# Patient Record
Sex: Female | Born: 1935
Health system: Southern US, Community
[De-identification: ages and names within clinical notes are randomized; demographics above are authoritative.]

## PROBLEM LIST (undated history)

## (undated) DIAGNOSIS — M1990 Unspecified osteoarthritis, unspecified site: Secondary | ICD-10-CM

## (undated) DIAGNOSIS — R32 Unspecified urinary incontinence: Secondary | ICD-10-CM

## (undated) DIAGNOSIS — R112 Nausea with vomiting, unspecified: Secondary | ICD-10-CM

## (undated) DIAGNOSIS — I219 Acute myocardial infarction, unspecified: Secondary | ICD-10-CM

## (undated) DIAGNOSIS — R3915 Urgency of urination: Secondary | ICD-10-CM

## (undated) DIAGNOSIS — I10 Essential (primary) hypertension: Secondary | ICD-10-CM

## (undated) DIAGNOSIS — M2550 Pain in unspecified joint: Secondary | ICD-10-CM

## (undated) DIAGNOSIS — C801 Malignant (primary) neoplasm, unspecified: Secondary | ICD-10-CM

## (undated) DIAGNOSIS — R51 Headache: Secondary | ICD-10-CM

## (undated) DIAGNOSIS — E785 Hyperlipidemia, unspecified: Secondary | ICD-10-CM

## (undated) DIAGNOSIS — Z9889 Other specified postprocedural states: Secondary | ICD-10-CM

## (undated) DIAGNOSIS — J189 Pneumonia, unspecified organism: Secondary | ICD-10-CM

## (undated) DIAGNOSIS — M2540 Effusion, unspecified joint: Secondary | ICD-10-CM

## (undated) DIAGNOSIS — G8929 Other chronic pain: Secondary | ICD-10-CM

## (undated) DIAGNOSIS — A499 Bacterial infection, unspecified: Secondary | ICD-10-CM

## (undated) DIAGNOSIS — R351 Nocturia: Secondary | ICD-10-CM

## (undated) DIAGNOSIS — Z8669 Personal history of other diseases of the nervous system and sense organs: Secondary | ICD-10-CM

## (undated) DIAGNOSIS — R0602 Shortness of breath: Secondary | ICD-10-CM

## (undated) DIAGNOSIS — K219 Gastro-esophageal reflux disease without esophagitis: Secondary | ICD-10-CM

## (undated) DIAGNOSIS — M549 Dorsalgia, unspecified: Secondary | ICD-10-CM

## (undated) DIAGNOSIS — R600 Localized edema: Secondary | ICD-10-CM

## (undated) DIAGNOSIS — R350 Frequency of micturition: Secondary | ICD-10-CM

## (undated) HISTORY — PX: BLEPHAROPLASTY: SUR158

## (undated) HISTORY — DX: Essential (primary) hypertension: I10

## (undated) HISTORY — DX: Hyperlipidemia, unspecified: E78.5

## (undated) HISTORY — DX: Unspecified urinary incontinence: R32

## (undated) HISTORY — PX: TONSILLECTOMY: SUR1361

## (undated) HISTORY — DX: Unspecified osteoarthritis, unspecified site: M19.90

## (undated) HISTORY — DX: Localized edema: R60.0

## (undated) HISTORY — PX: ABDOMINAL HYSTERECTOMY: SHX81

## (undated) HISTORY — PX: KNEE SURGERY: SHX244

---

## 1996-01-08 HISTORY — PX: OTHER SURGICAL HISTORY: SHX169

## 1996-01-08 HISTORY — PX: OOPHORECTOMY: SHX86

## 1997-12-11 ENCOUNTER — Emergency Department (HOSPITAL_COMMUNITY): Admission: EM | Admit: 1997-12-11 | Discharge: 1997-12-11 | Payer: Self-pay | Admitting: Emergency Medicine

## 1999-12-21 ENCOUNTER — Ambulatory Visit (HOSPITAL_COMMUNITY): Admission: RE | Admit: 1999-12-21 | Discharge: 1999-12-21 | Payer: Self-pay | Admitting: *Deleted

## 2000-01-14 ENCOUNTER — Ambulatory Visit (HOSPITAL_COMMUNITY): Admission: RE | Admit: 2000-01-14 | Discharge: 2000-01-14 | Payer: Self-pay | Admitting: *Deleted

## 2000-02-06 ENCOUNTER — Ambulatory Visit (HOSPITAL_BASED_OUTPATIENT_CLINIC_OR_DEPARTMENT_OTHER): Admission: RE | Admit: 2000-02-06 | Discharge: 2000-02-06 | Payer: Self-pay | Admitting: Orthopedic Surgery

## 2000-04-21 ENCOUNTER — Emergency Department (HOSPITAL_COMMUNITY): Admission: EM | Admit: 2000-04-21 | Discharge: 2000-04-21 | Payer: Self-pay | Admitting: Internal Medicine

## 2003-06-23 ENCOUNTER — Emergency Department (HOSPITAL_COMMUNITY): Admission: EM | Admit: 2003-06-23 | Discharge: 2003-06-23 | Payer: Self-pay | Admitting: Emergency Medicine

## 2005-02-10 ENCOUNTER — Encounter: Admission: RE | Admit: 2005-02-10 | Discharge: 2005-02-10 | Payer: Self-pay | Admitting: Orthopedic Surgery

## 2009-10-02 ENCOUNTER — Inpatient Hospital Stay (HOSPITAL_COMMUNITY): Admission: RE | Admit: 2009-10-02 | Discharge: 2009-10-05 | Payer: Self-pay | Admitting: Orthopedic Surgery

## 2009-10-02 HISTORY — PX: JOINT REPLACEMENT: SHX530

## 2009-10-30 ENCOUNTER — Encounter
Admission: RE | Admit: 2009-10-30 | Discharge: 2009-12-26 | Payer: Self-pay | Source: Home / Self Care | Attending: Orthopedic Surgery | Admitting: Orthopedic Surgery

## 2010-03-22 LAB — BASIC METABOLIC PANEL
BUN: 11 mg/dL (ref 6–23)
BUN: 8 mg/dL (ref 6–23)
BUN: 9 mg/dL (ref 6–23)
CO2: 28 mEq/L (ref 19–32)
CO2: 32 mEq/L (ref 19–32)
CO2: 32 mEq/L (ref 19–32)
Calcium: 8.1 mg/dL — ABNORMAL LOW (ref 8.4–10.5)
Calcium: 8.4 mg/dL (ref 8.4–10.5)
Calcium: 8.4 mg/dL (ref 8.4–10.5)
Chloride: 101 mEq/L (ref 96–112)
Chloride: 92 mEq/L — ABNORMAL LOW (ref 96–112)
Chloride: 95 mEq/L — ABNORMAL LOW (ref 96–112)
Creatinine, Ser: 0.97 mg/dL (ref 0.4–1.2)
Creatinine, Ser: 1.07 mg/dL (ref 0.4–1.2)
Creatinine, Ser: 1.15 mg/dL (ref 0.4–1.2)
GFR calc Af Amer: 56 mL/min — ABNORMAL LOW (ref >60)
GFR calc Af Amer: >60 mL/min (ref >60)
GFR calc Af Amer: >60 mL/min (ref >60)
GFR calc non Af Amer: 46 mL/min — ABNORMAL LOW (ref >60)
GFR calc non Af Amer: 50 mL/min — ABNORMAL LOW (ref >60)
GFR calc non Af Amer: 56 mL/min — ABNORMAL LOW (ref >60)
Glucose, Bld: 105 mg/dL — ABNORMAL HIGH (ref 70–99)
Glucose, Bld: 110 mg/dL — ABNORMAL HIGH (ref 70–99)
Glucose, Bld: 119 mg/dL — ABNORMAL HIGH (ref 70–99)
Potassium: 3.4 mEq/L — ABNORMAL LOW (ref 3.5–5.1)
Potassium: 3.5 mEq/L (ref 3.5–5.1)
Potassium: 3.7 mEq/L (ref 3.5–5.1)
Sodium: 131 mEq/L — ABNORMAL LOW (ref 135–145)
Sodium: 134 mEq/L — ABNORMAL LOW (ref 135–145)
Sodium: 136 mEq/L (ref 135–145)

## 2010-03-22 LAB — URINE CULTURE
Colony Count: NO GROWTH
Culture  Setup Time: 201109210956
Culture: NO GROWTH

## 2010-03-22 LAB — DIFFERENTIAL
Basophils Absolute: 0 10*3/uL (ref 0.0–0.1)
Basophils Relative: 0 % (ref 0–1)
Eosinophils Absolute: 0.2 10*3/uL (ref 0.0–0.7)
Eosinophils Relative: 2 % (ref 0–5)
Lymphocytes Relative: 17 % (ref 12–46)
Lymphs Abs: 1.8 10*3/uL (ref 0.7–4.0)
Monocytes Absolute: 0.7 10*3/uL (ref 0.1–1.0)
Monocytes Relative: 6 % (ref 3–12)
Neutro Abs: 8.2 10*3/uL — ABNORMAL HIGH (ref 1.7–7.7)
Neutrophils Relative %: 75 % (ref 43–77)

## 2010-03-22 LAB — CROSSMATCH
ABO/RH(D): A POS
Antibody Screen: NEGATIVE

## 2010-03-22 LAB — URINALYSIS, ROUTINE W REFLEX MICROSCOPIC
Bilirubin Urine: NEGATIVE
Glucose, UA: NEGATIVE mg/dL
Ketones, ur: NEGATIVE mg/dL
Leukocytes, UA: NEGATIVE
Nitrite: NEGATIVE
Protein, ur: NEGATIVE mg/dL
Specific Gravity, Urine: 1.014 (ref 1.005–1.030)
Urobilinogen, UA: 0.2 mg/dL (ref 0.0–1.0)
pH: 6.5 (ref 5.0–8.0)

## 2010-03-22 LAB — SURGICAL PCR SCREEN
MRSA, PCR: NEGATIVE
Staphylococcus aureus: POSITIVE — AB

## 2010-03-22 LAB — CBC
HCT: 27.5 % — ABNORMAL LOW (ref 36.0–46.0)
HCT: 30.7 % — ABNORMAL LOW (ref 36.0–46.0)
HCT: 30.7 % — ABNORMAL LOW (ref 36.0–46.0)
HCT: 39.7 % (ref 36.0–46.0)
Hemoglobin: 10.1 g/dL — ABNORMAL LOW (ref 12.0–15.0)
Hemoglobin: 10.2 g/dL — ABNORMAL LOW (ref 12.0–15.0)
Hemoglobin: 13.2 g/dL (ref 12.0–15.0)
Hemoglobin: 9.2 g/dL — ABNORMAL LOW (ref 12.0–15.0)
MCH: 29.3 pg (ref 26.0–34.0)
MCH: 30.2 pg (ref 26.0–34.0)
MCH: 30.3 pg (ref 26.0–34.0)
MCH: 30.4 pg (ref 26.0–34.0)
MCHC: 32.9 g/dL (ref 30.0–36.0)
MCHC: 33.2 g/dL (ref 30.0–36.0)
MCHC: 33.2 g/dL (ref 30.0–36.0)
MCHC: 33.5 g/dL (ref 30.0–36.0)
MCV: 89 fL (ref 78.0–100.0)
MCV: 90.8 fL (ref 78.0–100.0)
MCV: 90.8 fL (ref 78.0–100.0)
MCV: 91.1 fL (ref 78.0–100.0)
Platelets: 203 10*3/uL (ref 150–400)
Platelets: 216 10*3/uL (ref 150–400)
Platelets: 237 10*3/uL (ref 150–400)
Platelets: 323 10*3/uL (ref 150–400)
RBC: 3.03 MIL/uL — ABNORMAL LOW (ref 3.87–5.11)
RBC: 3.38 MIL/uL — ABNORMAL LOW (ref 3.87–5.11)
RBC: 3.45 MIL/uL — ABNORMAL LOW (ref 3.87–5.11)
RBC: 4.36 MIL/uL (ref 3.87–5.11)
RDW: 13.3 % (ref 11.5–15.5)
RDW: 13.4 % (ref 11.5–15.5)
RDW: 13.5 % (ref 11.5–15.5)
RDW: 13.5 % (ref 11.5–15.5)
WBC: 10.9 10*3/uL — ABNORMAL HIGH (ref 4.0–10.5)
WBC: 12.9 10*3/uL — ABNORMAL HIGH (ref 4.0–10.5)
WBC: 13.1 10*3/uL — ABNORMAL HIGH (ref 4.0–10.5)
WBC: 14.8 10*3/uL — ABNORMAL HIGH (ref 4.0–10.5)

## 2010-03-22 LAB — COMPREHENSIVE METABOLIC PANEL
ALT: 17 U/L (ref 0–35)
AST: 20 U/L (ref 0–37)
Albumin: 3.7 g/dL (ref 3.5–5.2)
Alkaline Phosphatase: 98 U/L (ref 39–117)
BUN: 19 mg/dL (ref 6–23)
CO2: 27 mEq/L (ref 19–32)
Calcium: 9.2 mg/dL (ref 8.4–10.5)
Chloride: 104 mEq/L (ref 96–112)
Creatinine, Ser: 1.07 mg/dL (ref 0.4–1.2)
GFR calc Af Amer: >60 mL/min (ref >60)
GFR calc non Af Amer: 50 mL/min — ABNORMAL LOW (ref >60)
Glucose, Bld: 88 mg/dL (ref 70–99)
Potassium: 4.4 mEq/L (ref 3.5–5.1)
Sodium: 137 mEq/L (ref 135–145)
Total Bilirubin: 0.5 mg/dL (ref 0.3–1.2)
Total Protein: 7.1 g/dL (ref 6.0–8.3)

## 2010-03-22 LAB — APTT: aPTT: 30 seconds (ref 24–37)

## 2010-03-22 LAB — URINE MICROSCOPIC-ADD ON

## 2010-03-22 LAB — ABO/RH: ABO/RH(D): A POS

## 2010-03-22 LAB — PROTIME-INR
INR: 1 (ref 0.00–1.49)
Prothrombin Time: 13.4 seconds (ref 11.6–15.2)

## 2010-05-25 NOTE — Op Note (Signed)
[place]  Patient:    Kristen Manning, Kristen Manning                     MRN: [id number] Proc. Date: 02/06/00 Adm. Date:  [id number] Attending:  Georgena Spurling                           Operative Report  PREOPERATIVE DIAGNOSIS:  Right knee degenerative joint disease, medial and lateral meniscal tears.  POSTOPERATIVE DIAGNOSIS:  Right knee degenerative joint disease, medial and lateral meniscal tears.  OPERATION PERFORMED:  Right knee partial medial meniscectomy, partial lateral meniscectomy; medial, lateral and patellofemoral chondroplasty and medial plica debridement.  SURGEON:  Georgena Spurling, M.D.  ASSISTANT:  None.  ANESTHESIA:  Knee block.  INDICATIONS FOR PROCEDURE:  The patient is a 77 year old with mechanical symptoms and MRI evidence of medial and lateral meniscal tears as well as x-ray evidence of degenerative joint disease.  After informed consent was obtained, she was taken to the operating room.  DESCRIPTION OF PROCEDURE:  The patient was laid supine after being given a right knee block in the preanesthesia holding area.  The right lower extremity was prepped and draped in the usual sterile fashion.  A #11 blade, blunt trocar and cannula were used to create inferior lateral and inferior medial portals.  A diagnostic arthroscopy was performed.  She had grade 4 chondromalacia in the trochlea and medial femoral condyle, medial tibial plateau and lateral tibial plateau.  She had grade 3 chondromalacia on the patella, lateral femoral condyle.  She had a normal ACL.  She had a very large flap tear of the anterior horn of the lateral meniscus sitting in the intercondylar notch.  She also had a complex posterior horn medial meniscus tear.  The basket forceps used to perform a partial lateral and partial medial meniscectomy and the great white shaver was used to remove the debris.  We then went into the patellofemoral joint where chondroplasty was  done on the trochlea and the patella.  The medial patellofemoral plica which was hypertrophied, synovitic and in between the patella and trochlea was debrided. We then performed a chondroplasty on the medial femoral condyle, the medial tibial plateau and lateral tibial plateau.  We then lavaged the knee, did one further diagnostic arthroscopy to ensure there were no loose bodies and then evacuated the knee joint.  We closed with interrupted 4-0 nylon stitches, dressed with Xeroform dressing sponges, sterile Webril and Ace wrap.  TOURNIQUET TIME:  None.  DRAINS:  None.  COMPLICATIONS:  None. DD:  02/06/00 TD:  02/06/00 Job: [id number]

## 2011-07-03 ENCOUNTER — Ambulatory Visit (INDEPENDENT_AMBULATORY_CARE_PROVIDER_SITE_OTHER): Payer: Medicare Other | Admitting: Family Medicine

## 2011-07-03 ENCOUNTER — Encounter: Payer: Self-pay | Admitting: Family Medicine

## 2011-07-03 VITALS — BP 132/82 | HR 86 | Temp 98.2°F | Ht 63.5 in | Wt 205.8 lb

## 2011-07-03 DIAGNOSIS — I872 Venous insufficiency (chronic) (peripheral): Secondary | ICD-10-CM

## 2011-07-03 DIAGNOSIS — I10 Essential (primary) hypertension: Secondary | ICD-10-CM | POA: Insufficient documentation

## 2011-07-03 DIAGNOSIS — E785 Hyperlipidemia, unspecified: Secondary | ICD-10-CM

## 2011-07-03 LAB — HEPATIC FUNCTION PANEL
ALT: 21 U/L (ref 0–35)
AST: 24 U/L (ref 0–37)
Albumin: 3.8 g/dL (ref 3.5–5.2)
Alkaline Phosphatase: 102 U/L (ref 39–117)
Bilirubin, Direct: 0 mg/dL (ref 0.0–0.3)
Total Bilirubin: 0.6 mg/dL (ref 0.3–1.2)
Total Protein: 7.3 g/dL (ref 6.0–8.3)

## 2011-07-03 LAB — CBC WITH DIFFERENTIAL/PLATELET
Basophils Absolute: 0 10*3/uL (ref 0.0–0.1)
Basophils Relative: 0.5 % (ref 0.0–3.0)
Eosinophils Absolute: 0.2 10*3/uL (ref 0.0–0.7)
Eosinophils Relative: 1.9 % (ref 0.0–5.0)
HCT: 37.5 % (ref 36.0–46.0)
Hemoglobin: 12.6 g/dL (ref 12.0–15.0)
Lymphocytes Relative: 18.7 % (ref 12.0–46.0)
Lymphs Abs: 1.7 10*3/uL (ref 0.7–4.0)
MCHC: 33.5 g/dL (ref 30.0–36.0)
MCV: 90.8 fl (ref 78.0–100.0)
Monocytes Absolute: 0.6 10*3/uL (ref 0.1–1.0)
Monocytes Relative: 6.8 % (ref 3.0–12.0)
Neutro Abs: 6.6 10*3/uL (ref 1.4–7.7)
Neutrophils Relative %: 72.1 % (ref 43.0–77.0)
Platelets: 292 10*3/uL (ref 150.0–400.0)
RBC: 4.14 Mil/uL (ref 3.87–5.11)
RDW: 13.1 % (ref 11.5–14.6)
WBC: 9.2 10*3/uL (ref 4.5–10.5)

## 2011-07-03 LAB — LDL CHOLESTEROL, DIRECT: Direct LDL: 126.1 mg/dL

## 2011-07-03 LAB — LIPID PANEL
Cholesterol: 225 mg/dL — ABNORMAL HIGH (ref 0–200)
HDL: 57.1 mg/dL (ref >39.00)
Total CHOL/HDL Ratio: 4
Triglycerides: 179 mg/dL — ABNORMAL HIGH (ref 0.0–149.0)
VLDL: 35.8 mg/dL (ref 0.0–40.0)

## 2011-07-03 LAB — BASIC METABOLIC PANEL
BUN: 17 mg/dL (ref 6–23)
CO2: 27 mEq/L (ref 19–32)
Calcium: 9.1 mg/dL (ref 8.4–10.5)
Chloride: 105 mEq/L (ref 96–112)
Creatinine, Ser: 1 mg/dL (ref 0.4–1.2)
GFR: 54.65 mL/min — ABNORMAL LOW (ref >60.00)
Glucose, Bld: 82 mg/dL (ref 70–99)
Potassium: 4.8 mEq/L (ref 3.5–5.1)
Sodium: 140 mEq/L (ref 135–145)

## 2011-07-03 LAB — TSH: TSH: 3.22 u[IU]/mL (ref 0.35–5.50)

## 2011-07-03 NOTE — Progress Notes (Signed)
  Subjective:    Patient ID: Kristen Manning, female    DOB: [birth year], 78 y.o.   MRN: [id number]  HPI New to establish.  Previous MD- [place], [facility]  HTN- chronic problem, on HCTZ.  Well controlled.  No CP, SOB, HAs, visual changes.  + edema.  Edema- chronic problem.  Has been told in the past that it's not fluid it's poor venous return w/ blood pooling in her legs.  At times will have difficulty walking due to pain and size of feet and ankles.  Has taken some of brother's lasix w/out much relief.  Has not recently worn compression socks.  Has never seen vascular.  Swelling will improve w/ elevation.  Hyperlipidemia- pt reports hx of this but has not had recent lab work.  Has never been on meds for this.  Health Maintenance- no recent mammo, bone density, has never had colonoscopy (has been setup for this but never went)   Review of Systems For ROS see HPI     Objective:   Physical Exam  Constitutional: She is oriented to person, place, and time. She appears well-developed and well-nourished. No distress.  HENT:  Head: Normocephalic and atraumatic.  Eyes: Conjunctivae and EOM are normal. Pupils are equal, round, and reactive to light.  Neck: Normal range of motion. Neck supple. No thyromegaly present.  Cardiovascular: Normal rate, regular rhythm, normal heart sounds and intact distal pulses.   No murmur heard. Pulmonary/Chest: Effort normal and breath sounds normal. No respiratory distress.  Abdominal: Soft. She exhibits no distension. There is no tenderness.  Musculoskeletal: She exhibits edema (trace, nonpitting edema bilaterally).  Lymphadenopathy:    She has no cervical adenopathy.  Neurological: She is alert and oriented to person, place, and time.  Skin: Skin is warm and dry.  Psychiatric: She has a normal mood and affect. Her behavior is normal.          Assessment & Plan:

## 2011-07-03 NOTE — Patient Instructions (Addendum)
Schedule your complete physical at your convenience We'll notify you of your lab results Use the compression socks to improve your swelling Elevate your legs as much as possible Call with any questions or concerns Welcome!  We're glad to have you!

## 2011-07-07 NOTE — Assessment & Plan Note (Signed)
New to provider.  Chronic for pt.  Adequate control.  Asymptomatic w/ exception of LE edema.  No changes.  Reviewed supportive care and red flags that should prompt return.  Pt expressed understanding and is in agreement w/ plan.

## 2011-07-07 NOTE — Assessment & Plan Note (Signed)
New to provider.  Chronic for pt.  Has not had recent lab work and not currently on meds.  Check labs and start meds prn.

## 2011-07-07 NOTE — Assessment & Plan Note (Signed)
New to provider.  Chronic for pt.  Script given for knee high compression socks.  Will follow.

## 2011-08-30 ENCOUNTER — Encounter: Payer: Medicare Other | Admitting: Family Medicine

## 2011-09-16 ENCOUNTER — Telehealth: Payer: Self-pay | Admitting: Family Medicine

## 2011-09-16 MED ORDER — HYDROCHLOROTHIAZIDE 25 MG PO TABS: 25.0000 mg | ORAL_TABLET | Freq: Every day | ORAL | Status: DC

## 2011-09-16 NOTE — Telephone Encounter (Signed)
Refill:  Hctz 25mg  tab. Take 1 tablet by mouth once daily. Qty 30. Last fill 8.3.13

## 2011-09-16 NOTE — Telephone Encounter (Signed)
rx sent to pharmacy by e-script  

## 2011-09-19 ENCOUNTER — Ambulatory Visit (INDEPENDENT_AMBULATORY_CARE_PROVIDER_SITE_OTHER): Payer: Medicare Other | Admitting: Family Medicine

## 2011-09-19 ENCOUNTER — Encounter: Payer: Self-pay | Admitting: Family Medicine

## 2011-09-19 VITALS — BP 125/78 | HR 90 | Temp 98.3°F | Ht 63.0 in | Wt 196.2 lb

## 2011-09-19 DIAGNOSIS — J329 Chronic sinusitis, unspecified: Secondary | ICD-10-CM | POA: Insufficient documentation

## 2011-09-19 DIAGNOSIS — J019 Acute sinusitis, unspecified: Secondary | ICD-10-CM | POA: Insufficient documentation

## 2011-09-19 MED ORDER — AMOXICILLIN 875 MG PO TABS: 875.0000 mg | ORAL_TABLET | Freq: Two times a day (BID) | ORAL | Status: DC

## 2011-09-19 NOTE — Progress Notes (Signed)
  Subjective:    Patient ID: Kristen Manning, female    DOB: [birth year], 77 y.o.   MRN: [id number]  HPI Cough- sxs started 1 week ago.  Now having L sided facial pain, L ear pain, sore throat, +PND.  Cough is intermittently productive.  No fevers.  No known sick contacts.  No N/V/D.  No SOB.     Review of Systems For ROS see HPI     Objective:   Physical Exam  Vitals reviewed. Constitutional: She appears well-developed and well-nourished. No distress.  HENT:  Head: Normocephalic and atraumatic.  Right Ear: Tympanic membrane normal.  Left Ear: Tympanic membrane normal.  Nose: Mucosal edema and rhinorrhea present. Right sinus exhibits no maxillary sinus tenderness and no frontal sinus tenderness. Left sinus exhibits maxillary sinus tenderness and frontal sinus tenderness.  Mouth/Throat: Uvula is midline and mucous membranes are normal. Posterior oropharyngeal erythema present. No oropharyngeal exudate.  Eyes: Conjunctivae normal and EOM are normal. Pupils are equal, round, and reactive to light.  Neck: Normal range of motion. Neck supple.  Cardiovascular: Normal rate, regular rhythm and normal heart sounds.   Pulmonary/Chest: Effort normal and breath sounds normal. No respiratory distress. She has no wheezes.  Lymphadenopathy:    She has no cervical adenopathy.          Assessment & Plan:

## 2011-09-19 NOTE — Patient Instructions (Addendum)
This is a sinus infection Start the Amoxicillin twice daily- take w/ food Add Mucinex to thin your congestion Drink plenty of fluids Hang in there!!! Happy early birthday!!!

## 2011-09-19 NOTE — Assessment & Plan Note (Signed)
New.  Pt's sxs and PE consistent w/ infxn.  Start abx.  Reviewed supportive care and red flags that should prompt return.  Pt expressed understanding and is in agreement w/ plan.  

## 2011-10-17 ENCOUNTER — Ambulatory Visit (INDEPENDENT_AMBULATORY_CARE_PROVIDER_SITE_OTHER): Payer: Medicare Other | Admitting: Family Medicine

## 2011-10-17 ENCOUNTER — Encounter: Payer: Self-pay | Admitting: Family Medicine

## 2011-10-17 VITALS — BP 130/85 | HR 86 | Temp 98.2°F | Ht 63.0 in | Wt 201.1 lb

## 2011-10-17 DIAGNOSIS — E785 Hyperlipidemia, unspecified: Secondary | ICD-10-CM

## 2011-10-17 DIAGNOSIS — Z1231 Encounter for screening mammogram for malignant neoplasm of breast: Secondary | ICD-10-CM

## 2011-10-17 DIAGNOSIS — I10 Essential (primary) hypertension: Secondary | ICD-10-CM

## 2011-10-17 DIAGNOSIS — Z780 Asymptomatic menopausal state: Secondary | ICD-10-CM

## 2011-10-17 DIAGNOSIS — Z0000 Encounter for general adult medical examination without abnormal findings: Secondary | ICD-10-CM | POA: Insufficient documentation

## 2011-10-17 LAB — BASIC METABOLIC PANEL
BUN: 19 mg/dL (ref 6–23)
CO2: 27 mEq/L (ref 19–32)
Calcium: 9.2 mg/dL (ref 8.4–10.5)
Chloride: 99 mEq/L (ref 96–112)
Creatinine, Ser: 1.1 mg/dL (ref 0.4–1.2)
GFR: 52.85 mL/min — ABNORMAL LOW (ref >60.00)
Glucose, Bld: 77 mg/dL (ref 70–99)
Potassium: 3.9 mEq/L (ref 3.5–5.1)
Sodium: 135 mEq/L (ref 135–145)

## 2011-10-17 LAB — CBC WITH DIFFERENTIAL/PLATELET
Basophils Absolute: 0 10*3/uL (ref 0.0–0.1)
Basophils Relative: 0.4 % (ref 0.0–3.0)
Eosinophils Absolute: 0.1 10*3/uL (ref 0.0–0.7)
Eosinophils Relative: 1.5 % (ref 0.0–5.0)
HCT: 41.7 % (ref 36.0–46.0)
Hemoglobin: 13.6 g/dL (ref 12.0–15.0)
Lymphocytes Relative: 16.9 % (ref 12.0–46.0)
Lymphs Abs: 1.3 10*3/uL (ref 0.7–4.0)
MCHC: 32.6 g/dL (ref 30.0–36.0)
MCV: 90.7 fl (ref 78.0–100.0)
Monocytes Absolute: 0.6 10*3/uL (ref 0.1–1.0)
Monocytes Relative: 7.5 % (ref 3.0–12.0)
Neutro Abs: 5.9 10*3/uL (ref 1.4–7.7)
Neutrophils Relative %: 73.7 % (ref 43.0–77.0)
Platelets: 333 10*3/uL (ref 150.0–400.0)
RBC: 4.6 Mil/uL (ref 3.87–5.11)
RDW: 13.9 % (ref 11.5–14.6)
WBC: 8 10*3/uL (ref 4.5–10.5)

## 2011-10-17 LAB — HEPATIC FUNCTION PANEL
ALT: 19 U/L (ref 0–35)
AST: 20 U/L (ref 0–37)
Albumin: 3.7 g/dL (ref 3.5–5.2)
Alkaline Phosphatase: 103 U/L (ref 39–117)
Bilirubin, Direct: 0 mg/dL (ref 0.0–0.3)
Total Bilirubin: 0.5 mg/dL (ref 0.3–1.2)
Total Protein: 7.9 g/dL (ref 6.0–8.3)

## 2011-10-17 LAB — LIPID PANEL
Cholesterol: 252 mg/dL — ABNORMAL HIGH (ref 0–200)
HDL: 44.4 mg/dL (ref >39.00)
Total CHOL/HDL Ratio: 6
Triglycerides: 173 mg/dL — ABNORMAL HIGH (ref 0.0–149.0)
VLDL: 34.6 mg/dL (ref 0.0–40.0)

## 2011-10-17 LAB — TSH: TSH: 3.5 u[IU]/mL (ref 0.35–5.50)

## 2011-10-17 LAB — LDL CHOLESTEROL, DIRECT: Direct LDL: 174.6 mg/dL

## 2011-10-17 NOTE — Progress Notes (Signed)
  Subjective:    Patient ID: Kristen Manning, female    DOB: [birth year], 78 y.o.   MRN: [id number]  HPI Here today for CPE.  Risk Factors: HTN- chronic problem, adequate control on HCTZ.   Hyperlipidemia- chronic problem, currently on fish oil.  Due for repeat labs. Physical Activity:  Walking regularly Fall Risk: low risk, steady on feet Depression:  No current sxs Hearing: has 60% hearing loss in L ear and 30% in R ear according to audiology eval ADL's: independent Cognitive: normal linear thought process, memory and attention intact Home Safety: safe at home Height, Weight, BMI, Visual Acuity: see vitals, vision corrected to 20/20 w/ glasses Counseling: overdue on mammo (10 yrs), overdue on DEXA (5 yrs), no longer having paps, not interested in colonoscopy- 'i don't want one'. Labs Ordered: See A&P Care Plan: See A&P    Review of Systems Patient reports no vision/ hearing changes, adenopathy,fever, weight change,  persistant/recurrent hoarseness , swallowing issues, chest pain, palpitations, edema, persistant/recurrent cough, hemoptysis, dyspnea (rest/exertional/paroxysmal nocturnal), gastrointestinal bleeding (melena, rectal bleeding), abdominal pain, significant heartburn, bowel changes, GU symptoms (dysuria, hematuria, incontinence), Gyn symptoms (abnormal  bleeding, pain),  syncope, focal weakness, memory loss, numbness & tingling, skin/hair/nail changes, abnormal bruising or bleeding, anxiety, or depression.     Objective:   Physical Exam  General Appearance:    Alert, cooperative, no distress, appears stated age  Head:    Normocephalic, without obvious abnormality, atraumatic  Eyes:    PERRL, conjunctiva/corneas clear, EOM's intact, fundi    benign, both eyes  Ears:    Normal TM's and external ear canals, both ears  Nose:   Nares normal, septum midline, mucosa normal, no drainage    or sinus tenderness  Throat:   Lips, mucosa, and tongue normal; teeth and gums normal    Neck:   Supple, symmetrical, trachea midline, no adenopathy;    Thyroid: no enlargement/tenderness/nodules  Back:     Symmetric, no curvature, ROM normal, no CVA tenderness  Lungs:     Clear to auscultation bilaterally, respirations unlabored  Chest Wall:    No tenderness or deformity   Heart:    Regular rate and rhythm, S1 and S2 normal, no murmur, rub   or gallop  Breast Exam:    No tenderness, masses, or nipple abnormality  Abdomen:     Soft, non-tender, bowel sounds active all four quadrants,    no masses, no organomegaly  Genitalia:    Deferred at pt's request  Rectal:    Extremities:   Extremities normal, atraumatic, no cyanosis or edema  Pulses:   2+ and symmetric all extremities  Skin:   Skin color, texture, turgor normal, no rashes or lesions  Lymph nodes:   Cervical, supraclavicular, and axillary nodes normal  Neurologic:   CNII-XII intact, normal strength, sensation and reflexes    throughout          Assessment & Plan:

## 2011-10-17 NOTE — Patient Instructions (Addendum)
Follow up in 6 months to recheck BP and cholesterol- sooner if needed Keep up the good work- you look good! Call with any questions or concerns Happy Belated Birthday!

## 2011-10-21 LAB — VITAMIN D 1,25 DIHYDROXY
Vitamin D 1, 25 (OH)2 Total: 48 pg/mL (ref 18–72)
Vitamin D2 1, 25 (OH)2: <8 pg/mL
Vitamin D3 1, 25 (OH)2: 48 pg/mL

## 2011-10-22 ENCOUNTER — Encounter: Payer: Self-pay | Admitting: *Deleted

## 2011-10-24 ENCOUNTER — Telehealth: Payer: Self-pay

## 2011-10-24 MED ORDER — ATORVASTATIN CALCIUM 20 MG PO TABS: 20.0000 mg | ORAL_TABLET | Freq: Every day | ORAL | Status: DC

## 2011-10-24 NOTE — Telephone Encounter (Signed)
Spoke to pt advising lab results, sent lipitor Rx in to pharmacy per lab results, and scheduled lab recheck LFTs 12/12/11 8am. Pt stated understanding.     MW

## 2011-10-29 NOTE — Assessment & Plan Note (Signed)
Chronic problem.  Adequate control.  Asymptomatic.  No changes anticipated pending lab results

## 2011-10-29 NOTE — Assessment & Plan Note (Signed)
Chronic problem.  Pt has been resistant to starting statin in the past.  Taking fish oil.  Check labs and start meds prn.

## 2011-10-29 NOTE — Assessment & Plan Note (Signed)
Pt's PE WNL.  EKG done- see document for interpretation.  Will refer for DEXA and mammo.  Pt refusing colonoscopy.  Check labs.  Anticipatory guidance provided.

## 2011-11-11 ENCOUNTER — Telehealth: Payer: Self-pay

## 2011-11-11 MED ORDER — SIMVASTATIN 40 MG PO TABS: 40.0000 mg | ORAL_TABLET | Freq: Every day | ORAL | Status: DC

## 2011-11-11 NOTE — Telephone Encounter (Signed)
Pt states she seen on tv Lipitor turns you into a diabetic and also WXII  News said something concerning side effects of Lipitor too. Pt states wants to be put on another med. PLz advise   MW

## 2011-11-11 NOTE — Telephone Encounter (Signed)
Medication sent to pharmacy  

## 2011-11-11 NOTE — Telephone Encounter (Signed)
This is not a common problem but if pt would like to switch she can start Zocor 40mg  (it's not as strong as lipitor and will require higher dose for same effect)

## 2011-11-21 ENCOUNTER — Ambulatory Visit: Payer: Medicare Other

## 2011-11-21 ENCOUNTER — Other Ambulatory Visit: Payer: Medicare Other

## 2011-12-12 ENCOUNTER — Other Ambulatory Visit: Payer: Medicare Other

## 2011-12-18 ENCOUNTER — Other Ambulatory Visit: Payer: Medicare Other

## 2011-12-23 ENCOUNTER — Other Ambulatory Visit (INDEPENDENT_AMBULATORY_CARE_PROVIDER_SITE_OTHER): Payer: Medicare Other

## 2011-12-23 ENCOUNTER — Encounter: Payer: Self-pay | Admitting: *Deleted

## 2011-12-23 DIAGNOSIS — R7401 Elevation of levels of liver transaminase levels: Secondary | ICD-10-CM

## 2011-12-23 DIAGNOSIS — R748 Abnormal levels of other serum enzymes: Secondary | ICD-10-CM

## 2011-12-23 LAB — HEPATIC FUNCTION PANEL
ALT: 19 U/L (ref 0–35)
AST: 20 U/L (ref 0–37)
Albumin: 3.9 g/dL (ref 3.5–5.2)
Alkaline Phosphatase: 99 U/L (ref 39–117)
Bilirubin, Direct: 0 mg/dL (ref 0.0–0.3)
Total Bilirubin: 0.7 mg/dL (ref 0.3–1.2)
Total Protein: 7.8 g/dL (ref 6.0–8.3)

## 2011-12-27 ENCOUNTER — Ambulatory Visit: Payer: Medicare Other

## 2011-12-27 ENCOUNTER — Other Ambulatory Visit: Payer: Medicare Other

## 2011-12-31 ENCOUNTER — Telehealth: Payer: Self-pay | Admitting: *Deleted

## 2011-12-31 MED ORDER — EZETIMIBE 10 MG PO TABS: 10.0000 mg | ORAL_TABLET | Freq: Every day | ORAL | Status: DC

## 2011-12-31 NOTE — Telephone Encounter (Signed)
Please provide samples of Zetia x1 month if available.  Ok for #30, 3 refills prescription.  Recheck lipids in 3 months

## 2011-12-31 NOTE — Telephone Encounter (Signed)
Pt has D/C zocor due to muscle ache and fatigue. Per Labs done 12-23-11 Pt to continue with cholesterol med but she is not currently taking anything.Please advise

## 2011-12-31 NOTE — Telephone Encounter (Signed)
Discuss with patient, Samples given and Rx sent to pharmacy

## 2012-01-16 ENCOUNTER — Telehealth: Payer: Self-pay | Admitting: Family Medicine

## 2012-01-16 NOTE — Telephone Encounter (Signed)
Please advise on request for bloodwork.  Pt had last LFT on :12-23-11(nrl).//AB/CMA

## 2012-01-16 NOTE — Telephone Encounter (Signed)
LM @ (5:12pm) asking the pt to RTC.//AB/CMA 

## 2012-01-16 NOTE — Telephone Encounter (Signed)
pt would like to have blood work done due to new meds prescribed for Cholesterol no orders are in last LFT was good please review and advise and I can call patient back at #[phone]

## 2012-01-16 NOTE — Telephone Encounter (Signed)
Zetia does not need liver functions or blood work

## 2012-01-17 NOTE — Telephone Encounter (Signed)
Spoke with the pt and informed her that the Zetia does not need liver functions or blood work.  Pt agreed.//AB/CMA

## 2012-01-31 ENCOUNTER — Other Ambulatory Visit: Payer: Medicare Other

## 2012-01-31 ENCOUNTER — Ambulatory Visit: Payer: Medicare Other

## 2012-02-10 ENCOUNTER — Ambulatory Visit (INDEPENDENT_AMBULATORY_CARE_PROVIDER_SITE_OTHER): Payer: Medicare Other | Admitting: Family Medicine

## 2012-02-10 ENCOUNTER — Encounter: Payer: Self-pay | Admitting: Family Medicine

## 2012-02-10 VITALS — BP 128/70 | HR 76 | Temp 98.2°F | Ht 64.0 in | Wt 202.0 lb

## 2012-02-10 DIAGNOSIS — J329 Chronic sinusitis, unspecified: Secondary | ICD-10-CM

## 2012-02-10 MED ORDER — AMOXICILLIN 875 MG PO TABS: 875.0000 mg | ORAL_TABLET | Freq: Two times a day (BID) | ORAL | Status: DC

## 2012-02-10 NOTE — Progress Notes (Signed)
  Subjective:    Patient ID: Kristen Manning, female    DOB: 02/16/1932, 79 y.o.   MRN: [id number]  HPI URI- sxs started yesterday AM.  + facial pain/pressure, L>R.  + PND, nasal congestion.  L ear pain.  No N/V/D.  No fevers.  No cough.  No known sick contacts.  Hx of recurrent sinus infxns.   Review of Systems For ROS see HPI     Objective:   Physical Exam  Constitutional: She appears well-developed and well-nourished. No distress.  HENT:  Head: Normocephalic and atraumatic.  Right Ear: Tympanic membrane normal.  Left Ear: Tympanic membrane normal.  Nose: Mucosal edema and rhinorrhea present. Right sinus exhibits maxillary sinus tenderness and frontal sinus tenderness. Left sinus exhibits maxillary sinus tenderness and frontal sinus tenderness.  Mouth/Throat: Uvula is midline and mucous membranes are normal. Posterior oropharyngeal erythema present. No oropharyngeal exudate.  Eyes: Conjunctivae normal and EOM are normal. Pupils are equal, round, and reactive to light.  Neck: Normal range of motion. Neck supple.  Cardiovascular: Normal rate, regular rhythm and normal heart sounds.   Pulmonary/Chest: Effort normal and breath sounds normal. No respiratory distress. She has no wheezes.  Lymphadenopathy:    She has no cervical adenopathy.          Assessment & Plan:

## 2012-02-10 NOTE — Assessment & Plan Note (Signed)
Recurrent.  Pt's sxs and PE consistent w/ infxn.  Start abx.  Reviewed supportive care and red flags that should prompt return.  Pt expressed understanding and is in agreement w/ plan.

## 2012-02-10 NOTE — Patient Instructions (Addendum)
This is a sinus infection Start the Amoxicillin twice daily- take w/ food Drink plenty of fluids Add Mucinex to thin your congestion and drainage REST! Call with any questions or concerns Hang in there!!!

## 2012-03-02 ENCOUNTER — Ambulatory Visit: Payer: Medicare Other

## 2012-03-02 ENCOUNTER — Other Ambulatory Visit: Payer: Medicare Other

## 2012-03-19 ENCOUNTER — Telehealth: Payer: Self-pay | Admitting: Family Medicine

## 2012-03-19 NOTE — Telephone Encounter (Signed)
refill Hydrochlorothiazide (Tab) 25 MG Take 1 tablet (25 mg total) by mouth daily. #30 wt/5-refills last fill 2.6.14

## 2012-03-20 ENCOUNTER — Other Ambulatory Visit: Payer: Self-pay | Admitting: *Deleted

## 2012-03-20 DIAGNOSIS — I10 Essential (primary) hypertension: Secondary | ICD-10-CM

## 2012-03-20 MED ORDER — HYDROCHLOROTHIAZIDE 25 MG PO TABS: 25.0000 mg | ORAL_TABLET | Freq: Every day | ORAL | Status: DC

## 2012-03-20 NOTE — Telephone Encounter (Signed)
Patient called triage requesting refill on HCTZ. Refill sent to [facility]

## 2012-03-20 NOTE — Telephone Encounter (Signed)
Rx sent by Ricarda Frame

## 2012-03-31 ENCOUNTER — Ambulatory Visit
Admission: RE | Admit: 2012-03-31 | Discharge: 2012-03-31 | Disposition: A | Discharge status: Home / Self Care | Payer: Medicare Other | Source: Ambulatory Visit | Attending: Family Medicine | Admitting: Family Medicine

## 2012-03-31 DIAGNOSIS — Z1231 Encounter for screening mammogram for malignant neoplasm of breast: Secondary | ICD-10-CM

## 2012-03-31 DIAGNOSIS — Z780 Asymptomatic menopausal state: Secondary | ICD-10-CM

## 2012-04-21 ENCOUNTER — Ambulatory Visit: Payer: Medicare Other | Admitting: Family Medicine

## 2012-04-27 ENCOUNTER — Ambulatory Visit: Payer: Medicare Other | Admitting: Family Medicine

## 2012-05-02 ENCOUNTER — Other Ambulatory Visit: Payer: Self-pay | Admitting: Family Medicine

## 2012-05-04 NOTE — Telephone Encounter (Signed)
Med filled.  

## 2012-05-11 ENCOUNTER — Ambulatory Visit (INDEPENDENT_AMBULATORY_CARE_PROVIDER_SITE_OTHER): Payer: Medicare Other | Admitting: Family Medicine

## 2012-05-11 ENCOUNTER — Encounter: Payer: Self-pay | Admitting: Family Medicine

## 2012-05-11 VITALS — BP 130/70 | HR 78 | Temp 97.3°F | Ht 64.0 in | Wt 206.8 lb

## 2012-05-11 DIAGNOSIS — E785 Hyperlipidemia, unspecified: Secondary | ICD-10-CM

## 2012-05-11 DIAGNOSIS — I10 Essential (primary) hypertension: Secondary | ICD-10-CM

## 2012-05-11 LAB — LIPID PANEL
Cholesterol: 197 mg/dL (ref 0–200)
HDL: 60.4 mg/dL (ref >39.00)
LDL Cholesterol: 112 mg/dL — ABNORMAL HIGH (ref 0–99)
Total CHOL/HDL Ratio: 3
Triglycerides: 121 mg/dL (ref 0.0–149.0)
VLDL: 24.2 mg/dL (ref 0.0–40.0)

## 2012-05-11 LAB — HEPATIC FUNCTION PANEL
ALT: 19 U/L (ref 0–35)
AST: 20 U/L (ref 0–37)
Albumin: 4 g/dL (ref 3.5–5.2)
Alkaline Phosphatase: 98 U/L (ref 39–117)
Bilirubin, Direct: 0 mg/dL (ref 0.0–0.3)
Total Bilirubin: 0.7 mg/dL (ref 0.3–1.2)
Total Protein: 7.6 g/dL (ref 6.0–8.3)

## 2012-05-11 LAB — BASIC METABOLIC PANEL
BUN: 17 mg/dL (ref 6–23)
CO2: 28 mEq/L (ref 19–32)
Calcium: 9.4 mg/dL (ref 8.4–10.5)
Chloride: 99 mEq/L (ref 96–112)
Creatinine, Ser: 1.1 mg/dL (ref 0.4–1.2)
GFR: 50.06 mL/min — ABNORMAL LOW (ref >60.00)
Glucose, Bld: 94 mg/dL (ref 70–99)
Potassium: 4.3 mEq/L (ref 3.5–5.1)
Sodium: 135 mEq/L (ref 135–145)

## 2012-05-11 NOTE — Progress Notes (Signed)
  Subjective:    Patient ID: Kristen Manning, female    DOB: [birth year], 79 y.o.   MRN: [id number]  HPI HTN- chronic problem, adequate control today.  On HCTZ.  No CP, HAs, visual changes, edema.  Some SOB when climbing stairs.  Hyperlipidemia- chronic problem.  Pt has been statin intolerant in the past due to myalgias and muscle weakness.  On Zetia.  Denies abd pain, N/V, myalgias.   Review of Systems For ROS see HPI     Objective:   Physical Exam  Vitals reviewed. Constitutional: She is oriented to person, place, and time. She appears well-developed and well-nourished. No distress.  HENT:  Head: Normocephalic and atraumatic.  Eyes: Conjunctivae and EOM are normal. Pupils are equal, round, and reactive to light.  Neck: Normal range of motion. Neck supple. No thyromegaly present.  Cardiovascular: Normal rate, regular rhythm, normal heart sounds and intact distal pulses.   No murmur heard. Pulmonary/Chest: Effort normal and breath sounds normal. No respiratory distress.  Abdominal: Soft. She exhibits no distension. There is no tenderness.  Musculoskeletal: She exhibits edema (bilateral LE, non-pitting).  Lymphadenopathy:    She has no cervical adenopathy.  Neurological: She is alert and oriented to person, place, and time.  Skin: Skin is warm and dry.  Psychiatric: She has a normal mood and affect. Her behavior is normal.          Assessment & Plan:

## 2012-05-11 NOTE — Patient Instructions (Addendum)
Schedule your physical in 6 months We'll notify you of your lab results and make any changes if needed Keep up the good work!  You look great! Happy Spring!

## 2012-05-11 NOTE — Assessment & Plan Note (Signed)
Chronic problem.  Intolerant to statins.  Tolerating zetia w/out difficulty.  Check labs to determine if pt is adequately controlled.

## 2012-05-11 NOTE — Assessment & Plan Note (Signed)
Chronic problem.  Asymptomatic.  Adequate control.  Check labs.  No anticipated changes.

## 2012-05-12 ENCOUNTER — Encounter: Payer: Self-pay | Admitting: *Deleted

## 2012-09-04 ENCOUNTER — Other Ambulatory Visit: Payer: Self-pay | Admitting: Family Medicine

## 2012-09-04 NOTE — Telephone Encounter (Signed)
Med filled.  

## 2012-10-26 ENCOUNTER — Encounter: Payer: Medicare Other | Admitting: Family Medicine

## 2012-11-03 ENCOUNTER — Other Ambulatory Visit: Payer: Self-pay | Admitting: Family Medicine

## 2012-11-03 NOTE — Telephone Encounter (Signed)
Med filled.  

## 2012-11-26 ENCOUNTER — Encounter: Payer: Self-pay | Admitting: Family Medicine

## 2012-11-26 ENCOUNTER — Telehealth: Payer: Self-pay | Admitting: *Deleted

## 2012-11-26 ENCOUNTER — Ambulatory Visit (INDEPENDENT_AMBULATORY_CARE_PROVIDER_SITE_OTHER): Payer: Medicare Other | Admitting: Family Medicine

## 2012-11-26 VITALS — BP 128/74 | HR 80 | Temp 98.1°F | Resp 16 | Ht 63.75 in | Wt 207.5 lb

## 2012-11-26 DIAGNOSIS — Z0000 Encounter for general adult medical examination without abnormal findings: Secondary | ICD-10-CM

## 2012-11-26 DIAGNOSIS — M1710 Unilateral primary osteoarthritis, unspecified knee: Secondary | ICD-10-CM

## 2012-11-26 DIAGNOSIS — Z23 Encounter for immunization: Secondary | ICD-10-CM

## 2012-11-26 DIAGNOSIS — E785 Hyperlipidemia, unspecified: Secondary | ICD-10-CM

## 2012-11-26 DIAGNOSIS — I10 Essential (primary) hypertension: Secondary | ICD-10-CM

## 2012-11-26 DIAGNOSIS — M1711 Unilateral primary osteoarthritis, right knee: Secondary | ICD-10-CM | POA: Insufficient documentation

## 2012-11-26 LAB — CBC WITH DIFFERENTIAL/PLATELET
Basophils Absolute: 0 10*3/uL (ref 0.0–0.1)
Basophils Relative: 0.4 % (ref 0.0–3.0)
Eosinophils Absolute: 0.1 10*3/uL (ref 0.0–0.7)
Eosinophils Relative: 1.3 % (ref 0.0–5.0)
HCT: 38.6 % (ref 36.0–46.0)
Hemoglobin: 13.3 g/dL (ref 12.0–15.0)
Lymphocytes Relative: 22 % (ref 12.0–46.0)
Lymphs Abs: 2.4 10*3/uL (ref 0.7–4.0)
MCHC: 34.5 g/dL (ref 30.0–36.0)
MCV: 87 fl (ref 78.0–100.0)
Monocytes Absolute: 0.8 10*3/uL (ref 0.1–1.0)
Monocytes Relative: 7.6 % (ref 3.0–12.0)
Neutro Abs: 7.4 10*3/uL (ref 1.4–7.7)
Neutrophils Relative %: 68.7 % (ref 43.0–77.0)
Platelets: 296 10*3/uL (ref 150.0–400.0)
RBC: 4.44 Mil/uL (ref 3.87–5.11)
RDW: 13.6 % (ref 11.5–14.6)
WBC: 10.7 10*3/uL — ABNORMAL HIGH (ref 4.5–10.5)

## 2012-11-26 LAB — BASIC METABOLIC PANEL
BUN: 21 mg/dL (ref 6–23)
CO2: 28 mEq/L (ref 19–32)
Calcium: 9.4 mg/dL (ref 8.4–10.5)
Chloride: 100 mEq/L (ref 96–112)
Creatinine, Ser: 1 mg/dL (ref 0.4–1.2)
GFR: 55.06 mL/min — ABNORMAL LOW (ref >60.00)
Glucose, Bld: 90 mg/dL (ref 70–99)
Potassium: 3.7 mEq/L (ref 3.5–5.1)
Sodium: 135 mEq/L (ref 135–145)

## 2012-11-26 LAB — TSH: TSH: 3.47 u[IU]/mL (ref 0.35–5.50)

## 2012-11-26 LAB — HEPATIC FUNCTION PANEL
ALT: 18 U/L (ref 0–35)
AST: 23 U/L (ref 0–37)
Albumin: 3.8 g/dL (ref 3.5–5.2)
Alkaline Phosphatase: 97 U/L (ref 39–117)
Bilirubin, Direct: 0.1 mg/dL (ref 0.0–0.3)
Total Bilirubin: 0.6 mg/dL (ref 0.3–1.2)
Total Protein: 7.4 g/dL (ref 6.0–8.3)

## 2012-11-26 LAB — LIPID PANEL
Cholesterol: 185 mg/dL (ref 0–200)
HDL: 54.7 mg/dL (ref >39.00)
LDL Cholesterol: 103 mg/dL — ABNORMAL HIGH (ref 0–99)
Total CHOL/HDL Ratio: 3
Triglycerides: 138 mg/dL (ref 0.0–149.0)
VLDL: 27.6 mg/dL (ref 0.0–40.0)

## 2012-11-26 MED ORDER — OMEPRAZOLE 20 MG PO CPDR: 20.0000 mg | DELAYED_RELEASE_CAPSULE | Freq: Every day | ORAL | Status: DC

## 2012-11-26 NOTE — Addendum Note (Signed)
Addended by: Sheliah Hatch on: 11/26/2012 04:50 PM   Modules accepted: Orders

## 2012-11-26 NOTE — Progress Notes (Signed)
  Subjective:    Patient ID: Kristen Manning, female    DOB: 08-06-1932, 79 y.o.   MRN: [id number]  HPI Here today for CPE.  Risk Factors: HTN- chronic problem, on HCTZ.  No CP, SOB, HAs, visual changes, edema above baseline. Hyperlipidemia- chronic problem, on Zetia, fish oil.  Denies abd pain, N/V R knee OA- scheduled for TKR. Physical Activity: walking regularly Fall Risk: low risk, steady on feet Depression: denies current sxs Hearing: decreased to whispered voice on L, normal to conversational tones ADL's: independent Cognitive: normal linear thought process, memory and attention intact Home Safety: safe at home Height, Weight, BMI, Visual Acuity: see vitals, vision corrected to 20/20 w/ glasses Counseling: UTD on mammo and DEXA.  Refusing colonoscopy.  No need for pap. Labs Ordered: See A&P Care Plan: See A&P    Review of Systems Patient reports no vision/ hearing changes, adenopathy,fever, weight change,  persistant/recurrent hoarseness , swallowing issues, chest pain, palpitations, edema, persistant/recurrent cough, hemoptysis, dyspnea (rest/exertional/paroxysmal nocturnal), gastrointestinal bleeding (melena, rectal bleeding), abdominal pain, bowel changes, GU symptoms (dysuria, hematuria, incontinence), Gyn symptoms (abnormal  bleeding, pain),  syncope, focal weakness, memory loss, numbness & tingling, skin/hair/nail changes, abnormal bruising or bleeding, anxiety, or depression.  + GERD     Objective:   Physical Exam General Appearance:    Alert, cooperative, no distress, appears stated age  Head:    Normocephalic, without obvious abnormality, atraumatic  Eyes:    PERRL, conjunctiva/corneas clear, EOM's intact, fundi    benign, both eyes  Ears:    Normal TM's and external ear canals, both ears  Nose:   Nares normal, septum midline, mucosa normal, no drainage    or sinus tenderness  Throat:   Lips, mucosa, and tongue normal; teeth and gums normal  Neck:   Supple,  symmetrical, trachea midline, no adenopathy;    Thyroid: no enlargement/tenderness/nodules  Back:     Symmetric, no curvature, ROM normal, no CVA tenderness  Lungs:     Clear to auscultation bilaterally, respirations unlabored  Chest Wall:    No tenderness or deformity   Heart:    Regular rate and rhythm, S1 and S2 normal, no murmur, rub   or gallop  Breast Exam:    Deferred to mammo  Abdomen:     Soft, non-tender, bowel sounds active all four quadrants,    no masses, no organomegaly  Genitalia:    Deferred  Rectal:    Extremities:   Extremities normal, atraumatic, no cyanosis or edema  Pulses:   2+ and symmetric all extremities  Skin:   Skin color, texture, turgor normal, no rashes or lesions  Lymph nodes:   Cervical, supraclavicular, and axillary nodes normal  Neurologic:   CNII-XII intact, normal strength, sensation and reflexes    throughout          Assessment & Plan:

## 2012-11-26 NOTE — Telephone Encounter (Signed)
Patient called and stated that she was seen today and that she was suppose to get something for ingestion. She states that the pharmacy has not received anything yet. Please advise. SW

## 2012-11-26 NOTE — Assessment & Plan Note (Signed)
Chronic problem.  Tolerating zetia w/out difficulty.  Check labs.  Adjust meds prn

## 2012-11-26 NOTE — Patient Instructions (Signed)
Follow up in 6 months to recheck BP and cholesterol We'll notify you of your lab results and make any changes if needed We'll submit your paperwork to the surgeon Keep up the good work! Happy Holidays!

## 2012-11-26 NOTE — Assessment & Plan Note (Signed)
End stage.  Pt needs clearance for R TKR.  No reason to no proceed.  Will fax form to ortho.

## 2012-11-26 NOTE — Telephone Encounter (Signed)
Noted med filled.

## 2012-11-26 NOTE — Telephone Encounter (Signed)
Will send script for omeprazole 20mg  in open chart

## 2012-11-26 NOTE — Assessment & Plan Note (Signed)
Chronic problem.  Adequate control.  Asymptomatic.  EKG done- see document for interpretation.

## 2012-11-26 NOTE — Assessment & Plan Note (Signed)
Pt's PE WNL.  UTD on mammo and DEXA.  Declines colonoscopy.  EKG done- see document for interpretation.  Check labs.  Anticipatory guidance provided.

## 2012-12-07 ENCOUNTER — Encounter: Payer: Medicare Other | Admitting: Family Medicine

## 2012-12-10 ENCOUNTER — Other Ambulatory Visit (INDEPENDENT_AMBULATORY_CARE_PROVIDER_SITE_OTHER): Payer: Medicare Other

## 2012-12-10 DIAGNOSIS — D72829 Elevated white blood cell count, unspecified: Secondary | ICD-10-CM

## 2012-12-10 LAB — CBC WITH DIFFERENTIAL/PLATELET
Basophils Absolute: 0 10*3/uL (ref 0.0–0.1)
Basophils Relative: 0.3 % (ref 0.0–3.0)
Eosinophils Absolute: 0.2 10*3/uL (ref 0.0–0.7)
Eosinophils Relative: 2.2 % (ref 0.0–5.0)
HCT: 38.2 % (ref 36.0–46.0)
Hemoglobin: 12.9 g/dL (ref 12.0–15.0)
Lymphocytes Relative: 15.6 % (ref 12.0–46.0)
Lymphs Abs: 1.5 10*3/uL (ref 0.7–4.0)
MCHC: 33.7 g/dL (ref 30.0–36.0)
MCV: 88.4 fl (ref 78.0–100.0)
Monocytes Absolute: 0.7 10*3/uL (ref 0.1–1.0)
Monocytes Relative: 7.5 % (ref 3.0–12.0)
Neutro Abs: 7.2 10*3/uL (ref 1.4–7.7)
Neutrophils Relative %: 74.4 % (ref 43.0–77.0)
Platelets: 295 10*3/uL (ref 150.0–400.0)
RBC: 4.32 Mil/uL (ref 3.87–5.11)
RDW: 13.9 % (ref 11.5–14.6)
WBC: 9.6 10*3/uL (ref 4.5–10.5)

## 2012-12-11 ENCOUNTER — Encounter: Payer: Self-pay | Admitting: General Practice

## 2013-01-04 ENCOUNTER — Encounter (HOSPITAL_COMMUNITY): Payer: Self-pay | Admitting: Pharmacy Technician

## 2013-01-05 ENCOUNTER — Other Ambulatory Visit: Payer: Self-pay | Admitting: Orthopedic Surgery

## 2013-01-08 NOTE — Pre-Procedure Instructions (Signed)
Kristen Manning  01/08/2013   Your procedure is scheduled on: Monday, January 12th.  Report to [facility], In, Main Entrance / Entrance "A" at 5:30AM.  Call this number if you have problems the morning of surgery: [phone]   Remember:   Do not eat food or drink liquids after midnight.   Take these medicines the morning of surgery with A SIP OF WATER: omeprazole (PRILOSEC).  Stop taking : naproxen sodium (ANAPROX), today, January  5th.   Do not wear jewelry, make-up or nail polish.  Do not wear lotions, powders, or perfumes. You may wear deodorant.  Do not shave 48 hours prior to surgery.   Do not bring valuables to the hospital.  [facility] is not responsible for any belongings or valuables.               Contacts, dentures or bridgework may not be worn into surgery.  Leave suitcase in the car. After surgery it may be brought to your room.  For patients admitted to the hospital, discharge time is determined by your treatment team.              Special Instructions: Shower using CHG 2 nights before surgery and the night before surgery.  If you shower the day of surgery use CHG.  Use special wash - you have one bottle of CHG for all showers.  You should use approximately 1/3 of the bottle for each shower.   Please read over the following fact sheets that you were given: Pain Booklet, Coughing and Deep Breathing, Blood Transfusion Information and Surgical Site Infection Prevention

## 2013-01-11 ENCOUNTER — Encounter (HOSPITAL_COMMUNITY): Payer: Self-pay

## 2013-01-11 ENCOUNTER — Encounter (HOSPITAL_COMMUNITY)
Admission: RE | Admit: 2013-01-11 | Discharge: 2013-01-11 | Disposition: A | Discharge status: Home / Self Care | Payer: Medicare Other | Source: Ambulatory Visit | Attending: Orthopedic Surgery | Admitting: Orthopedic Surgery

## 2013-01-11 ENCOUNTER — Other Ambulatory Visit: Payer: Self-pay | Admitting: Family Medicine

## 2013-01-11 HISTORY — DX: Gastro-esophageal reflux disease without esophagitis: K21.9

## 2013-01-11 HISTORY — DX: Acute myocardial infarction, unspecified: I21.9

## 2013-01-11 HISTORY — DX: Malignant (primary) neoplasm, unspecified: C80.1

## 2013-01-11 HISTORY — DX: Shortness of breath: R06.02

## 2013-01-11 LAB — CBC WITH DIFFERENTIAL/PLATELET
Basophils Absolute: 0 10*3/uL (ref 0.0–0.1)
Basophils Relative: 0 % (ref 0–1)
Eosinophils Absolute: 0.1 10*3/uL (ref 0.0–0.7)
Eosinophils Relative: 1 % (ref 0–5)
HCT: 41 % (ref 36.0–46.0)
Hemoglobin: 14 g/dL (ref 12.0–15.0)
Lymphocytes Relative: 18 % (ref 12–46)
Lymphs Abs: 1.9 10*3/uL (ref 0.7–4.0)
MCH: 31 pg (ref 26.0–34.0)
MCHC: 34.1 g/dL (ref 30.0–36.0)
MCV: 90.9 fL (ref 78.0–100.0)
Monocytes Absolute: 0.9 10*3/uL (ref 0.1–1.0)
Monocytes Relative: 9 % (ref 3–12)
Neutro Abs: 7.3 10*3/uL (ref 1.7–7.7)
Neutrophils Relative %: 72 % (ref 43–77)
Platelets: 349 10*3/uL (ref 150–400)
RBC: 4.51 MIL/uL (ref 3.87–5.11)
RDW: 13.5 % (ref 11.5–15.5)
WBC: 10.1 10*3/uL (ref 4.0–10.5)

## 2013-01-11 LAB — COMPREHENSIVE METABOLIC PANEL
ALT: 16 U/L (ref 0–35)
AST: 21 U/L (ref 0–37)
Albumin: 3.8 g/dL (ref 3.5–5.2)
Alkaline Phosphatase: 120 U/L — ABNORMAL HIGH (ref 39–117)
BUN: 20 mg/dL (ref 6–23)
CO2: 29 mEq/L (ref 19–32)
Calcium: 9.8 mg/dL (ref 8.4–10.5)
Chloride: 101 mEq/L (ref 96–112)
Creatinine, Ser: 1.13 mg/dL — ABNORMAL HIGH (ref 0.50–1.10)
GFR calc Af Amer: 53 mL/min — ABNORMAL LOW (ref >90)
GFR calc non Af Amer: 45 mL/min — ABNORMAL LOW (ref >90)
Glucose, Bld: 91 mg/dL (ref 70–99)
Potassium: 4.6 mEq/L (ref 3.7–5.3)
Sodium: 142 mEq/L (ref 137–147)
Total Bilirubin: 0.3 mg/dL (ref 0.3–1.2)
Total Protein: 7.9 g/dL (ref 6.0–8.3)

## 2013-01-11 LAB — SURGICAL PCR SCREEN
MRSA, PCR: NEGATIVE
Staphylococcus aureus: NEGATIVE

## 2013-01-11 LAB — URINE MICROSCOPIC-ADD ON

## 2013-01-11 LAB — URINALYSIS, ROUTINE W REFLEX MICROSCOPIC
Bilirubin Urine: NEGATIVE
Glucose, UA: NEGATIVE mg/dL
Ketones, ur: NEGATIVE mg/dL
Leukocytes, UA: NEGATIVE
Nitrite: NEGATIVE
Protein, ur: NEGATIVE mg/dL
Specific Gravity, Urine: 1.013 (ref 1.005–1.030)
Urobilinogen, UA: 0.2 mg/dL (ref 0.0–1.0)
pH: 6.5 (ref 5.0–8.0)

## 2013-01-11 LAB — APTT: aPTT: 32 seconds (ref 24–37)

## 2013-01-11 LAB — PROTIME-INR
INR: 1.04 (ref 0.00–1.49)
Prothrombin Time: 13.4 seconds (ref 11.6–15.2)

## 2013-01-12 LAB — URINE CULTURE
Colony Count: NO GROWTH
Culture: NO GROWTH

## 2013-01-12 LAB — TYPE AND SCREEN
ABO/RH(D): A POS
Antibody Screen: NEGATIVE

## 2013-01-13 NOTE — Telephone Encounter (Signed)
Med filled.  

## 2013-01-14 ENCOUNTER — Encounter: Payer: Self-pay | Admitting: Internal Medicine

## 2013-01-14 ENCOUNTER — Ambulatory Visit (INDEPENDENT_AMBULATORY_CARE_PROVIDER_SITE_OTHER): Payer: Medicare Other | Admitting: Internal Medicine

## 2013-01-14 ENCOUNTER — Ambulatory Visit (HOSPITAL_BASED_OUTPATIENT_CLINIC_OR_DEPARTMENT_OTHER)
Admission: RE | Admit: 2013-01-14 | Discharge: 2013-01-14 | Disposition: A | Discharge status: Home / Self Care | Payer: Medicare Other | Source: Ambulatory Visit | Attending: Internal Medicine | Admitting: Internal Medicine

## 2013-01-14 ENCOUNTER — Telehealth: Payer: Self-pay | Admitting: *Deleted

## 2013-01-14 VITALS — BP 113/73 | HR 101 | Temp 98.8°F | Wt 209.6 lb

## 2013-01-14 DIAGNOSIS — R0989 Other specified symptoms and signs involving the circulatory and respiratory systems: Secondary | ICD-10-CM

## 2013-01-14 DIAGNOSIS — R000 Tachycardia, unspecified: Secondary | ICD-10-CM

## 2013-01-14 DIAGNOSIS — J069 Acute upper respiratory infection, unspecified: Secondary | ICD-10-CM

## 2013-01-14 DIAGNOSIS — R0602 Shortness of breath: Secondary | ICD-10-CM | POA: Insufficient documentation

## 2013-01-14 LAB — POCT INFLUENZA A/B
Influenza A, POC: NEGATIVE
Influenza B, POC: NEGATIVE

## 2013-01-14 NOTE — Progress Notes (Signed)
   Subjective:    Patient ID: Kristen Manning, female    DOB: 1932/10/14, 80 y.o.   MRN: [id number]  HPI   She became acutely ill 01/14/13 in the evening with frontal sinus and maxilla sinus area pain with clear nasal drainage. She has pain in the ears, greater on the left. She describes cough with clear sputum and shortness of breath. She's had chills without definite fever. She also describes sore throat. She has retro-orbital pain and diffuse headache associated with decreased ability to focus visually. She has significant arthralgias and myalgias with the present illness. Alka Seltzer Cough & Cold of no benefit.  She did not take the flu shot.  She is scheduled for total knee replacement 01/18/13.CXR 1/5 was normal She never smoked.     Review of Systems  She is not having pain in the teeth or otic discharge.     Objective:   Physical Exam Gen.: Appears acutely ill but adequately nourished in appearance. Alert, appropriate and cooperative throughout exam.   Eyes: No corneal or conjunctival inflammation noted. Pupils equal round reactive to light and accommodation. Extraocular motion intact.  Vision grossly normal without  lenses Ears: External  ear exam reveals no significant lesions or deformities. Canals clear .TMs dull.  Nose: External nasal exam reveals no deformity or inflammation. Nasal mucosa are dry with scant blood. No lesions or exudates noted.  Mouth: Oral mucosa and oropharynx reveals gray  exudate. Teeth in good repair. Neck: No deformities, masses, or tenderness noted.  Lungs: Normal respiratory effort; chest expands symmetrically. Juicy rales L mid - lower lung field; also minimally on R Heart: Slightly fast  rate and regular rhythm. Normal S1 and S2. No gallop, click, or rub. S4 w/o murmur.                             Musculoskeletal/extremities:No clubbing or cyanosis .Trace  edema noted.  Neurologic: Alert and oriented x3.  Skin: Intact without suspicious lesions  or rashes.Slight tenting Lymph: No cervical, axillary lymphadenopathy present. Psych: Mood and affect are normal. Normally interactive                                                                                        Assessment & Plan:  #1 acute RTI with slight tachycardia,slightly low O2 sats & rales ,R/O flu, CAP See orders

## 2013-01-14 NOTE — Progress Notes (Signed)
Pre visit review using our clinic review tool, if applicable. No additional management support is needed unless otherwise documented below in the visit note. 

## 2013-01-14 NOTE — Telephone Encounter (Signed)
Pt needs an appt to rule out any sickness and in case abx is needed before surgery. Please schedule.

## 2013-01-14 NOTE — Patient Instructions (Signed)
Order for x-rays entered into  the computer; these will be performed at [facility] No appointment is necessary.

## 2013-01-14 NOTE — Telephone Encounter (Signed)
Patient called and stated that she is experiencing headaches,body aches, sore throat and has mucus that is clear. Patient is going to have knee surgery on Monday and the doctor order that she not take anything until after surgery. Patient wanted to know what does dr Birdie Riddle recommend her to do.

## 2013-01-15 ENCOUNTER — Ambulatory Visit: Payer: Medicare Other | Admitting: Internal Medicine

## 2013-01-15 ENCOUNTER — Other Ambulatory Visit: Payer: Self-pay | Admitting: Internal Medicine

## 2013-01-15 DIAGNOSIS — J209 Acute bronchitis, unspecified: Secondary | ICD-10-CM

## 2013-01-15 LAB — CBC WITH DIFFERENTIAL/PLATELET
Basophils Absolute: 0 10*3/uL (ref 0.0–0.1)
Basophils Relative: 0.4 % (ref 0.0–3.0)
Eosinophils Absolute: 0.1 10*3/uL (ref 0.0–0.7)
Eosinophils Relative: 1.4 % (ref 0.0–5.0)
HCT: 38.1 % (ref 36.0–46.0)
Hemoglobin: 13 g/dL (ref 12.0–15.0)
Lymphocytes Relative: 9.7 % — ABNORMAL LOW (ref 12.0–46.0)
Lymphs Abs: 1 10*3/uL (ref 0.7–4.0)
MCHC: 34 g/dL (ref 30.0–36.0)
MCV: 88.2 fl (ref 78.0–100.0)
Monocytes Absolute: 0.7 10*3/uL (ref 0.1–1.0)
Monocytes Relative: 7.1 % (ref 3.0–12.0)
Neutro Abs: 8.2 10*3/uL — ABNORMAL HIGH (ref 1.4–7.7)
Neutrophils Relative %: 81.4 % — ABNORMAL HIGH (ref 43.0–77.0)
Platelets: 280 10*3/uL (ref 150.0–400.0)
RBC: 4.32 Mil/uL (ref 3.87–5.11)
RDW: 13.9 % (ref 11.5–14.6)
WBC: 10 10*3/uL (ref 4.5–10.5)

## 2013-01-15 MED ORDER — PREDNISONE 20 MG PO TABS: 20.0000 mg | ORAL_TABLET | Freq: Two times a day (BID) | ORAL | Status: DC

## 2013-01-15 MED ORDER — BENZONATATE 200 MG PO CAPS: 200.0000 mg | ORAL_CAPSULE | Freq: Three times a day (TID) | ORAL | Status: DC | PRN

## 2013-01-15 MED ORDER — AZITHROMYCIN 250 MG PO TABS: ORAL_TABLET | ORAL | Status: DC

## 2013-01-15 NOTE — Progress Notes (Signed)
Spoke with patient and advised lab and xray results per Hopp. Advise patient that we will send in abx per blood work evidence. Advised that if she is not improving greatly by the first of the week we need to see her back. Patient staes understanding.

## 2013-01-18 ENCOUNTER — Encounter: Payer: Self-pay | Admitting: *Deleted

## 2013-01-18 ENCOUNTER — Encounter (HOSPITAL_COMMUNITY): Admission: RE | Payer: Self-pay | Source: Ambulatory Visit

## 2013-01-18 ENCOUNTER — Inpatient Hospital Stay (HOSPITAL_COMMUNITY): Admission: RE | Admit: 2013-01-18 | Payer: Medicare Other | Source: Ambulatory Visit | Admitting: Orthopedic Surgery

## 2013-01-18 ENCOUNTER — Telehealth: Payer: Self-pay | Admitting: *Deleted

## 2013-01-18 SURGERY — ARTHROPLASTY, KNEE, TOTAL
Anesthesia: Regional | Site: Knee | Laterality: Right

## 2013-01-18 NOTE — Telephone Encounter (Signed)
See order note on (01-16-12).//AB/CMA

## 2013-01-18 NOTE — Telephone Encounter (Signed)
Message copied by Harl Bowie on Mon Jan 18, 2013  9:46 AM ------      Message from: Hendricks Limes      Created: Thu Jan 14, 2013  6:34 PM       The white count will determine whether we start Tamiflu versus a Z-Pak ------

## 2013-01-19 ENCOUNTER — Telehealth: Payer: Self-pay | Admitting: *Deleted

## 2013-01-19 NOTE — Telephone Encounter (Signed)
Message copied by Harl Bowie on Tue Jan 19, 2013 11:11 AM ------      Message from: Kris Hartmann      Created: Fri Jan 15, 2013  8:31 AM      Regarding: Labs and X-Ray       I spoke with Pt today in regards to another matter. I let her know the results of her X-ray. She would like the x-ray results and labs faxed to number she provided you yesterday.                   Thanks!      Jess ------

## 2013-01-20 NOTE — Telephone Encounter (Signed)
See order note(01-16-12).//AB/CMA

## 2013-01-21 ENCOUNTER — Telehealth: Payer: Self-pay | Admitting: *Deleted

## 2013-01-21 ENCOUNTER — Other Ambulatory Visit: Payer: Self-pay | Admitting: *Deleted

## 2013-01-21 NOTE — Telephone Encounter (Signed)
Left message for patient to please return call to let us know how she is doing. JG//CMA

## 2013-01-21 NOTE — Telephone Encounter (Signed)
Message copied by Chaya Jan on Thu Jan 21, 2013  3:29 PM ------      Message from: Hendricks Limes      Created: Thu Jan 21, 2013  1:18 PM       Please call and see how she is doing after she completed the course of antibiotics for respiratory tract infection. ------

## 2013-01-22 ENCOUNTER — Encounter: Payer: Self-pay | Admitting: Internal Medicine

## 2013-01-22 ENCOUNTER — Ambulatory Visit (INDEPENDENT_AMBULATORY_CARE_PROVIDER_SITE_OTHER): Payer: Medicare Other | Admitting: Internal Medicine

## 2013-01-22 VITALS — BP 143/71 | HR 85 | Temp 98.2°F | Resp 14 | Wt 207.2 lb

## 2013-01-22 DIAGNOSIS — R5381 Other malaise: Secondary | ICD-10-CM

## 2013-01-22 DIAGNOSIS — R5383 Other fatigue: Secondary | ICD-10-CM

## 2013-01-22 DIAGNOSIS — J209 Acute bronchitis, unspecified: Secondary | ICD-10-CM

## 2013-01-22 DIAGNOSIS — R51 Headache: Secondary | ICD-10-CM

## 2013-01-22 MED ORDER — DOXYCYCLINE HYCLATE 100 MG PO TABS: 100.0000 mg | ORAL_TABLET | Freq: Two times a day (BID) | ORAL | Status: DC

## 2013-01-22 MED ORDER — BENZONATATE 200 MG PO CAPS: 200.0000 mg | ORAL_CAPSULE | Freq: Three times a day (TID) | ORAL | Status: DC | PRN

## 2013-01-22 NOTE — Progress Notes (Signed)
   Subjective:    Patient ID: Kristen Manning, female    DOB: 1932/01/31, 80 y.o.   MRN: [id number]  HPI   She has completed the Zithromax ; she continues to have multi systems complaints  She has a chronic dull headache across the forehead and around the eyes. She also has had intermittent profound hypertension with blood pressure up to 170/102. This was associated with some imbalance on 1/14  She's had shortness of breath without paroxysmal nocturnal dyspnea .  She  feels shaky and unsteady. She has no definite fever but has had significant night sweats.  Other symptoms include sore throat and photosensitivity.  She has had some wheezing.  She admits to being anxious and nervous. One factor is death among 2 in-laws recently ; one from a bacterial infection.  B1/8/15 chest x-ray was reviewed. There are increased linear interstitial markings in the left lower lobe in the retrocardiac area suggesting atelectasis.   No PMH of smoking or asthma   Review of Systems  She does not have any nasal purulence. Secretions have been difficult to produce are now described as clear and scant  She does have diffuse osteoarthritic pain but no severe joint and muscle pain. Celebrex 100 her today I still hear     Objective:   Physical Exam General appearance:anxious;well nourished; no acute distress or increased work of breathing is present.  No  lymphadenopathy about the head, neck, or axilla noted.   Eyes: No conjunctival inflammation or lid edema is present. There is no scleral icterus.EOMI .FOV WNL. Vision is grossly intact.  Ears:  External ear exam shows no significant lesions or deformities.  Otoscopic examination reveals clear canals, tympanic membranes are intact bilaterally without bulging, retraction, inflammation or discharge.  Nose:  External nasal examination shows no deformity or inflammation. Nasal mucosa are pink and moist without lesions or exudates. No septal dislocation or  deviation.No obstruction to airflow.   Oral exam: Dental hygiene is good; lips and gums are healthy appearing.There is no oropharyngeal erythema or exudate noted.   Neck:  No deformities,  masses, or tenderness noted.   Supple with full range of motion without pain.   Heart:  Normal rate and regular rhythm. S1 and S2 normal without gallop, murmur, click, rub or other extra sounds.   Lungs:Dry rub like rales RLL> LLL  present.No increased work of breathing.    Extremities:  No cyanosis, edema, or clubbing  noted   Neurologic exam :Cn 2-7 intact.Strength equal & normal in upper & lower extremities Unstable gait.  Romberg normal, finger to nose  With slight past pointing Skin: Warm & dry w/o jaundice or tenting.         Assessment & Plan:  #1 malaise  #2 headache #3 bronchitis , resolving See orders

## 2013-01-22 NOTE — Progress Notes (Signed)
Pre visit review using our clinic review tool, if applicable. No additional management support is needed unless otherwise documented below in the visit note. 

## 2013-01-22 NOTE — Telephone Encounter (Signed)
Spoke with patient 01/21/13. She stated she was not feeling better. States she is wheezing. Appt was made for 01/22/13 with Dr. Linna Darner

## 2013-01-22 NOTE — Patient Instructions (Signed)
For pain take Aleve one to 2 every 8-12 hours with food as needed.  Please  blowup at least 10  balloons a day to enhance inflation of the lungs and prevent atelectasis as we discussed.  Advair  100/50 one  inhalation every 12 hours; gargle and spit after use

## 2013-02-04 ENCOUNTER — Other Ambulatory Visit: Payer: Self-pay | Admitting: Orthopedic Surgery

## 2013-02-08 ENCOUNTER — Encounter (HOSPITAL_COMMUNITY): Payer: Self-pay | Admitting: Pharmacy Technician

## 2013-02-12 ENCOUNTER — Encounter (HOSPITAL_COMMUNITY)
Admission: RE | Admit: 2013-02-12 | Discharge: 2013-02-12 | Disposition: A | Discharge status: Home / Self Care | Payer: Medicare Other | Source: Ambulatory Visit | Attending: Orthopedic Surgery | Admitting: Orthopedic Surgery

## 2013-02-12 ENCOUNTER — Encounter (HOSPITAL_COMMUNITY): Payer: Self-pay

## 2013-02-12 DIAGNOSIS — Z01812 Encounter for preprocedural laboratory examination: Secondary | ICD-10-CM | POA: Insufficient documentation

## 2013-02-12 HISTORY — DX: Unspecified osteoarthritis, unspecified site: M19.90

## 2013-02-12 HISTORY — DX: Nausea with vomiting, unspecified: R11.2

## 2013-02-12 HISTORY — DX: Other specified postprocedural states: Z98.890

## 2013-02-12 HISTORY — DX: Pain in unspecified joint: M25.50

## 2013-02-12 HISTORY — DX: Frequency of micturition: R35.0

## 2013-02-12 HISTORY — DX: Effusion, unspecified joint: M25.40

## 2013-02-12 HISTORY — DX: Other chronic pain: G89.29

## 2013-02-12 HISTORY — DX: Nocturia: R35.1

## 2013-02-12 HISTORY — DX: Urgency of urination: R39.15

## 2013-02-12 HISTORY — DX: Dorsalgia, unspecified: M54.9

## 2013-02-12 HISTORY — DX: Pneumonia, unspecified organism: J18.9

## 2013-02-12 HISTORY — DX: Headache: R51

## 2013-02-12 HISTORY — DX: Personal history of other diseases of the nervous system and sense organs: Z86.69

## 2013-02-12 HISTORY — DX: Bacterial infection, unspecified: A49.9

## 2013-02-12 LAB — CBC WITH DIFFERENTIAL/PLATELET
Basophils Absolute: 0 10*3/uL (ref 0.0–0.1)
Basophils Relative: 0 % (ref 0–1)
Eosinophils Absolute: 0.3 10*3/uL (ref 0.0–0.7)
Eosinophils Relative: 3 % (ref 0–5)
HCT: 38.6 % (ref 36.0–46.0)
Hemoglobin: 13.2 g/dL (ref 12.0–15.0)
Lymphocytes Relative: 23 % (ref 12–46)
Lymphs Abs: 2.1 10*3/uL (ref 0.7–4.0)
MCH: 30.5 pg (ref 26.0–34.0)
MCHC: 34.2 g/dL (ref 30.0–36.0)
MCV: 89.1 fL (ref 78.0–100.0)
Monocytes Absolute: 0.7 10*3/uL (ref 0.1–1.0)
Monocytes Relative: 8 % (ref 3–12)
Neutro Abs: 5.9 10*3/uL (ref 1.7–7.7)
Neutrophils Relative %: 66 % (ref 43–77)
Platelets: 347 10*3/uL (ref 150–400)
RBC: 4.33 MIL/uL (ref 3.87–5.11)
RDW: 13.4 % (ref 11.5–15.5)
WBC: 9.1 10*3/uL (ref 4.0–10.5)

## 2013-02-12 LAB — COMPREHENSIVE METABOLIC PANEL
ALT: 15 U/L (ref 0–35)
AST: 20 U/L (ref 0–37)
Albumin: 3.4 g/dL — ABNORMAL LOW (ref 3.5–5.2)
Alkaline Phosphatase: 128 U/L — ABNORMAL HIGH (ref 39–117)
BUN: 18 mg/dL (ref 6–23)
CO2: 24 mEq/L (ref 19–32)
Calcium: 9.1 mg/dL (ref 8.4–10.5)
Chloride: 101 mEq/L (ref 96–112)
Creatinine, Ser: 1.08 mg/dL (ref 0.50–1.10)
GFR calc Af Amer: 55 mL/min — ABNORMAL LOW (ref >90)
GFR calc non Af Amer: 48 mL/min — ABNORMAL LOW (ref >90)
Glucose, Bld: 87 mg/dL (ref 70–99)
Potassium: 4.3 mEq/L (ref 3.7–5.3)
Sodium: 140 mEq/L (ref 137–147)
Total Bilirubin: 0.3 mg/dL (ref 0.3–1.2)
Total Protein: 7.5 g/dL (ref 6.0–8.3)

## 2013-02-12 LAB — PROTIME-INR
INR: 1.04 (ref 0.00–1.49)
Prothrombin Time: 13.4 seconds (ref 11.6–15.2)

## 2013-02-12 LAB — TYPE AND SCREEN
ABO/RH(D): A POS
Antibody Screen: NEGATIVE

## 2013-02-12 LAB — SURGICAL PCR SCREEN
MRSA, PCR: NEGATIVE
Staphylococcus aureus: NEGATIVE

## 2013-02-12 LAB — URINALYSIS, ROUTINE W REFLEX MICROSCOPIC
Bilirubin Urine: NEGATIVE
Glucose, UA: NEGATIVE mg/dL
Ketones, ur: NEGATIVE mg/dL
Leukocytes, UA: NEGATIVE
Nitrite: NEGATIVE
Protein, ur: NEGATIVE mg/dL
Specific Gravity, Urine: 1.015 (ref 1.005–1.030)
Urobilinogen, UA: 1 mg/dL (ref 0.0–1.0)
pH: 6 (ref 5.0–8.0)

## 2013-02-12 LAB — URINE MICROSCOPIC-ADD ON

## 2013-02-12 LAB — APTT: aPTT: 32 seconds (ref 24–37)

## 2013-02-12 MED ORDER — CHLORHEXIDINE GLUCONATE 4 % EX LIQD: 60.0000 mL | Freq: Once | CUTANEOUS | Status: DC

## 2013-02-12 MED ORDER — SODIUM CHLORIDE 0.9 % IV SOLN: INTRAVENOUS | Status: DC

## 2013-02-12 NOTE — Pre-Procedure Instructions (Signed)
Kristen Manning  02/12/2013   Your procedure is scheduled on:  Mon, Feb 16 @ 8:45 AM  Report to [facility] Stay Entrance A  at 6:45 AM.  Call this number if you have problems the morning of surgery: [phone]   Remember:   Do not eat food or drink liquids after midnight.   Take these medicines the morning of surgery with A SIP OF WATER: Omeprazole(Prilosec)   Do not wear jewelry, make-up or nail polish.  Do not wear lotions, powders, or perfumes. You may wear deodorant.  Do not shave 48 hours prior to surgery.   Do not bring valuables to the hospital.  [facility] is not responsible                  for any belongings or valuables.               Contacts, dentures or bridgework may not be worn into surgery.  Leave suitcase in the car. After surgery it may be brought to your room.  For patients admitted to the hospital, discharge time is determined by your                treatment team.                 Special Instructions:  [place] - Preparing for Surgery  Before surgery, you can play an important role.  Because skin is not sterile, your skin needs to be as free of germs as possible.  You can reduce the number of germs on you skin by washing with CHG (chlorahexidine gluconate) soap before surgery.  CHG is an antiseptic cleaner which kills germs and bonds with the skin to continue killing germs even after washing.  Please DO NOT use if you have an allergy to CHG or antibacterial soaps.  If your skin becomes reddened/irritated stop using the CHG and inform your nurse when you arrive at Short Stay.  Do not shave (including legs and underarms) for at least 48 hours prior to the first CHG shower.  You may shave your face.  Please follow these instructions carefully:   1.  Shower with CHG Soap the night before surgery and the                                morning of Surgery.  2.  If you choose to wash your hair, wash your hair first as usual with your       normal  shampoo.  3.  After you shampoo, rinse your hair and body thoroughly to remove the                      Shampoo.  4.  Use CHG as you would any other liquid soap.  You can apply chg directly       to the skin and wash gently with scrungie or a clean washcloth.  5.  Apply the CHG Soap to your body ONLY FROM THE NECK DOWN.        Do not use on open wounds or open sores.  Avoid contact with your eyes,       ears, mouth and genitals (private parts).  Wash genitals (private parts)       with your normal soap.  6.  Wash thoroughly, paying special attention to the area where your surgery  will be performed.  7.  Thoroughly rinse your body with warm water from the neck down.  8.  DO NOT shower/wash with your normal soap after using and rinsing off       the CHG Soap.  9.  Pat yourself dry with a clean towel.            10.  Wear clean pajamas.            11.  Place clean sheets on your bed the night of your first shower and do not        sleep with pets.  Day of Surgery  Do not apply any lotions/deoderants the morning of surgery.  Please wear clean clothes to the hospital/surgery center.     Please read over the following fact sheets that you were given: Pain Booklet, Coughing and Deep Breathing, Blood Transfusion Information, MRSA Information and Surgical Site Infection Prevention

## 2013-02-12 NOTE — Progress Notes (Addendum)
Pt doesn't have a cardiologist  Stress test done and Holter monitor worn at least 70yrs ago  Denies ever having an echo or heart cath    EKG in epic from 11-26-12  CXR in epic from 01-18-13  Medical MD is Dr.Katherine Tabori  Dr. Huey Bienenstock is a pulmonary specialist with the group [facility] is in  And saw him in Jan 2015

## 2013-02-13 LAB — URINE CULTURE
Colony Count: NO GROWTH
Culture: NO GROWTH

## 2013-02-15 NOTE — Progress Notes (Signed)
Anesthesia Chart Review:  Patient is a 80 year old female scheduled for right TKA on 02/22/13 by Dr. Ronnie Derby.  History includes PCP is Dr. Birdie Riddle. She saw patient on 11/26/12 for CPE and preoperative evaluation.  Notes indicate that she saw no reason for patient not to proceed. She is not followed by cardiology. Stress and Holter monitor were done > 15 years ago.   EKG on 11/26/12 showed SR, consider old anterior infarct, negative anterior T waves (I think fairly non-specific).  This EKG was already reviewed by Dr. Birdie Riddle at patient's pre-operative visit and ultimately patient was felt okay to proceed without further work-up ordered.  CXR on 01/14/13 showed: No active cardiopulmonary disease.  Preoperative labs noted.  If no acute changes then I anticipate that patient can proceed as planned.  [facility] Short Stay Center/Anesthesiology Phone [phone] 02/15/2013 1:24 PM

## 2013-02-21 MED ORDER — TRANEXAMIC ACID 100 MG/ML IV SOLN
1000.0000 mg | INTRAVENOUS | Status: AC
  Administered 2013-02-22: 1000 mg via INTRAVENOUS
  Filled 2013-02-21: qty 10

## 2013-02-21 MED ORDER — CEFAZOLIN SODIUM-DEXTROSE 2-3 GM-% IV SOLR
2.0000 g | INTRAVENOUS | Status: AC
  Administered 2013-02-22: 2 g via INTRAVENOUS
  Filled 2013-02-21: qty 50

## 2013-02-21 MED ORDER — BUPIVACAINE LIPOSOME 1.3 % IJ SUSP
20.0000 mL | Freq: Once | INTRAMUSCULAR | Status: DC
  Filled 2013-02-21: qty 20

## 2013-02-22 ENCOUNTER — Encounter (HOSPITAL_COMMUNITY): Payer: Medicare Other | Admitting: Vascular Surgery

## 2013-02-22 ENCOUNTER — Inpatient Hospital Stay (HOSPITAL_COMMUNITY): Payer: Medicare Other | Admitting: Anesthesiology

## 2013-02-22 ENCOUNTER — Encounter (HOSPITAL_COMMUNITY): Payer: Self-pay | Admitting: Anesthesiology

## 2013-02-22 ENCOUNTER — Other Ambulatory Visit: Payer: Self-pay | Admitting: Orthopedic Surgery

## 2013-02-22 ENCOUNTER — Inpatient Hospital Stay (HOSPITAL_COMMUNITY)
Admission: RE | Admit: 2013-02-22 | Discharge: 2013-02-25 | DRG: 470 | Disposition: A | Discharge status: Skilled Nursing Facility | Payer: Medicare Other | Source: Ambulatory Visit | Attending: Orthopedic Surgery | Admitting: Orthopedic Surgery

## 2013-02-22 ENCOUNTER — Encounter (HOSPITAL_COMMUNITY)
Admission: RE | Disposition: A | Discharge status: Skilled Nursing Facility | Payer: Self-pay | Source: Ambulatory Visit | Attending: Orthopedic Surgery

## 2013-02-22 DIAGNOSIS — Z96659 Presence of unspecified artificial knee joint: Secondary | ICD-10-CM

## 2013-02-22 DIAGNOSIS — M1710 Unilateral primary osteoarthritis, unspecified knee: Principal | ICD-10-CM | POA: Diagnosis present

## 2013-02-22 DIAGNOSIS — I872 Venous insufficiency (chronic) (peripheral): Secondary | ICD-10-CM | POA: Diagnosis present

## 2013-02-22 DIAGNOSIS — Z823 Family history of stroke: Secondary | ICD-10-CM

## 2013-02-22 DIAGNOSIS — E785 Hyperlipidemia, unspecified: Secondary | ICD-10-CM | POA: Diagnosis present

## 2013-02-22 DIAGNOSIS — Z8249 Family history of ischemic heart disease and other diseases of the circulatory system: Secondary | ICD-10-CM

## 2013-02-22 DIAGNOSIS — I252 Old myocardial infarction: Secondary | ICD-10-CM

## 2013-02-22 DIAGNOSIS — K219 Gastro-esophageal reflux disease without esophagitis: Secondary | ICD-10-CM | POA: Diagnosis present

## 2013-02-22 DIAGNOSIS — G8929 Other chronic pain: Secondary | ICD-10-CM | POA: Diagnosis present

## 2013-02-22 DIAGNOSIS — I10 Essential (primary) hypertension: Secondary | ICD-10-CM | POA: Diagnosis present

## 2013-02-22 DIAGNOSIS — Z85820 Personal history of malignant melanoma of skin: Secondary | ICD-10-CM

## 2013-02-22 DIAGNOSIS — R351 Nocturia: Secondary | ICD-10-CM | POA: Diagnosis present

## 2013-02-22 DIAGNOSIS — M549 Dorsalgia, unspecified: Secondary | ICD-10-CM | POA: Diagnosis present

## 2013-02-22 DIAGNOSIS — D62 Acute posthemorrhagic anemia: Secondary | ICD-10-CM | POA: Diagnosis not present

## 2013-02-22 HISTORY — PX: JOINT REPLACEMENT: SHX530

## 2013-02-22 HISTORY — PX: TOTAL KNEE ARTHROPLASTY: SHX125

## 2013-02-22 LAB — CBC
HCT: 36.2 % (ref 36.0–46.0)
Hemoglobin: 12.5 g/dL (ref 12.0–15.0)
MCH: 30.8 pg (ref 26.0–34.0)
MCHC: 34.5 g/dL (ref 30.0–36.0)
MCV: 89.2 fL (ref 78.0–100.0)
Platelets: 299 10*3/uL (ref 150–400)
RBC: 4.06 MIL/uL (ref 3.87–5.11)
RDW: 13.2 % (ref 11.5–15.5)
WBC: 19.2 10*3/uL — ABNORMAL HIGH (ref 4.0–10.5)

## 2013-02-22 LAB — CREATININE, SERUM
Creatinine, Ser: 0.95 mg/dL (ref 0.50–1.10)
GFR calc Af Amer: 65 mL/min — ABNORMAL LOW (ref >90)
GFR calc non Af Amer: 56 mL/min — ABNORMAL LOW (ref >90)

## 2013-02-22 SURGERY — ARTHROPLASTY, KNEE, TOTAL
Anesthesia: General | Site: Knee | Laterality: Right

## 2013-02-22 MED ORDER — METOCLOPRAMIDE HCL 10 MG PO TABS: 5.0000 mg | ORAL_TABLET | Freq: Three times a day (TID) | ORAL | Status: DC | PRN

## 2013-02-22 MED ORDER — FENTANYL CITRATE 0.05 MG/ML IJ SOLN
INTRAMUSCULAR | Status: DC | PRN
  Administered 2013-02-22 (×2): 50 ug via INTRAVENOUS
  Administered 2013-02-22: 100 ug via INTRAVENOUS
  Administered 2013-02-22: 50 ug via INTRAVENOUS

## 2013-02-22 MED ORDER — LACTATED RINGERS IV SOLN: INTRAVENOUS | Status: DC

## 2013-02-22 MED ORDER — PHENOL 1.4 % MT LIQD: 1.0000 | OROMUCOSAL | Status: DC | PRN

## 2013-02-22 MED ORDER — PHENYLEPHRINE HCL 10 MG/ML IJ SOLN
INTRAMUSCULAR | Status: DC | PRN
  Administered 2013-02-22 (×7): 80 ug via INTRAVENOUS

## 2013-02-22 MED ORDER — EZETIMIBE 10 MG PO TABS
10.0000 mg | ORAL_TABLET | Freq: Every day | ORAL | Status: DC
  Administered 2013-02-22 – 2013-02-24 (×3): 10 mg via ORAL
  Filled 2013-02-22 (×4): qty 1

## 2013-02-22 MED ORDER — HYDROMORPHONE HCL PF 1 MG/ML IJ SOLN
1.0000 mg | INTRAMUSCULAR | Status: DC | PRN
  Administered 2013-02-22 – 2013-02-23 (×7): 1 mg via INTRAVENOUS
  Filled 2013-02-22 (×7): qty 1

## 2013-02-22 MED ORDER — BUPIVACAINE LIPOSOME 1.3 % IJ SUSP
INTRAMUSCULAR | Status: DC | PRN
  Administered 2013-02-22: 20 mL

## 2013-02-22 MED ORDER — HYDROMORPHONE HCL PF 1 MG/ML IJ SOLN
INTRAMUSCULAR | Status: AC
  Filled 2013-02-22: qty 1

## 2013-02-22 MED ORDER — PHENYLEPHRINE 40 MCG/ML (10ML) SYRINGE FOR IV PUSH (FOR BLOOD PRESSURE SUPPORT)
PREFILLED_SYRINGE | INTRAVENOUS | Status: AC
  Filled 2013-02-22: qty 10

## 2013-02-22 MED ORDER — DOCUSATE SODIUM 100 MG PO CAPS
100.0000 mg | ORAL_CAPSULE | Freq: Two times a day (BID) | ORAL | Status: DC
Start: 2013-02-22 — End: 2013-02-25
  Administered 2013-02-22 – 2013-02-25 (×7): 100 mg via ORAL
  Filled 2013-02-22 (×8): qty 1

## 2013-02-22 MED ORDER — BUPIVACAINE-EPINEPHRINE (PF) 0.5% -1:200000 IJ SOLN
INTRAMUSCULAR | Status: AC
  Filled 2013-02-22: qty 10

## 2013-02-22 MED ORDER — NEOSTIGMINE METHYLSULFATE 1 MG/ML IJ SOLN
INTRAMUSCULAR | Status: DC | PRN
  Administered 2013-02-22: 4 mg via INTRAVENOUS

## 2013-02-22 MED ORDER — FENTANYL CITRATE 0.05 MG/ML IJ SOLN
50.0000 ug | Freq: Once | INTRAMUSCULAR | Status: DC
  Filled 2013-02-22: qty 2

## 2013-02-22 MED ORDER — SODIUM CHLORIDE 0.9 % IR SOLN
Status: DC | PRN
  Administered 2013-02-22 (×2): 1000 mL

## 2013-02-22 MED ORDER — LIDOCAINE HCL (CARDIAC) 20 MG/ML IV SOLN
INTRAVENOUS | Status: DC | PRN
  Administered 2013-02-22: 100 mg via INTRAVENOUS

## 2013-02-22 MED ORDER — METHOCARBAMOL 500 MG PO TABS
500.0000 mg | ORAL_TABLET | Freq: Four times a day (QID) | ORAL | Status: DC | PRN
  Administered 2013-02-22 – 2013-02-24 (×5): 500 mg via ORAL
  Filled 2013-02-22 (×5): qty 1

## 2013-02-22 MED ORDER — OXYCODONE HCL 5 MG PO TABS
5.0000 mg | ORAL_TABLET | ORAL | Status: DC | PRN
  Administered 2013-02-22 – 2013-02-25 (×5): 10 mg via ORAL
  Filled 2013-02-22 (×5): qty 2

## 2013-02-22 MED ORDER — ALUM & MAG HYDROXIDE-SIMETH 200-200-20 MG/5ML PO SUSP
30.0000 mL | ORAL | Status: DC | PRN
  Filled 2013-02-22: qty 30

## 2013-02-22 MED ORDER — DIPHENHYDRAMINE HCL 12.5 MG/5ML PO ELIX: 12.5000 mg | ORAL_SOLUTION | ORAL | Status: DC | PRN

## 2013-02-22 MED ORDER — ACETAMINOPHEN 650 MG RE SUPP: 650.0000 mg | Freq: Four times a day (QID) | RECTAL | Status: DC | PRN

## 2013-02-22 MED ORDER — LACTATED RINGERS IV SOLN
INTRAVENOUS | Status: DC | PRN
  Administered 2013-02-22 (×2): via INTRAVENOUS

## 2013-02-22 MED ORDER — ONDANSETRON HCL 4 MG/2ML IJ SOLN
4.0000 mg | Freq: Four times a day (QID) | INTRAMUSCULAR | Status: DC | PRN
  Administered 2013-02-22 – 2013-02-23 (×2): 4 mg via INTRAVENOUS
  Filled 2013-02-22 (×2): qty 2

## 2013-02-22 MED ORDER — ACETAMINOPHEN 325 MG PO TABS
650.0000 mg | ORAL_TABLET | Freq: Four times a day (QID) | ORAL | Status: DC | PRN
Start: 2013-02-22 — End: 2013-02-25

## 2013-02-22 MED ORDER — GLYCOPYRROLATE 0.2 MG/ML IJ SOLN
INTRAMUSCULAR | Status: AC
  Filled 2013-02-22: qty 3

## 2013-02-22 MED ORDER — METHOCARBAMOL 100 MG/ML IJ SOLN
500.0000 mg | Freq: Four times a day (QID) | INTRAMUSCULAR | Status: DC | PRN
  Filled 2013-02-22: qty 5

## 2013-02-22 MED ORDER — FLEET ENEMA 7-19 GM/118ML RE ENEM: 1.0000 | ENEMA | Freq: Once | RECTAL | Status: AC | PRN

## 2013-02-22 MED ORDER — MORPHINE SULFATE 10 MG/ML IJ SOLN
INTRAMUSCULAR | Status: AC
  Filled 2013-02-22: qty 1

## 2013-02-22 MED ORDER — ONDANSETRON HCL 4 MG/2ML IJ SOLN
INTRAMUSCULAR | Status: AC
  Filled 2013-02-22: qty 2

## 2013-02-22 MED ORDER — OXYCODONE HCL ER 10 MG PO T12A: 10.0000 mg | EXTENDED_RELEASE_TABLET | Freq: Two times a day (BID) | ORAL | Status: DC

## 2013-02-22 MED ORDER — FENTANYL CITRATE 0.05 MG/ML IJ SOLN
INTRAMUSCULAR | Status: AC
  Filled 2013-02-22: qty 5

## 2013-02-22 MED ORDER — HYDROMORPHONE HCL PF 1 MG/ML IJ SOLN
0.2500 mg | INTRAMUSCULAR | Status: DC | PRN
  Administered 2013-02-22 (×2): 0.5 mg via INTRAVENOUS
  Administered 2013-02-22 (×2): 0.25 mg via INTRAVENOUS

## 2013-02-22 MED ORDER — SODIUM CHLORIDE 0.9 % IV SOLN: INTRAVENOUS | Status: DC

## 2013-02-22 MED ORDER — ONDANSETRON HCL 4 MG/2ML IJ SOLN
INTRAMUSCULAR | Status: DC | PRN
  Administered 2013-02-22: 4 mg via INTRAVENOUS

## 2013-02-22 MED ORDER — DEXAMETHASONE SODIUM PHOSPHATE 10 MG/ML IJ SOLN
INTRAMUSCULAR | Status: DC | PRN
  Administered 2013-02-22: 4 mg via INTRAVENOUS

## 2013-02-22 MED ORDER — ONDANSETRON HCL 4 MG/2ML IJ SOLN: 4.0000 mg | Freq: Once | INTRAMUSCULAR | Status: DC | PRN

## 2013-02-22 MED ORDER — BISACODYL 5 MG PO TBEC: 5.0000 mg | DELAYED_RELEASE_TABLET | Freq: Every day | ORAL | Status: DC | PRN

## 2013-02-22 MED ORDER — GLYCOPYRROLATE 0.2 MG/ML IJ SOLN
INTRAMUSCULAR | Status: AC
  Filled 2013-02-22: qty 2

## 2013-02-22 MED ORDER — HYDROCHLOROTHIAZIDE 25 MG PO TABS
25.0000 mg | ORAL_TABLET | Freq: Every day | ORAL | Status: DC
  Administered 2013-02-22 – 2013-02-24 (×3): 25 mg via ORAL
  Filled 2013-02-22 (×4): qty 1

## 2013-02-22 MED ORDER — BUPIVACAINE-EPINEPHRINE 0.5% -1:200000 IJ SOLN
INTRAMUSCULAR | Status: DC | PRN
  Administered 2013-02-22: 30 mL

## 2013-02-22 MED ORDER — DEXAMETHASONE SODIUM PHOSPHATE 4 MG/ML IJ SOLN
INTRAMUSCULAR | Status: AC
  Filled 2013-02-22: qty 1

## 2013-02-22 MED ORDER — MENTHOL 3 MG MT LOZG: 1.0000 | LOZENGE | OROMUCOSAL | Status: DC | PRN

## 2013-02-22 MED ORDER — ROCURONIUM BROMIDE 100 MG/10ML IV SOLN
INTRAVENOUS | Status: DC | PRN
  Administered 2013-02-22: 30 mg via INTRAVENOUS

## 2013-02-22 MED ORDER — SENNOSIDES-DOCUSATE SODIUM 8.6-50 MG PO TABS: 1.0000 | ORAL_TABLET | Freq: Every evening | ORAL | Status: DC | PRN

## 2013-02-22 MED ORDER — ONDANSETRON HCL 4 MG PO TABS
4.0000 mg | ORAL_TABLET | Freq: Four times a day (QID) | ORAL | Status: DC | PRN
  Filled 2013-02-22: qty 1

## 2013-02-22 MED ORDER — PROPOFOL 10 MG/ML IV BOLUS
INTRAVENOUS | Status: DC | PRN
  Administered 2013-02-22: 200 mg via INTRAVENOUS

## 2013-02-22 MED ORDER — METOCLOPRAMIDE HCL 5 MG/ML IJ SOLN
INTRAMUSCULAR | Status: DC | PRN
  Administered 2013-02-22: 5 mg via INTRAVENOUS

## 2013-02-22 MED ORDER — PANTOPRAZOLE SODIUM 40 MG PO TBEC
40.0000 mg | DELAYED_RELEASE_TABLET | Freq: Every day | ORAL | Status: DC
  Administered 2013-02-23 – 2013-02-25 (×3): 40 mg via ORAL
  Filled 2013-02-22 (×3): qty 1

## 2013-02-22 MED ORDER — ENOXAPARIN SODIUM 30 MG/0.3ML ~~LOC~~ SOLN
30.0000 mg | Freq: Two times a day (BID) | SUBCUTANEOUS | Status: DC
  Administered 2013-02-23 – 2013-02-25 (×5): 30 mg via SUBCUTANEOUS
  Filled 2013-02-22 (×7): qty 0.3

## 2013-02-22 MED ORDER — NEOSTIGMINE METHYLSULFATE 1 MG/ML IJ SOLN
INTRAMUSCULAR | Status: AC
  Filled 2013-02-22: qty 10

## 2013-02-22 MED ORDER — METOCLOPRAMIDE HCL 5 MG/ML IJ SOLN
INTRAMUSCULAR | Status: AC
  Filled 2013-02-22: qty 2

## 2013-02-22 MED ORDER — METOCLOPRAMIDE HCL 5 MG/ML IJ SOLN: 5.0000 mg | Freq: Three times a day (TID) | INTRAMUSCULAR | Status: DC | PRN

## 2013-02-22 MED ORDER — PROPOFOL 10 MG/ML IV BOLUS
INTRAVENOUS | Status: AC
  Filled 2013-02-22: qty 20

## 2013-02-22 MED ORDER — ROCURONIUM BROMIDE 50 MG/5ML IV SOLN
INTRAVENOUS | Status: AC
  Filled 2013-02-22: qty 1

## 2013-02-22 MED ORDER — GLYCOPYRROLATE 0.2 MG/ML IJ SOLN
INTRAMUSCULAR | Status: DC | PRN
  Administered 2013-02-22: 0.6 mg via INTRAVENOUS

## 2013-02-22 MED ORDER — CEFAZOLIN SODIUM-DEXTROSE 2-3 GM-% IV SOLR
2.0000 g | Freq: Four times a day (QID) | INTRAVENOUS | Status: AC
  Administered 2013-02-22 (×2): 2 g via INTRAVENOUS
  Filled 2013-02-22 (×2): qty 50

## 2013-02-22 SURGICAL SUPPLY — 56 items
BANDAGE ESMARK 6X9 LF (GAUZE/BANDAGES/DRESSINGS) ×1 IMPLANT
BLADE SAGITTAL 13X1.27X60 (BLADE) ×2 IMPLANT
BLADE SAGITTAL 13X1.27X60MM (BLADE) ×1
BLADE SAW SGTL 83.5X18.5 (BLADE) ×3 IMPLANT
BNDG ESMARK 6X9 LF (GAUZE/BANDAGES/DRESSINGS) ×3
BOWL SMART MIX CTS (DISPOSABLE) ×3 IMPLANT
CAP POR TM CP VIT E LN CER HD ×3 IMPLANT
CEMENT BONE SIMPLEX SPEEDSET (Cement) ×6 IMPLANT
COVER SURGICAL LIGHT HANDLE (MISCELLANEOUS) ×3 IMPLANT
CUFF TOURNIQUET SINGLE 34IN LL (TOURNIQUET CUFF) ×3 IMPLANT
DRAPE EXTREMITY T 121X128X90 (DRAPE) ×3 IMPLANT
DRAPE INCISE IOBAN 66X45 STRL (DRAPES) ×6 IMPLANT
DRAPE PROXIMA HALF (DRAPES) ×3 IMPLANT
DRAPE U-SHAPE 47X51 STRL (DRAPES) ×3 IMPLANT
DRSG ADAPTIC 3X8 NADH LF (GAUZE/BANDAGES/DRESSINGS) ×3 IMPLANT
DRSG PAD ABDOMINAL 8X10 ST (GAUZE/BANDAGES/DRESSINGS) ×3 IMPLANT
DURAPREP 26ML APPLICATOR (WOUND CARE) ×6 IMPLANT
ELECT REM PT RETURN 9FT ADLT (ELECTROSURGICAL) ×3
ELECTRODE REM PT RTRN 9FT ADLT (ELECTROSURGICAL) ×1 IMPLANT
EVACUATOR 1/8 PVC DRAIN (DRAIN) ×3 IMPLANT
GLOVE BIOGEL M 7.0 STRL (GLOVE) ×3 IMPLANT
GLOVE BIOGEL PI IND STRL 7.5 (GLOVE) ×1 IMPLANT
GLOVE BIOGEL PI IND STRL 8.5 (GLOVE) ×2 IMPLANT
GLOVE BIOGEL PI INDICATOR 7.5 (GLOVE) ×2
GLOVE BIOGEL PI INDICATOR 8.5 (GLOVE) ×4
GLOVE BIOGEL PI ORTHO PRO 7.5 (GLOVE) ×2
GLOVE PI ORTHO PRO STRL 7.5 (GLOVE) ×1 IMPLANT
GLOVE SURG ORTHO 8.0 STRL STRW (GLOVE) ×6 IMPLANT
GLOVE SURG SS PI 7.5 STRL IVOR (GLOVE) ×3 IMPLANT
GOWN PREVENTION PLUS XLARGE (GOWN DISPOSABLE) ×6 IMPLANT
GOWN STRL NON-REIN LRG LVL3 (GOWN DISPOSABLE) ×6 IMPLANT
HANDPIECE INTERPULSE COAX TIP (DISPOSABLE) ×2
HOOD PEEL AWAY FACE SHEILD DIS (HOOD) ×9 IMPLANT
KIT BASIN OR (CUSTOM PROCEDURE TRAY) ×3 IMPLANT
KIT ROOM TURNOVER OR (KITS) ×3 IMPLANT
MANIFOLD NEPTUNE II (INSTRUMENTS) ×3 IMPLANT
NEEDLE 22X1 1/2 (OR ONLY) (NEEDLE) ×3 IMPLANT
NS IRRIG 1000ML POUR BTL (IV SOLUTION) IMPLANT
PACK TOTAL JOINT (CUSTOM PROCEDURE TRAY) ×3 IMPLANT
PAD ARMBOARD 7.5X6 YLW CONV (MISCELLANEOUS) ×3 IMPLANT
PADDING CAST COTTON 6X4 STRL (CAST SUPPLIES) ×3 IMPLANT
SET HNDPC FAN SPRY TIP SCT (DISPOSABLE) ×1 IMPLANT
SPONGE GAUZE 4X4 12PLY (GAUZE/BANDAGES/DRESSINGS) ×3 IMPLANT
STAPLER VISISTAT 35W (STAPLE) ×3 IMPLANT
SUCTION FRAZIER TIP 10 FR DISP (SUCTIONS) ×3 IMPLANT
SUT BONE WAX W31G (SUTURE) ×3 IMPLANT
SUT VIC AB 0 CTB1 27 (SUTURE) ×6 IMPLANT
SUT VIC AB 1 CT1 27 (SUTURE) ×6
SUT VIC AB 1 CT1 27XBRD ANBCTR (SUTURE) ×3 IMPLANT
SUT VIC AB 2-0 CT1 27 (SUTURE) ×4
SUT VIC AB 2-0 CT1 TAPERPNT 27 (SUTURE) ×2 IMPLANT
SYR CONTROL 10ML LL (SYRINGE) ×3 IMPLANT
TOWEL OR 17X24 6PK STRL BLUE (TOWEL DISPOSABLE) ×3 IMPLANT
TOWEL OR 17X26 10 PK STRL BLUE (TOWEL DISPOSABLE) ×3 IMPLANT
TRAY FOLEY CATH 14FR (SET/KITS/TRAYS/PACK) IMPLANT
WATER STERILE IRR 1000ML POUR (IV SOLUTION) ×3 IMPLANT

## 2013-02-22 NOTE — H&P (Signed)
Kristen Manning MRN:  [id number] DOB/SEX:  September 19, 1932/female  CHIEF COMPLAINT:  Painful right Knee  HISTORY: Patient is a 80 y.o. female presented with a history of pain in the right knee. Onset of symptoms was gradual starting several years ago with gradually worsening course since that time. Prior procedures on the knee include arthroscopy. Patient has been treated conservatively with over-the-counter NSAIDs and activity modification. Patient currently rates pain in the knee at 10 out of 10 with activity. There is pain at night.  PAST MEDICAL HISTORY: Patient Active Problem List   Diagnosis Date Noted  . Arthritis of knee, right 11/26/2012  . Routine general medical examination at a health care facility 10/17/2011  . Sinusitis 09/19/2011  . HTN (hypertension) 07/03/2011  . Hyperlipidemia 07/03/2011  . Venous insufficiency, peripheral 07/03/2011   Past Medical History  Diagnosis Date  . Arthritis   . Hyperlipidemia     takes Zetia daily  . Urinary incontinence   . Cancer     skin cancer, back, legs melonama  . GERD (gastroesophageal reflux disease)     takes Omeprazole daily  . Hypertension     takes HCTZ daily  . PONV (postoperative nausea and vomiting)   . Myocardial infarction     pt states EKG always shows infarct but no change from previous ekg;never knew anything about it  . Headache(784.0)   . Bacterial infection     in lungs in Jan 2015  . Shortness of breath     with exertion  . Pneumonia     hx of-many yrs ago  . History of migraine     last one about 15 yrs ago  . Osteoarthritis   . Joint swelling   . Joint pain   . Chronic back pain   . Urinary frequency   . Urinary urgency   . Nocturia    Past Surgical History  Procedure Laterality Date  . Knee surgery Bilateral 2009 and 2011  . Bladder tack  1998  . Tonsillectomy  as a child ? date  . Oophorectomy  1998    only one  . Joint replacement Left 10/02/2009  . Blepharoplasty Bilateral       MEDICATIONS:   Prescriptions prior to admission  Medication Sig Dispense Refill  . hydrochlorothiazide (HYDRODIURIL) 25 MG tablet take 1 tablet by mouth once daily  30 tablet  3  . omeprazole (PRILOSEC) 20 MG capsule Take 1 capsule (20 mg total) by mouth daily.  30 capsule  3  . ZETIA 10 MG tablet take 1 tablet by mouth once daily  30 tablet  5    ALLERGIES:   Allergies  Allergen Reactions  . Codeine Hives  . Aspirin     Noted caused 2 holes in retna, not sure     REVIEW OF SYSTEMS:  Pertinent items are noted in HPI.   FAMILY HISTORY:   Family History  Problem Relation Age of Onset  . Kidney disease Mother   . Alcohol abuse Father   . Stroke Father   . Alcohol abuse Brother   . Hyperlipidemia Brother   . Hypertension Brother   . Early death Brother     SOCIAL HISTORY:   History  Substance Use Topics  . Smoking status: Never Smoker   . Smokeless tobacco: Not on file  . Alcohol Use: No     EXAMINATION:  Vital signs in last 24 hours: Temp:  [97.4 F (36.3 C)] 97.4 F (36.3 C) (02/16 4098) Pulse  Rate:  [84] 84 (02/16 0652) Resp:  [18] 18 (02/16 0652) BP: (175)/(84) 175/84 mmHg (02/16 0652) SpO2:  [99 %] 99 % (02/16 0652)  General appearance: alert, cooperative and no distress Lungs: clear to auscultation bilaterally Heart: regular rate and rhythm, S1, S2 normal, no murmur, click, rub or gallop Abdomen: soft, non-tender; bowel sounds normal; no masses,  no organomegaly Extremities: extremities normal, atraumatic, no cyanosis or edema and Homans sign is negative, no sign of DVT Pulses: 2+ and symmetric Skin: Skin color, texture, turgor normal. No rashes or lesions Neurologic: Alert and oriented X 3, normal strength and tone. Normal symmetric reflexes. Normal coordination and gait  Musculoskeletal:  ROM 0-100, Ligaments intact,  Imaging Review Plain radiographs demonstrate severe degenerative joint disease of the right knee. The overall alignment is mild  valgus. The bone quality appears to be good for age and reported activity level.  Assessment/Plan: End stage arthritis, right knee   The patient history, physical examination and imaging studies are consistent with advanced degenerative joint disease of the right knee. The patient has failed conservative treatment.  The clearance notes were reviewed.  After discussion with the patient it was felt that Total Knee Replacement was indicated. The procedure,  risks, and benefits of total knee arthroplasty were presented and reviewed. The risks including but not limited to aseptic loosening, infection, blood clots, vascular injury, stiffness, patella tracking problems complications among others were discussed. The patient acknowledged the explanation, agreed to proceed with the plan.  Omari Mcmanaway 02/22/2013, 7:05 AM

## 2013-02-22 NOTE — Preoperative (Signed)
Beta Blockers   Reason not to administer Beta Blockers:Not Applicable 

## 2013-02-22 NOTE — Anesthesia Procedure Notes (Addendum)
Anesthesia Regional Block:  Femoral nerve block  Pre-Anesthetic Checklist: ,, timeout performed, Correct Patient, Correct Site, Correct Laterality, Correct Procedure, Correct Position, site marked, Risks and benefits discussed,  Surgical consent,  Pre-op evaluation,  At surgeon's request and post-op pain management  Laterality: Right  Prep: chloraprep and alcohol swabs       Needles:  Injection technique: Single-shot  Needle Type: Stimulator Needle - 80          Additional Needles: Femoral nerve block  Nerve Stimulator or Paresthesia:  Response: 0.5 mA, 0.1 ms, 5 cm  Additional Responses:   Narrative:  Start time: 02/22/2013 8:20 AM End time: 02/22/2013 8:25 AM Injection made incrementally with aspirations every 5 mL.  Performed by: Personally  Anesthesiologist: Sharolyn Douglas MD  Additional Notes: Pt accepts procedure w/ risks. 15cc 0.5% Marcaine w/ epi w/o difficulty or discomfort. Pt tolerated well. GES   Procedure Name: Intubation Date/Time: 02/22/2013 7:35 AM Performed by: Neldon Newport Pre-anesthesia Checklist: Patient identified, Timeout performed, Emergency Drugs available, Suction available and Patient being monitored Patient Re-evaluated:Patient Re-evaluated prior to inductionOxygen Delivery Method: Circle system utilized Preoxygenation: Pre-oxygenation with 100% oxygen Intubation Type: IV induction Ventilation: Mask ventilation without difficulty Laryngoscope Size: Mac and 3 (First DL by Holley Bouche EMT student) Grade View: Grade II Tube type: Oral Tube size: 7.5 mm Number of attempts: 2 Placement Confirmation: positive ETCO2,  ETT inserted through vocal cords under direct vision and breath sounds checked- equal and bilateral Secured at: 21 cm Tube secured with: Tape Dental Injury: Teeth and Oropharynx as per pre-operative assessment

## 2013-02-22 NOTE — Anesthesia Postprocedure Evaluation (Signed)
  Anesthesia Post-op Note  Patient: Kristen Manning  Procedure(s) Performed: Procedure(s): TOTAL KNEE ARTHROPLASTY (Right)  Patient Location: PACU  Anesthesia Type:General and GA combined with regional for post-op pain  Level of Consciousness: awake, alert  and oriented  Airway and Oxygen Therapy: Patient Spontanous Breathing  Post-op Pain: mild  Post-op Assessment: Post-op Vital signs reviewed, Patient's Cardiovascular Status Stable, Respiratory Function Stable, Patent Airway and Pain level controlled  Post-op Vital Signs: stable  Complications: No apparent anesthesia complications

## 2013-02-22 NOTE — Progress Notes (Addendum)
Physical Therapy Evaluation Patient Details Name: Kristen Manning MRN: [id number] DOB: 1932-05-05 Today's Date: 02/22/2013 Time: 8295-6213 (and 15:20-15:30) PT Time Calculation (min): 31 min + 10 min = total 41 min  PT Assessment / Plan / Recommendation History of Present Illness  Patient is a 80 yo female s/p Rt TKA.  Clinical Impression  Patient presents with problems listed below.  Will benefit from acute PT to maximize independence prior to discharge home with family.  Session limited today by nausea.    PT Assessment  Patient needs continued PT services    Follow Up Recommendations  Home health PT;Supervision/Assistance - 24 hour    Does the patient have the potential to tolerate intense rehabilitation      Barriers to Discharge        Equipment Recommendations  Other (comment) (Patient requesting toilet riser - defer to OT)    Recommendations for Other Services     Frequency 7X/week    Precautions / Restrictions Precautions Precautions: Knee Precaution Booklet Issued: Yes (comment) Precaution Comments: Educated patient on precautions. Restrictions Weight Bearing Restrictions: Yes RLE Weight Bearing: Weight bearing as tolerated   Pertinent Vitals/Pain       Mobility  Bed Mobility Overal bed mobility: Needs Assistance Bed Mobility: Supine to Sit;Sit to Supine Supine to sit: Mod assist Sit to supine: Mod assist General bed mobility comments: Verbal cues for technique.  Assist to move RLE off of bed and to raise trunk to sitting.  Once upright, patient with good sitting balance.  Patient sat EOB x 10 minutes.  Patient became nauseated - returned to supine with assist to raise LE's onto bed.    Exercises Total Joint Exercises Ankle Circles/Pumps: AROM;Both;10 reps;Supine   PT Diagnosis: Difficulty walking;Acute pain  PT Problem List: Decreased strength;Decreased range of motion;Decreased activity tolerance;Decreased balance;Decreased mobility;Decreased  knowledge of use of DME;Decreased safety awareness;Decreased knowledge of precautions;Obesity;Pain PT Treatment Interventions: DME instruction;Gait training;Stair training;Functional mobility training;Therapeutic exercise;Patient/family education     PT Goals(Current goals can be found in the care plan section) Acute Rehab PT Goals Patient Stated Goal: To feel better PT Goal Formulation: With patient/family Time For Goal Achievement: 03/01/13 Potential to Achieve Goals: Good  Visit Information  Last PT Received On: 02/22/13 Assistance Needed: +2 History of Present Illness: Patient is a 80 yo female s/p Rt TKA.       Prior [place] expects to be discharged to:: Private residence Living Arrangements: Other relatives (Lives with brother) Available Help at Discharge: Family;Available 24 hours/day (Brother 24/7; Daughter and son to help out) Type of Home: House Home Access: Stairs to enter Technical brewer of Steps: 1 Entrance Stairs-Rails: None Home Layout: One level Home Equipment: Environmental consultant - 2 wheels;Cane - quad;Bedside commode;Shower seat Prior Function Level of Independence: Independent Comments: Driving pta. Communication Communication: No difficulties    Cognition  Cognition Arousal/Alertness: Awake/alert Behavior During Therapy: Anxious Overall Cognitive Status: Within Functional Limits for tasks assessed    Extremity/Trunk Assessment Upper Extremity Assessment Upper Extremity Assessment: Generalized weakness Lower Extremity Assessment Lower Extremity Assessment: RLE deficits/detail RLE Deficits / Details: Decreased strength and ROM due to surgery/pain. RLE: Unable to fully assess due to pain RLE Coordination: decreased gross motor   Balance Balance Overall balance assessment: Needs assistance Sitting-balance support: Single extremity supported;Feet supported Sitting balance-Leahy Scale: Fair Sitting balance - Comments: Patient  sat EOB x 10 minutes.  End of Session PT - End of Session Equipment Utilized During Treatment: Oxygen Activity Tolerance: Patient limited  by pain;Patient limited by fatigue (Limited by nausea) Patient left: in bed;with call bell/phone within reach;with family/visitor present Nurse Communication: Mobility status;Patient requests pain meds CPM Right Knee CPM Right Knee: Off (off at 15:30)  GP     Despina Pole 02/22/2013, 6:53 PM Carita Pian. Sanjuana Kava, [place] Pager [phone]

## 2013-02-22 NOTE — Progress Notes (Signed)
Utilization review completed.  

## 2013-02-22 NOTE — Anesthesia Preprocedure Evaluation (Addendum)
Anesthesia Evaluation  Patient identified by MRN, date of birth, ID band Patient awake    Reviewed: Allergy & Precautions, H&P , NPO status , Patient's Chart, lab work & pertinent test results  History of Anesthesia Complications (+) PONV and history of anesthetic complications  Airway Mallampati: I TM Distance: >3 FB Neck ROM: full    Dental  (+) Dental Advidsory Given   Pulmonary shortness of breath and with exertion,          Cardiovascular hypertension, + CAD, + Past MI and + Peripheral Vascular Disease     Neuro/Psych  Headaches,    GI/Hepatic GERD-  ,  Endo/Other    Renal/GU      Musculoskeletal   Abdominal   Peds  Hematology   Anesthesia Other Findings   Reproductive/Obstetrics                          Anesthesia Physical Anesthesia Plan  ASA: III  Anesthesia Plan: General   Post-op Pain Management:    Induction: Intravenous  Airway Management Planned:   Additional Equipment:   Intra-op Plan:   Post-operative Plan: Extubation in OR  Informed Consent: I have reviewed the patients History and Physical, chart, labs and discussed the procedure including the risks, benefits and alternatives for the proposed anesthesia with the patient or authorized representative who has indicated his/her understanding and acceptance.   Dental Advisory Given  Plan Discussed with: Anesthesiologist, CRNA and Surgeon  Anesthesia Plan Comments:        Anesthesia Quick Evaluation

## 2013-02-22 NOTE — Progress Notes (Signed)
Orthopedic Tech Progress Note Patient Details:  Kristen Manning 1932-08-21 [id number]  CPM Right Knee CPM Right Knee: On Right Knee Flexion (Degrees): 90 Right Knee Extension (Degrees): 0 Additional Comments: Trapeze bar and foot roll   Cammer, Theodoro Parma 02/22/2013, 11:36 AM

## 2013-02-22 NOTE — Transfer of Care (Signed)
Immediate Anesthesia Transfer of Care Note  Patient: Kristen Manning  Procedure(s) Performed: Procedure(s): TOTAL KNEE ARTHROPLASTY (Right)  Patient Location: PACU  Anesthesia Type:General  Level of Consciousness: awake, alert  and oriented  Airway & Oxygen Therapy: Patient Spontanous Breathing and Patient connected to nasal cannula oxygen  Post-op Assessment: Report given to PACU RN, Post -op Vital signs reviewed and stable and Patient moving all extremities X 4  Post vital signs: Reviewed and stable  Complications: No apparent anesthesia complications

## 2013-02-23 ENCOUNTER — Encounter (HOSPITAL_COMMUNITY): Payer: Self-pay | Admitting: General Practice

## 2013-02-23 ENCOUNTER — Other Ambulatory Visit: Payer: Self-pay | Admitting: Orthopedic Surgery

## 2013-02-23 LAB — BASIC METABOLIC PANEL
BUN: 16 mg/dL (ref 6–23)
CO2: 21 mEq/L (ref 19–32)
Calcium: 8.5 mg/dL (ref 8.4–10.5)
Chloride: 98 mEq/L (ref 96–112)
Creatinine, Ser: 0.95 mg/dL (ref 0.50–1.10)
GFR calc Af Amer: 65 mL/min — ABNORMAL LOW (ref >90)
GFR calc non Af Amer: 56 mL/min — ABNORMAL LOW (ref >90)
Glucose, Bld: 90 mg/dL (ref 70–99)
Potassium: 4.5 mEq/L (ref 3.7–5.3)
Sodium: 134 mEq/L — ABNORMAL LOW (ref 137–147)

## 2013-02-23 LAB — CBC
HCT: 32.6 % — ABNORMAL LOW (ref 36.0–46.0)
Hemoglobin: 11 g/dL — ABNORMAL LOW (ref 12.0–15.0)
MCH: 30.4 pg (ref 26.0–34.0)
MCHC: 33.7 g/dL (ref 30.0–36.0)
MCV: 90.1 fL (ref 78.0–100.0)
Platelets: 285 10*3/uL (ref 150–400)
RBC: 3.62 MIL/uL — ABNORMAL LOW (ref 3.87–5.11)
RDW: 13.6 % (ref 11.5–15.5)
WBC: 18.4 10*3/uL — ABNORMAL HIGH (ref 4.0–10.5)

## 2013-02-23 MED ORDER — HYDROMORPHONE HCL 2 MG PO TABS
2.0000 mg | ORAL_TABLET | ORAL | Status: DC | PRN
  Administered 2013-02-23 – 2013-02-24 (×4): 2 mg via ORAL
  Filled 2013-02-23 (×4): qty 1

## 2013-02-23 NOTE — Progress Notes (Signed)
SPORTS MEDICINE AND JOINT REPLACEMENT  Kristen Mulch, MD   Carlynn Spry, PA-C [address] [place], [place], [place]  [address]                             [phone]   PROGRESS NOTE  Subjective:  negative for Chest Pain  negative for Shortness of Breath  negative for Nausea/Vomiting   negative for Calf Pain  negative for Bowel Movement   Tolerating Diet: yes         Patient reports pain as 8 on 0-10 scale.    Objective: Vital signs in last 24 hours:   Patient Vitals for the past 24 hrs:  BP Temp Temp src Pulse Resp SpO2  02/23/13 0359 125/61 mmHg 98.4 F (36.9 C) Oral 90 18 98 %  02/23/13 0025 112/46 mmHg 98.1 F (36.7 C) Oral 81 20 95 %  02/22/13 1922 119/45 mmHg 97.9 F (36.6 C) Oral 70 18 92 %  02/22/13 1838 130/60 mmHg 97.5 F (36.4 C) - 84 16 93 %  02/22/13 1531 - - - - 18 94 %  02/22/13 1301 132/73 mmHg 97.5 F (36.4 C) - 96 20 94 %  02/22/13 1215 127/61 mmHg 97.6 F (36.4 C) - 90 20 95 %  02/22/13 1200 146/56 mmHg - - 71 11 95 %  02/22/13 1145 145/59 mmHg - - 67 10 93 %  02/22/13 1130 150/61 mmHg - - 94 14 93 %  02/22/13 1115 108/58 mmHg - - 77 13 93 %  02/22/13 1100 122/66 mmHg - - 90 19 95 %  02/22/13 1043 99/66 mmHg 98 F (36.7 C) - 99 17 96 %    @flow {1959:LAST@   Intake/Output from previous day:   02/16 0701 - 02/17 0700 In: 3240 [P.O.:480; I.V.:2760] Out: 308 [Urine:250; Drains:8]   Intake/Output this shift:       Intake/Output     02/16 0701 - 02/17 0700 02/17 0701 - 02/18 0700   P.O. 480    I.V. 2760    Total Intake 3240     Urine 250    Drains 8    Blood 50    Total Output 308     Net +2932          Urine Occurrence 4 x    Emesis Occurrence 1 x       LABORATORY DATA:  Recent Labs  02/22/13 1505 02/23/13 0420  WBC 19.2* 18.4*  HGB 12.5 11.0*  HCT 36.2 32.6*  PLT 299 285    Recent Labs  02/22/13 1505 02/23/13 0420  NA  --  134*  K  --  4.5  CL  --  98  CO2  --  21  BUN  --  16  CREATININE 0.95 0.95  GLUCOSE   --  90  CALCIUM  --  8.5   Lab Results  Component Value Date   INR 1.04 02/12/2013   INR 1.04 01/11/2013   INR 1.00 09/27/2009    Examination:  General appearance: alert, cooperative and no distress Extremities: Homans sign is negative, no sign of DVT  Wound Exam: clean, dry, intact   Drainage:  Scant/small amount Serosanguinous exudate  Motor Exam: EHL and FHL Intact  Sensory Exam: Ulnar normal   Assessment:    1 Day Post-Op  Procedure(s) (LRB): TOTAL KNEE ARTHROPLASTY (Right)  ADDITIONAL DIAGNOSIS:  Active Problems:   S/P total knee arthroplasty  Acute Blood Loss  Anemia   Plan: Physical Therapy as ordered Weight Bearing as Tolerated (WBAT)  DVT Prophylaxis:  Lovenox  DISCHARGE PLAN: Home  DISCHARGE NEEDS: HHPT, CPM, Walker and 3-in-1 comode seat         Safire Gordin 02/23/2013, 10:16 AM

## 2013-02-23 NOTE — Care Management Note (Signed)
CARE MANAGEMENT NOTE 02/23/2013  Patient:  FANCHON, PAPANIA   Account Number:  [id number]  Date Initiated:  02/23/2013  Documentation initiated by:  Ricki Miller  Subjective/Objective Assessment:   80 yr old female s/p right total knee arthroplasty.     Action/Plan:   Case manager spoke with patient concerning home health and DME needs at discharge. Patient preoperatively setup with [facility], no changes. rolling walker, 3in1 and CPM have been delivered to patient's home.   Anticipated DC Date:  02/24/2013   Anticipated DC Plan:  [place]  CM consult      [facility] Choice  HOME HEALTH  DURABLE MEDICAL EQUIPMENT   Choice offered to / List presented to:  C-1 Patient      DME agency  [employer]     [place] arranged  HH-2 PT      [facility] agency  [facility]   Status of service:  Completed, signed off Medicare Important Message given?   (If response is "NO", the following Medicare IM given date fields will be blank) Date Medicare IM given:   Date Additional Medicare IM given:    Discharge Disposition:  [place]  Per UR Regulation:    If discussed at Long Length of Stay Meetings, dates discussed:    Comments:

## 2013-02-23 NOTE — Progress Notes (Signed)
Orthopedic Tech Progress Note Patient Details:  Kristen Manning Jul 03, 1932 [id number] Off cpm at 7:55 pm Patient ID: Kristen Manning, female   DOB: 06-Nov-1932, 80 y.o.   MRN: [id number]   Braulio Bosch 02/23/2013, 7:54 PM

## 2013-02-23 NOTE — Progress Notes (Signed)
Orthopedic Tech Progress Note Patient Details:  Kristen Manning [birth year] [id number]  Ortho Devices Type of Ortho Device: Knee Immobilizer Ortho Device/Splint Interventions: Application   Cammer, Theodoro Parma 02/23/2013, 11:49 AM

## 2013-02-23 NOTE — Op Note (Signed)
TOTAL KNEE REPLACEMENT OPERATIVE NOTE:  02/22/2013  9:26 AM  PATIENT:  Kristen Manning  80 y.o. female  PRE-OPERATIVE DIAGNOSIS:  osteoarthritis right total knee  POST-OPERATIVE DIAGNOSIS:  osteoarthritis right total knee  PROCEDURE:  Procedure(s): TOTAL KNEE ARTHROPLASTY  SURGEON:  Surgeon(s): Vickey Huger, MD  PHYSICIAN ASSISTANT: Carlynn Spry, [facility]  ANESTHESIA:   general  DRAINS: Hemovac  SPECIMEN: None  COUNTS:  Correct  TOURNIQUET:   Total Tourniquet Time Documented: Thigh (Right) - 56 minutes Total: Thigh (Right) - 56 minutes   DICTATION:  Indication for procedure:    The patient is a 80 y.o. female who has failed conservative treatment for osteoarthritis right total knee.  Informed consent was obtained prior to anesthesia. The risks versus benefits of the operation were explain and in a way the patient can, and did, understand.   On the implant demand matching protocol, this patient scored 9.  Therefore, this patient was not receive a polyethylene insert with vitamin E which is a high demand implant.  Description of procedure:     The patient was taken to the operating room and placed under anesthesia.  The patient was positioned in the usual fashion taking care that all body parts were adequately padded and/or protected.  I foley catheter was not placed.  A tourniquet was applied and the leg prepped and draped in the usual sterile fashion.  The extremity was exsanguinated with the esmarch and tourniquet inflated to 350 mmHg.  Pre-operative range of motion was normal.  The knee was in 5 degree of mild varus.  A midline incision approximately 6-7 inches long was made with a #10 blade.  A new blade was used to make a parapatellar arthrotomy going 2-3 cm into the quadriceps tendon, over the patella, and alongside the medial aspect of the patellar tendon.  A synovectomy was then performed with the #10 blade and forceps. I then elevated the deep MCL off the medial  tibial metaphysis subperiosteally around to the semimembranosus attachment.    I everted the patella and used calipers to measure patellar thickness.  I used the reamer to ream down to appropriate thickness to recreate the native thickness.  I then removed excess bone with the rongeur and sagittal saw.  I used the appropriately sized template and drilled the three lug holes.  I then put the trial in place and measured the thickness with the calipers to ensure recreation of the native thickness.  The trial was then removed and the patella subluxed and the knee brought into flexion.  A homan retractor was place to retract and protect the patella and lateral structures.  A Z-retractor was place medially to protect the medial structures.  The extra-medullary alignment system was used to make cut the tibial articular surface perpendicular to the anamotic axis of the tibia and in 3 degrees of posterior slope.  The cut surface and alignment jig was removed.  I then used the intramedullary alignment guide to make a 6 valgus cut on the distal femur.  I then marked out the epicondylar axis on the distal femur.  The posterior condylar axis measured 5 degrees.  I then used the anterior referencing sizer and measured the femur to be a size 8.  The 4-In-1 cutting block was screwed into place in external rotation matching the posterior condylar angle, making our cuts perpendicular to the epicondylar axis.  Anterior, posterior and chamfer cuts were made with the sagittal saw.  The cutting block and cut pieces were  removed.  A lamina spreader was placed in 90 degrees of flexion.  The ACL, PCL, menisci, and posterior condylar osteophytes were removed.  A 10 mm spacer blocked was found to offer good flexion and extension gap balance after minimal in degree releasing.   The scoop retractor was then placed and the femoral finishing block was pinned in place.  The small sagittal saw was used as well as the lug drill to finish the  femur.  The block and cut surfaces were removed and the medullary canal hole filled with autograft bone from the cut pieces.  The tibia was delivered forward in deep flexion and external rotation.  A size D tray was selected and pinned into place centered on the medial 1/3 of the tibial tubercle.  The reamer and keel was used to prepare the tibia through the tray.    I then trialed with the size 8 femur, size D tibia, a 10 mm insert and the 32 patella.  I had excellent flexion/extension gap balance, excellent patella tracking.  Flexion was full and beyond 120 degrees; extension was zero.  These components were chosen and the staff opened them to me on the back table while the knee was lavaged copiously and the cement mixed.  The soft tissue was infiltrated with 60cc of exparel 1.3% through a 21 gauge needle.  I cemented in the components and removed all excess cement.  The polyethylene tibial component was snapped into place and the knee placed in extension while cement was hardening.  The capsule was infilltrated with 30cc of .25% Marcaine with epinephrine.  A hemovac was place in the joint exiting superolaterally.  A pain pump was place superomedially superficial to the arthrotomy.  Once the cement was hard, the tourniquet was let down.  Hemostasis was obtained.  The arthrotomy was closed with figure-8 #1 vicryl sutures.  The deep soft tissues were closed with #0 vicryls and the subcuticular layer closed with a running #2-0 vicryl.  The skin was reapproximated and closed with skin staples.  The wound was dressed with xeroform, 4 x4's, 2 ABD sponges, a single layer of webril and a TED stocking.   The patient was then awakened, extubated, and taken to the recovery room in stable condition.  BLOOD LOSS:  300cc DRAINS: 1 hemovac, 1 pain catheter COMPLICATIONS:  None.  PLAN OF CARE: Admit to inpatient   PATIENT DISPOSITION:  PACU - hemodynamically stable.   Delay start of Pharmacological VTE agent  (>24hrs) due to surgical blood loss or risk of bleeding:  not applicable  Please fax a copy of this op note to my office at [phone] (please only include page 1 and 2 of the Case Information op note)

## 2013-02-23 NOTE — Progress Notes (Signed)
Physical Therapy Treatment Patient Details Name: Kristen Manning MRN: [id number] DOB: 13-Jan-1932 Today's Date: 02/23/2013 Time: 0925-1007 PT Time Calculation (min): 42 min  PT Assessment / Plan / Recommendation  History of Present Illness Patient is a 80 yo female s/p Rt TKA.   PT Comments   Making slow progress with mobility, with R knee stance instability significantly limiting safety with Ramond Marrow, PA, aware -- will try KI for stance stabiltiy next session  Follow Up Recommendations  Home health PT;Supervision/Assistance - 24 hour     Does the patient have the potential to tolerate intense rehabilitation     Barriers to Discharge        Equipment Recommendations       Recommendations for Other Services    Frequency 7X/week   Progress towards PT Goals Progress towards PT goals: Progressing toward goals (slowly)  Plan Current plan remains appropriate    Precautions / Restrictions Precautions Precautions: Knee Precaution Comments: Educated patient on precautions. Required Braces or Orthoses: Knee Immobilizer - Right (for stance stability) Knee Immobilizer - Right: On when out of bed or walking Restrictions RLE Weight Bearing: Weight bearing as tolerated   Pertinent Vitals/Pain 6/10 R knee patient repositioned for comfort and optimal knee ext     Mobility  Bed Mobility Overal bed mobility: Needs Assistance Bed Mobility: Supine to Sit Supine to sit: Mod assist General bed mobility comments: Verbal cues for technique.  Assist to move RLE off of bed and to raise trunk to sitting.  Once upright, patient with good sitting balance.   Transfers Overall transfer level: Needs assistance Equipment used: Rolling walker (2 wheeled) Transfers: Sit to/from Stand Sit to Stand: Mod assist;+2 physical assistance General transfer comment: Cues for hand placement, safety, and technique; Requires physical assist to power up, and is heavily dependent on LLE and UE support; R knee  tending to buckle in [facility] Ambulation/Gait Ambulation/Gait assistance: +2 physical assistance;Mod assist Ambulation Distance (Feet): 3 Feet (pivot steps bed to [facility]) Assistive device: Rolling walker (2 wheeled) Gait Pattern/deviations: Decreased stance time - right General Gait Details: L knee quite unstable in stance with noted buckling L and significantly increased fall risk    Exercises Total Joint Exercises Quad Sets:  (Attempted; no significant quad activation noted)   PT Diagnosis:    PT Problem List:   PT Treatment Interventions:     PT Goals (current goals can now be found in the care plan section) Acute Rehab PT Goals Patient Stated Goal: To feel better PT Goal Formulation: With patient/family Time For Goal Achievement: 03/01/13 Potential to Achieve Goals: Good  Visit Information  Last PT Received On: 02/23/13 Assistance Needed: +2 History of Present Illness: Patient is a 80 yo female s/p Rt TKA.    Subjective Data  Subjective: Reports she plans to use medical Lucianne Lei transport to get home Patient Stated Goal: To feel better   Cognition  Cognition Arousal/Alertness: Awake/alert Behavior During Therapy: Anxious Overall Cognitive Status: Within Functional Limits for tasks assessed    Balance     End of Session PT - End of Session Activity Tolerance: Patient tolerated treatment well;Other (comment) (concerned for safety with R knee buckle) Patient left: in chair;with call bell/phone within reach Nurse Communication: Mobility status   GP     Roney Marion [facility] [place], [place]  02/23/2013, 1:17 PM

## 2013-02-23 NOTE — Progress Notes (Signed)
Physical Therapy Treatment Patient Details Name: Kristen Manning MRN: [id number] DOB: 1932-11-13 Today's Date: 02/23/2013 Time: 9735-3299 PT Time Calculation (min): 24 min  PT Assessment / Plan / Recommendation  History of Present Illness Patient is a 80 yo female s/p Rt TKA.   PT Comments   Pt tolerated treatment well, performing 3 bouts of sit<>stand with Mod assist, twice from elevated surface. Ambulated 15 feet with Mod assist +2 using a rolling walker, on room air. Frequent verbal cues for foot placement and to increase stance time and stability on R  Follow Up Recommendations  Home health PT;Supervision/Assistance - 24 hour     Does the patient have the potential to tolerate intense rehabilitation     Barriers to Discharge        Equipment Recommendations  None recommended by PT (pt requesting toilet riser; defer to OT)    Recommendations for Other Services    Frequency 7X/week   Progress towards PT Goals Progress towards PT goals: Progressing toward goals  Plan Current plan remains appropriate    Precautions / Restrictions Precautions Precautions: Knee Precaution Comments: Step sequence with RW Required Braces or Orthoses: Knee Immobilizer - Right Knee Immobilizer - Right: On when out of bed or walking Restrictions Weight Bearing Restrictions: Yes RLE Weight Bearing: Weight bearing as tolerated   Pertinent Vitals/Pain Ambulated on Room Air; sats 93%; restarted supplemental O2 Pain R knee; did not rate  RN provided medication to assist with pain control     Mobility  Bed Mobility Overal bed mobility: Needs Assistance Bed Mobility: Sit to Sidelying Supine to sit: Mod assist General bed mobility comments: Verbal cues for technique. Assist to move RLE onto bed. Transfers Overall transfer level: Needs assistance Equipment used: Rolling walker (2 wheeled) Transfers: Sit to/from Stand Sit to Stand: Mod assist General transfer comment: Cues for hand placement,  safety, and technique; Requires physical assist to power up, and is heavily dependent on LLE and UE support. Ambulation/Gait Ambulation/Gait assistance: +2 physical assistance;Mod assist Ambulation Distance (Feet): 15 Feet Assistive device: Rolling walker (2 wheeled) Gait Pattern/deviations: Step-to pattern;Decreased step length - right;Trunk flexed General Gait Details: R knee with improved stablity with knee immobilizer. Frequent cues for foot placement and upright posture, as pt bends anteriorly at waist.     Exercises     PT Diagnosis:    PT Problem List:   PT Treatment Interventions:     PT Goals (current goals can now be found in the care plan section) Acute Rehab PT Goals PT Goal Formulation: With patient/family Time For Goal Achievement: 03/01/13 Potential to Achieve Goals: Good  Visit Information  Last PT Received On: 02/23/13 Assistance Needed: +2 History of Present Illness: Patient is a 80 yo female s/p Rt TKA.    Subjective Data  Subjective: Pt states she has been sitting in recliner for 40 minutes   Cognition  Cognition Arousal/Alertness: Awake/alert Behavior During Therapy: WFL for tasks assessed/performed Overall Cognitive Status: Within Functional Limits for tasks assessed    Balance     End of Session PT - End of Session Activity Tolerance: Patient tolerated treatment well;Other (comment) (Improved R knee stability with knee immobilizer.) Patient left: in bed;with call bell/phone within reach Nurse Communication: Other (comment) (To be placed in CPM)   [place], [place], [place]  02/23/2013, 4:34 PM

## 2013-02-24 LAB — URINALYSIS W MICROSCOPIC (NOT AT ARMC)
Bilirubin Urine: NEGATIVE
Glucose, UA: NEGATIVE mg/dL
Ketones, ur: NEGATIVE mg/dL
Leukocytes, UA: NEGATIVE
Nitrite: NEGATIVE
Protein, ur: NEGATIVE mg/dL
Specific Gravity, Urine: 1.014 (ref 1.005–1.030)
Urobilinogen, UA: 0.2 mg/dL (ref 0.0–1.0)
pH: 5 (ref 5.0–8.0)

## 2013-02-24 LAB — CBC
HCT: 30.2 % — ABNORMAL LOW (ref 36.0–46.0)
Hemoglobin: 10.5 g/dL — ABNORMAL LOW (ref 12.0–15.0)
MCH: 31.2 pg (ref 26.0–34.0)
MCHC: 34.8 g/dL (ref 30.0–36.0)
MCV: 89.6 fL (ref 78.0–100.0)
Platelets: 304 10*3/uL (ref 150–400)
RBC: 3.37 MIL/uL — ABNORMAL LOW (ref 3.87–5.11)
RDW: 13.9 % (ref 11.5–15.5)
WBC: 14.7 10*3/uL — ABNORMAL HIGH (ref 4.0–10.5)

## 2013-02-24 MED ORDER — HYDROMORPHONE HCL 2 MG PO TABS: 2.0000 mg | ORAL_TABLET | ORAL | Status: DC | PRN

## 2013-02-24 MED ORDER — ENOXAPARIN SODIUM 40 MG/0.4ML ~~LOC~~ SOLN: 40.0000 mg | SUBCUTANEOUS | Status: DC

## 2013-02-24 MED ORDER — METHOCARBAMOL 500 MG PO TABS: 500.0000 mg | ORAL_TABLET | Freq: Four times a day (QID) | ORAL | Status: DC | PRN

## 2013-02-24 NOTE — Progress Notes (Signed)
Physical Therapy Treatment Patient Details Name: Kristen Manning MRN: [id number] DOB: 1932-06-26 Today's Date: 02/24/2013 Time: 2119-4174 PT Time Calculation (min): 24 min  PT Assessment / Plan / Recommendation  History of Present Illness Patient is a 80 yo female s/p Rt TKA.   PT Comments   Slow progress; Lengthy discussion re: going to SNF for postacute rehabilitation to maximize independence and safety with mobility prior to dc home  Follow Up Recommendations  SNF     Does the patient have the potential to tolerate intense rehabilitation     Barriers to Discharge        Equipment Recommendations  None recommended by PT    Recommendations for Other Services    Frequency 7X/week   Progress towards PT Goals Progress towards PT goals: Progressing toward goals (Slowly)  Plan Discharge plan needs to be updated    Precautions / Restrictions Precautions Precautions: Knee Precaution Booklet Issued: Yes (comment) Precaution Comments: Step sequence with RW Required Braces or Orthoses: Knee Immobilizer - Right Knee Immobilizer - Right: On when out of bed or walking Restrictions Weight Bearing Restrictions: Yes RLE Weight Bearing: Weight bearing as tolerated   Pertinent Vitals/Pain Pain R knee; did not rate patient repositioned for comfort in CPM    Mobility  Bed Mobility Overal bed mobility: Needs Assistance Bed Mobility: Sit to Supine Supine to sit: Mod assist Sit to supine: Mod assist General bed mobility comments: Verbal cues for technique. Assist to move RLE onto bed. Transfers Overall transfer level: Needs assistance Equipment used: Rolling walker (2 wheeled) Transfers: Sit to/from Stand Sit to Stand: Mod assist General transfer comment: Cues for hand placement, safety, and technique; Requires physical assist to power up, and is heavily dependent on LLE and UE support.Continues to need facilitation for anterior weight shift of center of mass onto base of support  (feet) Ambulation/Gait Ambulation/Gait assistance: +2 safety/equipment;Mod assist Ambulation Distance (Feet): 15 Feet Assistive device: Rolling walker (2 wheeled) Gait Pattern/deviations: Step-to pattern General Gait Details: R knee with improved stablity with knee immobilizer. Frequent cues for foot placement and upright posture, as pt bends anteriorly at waist. Decr activity tolerance,    Exercises Total Joint Exercises Quad Sets: AAROM;Right;10 reps (minimal quad activation, if any) Heel Slides: AAROM;Left;5 reps (cued to push against manualtracking resistence for incr quad)   PT Diagnosis:    PT Problem List:   PT Treatment Interventions:     PT Goals (current goals can now be found in the care plan section) Acute Rehab PT Goals Patient Stated Goal: to get back home  PT Goal Formulation: With patient/family Time For Goal Achievement: 03/01/13 Potential to Achieve Goals: Good  Visit Information  Last PT Received On: 02/24/13 Assistance Needed: +2 (+1 therex and simple transfer) History of Present Illness: Patient is a 80 yo female s/p Rt TKA.    Subjective Data  Subjective: Pt shows insight that she will likely not be able to manage well at home at her current functional level Patient Stated Goal: to get back home    Cognition  Cognition Arousal/Alertness: Awake/alert Behavior During Therapy: WFL for tasks assessed/performed Overall Cognitive Status: Within Functional Limits for tasks assessed    Balance     End of Session PT - End of Session Equipment Utilized During Treatment: Right knee immobilizer Activity Tolerance: Patient tolerated treatment well;Patient limited by fatigue Patient left: in bed;in CPM;with call bell/phone within reach;with nursing/sitter in room Nurse Communication: Mobility status;Other (comment) (PT in agreement with dc to SNF)  CPM Right Knee CPM Right Knee: On Right Knee Flexion (Degrees): 55 Right Knee Extension (Degrees): 0   GP      Roney Marion [place], [place]  02/24/2013, 4:22 PM

## 2013-02-24 NOTE — Discharge Instructions (Signed)
Home Health physical therapy to be provided by [facility] [phone]  Diet: As you were doing prior to hospitalization   Activity:  Increase activity slowly as tolerated                  No lifting or driving for 6 weeks  Shower:  May shower without a dressing once there is no drainage from your wound.                 Do NOT wash over the wound.                 Dressing:  You may change your dressing on friday                    Then change the dressing daily with sterile 4"x4"s gauze dressing                     And TED hose for knees.  Weight Bearing:  Weight bearing as tolerated as taught in physical therapy.  Use a                                walker or Crutches as instructed.  To prevent constipation: you may use a stool softener such as -               Colace ( over the counter) 100 mg by mouth twice a day                Drink plenty of fluids ( prune juice may be helpful) and high fiber foods                Miralax ( over the counter) for constipation as needed.    Precautions:  If you experience chest pain or shortness of breath - call 911 immediately               For transfer to the hospital emergency department!!               If you develop a fever greater that 101 F, purulent drainage from wound,                             increased redness or drainage from wound, or calf pain -- Call the office.  Follow- Up Appointment:  Please call for an appointment to be seen on 03/09/13                                              [facility] office:  [phone]            [address] [place], [place] [address]

## 2013-02-24 NOTE — Progress Notes (Signed)
Physical Therapy Treatment Note  SATURATION QUALIFICATIONS: (This note is used to comply with regulatory documentation for home oxygen)  Patient Saturations on Room Air at Rest = 89-92%  Patient Saturations on Room Air while Ambulating = 87%  Patient Saturations on 2 Liters of oxygen post Ambulating = 94%  Please briefly explain why patient needs home oxygen: Required supplemental O2 to keep sats at acceptable levels  Full PT treatment note to follow,  Kenney Houseman, [place]'

## 2013-02-24 NOTE — Evaluation (Signed)
Occupational Therapy Evaluation Patient Details Name: Kristen Manning MRN: [id number] DOB: Jun 28, 1932 Today's Date: 02/24/2013 Time: 2505-3976 OT Time Calculation (min): 31 min  OT Assessment / Plan / Recommendation History of present illness 80 yo female s/p Rt TKA with KI   Clinical Impression   Patient is s/p Rt TKA with KI surgery resulting in functional limitations due to the deficits listed below (see OT problem list). Pt at baseline has incontinence and incr risk for skin break down. Patient will benefit from skilled OT acutely to increase independence and safety with ADLS to allow discharge [place] with 3n1 for toileting. Ot focus next session: AE education for LB dressing/ bathing/ toilet transfer- if time tub bench      OT Assessment  Patient needs continued OT Services    Follow Up Recommendations  Home health OT    Barriers to Discharge      Equipment Recommendations  3 in 1 bedside comode    Recommendations for Other Services    Frequency  Min 3X/week    Precautions / Restrictions Precautions Precautions: Knee Required Braces or Orthoses: Knee Immobilizer - Right Knee Immobilizer - Right: On when out of bed or walking Restrictions RLE Weight Bearing: Weight bearing as tolerated   Pertinent Vitals/Pain 7 out 10 premedicated and pain descending at end of session Ice applied to Rt LE    ADL  Eating/Feeding: Modified independent Where Assessed - Eating/Feeding: Chair Grooming: Wash/dry hands;Wash/dry face;Supervision/safety Where Assessed - Grooming: Supported sitting Lower Body Dressing: Maximal assistance Where Assessed - Lower Body Dressing: Supported sit to Lobbyist: Moderate assistance Toilet Transfer Method: Sit to stand Toilet Transfer Equipment: Raised toilet seat with arms (or 3-in-1 over toilet) Toileting - Clothing Manipulation and Hygiene: Maximal assistance Where Assessed - Toileting Clothing Manipulation and Hygiene: Sit to stand  from 3-in-1 or toilet Equipment Used: Gait belt Transfers/Ambulation Related to ADLs: pt ambulated ~8 ft from EOB <>stand <> chair. pt needed max cueing to prevent return to chair uncontrolled descend. pt attempting to sit prior to bil Le in prior positioning. pt with max  cueing completed task safely  ADL Comments: pt noted to have incontinence of bladder on arrival. pt states "i must have sweat and spilled bed pan" Pt required mod (A) for bed mobility and will need (A) to complete transfers upon d/c. pt educated on removal of over head trapeze and HOB flat to simulate home. pt able to use bil UE to push into static standing. pt educated on proper positioning and wear schedule for KI. pt positioned in chair at end of session adn ice applied to Rt knee.    OT Diagnosis: Generalized weakness;Acute pain  OT Problem List: Decreased strength;Decreased activity tolerance;Impaired balance (sitting and/or standing);Decreased safety awareness;Decreased knowledge of use of DME or AE;Decreased knowledge of precautions;Pain;Increased edema OT Treatment Interventions: Self-care/ADL training;DME and/or AE instruction;Therapeutic activities;Patient/family education;Balance training   OT Goals(Current goals can be found in the care plan section) Acute Rehab OT Goals Patient Stated Goal: to get back home  OT Goal Formulation: With patient Time For Goal Achievement: 03/10/13 Potential to Achieve Goals: Good  Visit Information  Last OT Received On: 02/24/13 Assistance Needed: +1 (+2 ambulation- small transfer in room +1 ) History of Present Illness: 80 yo female s/p Rt TKA with KI       Prior [place] expects to be discharged to:: Private residence Living Arrangements: Other relatives Available Help at Discharge: Family;Available 24 hours/day  Type of Home: House Home Access: Stairs to enter [employer] of Steps: 1 Entrance Stairs-Rails: None Home Layout:  One level Home Equipment: Walker - 2 wheels;Cane - quad;Bedside commode;Tub bench;Hand held shower head Additional Comments: pt reports 3n1 is old and needs another 3n1 for over the toilet. Pt has daughter help her x3 days per week with tub bench transfer. pt requires pad for urinary incontinence  Prior Function Level of Independence: Independent Comments: Driving pta. Communication Communication: No difficulties Dominant Hand: Right         Vision/Perception Vision - History Baseline Vision: Wears glasses all the time Perception Perception: Within Functional Limits Praxis Praxis: Intact   Cognition  Cognition Arousal/Alertness: Awake/alert Behavior During Therapy: WFL for tasks assessed/performed Overall Cognitive Status: Within Functional Limits for tasks assessed    Extremity/Trunk Assessment Upper Extremity Assessment Upper Extremity Assessment: Overall WFL for tasks assessed Lower Extremity Assessment Lower Extremity Assessment: Defer to PT evaluation Cervical / Trunk Assessment Cervical / Trunk Assessment: Normal     Mobility Bed Mobility Overal bed mobility: Needs Assistance Bed Mobility: Supine to Sit Supine to sit: Mod assist General bed mobility comments: verbal / tacile cues. facilitation to rotate hips to EOB. pt able to use BIL UE to progress to EOB Transfers Overall transfer level: Needs assistance Equipment used: Rolling walker (2 wheeled) (KI) Transfers: Sit to/from Stand Sit to Stand: Min guard General transfer comment: bil Ue on bed surface and min (A) to facilitate anterior weight shift to progress to standing     Exercise Total Joint Exercises Ankle Circles/Pumps: AROM;Both;15 reps;Seated (pt automatic and repeating from education 2/17)   Balance     End of Session OT - End of Session Equipment Utilized During Treatment: Gait belt;Right knee immobilizer;Rolling walker Activity Tolerance: Patient tolerated treatment well Patient left: in  chair;with call bell/phone within reach Nurse Communication: Mobility status;Precautions CPM Right Knee CPM Right Knee: Off  GO     Peri Maris 02/24/2013, 9:06 AM Pager: [phone]

## 2013-02-24 NOTE — Progress Notes (Signed)
Physical Therapy Treatment Patient Details Name: Kristen Manning MRN: [id number] DOB: 07/17/1932 Today's Date: 02/24/2013 Time: 4098-1191 PT Time Calculation (min): 30 min  PT Assessment / Plan / Recommendation  History of Present Illness Patient is a 80 yo female s/p Rt TKA.   PT Comments   Making slow progress, cannot manage safely at home at current functional level; Still, this is pt's second TKA surgery, and she was able to dc home safely with 24 hour assist last time (per pt, her LOS was 4 days), so it is still reasonable that she'll be able to dc home after this surgery; Will continue to monitor progress, and remain hopeful that pt will make adequate progress to dc safely home with family assist  Required supplemental O2 to keep O2 sats at acceptable levels this session; RN notified  Follow Up Recommendations  Home health PT;Supervision/Assistance - 24 hour     Does the patient have the potential to tolerate intense rehabilitation     Barriers to Discharge        Equipment Recommendations  None recommended by PT (pt requesting toilet riser; defer to OT)    Recommendations for Other Services    Frequency 7X/week   Progress towards PT Goals Progress towards PT goals: Progressing toward goals  Plan Current plan remains appropriate    Precautions / Restrictions Precautions Precautions: Knee Precaution Comments: Step sequence with RW Required Braces or Orthoses: Knee Immobilizer - Right Knee Immobilizer - Right: On when out of bed or walking Restrictions Weight Bearing Restrictions: Yes RLE Weight Bearing: Weight bearing as tolerated   Pertinent Vitals/Pain Very painful with flexion range knee; did not rate  See also previous PT note re: oxygen saturations    Mobility  Bed Mobility Overal bed mobility: Needs Assistance Bed Mobility: Sit to Sidelying Supine to sit: Mod assist General bed mobility comments: Verbal cues for technique. Assist to move RLE onto  bed. Transfers Overall transfer level: Needs assistance Equipment used: Rolling walker (2 wheeled) Transfers: Sit to/from Stand Sit to Stand: Min assist General transfer comment: Cues for hand placement, safety, and technique; Requires physical assist to power up, and is heavily dependent on LLE and UE support. Ambulation/Gait Ambulation/Gait assistance: +2 safety/equipment;Mod assist Ambulation Distance (Feet): 18 Feet Assistive device: Rolling walker (2 wheeled) Gait Pattern/deviations: Step-to pattern;Decreased step length - left;Decreased stance time - right General Gait Details: R knee with improved stablity with knee immobilizer. Frequent cues for foot placement and upright posture, as pt bends anteriorly at waist. Decr activity tolerance, with pt requesting to sit -- max encouragement to make it to door    Exercises Total Joint Exercises Quad Sets: AAROM;Right;10 reps (minimal quad activation, if any) Heel Slides: AAROM;Left;5 reps (cued to push against manualtracking resistence for incr quad)   PT Diagnosis:    PT Problem List:   PT Treatment Interventions:     PT Goals (current goals can now be found in the care plan section) Acute Rehab PT Goals Patient Stated Goal: to get back home  PT Goal Formulation: With patient/family Time For Goal Achievement: 03/01/13 Potential to Achieve Goals: Good  Visit Information  Last PT Received On: 02/24/13 Assistance Needed: +2 History of Present Illness: Patient is a 80 yo female s/p Rt TKA.    Subjective Data  Subjective: Pt shows insight that she will likely not be able to manage well at home at her current functional level Patient Stated Goal: to get back home    Cognition  Cognition  Arousal/Alertness: Awake/alert Behavior During Therapy: WFL for tasks assessed/performed Overall Cognitive Status: Within Functional Limits for tasks assessed    Balance     End of Session PT - End of Session Equipment Utilized During  Treatment: Right knee immobilizer (and monitored O2 sat on RA) Activity Tolerance: Patient tolerated treatment well;Other (comment) (Improved R knee stability with knee immobilizer.) Patient left: with call bell/phone within reach;in chair Nurse Communication: Mobility status;Other (comment) (need for significant progress in order to dc home)   GP     Roney Marion [facility] [place], [place]  02/24/2013, 1:56 PM

## 2013-02-24 NOTE — Progress Notes (Signed)
SPORTS MEDICINE AND JOINT REPLACEMENT  Kristen Mulch, MD   Carlynn Spry, PA-C [place], [place], [place]  [address]                             [phone]   PROGRESS NOTE  Subjective:  negative for Chest Pain  negative for Shortness of Breath  negative for Nausea/Vomiting   negative for Calf Pain  negative for Bowel Movement   Tolerating Diet: yes         Patient reports pain as 5 on 0-10 scale.    Objective: Vital signs in last 24 hours:   Patient Vitals for the past 24 hrs:  BP Temp Temp src Pulse Resp SpO2  02/24/13 1200 - - - - 18 92 %  02/24/13 0800 - - - - 20 92 %  02/24/13 0447 135/52 mmHg 98 F (36.7 C) Oral 93 20 92 %  02/24/13 0400 - - - - 18 94 %  02/24/13 0000 - - - - 20 91 %  02/23/13 2056 129/46 mmHg 98.5 F (36.9 C) Oral 99 18 93 %  02/23/13 2000 - - - - 20 93 %  02/23/13 1410 136/63 mmHg 98.7 F (37.1 C) Oral 93 18 98 %    @flow {1959:LAST@   Intake/Output from previous day:   02/17 0701 - 02/18 0700 In: 560 [P.O.:560] Out: 350 [Urine:350]   Intake/Output this shift:   02/18 0701 - 02/18 1900 In: 240 [P.O.:240] Out: 150 [Urine:150]   Intake/Output     02/17 0701 - 02/18 0700 02/18 0701 - 02/19 0700   P.O. 560 240   I.V.     Total Intake 560 240   Urine 350 150   Drains     Blood     Total Output 350 150   Net +210 +90        Urine Occurrence 5 x 1 x      LABORATORY DATA:  Recent Labs  02/22/13 1505 02/23/13 0420 02/24/13 0550  WBC 19.2* 18.4* 14.7*  HGB 12.5 11.0* 10.5*  HCT 36.2 32.6* 30.2*  PLT 299 285 304    Recent Labs  02/22/13 1505 02/23/13 0420  NA  --  134*  K  --  4.5  CL  --  98  CO2  --  21  BUN  --  16  CREATININE 0.95 0.95  GLUCOSE  --  90  CALCIUM  --  8.5   Lab Results  Component Value Date   INR 1.04 02/12/2013   INR 1.04 01/11/2013   INR 1.00 09/27/2009    Examination:  General appearance: alert, appears stated age and no distress Extremities: Homans sign is negative, no sign of  DVT  Wound Exam: clean, dry, intact   Drainage:  none  Motor Exam: EHL and FHL Intact  Sensory Exam: Deep Peroneal normal   Assessment:    2 Days Post-Op  Procedure(s) (LRB): TOTAL KNEE ARTHROPLASTY (Right)  ADDITIONAL DIAGNOSIS:  Active Problems:   S/P total knee arthroplasty  Acute Blood Loss Anemia   Plan: Physical Therapy as ordered Weight Bearing as Tolerated (WBAT)  DVT Prophylaxis:  Lovenox  DISCHARGE PLAN: Home  DISCHARGE NEEDS: HHPT, CPM, Walker and 3-in-1 comode seat         Frenchie Pribyl 02/24/2013, 12:41 PM

## 2013-02-24 NOTE — Progress Notes (Signed)
Pt complains of urinary urgency and increased frequency with low output.  Pt denies any pain or burning with urination.  UA ordered, urine collected.

## 2013-02-25 ENCOUNTER — Non-Acute Institutional Stay (SKILLED_NURSING_FACILITY): Payer: Medicare Other | Admitting: Internal Medicine

## 2013-02-25 ENCOUNTER — Encounter: Payer: Self-pay | Admitting: Internal Medicine

## 2013-02-25 DIAGNOSIS — E785 Hyperlipidemia, unspecified: Secondary | ICD-10-CM

## 2013-02-25 DIAGNOSIS — Z96659 Presence of unspecified artificial knee joint: Secondary | ICD-10-CM

## 2013-02-25 DIAGNOSIS — I10 Essential (primary) hypertension: Secondary | ICD-10-CM

## 2013-02-25 DIAGNOSIS — I872 Venous insufficiency (chronic) (peripheral): Secondary | ICD-10-CM

## 2013-02-25 LAB — CBC
HCT: 32.7 % — ABNORMAL LOW (ref 36.0–46.0)
Hemoglobin: 11 g/dL — ABNORMAL LOW (ref 12.0–15.0)
MCH: 30.1 pg (ref 26.0–34.0)
MCHC: 33.6 g/dL (ref 30.0–36.0)
MCV: 89.3 fL (ref 78.0–100.0)
Platelets: 242 10*3/uL (ref 150–400)
RBC: 3.66 MIL/uL — ABNORMAL LOW (ref 3.87–5.11)
RDW: 13.2 % (ref 11.5–15.5)
WBC: 18.9 10*3/uL — ABNORMAL HIGH (ref 4.0–10.5)

## 2013-02-25 NOTE — Assessment & Plan Note (Signed)
LDL 103 with HDL 55 2 weeks ago ;continue zetia

## 2013-02-25 NOTE — Progress Notes (Signed)
MRN: [id number] Name: Kristen Manning  Sex: female Age: 80 y.o. DOB: November 13, 1932  [place] #: Helene Kelp Facility/Room: 305A Level Of Care: SNF Provider: Inocencio Homes D Emergency Contacts: Extended Emergency Contact Information Primary Emergency Contact: [name] Address: [address]          [place], [place] [address] [name] Phone: [phone] Relation: Brother    Allergies: Codeine and Aspirin  Chief Complaint  Patient presents with  . nursing home admission    HPI: Patient is 80 y.o. female who just had R knee arthroplasty and is admitted for OT/PT.  Past Medical History  Diagnosis Date  . Arthritis   . Hyperlipidemia     takes Zetia daily  . Urinary incontinence   . Cancer     skin cancer, back, legs melonama  . GERD (gastroesophageal reflux disease)     takes Omeprazole daily  . Hypertension     takes HCTZ daily  . PONV (postoperative nausea and vomiting)   . Myocardial infarction     pt states EKG always shows infarct but no change from previous ekg;never knew anything about it  . Headache(784.0)   . Bacterial infection     in lungs in Jan 2015  . Shortness of breath     with exertion  . Pneumonia     hx of-many yrs ago  . History of migraine     last one about 15 yrs ago  . Osteoarthritis   . Joint swelling   . Joint pain   . Chronic back pain   . Urinary frequency   . Urinary urgency   . Nocturia     Past Surgical History  Procedure Laterality Date  . Knee surgery Bilateral 2009 and 2011  . Bladder tack  1998  . Tonsillectomy  as a child ? date  . Oophorectomy  1998    only one  . Joint replacement Left 10/02/2009  . Blepharoplasty Bilateral   . Total knee arthroplasty Right 02/22/2013    DR LUCEY  . Total knee arthroplasty Right 02/22/2013    Procedure: TOTAL KNEE ARTHROPLASTY;  Surgeon: Vickey Huger, MD;  Location: [facility];  Service: Orthopedics;  Laterality: Right;      Medication List       This list is  accurate as of: 02/25/13 11:59 PM.  Always use your most recent med list.               enoxaparin 40 MG/0.4ML injection  Commonly known as:  LOVENOX  Inject 0.4 mLs (40 mg total) into the skin daily.     hydrochlorothiazide 25 MG tablet  Commonly known as:  HYDRODIURIL  take 1 tablet by mouth once daily     HYDROmorphone 2 MG tablet  Commonly known as:  DILAUDID  Take 1 tablet (2 mg total) by mouth every 4 (four) hours as needed for moderate pain or severe pain.     methocarbamol 500 MG tablet  Commonly known as:  ROBAXIN  Take 1-2 tablets (500-1,000 mg total) by mouth every 6 (six) hours as needed for muscle spasms.     omeprazole 20 MG capsule  Commonly known as:  PRILOSEC  Take 1 capsule (20 mg total) by mouth daily.     ZETIA 10 MG tablet  Generic drug:  ezetimibe  take 1 tablet by mouth once daily        No orders of the defined types were placed in this encounter.    Immunization  History  Administered Date(s) Administered  . Td 11/26/2012    History  Substance Use Topics  . Smoking status: Never Smoker   . Smokeless tobacco: Never Used  . Alcohol Use: No    Family history is noncontributory    Review of Systems  DATA OBTAINED: from patient GENERAL: Feels well no fevers, fatigue, appetite changes SKIN: No itching, rash or wounds EYES: No eye pain, redness, discharge EARS: No earache, tinnitus, change in hearing NOSE: No congestion, drainage or bleeding  MOUTH/THROAT: No mouth or tooth pain, No sore throat, No difficulty chewing or swallowing  RESPIRATORY: No cough, wheezing, SOB CARDIAC: No chest pain, palpitations, lower extremity edema  GI: No abdominal pain, No N/V/D or constipation, No heartburn or reflux  GU: No dysuria, frequency or urgency, or incontinence  MUSCULOSKELETAL: Still significant R knee pain NEUROLOGIC: No headache, dizziness or focal weakness PSYCHIATRIC: No overt anxiety or sadness. Sleeps well. No behavior issue.   Filed  Vitals:   03/04/13 1113  BP: 165/83  Pulse: 102  Temp: 98.1 F (36.7 C)  Resp: 20    Physical Exam  GENERAL APPEARANCE: Alert, conversant. Appropriately groomed. No acute distress.  SKIN: No diaphoresis rash, or wounds HEAD: Normocephalic, atraumatic  EYES: Conjunctiva/lids clear. Pupils round, reactive. EOMs intact.  EARS: External exam WNL, canals clear. Hearing grossly normal.  NOSE: No deformity or discharge.  MOUTH/THROAT: Lips w/o lesions RESPIRATORY: Breathing is even, unlabored. Lung sounds are clear   CARDIOVASCULAR: Heart RRR no murmurs, rubs or gallops. Mod non pitting peripheral edema, pt relates chronic GASTROINTESTINAL: Abdomen is soft, non-tender, not distended w/ normal bowel sounds GENITOURINARY: Bladder non tender, not distended  MUSCULOSKELETAL: brace R knee;no redness, swelling or heat NEUROLOGIC: Oriented X3. Cranial nerves 2-12 grossly intact. Moves all extremities no tremor. PSYCHIATRIC: Mood and affect appropriate to situation, no behavioral issues  Patient Active Problem List   Diagnosis Date Noted  . S/P total knee arthroplasty 02/22/2013  . Arthritis of knee, right 11/26/2012  . Routine general medical examination at a health care facility 10/17/2011  . Sinusitis 09/19/2011  . HTN (hypertension) 07/03/2011  . Hyperlipidemia 07/03/2011  . Venous insufficiency, peripheral 07/03/2011    CBC    Component Value Date/Time   WBC 18.9* 02/25/2013 0406   RBC 3.66* 02/25/2013 0406   HGB 11.0* 02/25/2013 0406   HCT 32.7* 02/25/2013 0406   PLT 242 02/25/2013 0406   MCV 89.3 02/25/2013 0406   LYMPHSABS 2.1 02/12/2013 0950   MONOABS 0.7 02/12/2013 0950   EOSABS 0.3 02/12/2013 0950   BASOSABS 0.0 02/12/2013 0950    CMP     Component Value Date/Time   NA 134* 02/23/2013 0420   K 4.5 02/23/2013 0420   CL 98 02/23/2013 0420   CO2 21 02/23/2013 0420   GLUCOSE 90 02/23/2013 0420   BUN 16 02/23/2013 0420   CREATININE 0.95 02/23/2013 0420   CALCIUM 8.5 02/23/2013 0420    PROT 7.5 02/12/2013 0950   ALBUMIN 3.4* 02/12/2013 0950   AST 20 02/12/2013 0950   ALT 15 02/12/2013 0950   ALKPHOS 128* 02/12/2013 0950   BILITOT 0.3 02/12/2013 0950   GFRNONAA 56* 02/23/2013 0420   GFRAA 65* 02/23/2013 0420    Assessment and Plan  S/P total knee arthroplasty No complications and pt started rehab; Lovenox for prophylaxis  HTN (hypertension) Adequate control;cntinue to monitor  Hyperlipidemia LDL 103 with HDL 55 2 weeks ago ;continue zetia  Venous insufficiency, peripheral Continue BP control and statin  Hennie Duos, MD

## 2013-02-25 NOTE — Assessment & Plan Note (Signed)
Adequate control;cntinue to monitor

## 2013-02-25 NOTE — Assessment & Plan Note (Signed)
No complications and pt started rehab; Lovenox for prophylaxis

## 2013-02-25 NOTE — Assessment & Plan Note (Signed)
Continue BP control and statin

## 2013-02-25 NOTE — Progress Notes (Signed)
Physical Therapy Treatment Patient Details Name: Kristen Manning MRN: [id number] DOB: [birth year] Today's Date: 02/25/2013 Time: 3419-3790 PT Time Calculation (min): 19 min  PT Assessment / Plan / Recommendation  History of Present Illness Patient is a 80 yo female s/p Rt TKA.   PT Comments   Pt up on [facility] upon arrival.  Donned KI and transferred to bed and then amb 2' to recliner with RW and MOD A.  Pt very fatigued with these activities.  Able to perform therex with improved heel slides but with poor quad contraction.  Cont to recommend SNF.  Follow Up Recommendations  SNF     Does the patient have the potential to tolerate intense rehabilitation     Barriers to Discharge        Equipment Recommendations  None recommended by PT    Recommendations for Other Services    Frequency 7X/week   Progress towards PT Goals Progress towards PT goals: Progressing toward goals  Plan Current plan remains appropriate    Precautions / Restrictions Precautions Precautions: Knee Required Braces or Orthoses: Knee Immobilizer - Right Knee Immobilizer - Right: On when out of bed or walking Restrictions Weight Bearing Restrictions: Yes RLE Weight Bearing: Weight bearing as tolerated   Pertinent Vitals/Pain Pt didn't rate.  Appears moderate.    Mobility  Transfers Overall transfer level: Needs assistance Equipment used: Rolling walker (2 wheeled) Transfers: Sit to/from Stand Sit to Stand: Mod assist (from bed and BSC) General transfer comment:  (Pt transferred to [facility] > bed >recliner. Cues for foot placeme) Ambulation/Gait Ambulation/Gait assistance: Mod assist Ambulation Distance (Feet): 2 Feet (bed > recliner) Assistive device: Rolling walker (2 wheeled) Gait Pattern/deviations: Step-to pattern;Antalgic Gait velocity: slow General Gait Details:  (Decreased speed and difficulty progressing L LE.)    Exercises Total Joint Exercises Ankle Circles/Pumps: AROM;Both;15 reps;Seated Quad  Sets: AAROM;Right;10 reps (poor contraction) Heel Slides: AAROM;Right;10 reps;Supine (in recliner)   PT Diagnosis:    PT Problem List:   PT Treatment Interventions:     PT Goals (current goals can now be found in the care plan section) Acute Rehab PT Goals PT Goal Formulation: With patient/family  Visit Information  Last PT Received On: 02/25/13 Assistance Needed: +2 (+1 for ther ex and simple transfer) History of Present Illness: Patient is a 80 yo female s/p Rt TKA.    Subjective Data  Subjective: Pt up on BSC upon arrival.   Cognition  Cognition Arousal/Alertness: Awake/alert Behavior During Therapy: WFL for tasks assessed/performed Overall Cognitive Status: Within Functional Limits for tasks assessed    Balance     End of Session PT - End of Session Equipment Utilized During Treatment: Right knee immobilizer Activity Tolerance: Patient limited by fatigue Patient left: in chair;with call bell/phone within reach Nurse Communication: Mobility status   GP     Kristen Manning 02/25/2013, 9:52 AM

## 2013-02-25 NOTE — Progress Notes (Signed)
Clinical Social Work Department BRIEF PSYCHOSOCIAL ASSESSMENT 02/25/2013  Patient:  Kristen Manning, Kristen Manning     Account Number:  [id number]     Admit date:  02/22/2013  Clinical Social Worker:  Freeman Caldron  Date/Time:  02/25/2013 11:52 AM  Referred by:  Physician  Date Referred:  02/25/2013 Referred for  SNF Placement   Other Referral:   Interview type:  Patient Other interview type:    PSYCHOSOCIAL DATA Living Status:  FAMILY Admitted from facility:   Level of care:   Primary support name:  Susa Griffins [phone]) Primary support relationship to patient:  SIBLING Degree of support available:   Good--pt has lived with brother for the last 62 years and has a daughter that is involved in her care.    CURRENT CONCERNS Current Concerns  Post-Acute Placement   Other Concerns:    SOCIAL WORK ASSESSMENT / PLAN CSW spoke with pt about SNF recommendation, and pt explained she does not feel comfortable going home because she does not think her brother can lift her; daughter also provides support but pt states daughter could not lift her either. Pt explained she would like to go to SNF close to [place], as [place] [address], and pt mentioned [facility] as first choice. CSW asked if pt would like CSW to send clinicals to all SNFs in [facility] then pick based on whichever is closest to her home. Pt states this is a good idea. CSW explained pt is medically ready to go today, we just need some paperwork filled out by the MD and a SNF bed. Pt understanding of this and explained she will select a bed after her options are presented to her. CSW has faxed clinicals to SNFs and called [facility]; admissions at [facility] states they will take pt. CSW faxed FL2 to physician's office at secretary's request for MD to sign when he gets back to the office this afternoon. CSW to complete discharge packet once FL2 is signed and discharge summary is complete.   Assessment/plan status:   Psychosocial Support/Ongoing Assessment of Needs Other assessment/ plan:   Information/referral to community resources:   SNF [facility])    PATIENT'S/FAMILY'S RESPONSE TO PLAN OF CARE: Good--pt friendly and understanding of SNF recommendation, agreeing with this plan. Pt states she is not ready to go home because she does not think her brother or daughter could help her around the house. CSW asked if pt has any questions/concerns about SNF and pt asked how long she will be there. CSW explained medical team thinks she will be there short-term, possibly a week, and it depends on how well she does with PT at rehab and her progress in working toward PT goals. Pt expressed understanding of this and states she has no other questions/concerns at this time. CSW to follow-up with pt this afternoon with bed offers.       Ky Barban, MSW, [facility] Clinical Social Worker [phone]

## 2013-02-25 NOTE — Progress Notes (Signed)
SPORTS MEDICINE AND JOINT REPLACEMENT  Lara Mulch, MD   Carlynn Spry, PA-C [address], [place], [place]  [address]                             [phone]   PROGRESS NOTE  Subjective:  negative for Chest Pain  negative for Shortness of Breath  negative for Nausea/Vomiting   negative for Calf Pain  negative for Bowel Movement   Tolerating Diet: yes         Patient reports pain as 4 on 0-10 scale.    Objective: Vital signs in last 24 hours:   Patient Vitals for the past 24 hrs:  BP Temp Temp src Pulse Resp SpO2 Height Weight  02/25/13 0730 - - - - 18 93 % - -  02/25/13 0419 144/65 mmHg 99 F (37.2 C) Oral 107 18 93 % - -  02/25/13 0135 - - - - - - 5\' 5"  (1.651 m) 96.616 kg (213 lb)  02/25/13 0029 - 99 F (37.2 C) - - - - - -  02/25/13 0000 - - - - 18 90 % - -  02/24/13 2146 135/54 mmHg 100 F (37.8 C) Oral 104 18 92 % - -  02/24/13 2000 - - - - 18 91 % - -  02/24/13 1600 - - - - 18 91 % - -  02/24/13 1342 134/54 mmHg 99.7 F (37.6 C) Oral 93 18 91 % - -    @flow {1959:LAST@   Intake/Output from previous day:   02/18 0701 - 02/19 0700 In: 600 [P.O.:600] Out: 150 [Urine:150]   Intake/Output this shift:   02/19 0701 - 02/19 1900 In: 240 [P.O.:240] Out: 125 [Urine:125]   Intake/Output     02/18 0701 - 02/19 0700 02/19 0701 - 02/20 0700   P.O. 600 240   Total Intake(mL/kg) 600 (6.2) 240 (2.5)   Urine (mL/kg/hr) 150 (0.1) 125 (0.3)   Total Output 150 125   Net +450 +115        Urine Occurrence 8 x       LABORATORY DATA:  Recent Labs  02/22/13 1505 02/23/13 0420 02/24/13 0550 02/25/13 0406  WBC 19.2* 18.4* 14.7* 18.9*  HGB 12.5 11.0* 10.5* 11.0*  HCT 36.2 32.6* 30.2* 32.7*  PLT 299 285 304 242    Recent Labs  02/22/13 1505 02/23/13 0420  NA  --  134*  K  --  4.5  CL  --  98  CO2  --  21  BUN  --  16  CREATININE 0.95 0.95  GLUCOSE  --  90  CALCIUM  --  8.5   Lab Results  Component Value Date   INR 1.04 02/12/2013   INR 1.04  01/11/2013   INR 1.00 09/27/2009    Examination:  General appearance: alert, cooperative and no distress Extremities: Homans sign is negative, no sign of DVT  Wound Exam: clean, dry, intact   Drainage:  None: wound tissue dry  Motor Exam: EHL and FHL Intact  Sensory Exam: Deep Peroneal normal   Assessment:    3 Days Post-Op  Procedure(s) (LRB): TOTAL KNEE ARTHROPLASTY (Right)  ADDITIONAL DIAGNOSIS:  Active Problems:   S/P total knee arthroplasty  Acute Blood Loss Anemia   Plan: Physical Therapy as ordered Weight Bearing as Tolerated (WBAT)  DVT Prophylaxis:  Lovenox  DISCHARGE PLAN: Skilled Nursing Facility/Rehab  DISCHARGE NEEDS: HHPT, CPM, Walker and 3-in-1 comode seat  Amiya Escamilla 02/25/2013, 12:05 PM

## 2013-02-25 NOTE — Progress Notes (Addendum)
Clinical Social Work Department CLINICAL SOCIAL WORK PLACEMENT NOTE 02/25/2013  Patient:  Kristen Manning, Kristen Manning  Account Number:  [id number] Admit date:  02/22/2013  Clinical Social Worker:  Ky Barban, Latanya Presser  Date/time:  02/25/2013 12:02 PM  Clinical Social Work is seeking post-discharge placement for this patient at the following level of care:   [place]   (*CSW will update this form in Epic as items are completed)   02/25/2013  Patient/family provided with [facility] Department of Clinical Social Work's list of facilities offering this level of care within the geographic area requested by the patient (or if unable, by the patient's family).  02/25/2013  Patient/family informed of their freedom to choose among providers that offer the needed level of care, that participate in Medicare, Medicaid or managed care program needed by the patient, have an available bed and are willing to accept the patient.  02/25/2013  Patient/family informed of [facility]' ownership interest in [facility], as well as of the fact that they are under no obligation to receive care at this facility.  PASARR submitted to EDS on 02/25/2013 PASARR number received from EDS on 02/25/2013  FL2 transmitted to all facilities in geographic area requested by pt/family on  02/25/2013 FL2 transmitted to all facilities within larger geographic area on   Patient informed that his/her managed care company has contracts with or will negotiate with  certain facilities, including the following:     Patient/family informed of bed offers received:  02/25/13 Patient chooses bed at [facility] Physician recommends and patient chooses bed at    Patient to be transferred to [facility] on 02/25/13   Patient to be transferred to facility by [facility]  The following physician request were entered in Epic:   Additional Comments: Pt informed of discharge to SNF today. Pt selected [facility]. PTAR form filled out online  and CSW received confirmation from PTAR that they have her on their list. RN informed of discharge and will have pt ready when PTAR arrives.   Ky Barban, MSW, [facility] Clinical Social Worker [phone]

## 2013-02-25 NOTE — Discharge Summary (Signed)
SPORTS MEDICINE & JOINT REPLACEMENT   Kristen Mulch, MD   Kristen Spry, PA-C Kristen Manning, Kristen Manning, Kristen Manning  [id number]                             [phone]  PATIENT ID: Kristen Manning        MRN:  [id number]          DOB/AGE: 78-Aug-1934 / 78 y.o.    DISCHARGE SUMMARY  ADMISSION DATE:    02/22/2013 DISCHARGE DATE:   02/25/2013   ADMISSION DIAGNOSIS: osteoarthritis right total knee    DISCHARGE DIAGNOSIS:  osteoarthritis right total knee    ADDITIONAL DIAGNOSIS: Active Problems:   S/P total knee arthroplasty  Past Medical History  Diagnosis Date  . Arthritis   . Hyperlipidemia     takes Zetia daily  . Urinary incontinence   . Cancer     skin cancer, back, legs melonama  . GERD (gastroesophageal reflux disease)     takes Omeprazole daily  . Hypertension     takes HCTZ daily  . PONV (postoperative nausea and vomiting)   . Myocardial infarction     pt states EKG always shows infarct but no change from previous ekg;never knew anything about it  . Headache(784.0)   . Bacterial infection     in lungs in Jan 2015  . Shortness of breath     with exertion  . Pneumonia     hx of-many yrs ago  . History of migraine     last one about 15 yrs ago  . Osteoarthritis   . Joint swelling   . Joint pain   . Chronic back pain   . Urinary frequency   . Urinary urgency   . Nocturia     PROCEDURE: Procedure(s): TOTAL KNEE ARTHROPLASTY on 02/22/2013  CONSULTS:     HISTORY:  See H&P in chart  HOSPITAL COURSE:  Kristen Manning is a 80 y.o. admitted on 02/22/2013 and found to have a diagnosis of osteoarthritis right total knee.  After appropriate laboratory studies were obtained  they were taken to the operating room on 02/22/2013 and underwent Procedure(s): TOTAL KNEE ARTHROPLASTY.   They were given perioperative antibiotics:  Anti-infectives   Start     Dose/Rate Route Frequency Ordered Stop   02/22/13 1500  ceFAZolin (ANCEF) IVPB 2 g/50 mL premix     2 g 100  mL/hr over 30 Minutes Intravenous Every 6 hours 02/22/13 1242 02/22/13 2155   02/22/13 0600  ceFAZolin (ANCEF) IVPB 2 g/50 mL premix     2 g 100 mL/hr over 30 Minutes Intravenous On call to O.R. 02/21/13 1450 02/22/13 0849    .  Tolerated the procedure well.  Placed with a foley intraoperatively.  Given Ofirmev at induction and for 48 hours.    POD# 1: Vital signs were stable.  Patient denied Chest pain, shortness of breath, or calf pain.  Patient was started on Lovenox 30 mg subcutaneously twice daily at 8am.  Consults to PT, OT, and care management were made.  The patient was weight bearing as tolerated.  CPM was placed on the operative leg 0-90 degrees for 6-8 hours a day.  Incentive spirometry was taught.  Dressing was changed.  Marcaine pump and hemovac were discontinued.      POD #2, Continued  PT for ambulation and exercise program.  IV saline locked.  O2 discontinued.   POD #3Continued  PT for ambulation and exercise program.  The remainder of the hospital course was dedicated to ambulation and strengthening.   The patient was discharged on 3 Days Post-Op in  Good condition.  Blood products given:none  DIAGNOSTIC STUDIES: Recent vital signs: Patient Vitals for the past 24 hrs:  BP Temp Temp src Pulse Resp SpO2 Height Weight  02/25/13 0730 - - - - 18 93 % - -  02/25/13 0419 144/65 mmHg 99 F (37.2 C) Oral 107 18 93 % - -  02/25/13 0135 - - - - - - 5\' 5"  (1.651 m) 96.616 kg (213 lb)  02/25/13 0029 - 99 F (37.2 C) - - - - - -  02/25/13 0000 - - - - 18 90 % - -  02/24/13 2146 135/54 mmHg 100 F (37.8 C) Oral 104 18 92 % - -  02/24/13 2000 - - - - 18 91 % - -  02/24/13 1600 - - - - 18 91 % - -  02/24/13 1342 134/54 mmHg 99.7 F (37.6 C) Oral 93 18 91 % - -       Recent laboratory studies:  Recent Labs  02/22/13 1505 02/23/13 0420 02/24/13 0550 02/25/13 0406  WBC 19.2* 18.4* 14.7* 18.9*  HGB 12.5 11.0* 10.5* 11.0*  HCT 36.2 32.6* 30.2* 32.7*  PLT 299 285 304 242     Recent Labs  02/22/13 1505 02/23/13 0420  NA  --  134*  K  --  4.5  CL  --  98  CO2  --  21  BUN  --  16  CREATININE 0.95 0.95  GLUCOSE  --  90  CALCIUM  --  8.5   Lab Results  Component Value Date   INR 1.04 02/12/2013   INR 1.04 01/11/2013   INR 1.00 09/27/2009     Recent Radiographic Studies :  No results found.  DISCHARGE INSTRUCTIONS: Discharge Orders   Future Appointments Provider Department Dept Phone   05/26/2013 9:00 AM Midge Minium, MD [place] at  [facility]   Future Orders Complete By Expires   Call MD / Call 911  As directed    Comments:     If you experience chest pain or shortness of breath, CALL 911 and be transported to the hospital emergency room.  If you develope a fever above 101 F, pus (white drainage) or increased drainage or redness at the wound, or calf pain, call your surgeon's office.   Change dressing  As directed    Comments:     Change dressing on friday, then change the dressing daily with sterile 4 x 4 inch gauze dressing and apply TED hose.   Constipation Prevention  As directed    Comments:     Drink plenty of fluids.  Prune juice may be helpful.  You may use a stool softener, such as Colace (over the counter) 100 mg twice a day.  Use MiraLax (over the counter) for constipation as needed.   CPM  As directed    Comments:     Continuous passive motion machine (CPM):      Use the CPM from 0 to 90 for 6-8 hours per day.      You may increase by 10 per day.  You may break it up into 2 or 3 sessions per day.      Use CPM for 2 weeks or until you are told to stop.   Diet - low sodium heart healthy  As  directed    Do not put a pillow under the knee. Place it under the heel.  As directed    Driving restrictions  As directed    Comments:     No driving for 6 weeks   Increase activity slowly as tolerated  As directed    Lifting restrictions  As directed    Comments:     No lifting for 6 weeks   TED hose  As  directed    Comments:     Use stockings (TED hose) for 3 weeks on both leg(s).  You may remove them at night for sleeping.      DISCHARGE MEDICATIONS:     Medication List         enoxaparin 40 MG/0.4ML injection  Commonly known as:  LOVENOX  Inject 0.4 mLs (40 mg total) into the skin daily.     hydrochlorothiazide 25 MG tablet  Commonly known as:  HYDRODIURIL  take 1 tablet by mouth once daily     HYDROmorphone 2 MG tablet  Commonly known as:  DILAUDID  Take 1 tablet (2 mg total) by mouth every 4 (four) hours as needed for moderate pain or severe pain.     methocarbamol 500 MG tablet  Commonly known as:  ROBAXIN  Take 1-2 tablets (500-1,000 mg total) by mouth every 6 (six) hours as needed for muscle spasms.     omeprazole 20 MG capsule  Commonly known as:  PRILOSEC  Take 1 capsule (20 mg total) by mouth daily.     ZETIA 10 MG tablet  Generic drug:  ezetimibe  take 1 tablet by mouth once daily        FOLLOW UP VISIT:       Follow-up Information   Follow up with Rudean Haskell, MD. Call on 03/09/2013.   Specialty:  Orthopedic Surgery   Contact information:   [address] [place] [address] [phone]       DISPOSITION: SNF  CONDITION:  Good   Timmi Devora 02/25/2013, 12:08 PM

## 2013-02-26 ENCOUNTER — Encounter (HOSPITAL_COMMUNITY): Payer: Self-pay | Admitting: Orthopedic Surgery

## 2013-03-04 ENCOUNTER — Encounter: Payer: Self-pay | Admitting: Internal Medicine

## 2013-03-09 ENCOUNTER — Non-Acute Institutional Stay (SKILLED_NURSING_FACILITY): Payer: Medicare Other | Admitting: Nurse Practitioner

## 2013-03-09 DIAGNOSIS — IMO0002 Reserved for concepts with insufficient information to code with codable children: Secondary | ICD-10-CM

## 2013-03-09 DIAGNOSIS — M1710 Unilateral primary osteoarthritis, unspecified knee: Secondary | ICD-10-CM

## 2013-03-09 DIAGNOSIS — E785 Hyperlipidemia, unspecified: Secondary | ICD-10-CM

## 2013-03-09 DIAGNOSIS — M1711 Unilateral primary osteoarthritis, right knee: Secondary | ICD-10-CM

## 2013-03-09 DIAGNOSIS — I10 Essential (primary) hypertension: Secondary | ICD-10-CM

## 2013-03-09 NOTE — Progress Notes (Signed)
Patient ID: Kristen Manning, female   DOB: [birth year], 80 y.o.   MRN: [id number]    Nursing Home Location:  [facility]   Place of Service: SNF (31)  PCP: Annye Asa, MD  Allergies  Allergen Reactions  . Codeine Hives  . Aspirin     Noted caused 2 holes in retna, not sure     Chief Complaint  Patient presents with  . Discharge Note    HPI:  Patient is 80 y.o. female who was admitted to [facility] after total R knee arthroplasty and is admitted for OT/PT. Pt is ready to be discharged home today; went to ortho appt and MD felt like this would be okay; pt with 24 hour supervision from brother and will need HH therapies   Review of Systems:  Review of Systems  Constitutional: Negative for fever, chills and malaise/fatigue.  Respiratory: Negative for cough and shortness of breath.   Cardiovascular: Negative for chest pain and palpitations.  Gastrointestinal: Negative for diarrhea and constipation.  Genitourinary: Negative for dysuria.  Musculoskeletal: Positive for joint pain (knee but controlled with medication). Negative for myalgias.  Skin: Negative for itching and rash.  Neurological: Negative for tingling, sensory change, weakness and headaches.     Past Medical History  Diagnosis Date  . Arthritis   . Hyperlipidemia     takes Zetia daily  . Urinary incontinence   . Cancer     skin cancer, back, legs melonama  . GERD (gastroesophageal reflux disease)     takes Omeprazole daily  . Hypertension     takes HCTZ daily  . PONV (postoperative nausea and vomiting)   . Myocardial infarction     pt states EKG always shows infarct but no change from previous ekg;never knew anything about it  . Headache(784.0)   . Bacterial infection     in lungs in Jan 2015  . Shortness of breath     with exertion  . Pneumonia     hx of-many yrs ago  . History of migraine     last one about 15 yrs ago  . Osteoarthritis   . Joint swelling   . Joint pain   .  Chronic back pain   . Urinary frequency   . Urinary urgency   . Nocturia    Past Surgical History  Procedure Laterality Date  . Knee surgery Bilateral 2009 and 2011  . Bladder tack  1998  . Tonsillectomy  as a child ? date  . Oophorectomy  1998    only one  . Joint replacement Left 10/02/2009  . Blepharoplasty Bilateral   . Total knee arthroplasty Right 02/22/2013    DR LUCEY  . Total knee arthroplasty Right 02/22/2013    Procedure: TOTAL KNEE ARTHROPLASTY;  Surgeon: Vickey Huger, MD;  Location: [facility];  Service: Orthopedics;  Laterality: Right;   Social History:   reports that she has never smoked. She has never used smokeless tobacco. She reports that she does not drink alcohol or use illicit drugs.  Family History  Problem Relation Age of Onset  . Kidney disease Mother   . Alcohol abuse Father   . Stroke Father   . Alcohol abuse Brother   . Hyperlipidemia Brother   . Hypertension Brother   . Early death Brother     Medications: Patient's Medications  New Prescriptions   No medications on file  Previous Medications   ENOXAPARIN (LOVENOX) 40 MG/0.4ML INJECTION    Inject 0.4 mLs (  40 mg total) into the skin daily.   HYDROCHLOROTHIAZIDE (HYDRODIURIL) 25 MG TABLET    take 1 tablet by mouth once daily   METHOCARBAMOL (ROBAXIN) 500 MG TABLET    Take 1-2 tablets (500-1,000 mg total) by mouth every 6 (six) hours as needed for muscle spasms.   OMEPRAZOLE (PRILOSEC) 20 MG CAPSULE    Take 1 capsule (20 mg total) by mouth daily.   ZETIA 10 MG TABLET    take 1 tablet by mouth once daily  Modified Medications   No medications on file  Discontinued Medications   HYDROMORPHONE (DILAUDID) 2 MG TABLET    Take 1 tablet (2 mg total) by mouth every 4 (four) hours as needed for moderate pain or severe pain.     Physical Exam: Physical Exam  Constitutional: She is oriented to person, place, and time and well-developed, well-nourished, and in no distress.  Cardiovascular: Normal rate,  regular rhythm and normal heart sounds.   Pulmonary/Chest: Effort normal and breath sounds normal.  Abdominal: Soft. Bowel sounds are normal. She exhibits no distension. There is no tenderness.  Musculoskeletal: She exhibits no edema and no tenderness.  Neurological: She is alert and oriented to person, place, and time.  Skin: Skin is warm and dry.  Right knee incision with steristrips   Psychiatric: Affect normal.    Filed Vitals:   03/09/13 1421  BP: 118/70  Pulse: 72  Temp: 98 F (36.7 C)  Resp: 20      Labs reviewed: Basic Metabolic Panel:  Recent Labs  01/11/13 0948 02/12/13 0950 02/22/13 1505 02/23/13 0420  NA 142 140  --  134*  K 4.6 4.3  --  4.5  CL 101 101  --  98  CO2 29 24  --  21  GLUCOSE 91 87  --  90  BUN 20 18  --  16  CREATININE 1.13* 1.08 0.95 0.95  CALCIUM 9.8 9.1  --  8.5   Liver Function Tests:  Recent Labs  11/26/12 1412 01/11/13 0948 02/12/13 0950  AST 23 21 20   ALT 18 16 15   ALKPHOS 97 120* 128*  BILITOT 0.6 0.3 0.3  PROT 7.4 7.9 7.5  ALBUMIN 3.8 3.8 3.4*   No results found for this basename: LIPASE, AMYLASE,  in the last 8760 hours No results found for this basename: AMMONIA,  in the last 8760 hours CBC:  Recent Labs  01/11/13 0948 01/14/13 1522 02/12/13 0950  02/23/13 0420 02/24/13 0550 02/25/13 0406  WBC 10.1 10.0 9.1  < > 18.4* 14.7* 18.9*  NEUTROABS 7.3 8.2* 5.9  --   --   --   --   HGB 14.0 13.0 13.2  < > 11.0* 10.5* 11.0*  HCT 41.0 38.1 38.6  < > 32.6* 30.2* 32.7*  MCV 90.9 88.2 89.1  < > 90.1 89.6 89.3  PLT 349 280.0 347  < > 285 304 242  < > = values in this interval not displayed. Cardiac Enzymes: No results found for this basename: CKTOTAL, CKMB, CKMBINDEX, TROPONINI,  in the last 8760 hours BNP: No components found with this basename: POCBNP,  CBG: No results found for this basename: GLUCAP,  in the last 8760 hours TSH:  Recent Labs  11/26/12 1412  TSH 3.47   A1C: No results found for this  basename: HGBA1C   Lipid Panel:  Recent Labs  05/11/12 0949 11/26/12 1412  CHOL 197 185  HDL 60.40 54.70  LDLCALC 112* 103*  TRIG 121.0 138.0  CHOLHDL 3 3      Assessment/Plan 1. HTN (hypertension) -Patient is stable; continue current regimen and follow up as outpt  2. Arthritis of knee, right -has now had both knees replaced; latest is total right knee -pt went to Dr Ronnie Derby who said she can go home and follow up with him -he wrote Rx for pain medication -pt reports she is not taking lovenox and does NOT want a Rx for this, walking around with walker  3. Hyperlipidemia -will cont zetia pt reports she does not need Rx just picked up medication from pharm  4. Venous insufficiency -stable  pt is stable for discharge-will need PT/OT per home health.  DME of 3:1 written. RX not written, pt reports she has just picked up medication and has pain Rx in hand; has appt to follow up with ortho and reports he will be her PCP.

## 2013-04-06 DIAGNOSIS — Z96649 Presence of unspecified artificial hip joint: Secondary | ICD-10-CM | POA: Insufficient documentation

## 2013-04-25 ENCOUNTER — Other Ambulatory Visit: Payer: Self-pay | Admitting: Family Medicine

## 2013-04-26 NOTE — Telephone Encounter (Signed)
Med filled.  

## 2013-04-27 ENCOUNTER — Telehealth: Payer: Self-pay | Admitting: Family Medicine

## 2013-04-27 NOTE — Telephone Encounter (Signed)
Patient called stating that she would like for Dr Birdie Riddle to call her insurance and inform them that she is her PCP. Patient's insurance has someone else listed on her card. Please advise.    [employer] [phone]

## 2013-04-28 NOTE — Telephone Encounter (Signed)
Patient called back and I informed patient that she needed to call members service dept to update her card. Patient then voiced that she understood.

## 2013-04-28 NOTE — Telephone Encounter (Signed)
Called [employer].  Spoke with Shelbie Hutching and she shared that patient would need to call Members Services Dept at [phone] to have her card updated to indicate Dr. Birdie Riddle as her primary care provider.    Called patient to make her aware.  Left a message with family member to have patient call back whenever she gets back in.

## 2013-05-26 ENCOUNTER — Other Ambulatory Visit: Payer: Self-pay | Admitting: General Practice

## 2013-05-26 ENCOUNTER — Ambulatory Visit (INDEPENDENT_AMBULATORY_CARE_PROVIDER_SITE_OTHER): Payer: Medicare Other | Admitting: Family Medicine

## 2013-05-26 ENCOUNTER — Encounter: Payer: Self-pay | Admitting: General Practice

## 2013-05-26 ENCOUNTER — Encounter: Payer: Self-pay | Admitting: Family Medicine

## 2013-05-26 VITALS — BP 110/74 | HR 89 | Temp 98.2°F | Resp 16 | Wt 204.1 lb

## 2013-05-26 DIAGNOSIS — E785 Hyperlipidemia, unspecified: Secondary | ICD-10-CM

## 2013-05-26 DIAGNOSIS — I10 Essential (primary) hypertension: Secondary | ICD-10-CM

## 2013-05-26 DIAGNOSIS — G4700 Insomnia, unspecified: Secondary | ICD-10-CM | POA: Insufficient documentation

## 2013-05-26 DIAGNOSIS — I872 Venous insufficiency (chronic) (peripheral): Secondary | ICD-10-CM

## 2013-05-26 LAB — BASIC METABOLIC PANEL
BUN: 16 mg/dL (ref 6–23)
CO2: 29 mEq/L (ref 19–32)
Calcium: 9.2 mg/dL (ref 8.4–10.5)
Chloride: 102 mEq/L (ref 96–112)
Creatinine, Ser: 1.1 mg/dL (ref 0.4–1.2)
GFR: 53.2 mL/min — ABNORMAL LOW (ref >60.00)
Glucose, Bld: 80 mg/dL (ref 70–99)
Potassium: 5.2 mEq/L — ABNORMAL HIGH (ref 3.5–5.1)
Sodium: 138 mEq/L (ref 135–145)

## 2013-05-26 LAB — HEPATIC FUNCTION PANEL
ALT: 19 U/L (ref 0–35)
AST: 19 U/L (ref 0–37)
Albumin: 3.5 g/dL (ref 3.5–5.2)
Alkaline Phosphatase: 104 U/L (ref 39–117)
Bilirubin, Direct: 0 mg/dL (ref 0.0–0.3)
Total Bilirubin: 0.3 mg/dL (ref 0.2–1.2)
Total Protein: 7.1 g/dL (ref 6.0–8.3)

## 2013-05-26 LAB — LIPID PANEL
Cholesterol: 177 mg/dL (ref 0–200)
HDL: 45.3 mg/dL (ref >39.00)
LDL Cholesterol: 95 mg/dL (ref 0–99)
Total CHOL/HDL Ratio: 4
Triglycerides: 185 mg/dL — ABNORMAL HIGH (ref 0.0–149.0)
VLDL: 37 mg/dL (ref 0.0–40.0)

## 2013-05-26 MED ORDER — TRAZODONE HCL 50 MG PO TABS: 25.0000 mg | ORAL_TABLET | Freq: Every evening | ORAL | Status: DC | PRN

## 2013-05-26 MED ORDER — OMEPRAZOLE 20 MG PO CPDR: 20.0000 mg | DELAYED_RELEASE_CAPSULE | Freq: Every day | ORAL | Status: DC

## 2013-05-26 NOTE — Progress Notes (Signed)
   Subjective:    Patient ID: Kristen Manning, female    DOB: 1932-08-05, 80 y.o.   MRN: [id number]  HPI HTN- chronic problem, on HCTZ.  No CP, SOB above baseline (occurs w/ exertion), HAs, visual changes, edema  Hyperlipidemia- chronic problem, on Zetia.  Denies abd pain, N/V, myalgias.  Venous insufficiency- chronic problem for pt, has not seen vascular.  Now interested due to chronic, painful swelling.  Insomnia- pt reports she is unable to sleep due to nightmares about her rehab facility.  'i'll just stay up all night and watch TV'.  'please give me something'.   Review of Systems For ROS see HPI     Objective:   Physical Exam  Vitals reviewed. Constitutional: She is oriented to person, place, and time. She appears well-developed and well-nourished. No distress.  HENT:  Head: Normocephalic and atraumatic.  Eyes: Conjunctivae and EOM are normal. Pupils are equal, round, and reactive to light.  Neck: Normal range of motion. Neck supple. No thyromegaly present.  Cardiovascular: Normal rate, regular rhythm, normal heart sounds and intact distal pulses.   No murmur heard. Pulmonary/Chest: Effort normal and breath sounds normal. No respiratory distress.  Abdominal: Soft. She exhibits no distension. There is no tenderness.  Musculoskeletal: She exhibits edema (bilateral 1+ LE edema).  Lymphadenopathy:    She has no cervical adenopathy.  Neurological: She is alert and oriented to person, place, and time.  Skin: Skin is warm and dry.  Psychiatric: She has a normal mood and affect. Her behavior is normal.          Assessment & Plan:

## 2013-05-26 NOTE — Patient Instructions (Addendum)
Schedule your complete physical in 6 months We'll notify you of your lab results and make any changes if needed We'll call you with your vascular appt for the leg pain and swelling Start the Trazodone for sleep- start w/ 1/2 tab nightly and increase to 1 tab if needed Call with any questions or concerns Hang in there!!!

## 2013-05-26 NOTE — Assessment & Plan Note (Signed)
New.  Pt having nightmares about recent rehab/NH stay.  Start low dose trazodone.  Monitor for improvement.

## 2013-05-26 NOTE — Assessment & Plan Note (Signed)
Chronic problem, deteriorated after recent R TKR.  Legs now stay swollen and painful.  Refer to Vascular for evaluation and treatment.

## 2013-05-26 NOTE — Assessment & Plan Note (Signed)
Chronic problem.  On Zetia due to statin intolerance.  Check labs.  Adjust meds prn  

## 2013-05-26 NOTE — Progress Notes (Signed)
Pre visit review using our clinic review tool, if applicable. No additional management support is needed unless otherwise documented below in the visit note. 

## 2013-05-26 NOTE — Assessment & Plan Note (Signed)
Chronic problem, excellent control.  Asymptomatic.  Check labs.  No anticipated med changes. 

## 2013-06-02 ENCOUNTER — Other Ambulatory Visit: Payer: Self-pay | Admitting: *Deleted

## 2013-06-02 DIAGNOSIS — M79609 Pain in unspecified limb: Secondary | ICD-10-CM

## 2013-06-02 DIAGNOSIS — M7989 Other specified soft tissue disorders: Secondary | ICD-10-CM

## 2013-06-17 ENCOUNTER — Other Ambulatory Visit: Payer: Self-pay | Admitting: Family Medicine

## 2013-06-17 NOTE — Telephone Encounter (Signed)
Med filled.  

## 2013-06-23 ENCOUNTER — Encounter: Payer: Self-pay | Admitting: Family Medicine

## 2013-06-23 ENCOUNTER — Ambulatory Visit (INDEPENDENT_AMBULATORY_CARE_PROVIDER_SITE_OTHER): Payer: Medicare Other | Admitting: Family Medicine

## 2013-06-23 VITALS — BP 122/78 | HR 86 | Temp 98.0°F | Resp 17 | Wt 202.5 lb

## 2013-06-23 DIAGNOSIS — F32A Depression, unspecified: Secondary | ICD-10-CM

## 2013-06-23 DIAGNOSIS — F3289 Other specified depressive episodes: Secondary | ICD-10-CM

## 2013-06-23 DIAGNOSIS — F329 Major depressive disorder, single episode, unspecified: Secondary | ICD-10-CM

## 2013-06-23 MED ORDER — SERTRALINE HCL 25 MG PO TABS: 25.0000 mg | ORAL_TABLET | Freq: Every day | ORAL | Status: DC

## 2013-06-23 NOTE — Progress Notes (Signed)
   Subjective:    Patient ID: Kristen Manning, female    DOB: Jun 06, 1932, 80 y.o.   MRN: [id number]  HPI Pt had knee surgery 2/16- doesn't remember anything for 'a few days after that'.  When she woke up, screaming in pain, she reports 2 people came into her room at night, didn't turn the light on, and gave her something and she didn't 'wake up' until Thursday when she was transferred to [facility].  Pt reports she doesn't remember anything- 'i was sitting up, eating, doing PT and I don't remember any of it'.  Pt states she's still 'really confused about things' and wants to know what happened at the hospital.  Pt reports [facility] 'was a nightmare'.  Was unable to get pain meds.  Did not see a physician after her first day until time for d/c.  Reports PT was 'cruel'.  Pt very upset by experience. Not sleeping at night, tearful, angry.  Family is telling her to 'let it go'.  Pt reports she is unable to do so.  Admits that she's now depressed.   Review of Systems For ROS see HPI     Objective:   Physical Exam  Vitals reviewed. Constitutional: She is oriented to person, place, and time. She appears well-developed and well-nourished. No distress.  Neurological: She is alert and oriented to person, place, and time.  Psychiatric: Her behavior is normal.  Tearful, anxious          Assessment & Plan:

## 2013-06-23 NOTE — Patient Instructions (Signed)
Follow up in 4-6 weeks to recheck mood Start the Sertraline daily for mood Please call and discuss your experiences at [facility] w/ the Office of Patient Experience [phone] Try and relax- letting go of some of this frustration and anger will help your overall well-being Call with any questions or concerns Hang in there!!

## 2013-06-23 NOTE — Progress Notes (Signed)
Pre visit review using our clinic review tool, if applicable. No additional management support is needed unless otherwise documented below in the visit note. 

## 2013-06-24 NOTE — Assessment & Plan Note (Signed)
New.  Pt's sxs are currently moderate w/ notable emotional distress.  Will start low dose daily SSRI to improve sxs.  Encouraged pt to call the Office of Patient Experience so that she can have her voice heard.  Explained that based on what she was describing, her hospital experience was a combination of anesthesia and pain medication and her inability to remember is not uncommon.  As for the experiences at the nursing home, I told her I was unable to comment on their protocols and procedures but since she is obviously upset, she should speak w/ someone about this.  Will follow.

## 2013-07-06 ENCOUNTER — Encounter: Payer: Self-pay | Admitting: Vascular Surgery

## 2013-07-07 ENCOUNTER — Ambulatory Visit (HOSPITAL_COMMUNITY)
Admission: RE | Admit: 2013-07-07 | Discharge: 2013-07-07 | Disposition: A | Discharge status: Home / Self Care | Payer: Medicare Other | Source: Ambulatory Visit | Attending: Vascular Surgery | Admitting: Vascular Surgery

## 2013-07-07 ENCOUNTER — Encounter: Payer: Self-pay | Admitting: Vascular Surgery

## 2013-07-07 ENCOUNTER — Ambulatory Visit (INDEPENDENT_AMBULATORY_CARE_PROVIDER_SITE_OTHER): Payer: Medicare Other | Admitting: Vascular Surgery

## 2013-07-07 VITALS — BP 164/79 | HR 72 | Ht 65.0 in | Wt 200.1 lb

## 2013-07-07 DIAGNOSIS — M7989 Other specified soft tissue disorders: Secondary | ICD-10-CM | POA: Insufficient documentation

## 2013-07-07 DIAGNOSIS — M79609 Pain in unspecified limb: Secondary | ICD-10-CM | POA: Insufficient documentation

## 2013-07-07 DIAGNOSIS — R51 Headache: Secondary | ICD-10-CM

## 2013-07-07 DIAGNOSIS — R519 Headache, unspecified: Secondary | ICD-10-CM | POA: Insufficient documentation

## 2013-07-07 DIAGNOSIS — I872 Venous insufficiency (chronic) (peripheral): Secondary | ICD-10-CM

## 2013-07-07 NOTE — Assessment & Plan Note (Signed)
This patient has chronic venous insufficiency with reflux both in the deep system and also in the greater saphenous veins bilaterally. We have discussed the importance of intermittent leg elevation and the proper positioning for this. We have also discussed the use of compression stockings. However she does not tolerate these. I have encouraged her to do water aerobics  which is also helpful for patients with venous disease. She could potentially be a candidate for laser ablation of the saphenous veins however she likewise is not interested in this. I've encouraged to stay as active as possible to avoid prolonged sitting and standing. I be happy to see her back at any time if her symptoms progress.

## 2013-07-07 NOTE — Assessment & Plan Note (Signed)
She had her knee replaced in February of this year and states that she had problems with remembering what happened during the hospitalization. Since that time she's had a headache which has gradually gotten worse. She is concerned about this and for this reason we will try to refer her to the neurologist for evaluation of her persistent headaches.

## 2013-07-07 NOTE — Progress Notes (Signed)
Patient ID: Kristen Manning, female   DOB: 1932-12-08, 80 y.o.   MRN: [id number]  Reason for Consult: Chronic venous insufficiency   Referred by Midge Minium, MD  Subjective:     HPI:  KAYLIEGH BOYERS is a 80 y.o. female Who describes some swelling in both lower extremities for many years. She comes in for a venous evaluation. She does describe some aching pain in her legs at times but not always. She denies any previous history of DVT or phlebitis. She denies any significant varicose veins. She does elevate her legs at times. She does not like to wear compression stockings as they are difficult to get on and also are very hot.  Past Medical History  Diagnosis Date  . Arthritis   . Hyperlipidemia     takes Zetia daily  . Urinary incontinence   . Cancer     skin cancer, back, legs melonama  . GERD (gastroesophageal reflux disease)     takes Omeprazole daily  . Hypertension     takes HCTZ daily  . PONV (postoperative nausea and vomiting)   . Myocardial infarction     pt states EKG always shows infarct but no change from previous ekg;never knew anything about it  . Headache(784.0)   . Bacterial infection     in lungs in Jan 2015  . Shortness of breath     with exertion  . Pneumonia     hx of-many yrs ago  . History of migraine     last one about 15 yrs ago  . Osteoarthritis   . Joint swelling   . Joint pain   . Chronic back pain   . Urinary frequency   . Urinary urgency   . Nocturia    Family History  Problem Relation Age of Onset  . Kidney disease Mother   . Heart disease Mother   . Hypertension Mother   . Varicose Veins Mother   . Alcohol abuse Father   . Stroke Father   . Hypertension Father   . Varicose Veins Father   . Alcohol abuse Brother   . Hyperlipidemia Brother   . Hypertension Brother   . Early death Brother   . Varicose Veins Brother   . Varicose Veins Son    Past Surgical History  Procedure Laterality Date  . Knee surgery Bilateral  2009 and 2011  . Bladder tack  1998  . Tonsillectomy  as a child ? date  . Oophorectomy  1998    only one  . Joint replacement Left 10/02/2009  . Blepharoplasty Bilateral   . Total knee arthroplasty Right 02/22/2013    DR LUCEY  . Total knee arthroplasty Right 02/22/2013    Procedure: TOTAL KNEE ARTHROPLASTY;  Surgeon: Vickey Huger, MD;  Location: [facility];  Service: Orthopedics;  Laterality: Right;   Short Social History:  History  Substance Use Topics  . Smoking status: Never Smoker   . Smokeless tobacco: Never Used  . Alcohol Use: No   Allergies  Allergen Reactions  . Codeine Hives  . Aspirin     Noted caused 2 holes in retna, not sure   . Emetrol Rash   Current Outpatient Prescriptions  Medication Sig Dispense Refill  . hydrochlorothiazide (HYDRODIURIL) 25 MG tablet take 1 tablet by mouth once daily  30 tablet  3  . omeprazole (PRILOSEC) 20 MG capsule take 1 capsule by mouth once daily  30 capsule  3  . ZETIA 10 MG tablet  take 1 tablet by mouth once daily  30 tablet  1  . sertraline (ZOLOFT) 25 MG tablet Take 1 tablet (25 mg total) by mouth daily.  30 tablet  3   No current facility-administered medications for this visit.    Review of Systems  Constitutional: Negative for chills and fever.  Eyes: Negative for loss of vision.  Respiratory: Negative for cough and wheezing.  Cardiovascular: Positive for dyspnea with exertion and leg swelling. Negative for chest pain, chest tightness, claudication, orthopnea and palpitations.  GI: Negative for blood in stool and vomiting.  GU: Negative for dysuria and hematuria.  Musculoskeletal: Negative for leg pain, joint pain and myalgias.  Skin: Negative for rash and wound.  Neurological: Negative for dizziness and speech difficulty.  Hematologic: Negative for bruises/bleeds easily. Psychiatric: Negative for depressed mood.        Objective:  Objective  Filed Vitals:   07/07/13 1130  BP: 164/79  Pulse: 72  Height: 5\' 5"   (1.651 m)  Weight: 200 lb 1.6 oz (90.765 kg)  SpO2: 99%   Body mass index is 33.3 kg/(m^2).  Physical Exam  Constitutional: She is oriented to person, place, and time. She appears well-developed and well-nourished.  HENT:  Head: Normocephalic and atraumatic.  Neck: Neck supple. No JVD present. No thyromegaly present.  Cardiovascular: Normal rate, regular rhythm and normal heart sounds.  Exam reveals no friction rub.   No murmur heard. Pulses:      Dorsalis pedis pulses are 2+ on the right side, and 2+ on the left side.  Pulmonary/Chest: Breath sounds normal. She has no wheezes. She has no rales.  Abdominal: Soft. Bowel sounds are normal. There is no tenderness.  I do not palpate an aneurysm.  Musculoskeletal: Normal range of motion. She exhibits edema.  Lymphadenopathy:    She has no cervical adenopathy.  Neurological: She is alert and oriented to person, place, and time. She has normal strength. No sensory deficit.  Skin: No lesion and no rash noted.  She does have hyperpigmentation bilaterally.  Psychiatric: She has a normal mood and affect.   Data: I have independently interpreted her venous duplex scan. There is no evidence of DVT bilaterally. She does have deep vein reflux in the common femoral vein bilaterally. She also has reflux in the greater saphenous vein and saphenofemoral junction bilaterally. In addition she has reflux in the right small saphenous vein.      I have reviewed her records from the [facility] primary [facility]. She does have hypertension which is been well controlled. She has hyperlipidemia likewise which is well-controlled.  Assessment/Plan:    Venous insufficiency, peripheral This patient has chronic venous insufficiency with reflux both in the deep system and also in the greater saphenous veins bilaterally. We have discussed the importance of intermittent leg elevation and the proper positioning for this. We have also discussed the use of compression stockings.  However she does not tolerate these. I have encouraged her to do water aerobics  which is also helpful for patients with venous disease. She could potentially be a candidate for laser ablation of the saphenous veins however she likewise is not interested in this. I've encouraged to stay as active as possible to avoid prolonged sitting and standing. I be happy to see her back at any time if her symptoms progress.  Headache(784.0) She had her knee replaced in February of this year and states that she had problems with remembering what happened during the hospitalization. Since that time she's  had a headache which has gradually gotten worse. She is concerned about this and for this reason we will try to refer her to the neurologist for evaluation of her persistent headaches.     Angelia Mould MD [facility]

## 2013-07-07 NOTE — Addendum Note (Signed)
Addended by: Mena Goes on: 07/07/2013 01:30 PM   Modules accepted: Orders

## 2013-07-20 ENCOUNTER — Ambulatory Visit: Payer: Medicare Other | Admitting: Neurology

## 2013-07-23 ENCOUNTER — Ambulatory Visit: Payer: Medicare Other | Admitting: Neurology

## 2013-07-28 ENCOUNTER — Encounter: Payer: Self-pay | Admitting: Neurology

## 2013-07-28 ENCOUNTER — Ambulatory Visit (INDEPENDENT_AMBULATORY_CARE_PROVIDER_SITE_OTHER): Payer: PRIVATE HEALTH INSURANCE | Admitting: Neurology

## 2013-07-28 VITALS — BP 140/64 | HR 79 | Ht 64.17 in | Wt 204.3 lb

## 2013-07-28 DIAGNOSIS — M542 Cervicalgia: Secondary | ICD-10-CM

## 2013-07-28 DIAGNOSIS — R51 Headache: Secondary | ICD-10-CM

## 2013-07-28 LAB — CREATININE, SERUM: Creat: 1.15 mg/dL — ABNORMAL HIGH (ref 0.50–1.10)

## 2013-07-28 LAB — RHEUMATOID FACTOR: Rhuematoid fact SerPl-aCnc: 14 IU/mL (ref <=14)

## 2013-07-28 LAB — SEDIMENTATION RATE: Sed Rate: 26 mm/hr — ABNORMAL HIGH (ref 0–22)

## 2013-07-28 NOTE — Progress Notes (Signed)
NEUROLOGY CONSULTATION NOTE  Kristen Manning MRN: [id number] DOB: [birth year]  Referring provider: Dr. Deitra Mayo Primary care provider: Dr. Annye Asa  Reason for consult:  Daily headaches  Dear Dr Scot Dock:  Thank you for your kind referral of Kristen Manning for consultation of the above symptoms. Although her history is well known to you, please allow me to reiterate it for the purpose of our medical record. Records and images were personally reviewed where available.  HISTORY OF PRESENT ILLNESS: This is a pleasant 80 year old right-handed woman with a history of hypertension, hyperlipidemia, melanoma, presenting for evaluation of headaches that started after knee surgery last February 2015.  She had a difficult time during the admission, with no recollection of her hospital stay. She asked for hospital records which reported walking several steps, which she does not remember.  She recalls waking up in pain, and "waking up" when she was to be transferred to the nursing home.  She did not have a good experience at the SNF, and feels she had headaches at that time but was taking pain medications for her knee.  She started to notice the daily headaches once she got home in March.  She reports headaches are constant over the left temporal region, usually a 2 or 3/10, but increasing in severity up to a 5 or 6/10.  Headaches would radiate over the periorbital, frontal, and occipital regions, described as a throbbing pain that would cause some dizziness and nausea when more severe.  She has a more intense headache today.  She has noticed blurred vision in both eyes.  She takes Advil 2-3 times a week when pain is more severe, but it only helps calm the pain down. She denies having any headache-free days in the past 4 months.  There is some tenderness to palpation over the scalp, she denies any other triggers.  She reports that she had trouble sleeping once she got home, bothered by the  memories of other patients at the [facility].  She was prescribed Zoloft by her PCP but did not take it due to listed side effects on the package.  She reports that sleep is a little better now, she usually gets 6 hours of sleep but wakes up several times to urinate due to the HCTZ she takes at bedtime.  She rarely takes her brother's Ativan to sleep (1-2/month per patient).  She reports a history of migraines with her period when younger, but has been headache-free for 15 years until last March.  She denies any family history of migraines.  She denies any dysarthria, dysphagia, jaw claudication, focal numbness/tingling/weakness.  She has some pain in her left ear going down to her neck, and occasional pain wrapping around her trunk under the bra-line.  She has occasional chronic bladder incontinence after bladder surgery in the past.    PAST MEDICAL HISTORY: Past Medical History  Diagnosis Date  . Arthritis   . Hyperlipidemia     takes Zetia daily  . Urinary incontinence   . Cancer     skin cancer, back, legs melonama  . GERD (gastroesophageal reflux disease)     takes Omeprazole daily  . Hypertension     takes HCTZ daily  . PONV (postoperative nausea and vomiting)   . Myocardial infarction     pt states EKG always shows infarct but no change from previous ekg;never knew anything about it  . Headache(784.0)   . Bacterial infection     in lungs  in Jan 2015  . Shortness of breath     with exertion  . Pneumonia     hx of-many yrs ago  . History of migraine     last one about 15 yrs ago  . Osteoarthritis   . Joint swelling   . Joint pain   . Chronic back pain   . Urinary frequency   . Urinary urgency   . Nocturia     PAST SURGICAL HISTORY: Past Surgical History  Procedure Laterality Date  . Knee surgery Bilateral 2009 and 2011  . Bladder tack  1998  . Tonsillectomy  as a child ? date  . Oophorectomy  1998    only one  . Joint replacement Left 10/02/2009  . Blepharoplasty Bilateral    . Total knee arthroplasty Right 02/22/2013    DR LUCEY  . Total knee arthroplasty Right 02/22/2013    Procedure: TOTAL KNEE ARTHROPLASTY;  Surgeon: Vickey Huger, MD;  Location: [facility];  Service: Orthopedics;  Laterality: Right;    MEDICATIONS: Current Outpatient Prescriptions on File Prior to Visit  Medication Sig Dispense Refill  . hydrochlorothiazide (HYDRODIURIL) 25 MG tablet take 1 tablet by mouth once daily  30 tablet  3  . omeprazole (PRILOSEC) 20 MG capsule take 1 capsule by mouth once daily  30 capsule  3  . ZETIA 10 MG tablet take 1 tablet by mouth once daily  30 tablet  1   No current facility-administered medications on file prior to visit.    ALLERGIES: Allergies  Allergen Reactions  . Codeine Hives  . Aspirin     Noted caused 2 holes in retna, not sure   . Emetrol Rash    FAMILY HISTORY: Family History  Problem Relation Age of Onset  . Kidney disease Mother   . Heart disease Mother   . Hypertension Mother   . Varicose Veins Mother   . Alcohol abuse Father   . Stroke Father   . Hypertension Father   . Varicose Veins Father   . Alcohol abuse Brother   . Hyperlipidemia Brother   . Hypertension Brother   . Early death Brother   . Varicose Veins Brother   . Varicose Veins Son     SOCIAL HISTORY: History   Social History  . Marital Status: Widowed    Spouse Name: N/A    Number of Children: N/A  . Years of Education: N/A   Occupational History  . Not on file.   Social History Main Topics  . Smoking status: Never Smoker   . Smokeless tobacco: Never Used  . Alcohol Use: No  . Drug Use: No  . Sexual Activity: Not on file   Other Topics Concern  . Not on file   Social History Narrative  . No narrative on file    REVIEW OF SYSTEMS: Constitutional: No fevers, chills, or sweats, no generalized fatigue, change in appetite Eyes: as above Ear, nose and throat: No hearing loss, ear pain, nasal congestion, sore throat Cardiovascular: No chest pain,  palpitations Respiratory:  No shortness of breath at rest or with exertion, wheezes GastrointestinaI: No nausea, vomiting, diarrhea, abdominal pain, fecal incontinence Genitourinary:  No dysuria, urinary retention or frequency Musculoskeletal:  + neck pain, back pain Integumentary: No rash, pruritus, skin lesions Neurological: as above Psychiatric: + depression, insomnia, anxiety Endocrine: No palpitations, fatigue, diaphoresis, mood swings, change in appetite, change in weight, increased thirst Hematologic/Lymphatic:  No anemia, purpura, petechiae. Allergic/Immunologic: no itchy/runny eyes, nasal congestion, recent allergic  reactions, rashes  PHYSICAL EXAM: Filed Vitals:   07/28/13 0754  BP: 140/64  Pulse: 79   General: No acute distress Head:  Normocephalic/atraumatic, she has a band aid on the left cheek from recent melanoma excision. +tenderness to palpation over the bilateral temporal and occipital regions, no temporal artery ropiness noted Eyes: Fundoscopic exam shows bilateral sharp discs, no vessel changes, exudates, or hemorrhages Neck: supple, full range of motion, +tenderness to palpation right>left paraspinal regions Back: No paraspinal tenderness Heart: regular rate and rhythm Lungs: Clear to auscultation bilaterally. Vascular: No carotid bruits. Skin/Extremities: No rash, no edema Neurological Exam: Mental status: alert and oriented to person, place, and time, no dysarthria or aphasia, Fund of knowledge is appropriate.  Recent and remote memory are intact.  Attention and concentration are normal.    Able to name objects and repeat phrases. Cranial nerves: CN I: not tested CN II: pupils equal, round and reactive to light, visual fields intact, fundi unremarkable. CN III, IV, VI:  full range of motion, no nystagmus, no ptosis CN V: decreased light touch on left V2, decreased pin on left V2 CN VII: upper and lower face symmetric CN VIII: hearing intact to finger rub CN  IX, X: gag intact, uvula midline CN XI: sternocleidomastoid and trapezius muscles intact CN XII: tongue midline Bulk & Tone: normal, no fasciculations. Motor: 5/5 throughout with no pronator drift. Sensation: patchy sensory changes, intact to all modalities on both UE, but reports decreased cold on the right LE, decreased pin on the left LE, decreased vibration up to right knee, intact on left.  Intact joint position sense.  Romberg test slight sway Deep Tendon Reflexes: +2 on right UE, +1 left UE, unable to assess on knees due to pain, absent ankle jerks bilaterally, no ankle clonus Plantar responses: downgoing bilaterally Cerebellar: no incoordination on finger to nose testing Gait: cautious, wide-based, no ataxia Tremor: none  IMPRESSION: This is a pleasant 80 year old right-handed woman with a history of hypertension, hyperlipidemia, melanoma, presenting with new onset daily headaches for the past 4 months.  Headaches are localized over the left temporal region with associated blurred vision and occasional dizziness.  Her neurological exam shows patchy sensory changes, no motor deficits seen.  MRI brain with and without contrast will be ordered to assess for underlying structural abnormality.  ESR to assess for temporal arteritis. She has tenderness to palpation over the neck as well, cervicogenic and tension-type headaches are also considered.  We discussed the effects of poor sleep on headaches.  If tests are normal, she would benefit from headache prophylactic medication, we discussed switching HCTZ to a beta-blocker for headache prophylaxis after discussion with her PCP.  She expressed understanding and knows to minimize rescue medications to 2-3/week to avoid rebound headaches. She will discuss Ativan use with her PCP. We discussed her poor balance and gait, PT has recommended using a cane which she has but does not take with her.  We discussed fall safety precautions. She will follow-up in 1  month.  Thank you for allowing me to participate in the care of this patient. Please do not hesitate to call for any questions or concerns.   Ellouise Newer, M.D.  CC: Dr. Scot Dock

## 2013-07-28 NOTE — Patient Instructions (Addendum)
1. MRI brain with and without contrast July 27 @ 9:45 please plan to arrive 30 minutes prior for checkin 2. Bloodwork for creatinine, ESR, RF 3. Use your cane at all times 4. Follow-up in 1 month

## 2013-08-04 ENCOUNTER — Encounter: Payer: Self-pay | Admitting: Family Medicine

## 2013-08-06 ENCOUNTER — Ambulatory Visit (INDEPENDENT_AMBULATORY_CARE_PROVIDER_SITE_OTHER): Payer: PRIVATE HEALTH INSURANCE | Admitting: Neurology

## 2013-08-06 ENCOUNTER — Encounter: Payer: Self-pay | Admitting: Neurology

## 2013-08-06 VITALS — BP 132/78 | HR 77 | Ht 64.0 in | Wt 198.0 lb

## 2013-08-06 DIAGNOSIS — R0609 Other forms of dyspnea: Secondary | ICD-10-CM

## 2013-08-06 DIAGNOSIS — R51 Headache: Secondary | ICD-10-CM

## 2013-08-06 DIAGNOSIS — R0683 Snoring: Secondary | ICD-10-CM

## 2013-08-06 DIAGNOSIS — D329 Benign neoplasm of meninges, unspecified: Secondary | ICD-10-CM

## 2013-08-06 DIAGNOSIS — D320 Benign neoplasm of cerebral meninges: Secondary | ICD-10-CM

## 2013-08-06 DIAGNOSIS — R0989 Other specified symptoms and signs involving the circulatory and respiratory systems: Secondary | ICD-10-CM

## 2013-08-06 MED ORDER — NORTRIPTYLINE HCL 10 MG PO CAPS: ORAL_CAPSULE | ORAL | Status: DC

## 2013-08-06 NOTE — Progress Notes (Signed)
NEUROLOGY FOLLOW UP OFFICE NOTE  Kristen Manning [id number]  HISTORY OF PRESENT ILLNESS: I had the pleasure of seeing Kristen Manning in follow-up in the neurology clinic on 08/06/2013.  The patient was last seen last week for daily headaches since March 2015.  She returns today as an urgent visit due to continued headaches and sleep problems. She gets 7 hours of sleep but feels tired on awakening. She does snore and has daytime drowsiness.  She has a headache again today but states it is not as bad now. When more severe, there is associated nausea.  No focal numbness/tingling/weakness.  She is now seeing a counselor, reporting that living with her brother is stressful.  I personally reviewed her MRI brain with and without contrast with the patient, there is a moderate amount of chronic microvascular disease in the bilateral subcortical regions.  There is a small 1.8 x 1.1 x 1.4 cm meningioma in the left temporal region with minimal mass effect, no edema.  There is note of a prominent left PCA infundibulum extending off the left ICA versus small aneurysm, incompletely evaluated on this exam.  HPI:  This is a pleasant 80 yo RH woman with a history of hypertension, hyperlipidemia, melanoma, who presented with headaches that started after knee surgery last February 2015. She had a difficult time during the admission, with no recollection of her hospital stay. She asked for hospital records which reported walking several steps, which she does not remember. She recalls waking up in pain, and "waking up" when she was to be transferred to the nursing home. She did not have a good experience at the SNF, and feels she had headaches at that time but was taking pain medications for her knee. She started to notice the daily headaches once she got home in March. She reports headaches are constant over the left temporal region, usually a 2 or 3/10, but increasing in severity up to a 5 or 6/10. Headaches would radiate  over the periorbital, frontal, and occipital regions, described as a throbbing pain that would cause some dizziness and nausea when more severe. She has a more intense headache today. She has noticed blurred vision in both eyes. She takes Advil 2-3 times a week when pain is more severe, but it only helps calm the pain down. She denies having any headache-free days in the past 4 months. There is some tenderness to palpation over the scalp, she denies any other triggers. She reports that she had trouble sleeping once she got home, bothered by the memories of other patients at the [facility]. She was prescribed Zoloft by her PCP but did not take it due to listed side effects on the package. She reports that sleep is a little better now, she usually gets 6 hours of sleep but wakes up several times to urinate due to the HCTZ she takes at bedtime. She rarely takes her brother's Ativan to sleep (1-2/month per patient).  She reports a history of migraines with her period when younger, but has been headache-free for 15 years until last March. She denies any family history of migraines.  PAST MEDICAL HISTORY: Past Medical History  Diagnosis Date  . Arthritis   . Hyperlipidemia     takes Zetia daily  . Urinary incontinence   . Cancer     skin cancer, back, legs melonama  . GERD (gastroesophageal reflux disease)     takes Omeprazole daily  . Hypertension     takes HCTZ daily  .  PONV (postoperative nausea and vomiting)   . Myocardial infarction     pt states EKG always shows infarct but no change from previous ekg;never knew anything about it  . Headache(784.0)   . Bacterial infection     in lungs in Jan 2015  . Shortness of breath     with exertion  . Pneumonia     hx of-many yrs ago  . History of migraine     last one about 15 yrs ago  . Osteoarthritis   . Joint swelling   . Joint pain   . Chronic back pain   . Urinary frequency   . Urinary urgency   . Nocturia     MEDICATIONS: Current Outpatient  Prescriptions on File Prior to Visit  Medication Sig Dispense Refill  . hydrochlorothiazide (HYDRODIURIL) 25 MG tablet take 1 tablet by mouth once daily  30 tablet  3  . omeprazole (PRILOSEC) 20 MG capsule take 1 capsule by mouth once daily  30 capsule  3  . ZETIA 10 MG tablet take 1 tablet by mouth once daily  30 tablet  1  . Ibuprofen (ADVIL) 200 MG CAPS Take by mouth.       No current facility-administered medications on file prior to visit.    ALLERGIES: Allergies  Allergen Reactions  . Codeine Hives  . Aspirin     Noted caused 2 holes in retna, not sure   . Emetrol Rash    FAMILY HISTORY: Family History  Problem Relation Age of Onset  . Kidney disease Mother   . Heart disease Mother   . Hypertension Mother   . Varicose Veins Mother   . Alcohol abuse Father   . Stroke Father   . Hypertension Father   . Varicose Veins Father   . Alcohol abuse Brother   . Hyperlipidemia Brother   . Hypertension Brother   . Early death Brother   . Varicose Veins Brother   . Varicose Veins Son     SOCIAL HISTORY: History   Social History  . Marital Status: Widowed    Spouse Name: N/A    Number of Children: N/A  . Years of Education: N/A   Occupational History  . Not on file.   Social History Main Topics  . Smoking status: Never Smoker   . Smokeless tobacco: Never Used  . Alcohol Use: No  . Drug Use: No  . Sexual Activity: Not on file   Other Topics Concern  . Not on file   Social History Narrative  . No narrative on file    REVIEW OF SYSTEMS: Constitutional: No fevers, chills, or sweats, + generalized fatigue, change in appetite Eyes: No visual changes, double vision, eye pain Ear, nose and throat: No hearing loss, ear pain, nasal congestion, sore throat Cardiovascular: No chest pain, palpitations Respiratory:  No shortness of breath at rest or with exertion, wheezes GastrointestinaI: +nausea, no vomiting, diarrhea, abdominal pain, fecal  incontinence Genitourinary:  No dysuria, urinary retention or frequency Musculoskeletal:  No neck pain, back pain Integumentary: No rash, pruritus, skin lesions Neurological: as above Psychiatric: + depression, insomnia, anxiety Endocrine: No palpitations, fatigue, diaphoresis, mood swings, change in appetite, change in weight, increased thirst Hematologic/Lymphatic:  No anemia, purpura, petechiae. Allergic/Immunologic: no itchy/runny eyes, nasal congestion, recent allergic reactions, rashes  PHYSICAL EXAM: Filed Vitals:   08/06/13 0811  BP: 132/78  Pulse: 77   General: No acute distress Head:  Normocephalic/atraumatic Neck: supple, no paraspinal tenderness, full range of motion  Heart:  Regular rate and rhythm Lungs:  Clear to auscultation bilaterally Back: No paraspinal tenderness Skin/Extremities: No rash, no edema Neurological Exam: Mental status: alert and oriented to person, place, and time, no dysarthria or aphasia, Fund of knowledge is appropriate. Recent and remote memory are intact. Attention and concentration are normal. Able to name objects and repeat phrases.  Cranial nerves:  CN I: not tested  CN II: pupils equal, round and reactive to light, visual fields intact, fundi unremarkable.  CN III, IV, VI: full range of motion, no nystagmus, no ptosis  CN V: intact to light touch CN VII: upper and lower face symmetric  CN VIII: hearing intact to finger rub  CN IX, X: gag intact, uvula midline  CN XI: sternocleidomastoid and trapezius muscles intact  CN XII: tongue midline  Bulk & Tone: normal, no fasciculations.  Motor: 5/5 throughout with no pronator drift.  Sensation: intact to light touch.  Romberg test slight sway  Deep Tendon Reflexes: +2 on right UE, +1 left UE, unable to assess on knees due to pain, absent ankle jerks bilaterally, no ankle clonus  Plantar responses: downgoing bilaterally  Cerebellar: no incoordination on finger to nose testing  Gait: cautious,  wide-based, no ataxia  Tremor: none   IMPRESSION:  This is a pleasant 80 yo RH woman with a history of hypertension, hyperlipidemia, melanoma, with new onset daily headaches since March 2015.  Headaches are localized over the left temporal region with associated blurred vision and occasional dizziness. We discussed her MRI brain, although the small meningioma is located over the left temporal region, this is likely asymptomatic and we will continue to do annual surveillance with MRI brain every year.  Headaches likely cervicogenic versus tension-type headaches, she also reports symptoms of possible sleep apnea, which can also cause headaches.  She will be scheduled for a sleep study. She will start a daily headache prophylactic medication low dose nortriptyline 10mg  qhs with slow uptitration as tolerated. Side effects were discussed.  She will hold off on driving while starting the medication.  This may help with mood as well.  She will keep a headache diary and follow-up in 2 months.  Thank you for allowing me to participate in her care.  Please do not hesitate to call for any questions or concerns.  The duration of this appointment visit was 25 minutes of face-to-face time with the patient.  Greater than 50% of this time was spent in counseling, explanation of diagnosis, planning of further management, and coordination of care.   Ellouise Newer, M.D.   CC: Dr. Birdie Riddle

## 2013-08-06 NOTE — Patient Instructions (Addendum)
1. Start nortriptyline 10mg : Take 1 capsule at bedtime for 2 weeks, then increase to 2 capsules at bedtime 2. Schedule for sleep study 3. Keep a diary of headaches 4. Follow-up in 2 months

## 2013-08-09 ENCOUNTER — Telehealth: Payer: Self-pay

## 2013-08-09 DIAGNOSIS — M7989 Other specified soft tissue disorders: Secondary | ICD-10-CM

## 2013-08-09 DIAGNOSIS — M79609 Pain in unspecified limb: Secondary | ICD-10-CM

## 2013-08-09 NOTE — Telephone Encounter (Signed)
rec'd call from pt.  Reported she has had onset of red, raised, warm area right lateral lower leg since Saturday, 8/1.  Unable to give estimate of size; stated it is larger than 50 cent piece.  Reported she always has some swelling in her legs/feet, and that the swelling has not increased, since the onset of the area that is red/raised/warm.  Questioned if she is wearing compression stockings.  Stated she does not wear them, because she is not able to get them on.  Discussed with Dr. Trula Slade. Recommended to have pt. Apply warm compresses to right lower leg and elevate, at intervals.  Recommended to schedule appt. on [place] Dr. Scot Dock in office.  Appt. given for 2:30 PM on 08/11/13; notified pt. of Dr. Stephens Shire recommendations and of appt. date/time.  Verb. understanding; agrees with plan.

## 2013-08-10 ENCOUNTER — Encounter: Payer: Self-pay | Admitting: Family

## 2013-08-11 ENCOUNTER — Ambulatory Visit (INDEPENDENT_AMBULATORY_CARE_PROVIDER_SITE_OTHER): Payer: PRIVATE HEALTH INSURANCE | Admitting: Family

## 2013-08-11 ENCOUNTER — Ambulatory Visit (HOSPITAL_COMMUNITY)
Admission: RE | Admit: 2013-08-11 | Discharge: 2013-08-11 | Disposition: A | Discharge status: Home / Self Care | Payer: PRIVATE HEALTH INSURANCE | Source: Ambulatory Visit | Attending: Family | Admitting: Family

## 2013-08-11 ENCOUNTER — Encounter: Payer: Self-pay | Admitting: Family

## 2013-08-11 VITALS — BP 135/70 | HR 85 | Temp 97.7°F | Resp 16 | Ht 65.0 in | Wt 197.0 lb

## 2013-08-11 DIAGNOSIS — M79609 Pain in unspecified limb: Secondary | ICD-10-CM

## 2013-08-11 DIAGNOSIS — L03129 Acute lymphangitis of unspecified part of limb: Secondary | ICD-10-CM

## 2013-08-11 DIAGNOSIS — M7989 Other specified soft tissue disorders: Secondary | ICD-10-CM

## 2013-08-11 DIAGNOSIS — L539 Erythematous condition, unspecified: Secondary | ICD-10-CM

## 2013-08-11 DIAGNOSIS — L02419 Cutaneous abscess of limb, unspecified: Secondary | ICD-10-CM

## 2013-08-11 DIAGNOSIS — M79661 Pain in right lower leg: Secondary | ICD-10-CM | POA: Insufficient documentation

## 2013-08-11 DIAGNOSIS — L03119 Cellulitis of unspecified part of limb: Secondary | ICD-10-CM

## 2013-08-11 MED ORDER — CEPHALEXIN 500 MG PO CAPS: 500.0000 mg | ORAL_CAPSULE | Freq: Three times a day (TID) | ORAL | Status: DC

## 2013-08-11 NOTE — Progress Notes (Signed)
Established Venous Insufficiency  History of Present Illness  Kristen Manning is a 80 y.o. (27-Sep-1932) female patient that Dr. Scot Dock last saw in our office on 07/07/13 with chronic venous insufficiency. At that time this patient had chronic venous insufficiency with reflux both in the deep system and also in the greater saphenous veins bilaterally. Dr. Scot Dock had discussed with the pt the importance of intermittent leg elevation and the proper positioning for this and the use of compression stockings. However she does not tolerate these. Pt was encouraged to do water aerobics which is also helpful for patients with venous disease. She could potentially be a candidate for laser ablation of the saphenous veins however she likewise was not interested in this. Dr. Scot Dock encouraged pt to stay as active as possible to avoid prolonged sitting and standing. She has no history of tobacco use.  She presents today with chief complaint: lateral aspect of right lower extremity developed red, warm, and raised area 4 days ago.  The patient's symptoms have decreased in severity over the last couple of days.   The patient's treatment regimen currently included: application of warm compresses have helped. She denies fever or chills. She states she may have had an insect bite at this area, but is not certain, denies pruritus. She also has tenderness at upper inner aspect of her right thigh for the last 4 days. She has not been taking any antibiotics since June, 2015 when she was prescribed and antibiotic by her dentist before dental work as prophylaxis since she has has knee replacements. She states that the redness and swelling in both lower legs comes and goes for quite some time.   Past Medical History  Diagnosis Date  . Arthritis   . Hyperlipidemia     takes Zetia daily  . Urinary incontinence   . Cancer     skin cancer, back, legs melonama  . GERD (gastroesophageal reflux disease)     takes  Omeprazole daily  . Hypertension     takes HCTZ daily  . PONV (postoperative nausea and vomiting)   . Myocardial infarction     pt states EKG always shows infarct but no change from previous ekg;never knew anything about it  . Headache(784.0)   . Bacterial infection     in lungs in Jan 2015  . Shortness of breath     with exertion  . Pneumonia     hx of-many yrs ago  . History of migraine     last one about 15 yrs ago  . Osteoarthritis   . Joint swelling   . Joint pain   . Chronic back pain   . Urinary frequency   . Urinary urgency   . Nocturia     Social History History  Substance Use Topics  . Smoking status: Never Smoker   . Smokeless tobacco: Never Used  . Alcohol Use: No    Family History Family History  Problem Relation Age of Onset  . Kidney disease Mother   . Heart disease Mother   . Hypertension Mother   . Varicose Veins Mother   . Alcohol abuse Father   . Stroke Father   . Hypertension Father   . Varicose Veins Father   . Alcohol abuse Brother   . Hyperlipidemia Brother   . Hypertension Brother   . Early death Brother   . Varicose Veins Brother   . Varicose Veins Son     Surgical History Past Surgical History  Procedure Laterality Date  . Knee surgery Bilateral 2009 and 2011  . Bladder tack  1998  . Tonsillectomy  as a child ? date  . Oophorectomy  1998    only one  . Joint replacement Left 10/02/2009  . Blepharoplasty Bilateral   . Total knee arthroplasty Right 02/22/2013    DR LUCEY  . Total knee arthroplasty Right 02/22/2013    Procedure: TOTAL KNEE ARTHROPLASTY;  Surgeon: Vickey Huger, MD;  Location: [facility];  Service: Orthopedics;  Laterality: Right;    Allergies  Allergen Reactions  . Codeine Hives  . Aspirin     Noted caused 2 holes in retna, not sure   . Emetrol Rash    Current Outpatient Prescriptions  Medication Sig Dispense Refill  . hydrochlorothiazide (HYDRODIURIL) 25 MG tablet take 1 tablet by mouth once daily  30 tablet   3  . Ibuprofen (ADVIL) 200 MG CAPS Take by mouth.      . nortriptyline (PAMELOR) 10 MG capsule Take 1 cap at bedtime for 2 weeks, then increase to 2 capsules at bedtime  60 capsule  3  . omeprazole (PRILOSEC) 20 MG capsule take 1 capsule by mouth once daily  30 capsule  3  . ZETIA 10 MG tablet take 1 tablet by mouth once daily  30 tablet  1   No current facility-administered medications for this visit.    On ROS today: See HPI for pertinent positives and negatives.  Physical Examination  Filed Vitals:   08/11/13 1527  BP: 135/70  Pulse: 85  Temp: 97.7 F (36.5 C)  TempSrc: Oral  Resp: 16  Height: 5\' 5"  (1.651 m)  Weight: 197 lb (89.359 kg)  SpO2: 97%   Body mass index is 32.78 kg/(m^2).  General: A&O x 3, WD, Obese female.  Pulmonary: Sym exp, good air movt, CTAB, no rales, rhonchi, & wheezing.  Cardiac: RRR, Nl S1, S2, no detected murmur.  Vascular: Vessel Right Left  Radial Palpable Palpable  Carotid  without bruit  without bruit  Aorta Not palpable N/A  Femoral Palpable Palpable  Popliteal Not palpable Not palpable  PT Not Palpable Not Palpable  DP 2+Palpable 2+Palpable   Gastrointestinal: soft, NTND, -G/R, - HSM, - masses, - CVAT B.  Musculoskeletal: M/S 5/5 throughout, Extremities without ischemic changes. Tenderness to touch at both knees and lower legs, no open wounds, no drainage.  Both lower legs with erythema, right more so than left. 2+ pitting edema right lower leg, 1+ left lower leg. Right upper inner thigh is also tender to touch.  Neurologic: CN 2-12 grossly intact. Motor exam as above.  Non-Invasive Vascular Imaging  BLE Venous Duplex (Date: 08/11/2013):  LOWER EXTREMITY VENOUS DUPLEX EVALUATION    INDICATION: Red warm upper calf with tender lump    PREVIOUS INTERVENTION(S): None    DUPLEX EXAM: Right lower extremity venous duplex     Common Femoral Vein  Femoral Vein Popliteal Vein Posterior Tibial Vein  Peroneal Vein Great Saphenous  Vein   Right Left Right Left Right Left Right Left Right Left Right Left  Spontaneous + + +  +  +  N/V  +   Phasic + + +  +  +    +   Compressible + + +  +  +    +   Augmentation + + +  +  +    +   Competent - + +  +  +    -  Legend:  + = Yes, -  = No; P = Partial, D = Decreased, NV = Not Visualized, NA = Not Examined    Thrombosis - - -  -  -  -  -                                                         Legend:  A = Acute, C = Chronic, O = Obstructive, P = Partially Obstructive     ADDITIONAL FINDINGS: Enlarged lymph node right upper thigh    IMPRESSION: 1. No evidence of right lower extremity deep or superficial vein thrombosis. 2. Raised knot on proximal lateral calf appears to be a hematoma measuring approximate.3.0 1.67 cms.     Medical Decision Making  JATON EILERS is a 80 y.o. female who presents with: chronic venous insufficiency in both lower extremities and acute onset lymphangitis right leg that has started to spontaneously resolve. Venous Duplex today indicates no evidence of right lower extremity deep or superficial vein thrombosis.   Lymphangitis: Based on the patient's vascular studies and examination, and after Dr. Scot Dock examined pt have offered the patient: Keflex 500 mg tid for2 weeks, 1 refill, return for recheck in 2 weeks, Dr. Scot Dock. I will be out of the office the week of August 17-21.   Clemon Chambers, RN, MSN, FNP-C [facility] Office: [phone]  Clinic MD: [place]  08/11/2013, 3:12 PM

## 2013-08-11 NOTE — Patient Instructions (Addendum)

## 2013-08-16 ENCOUNTER — Other Ambulatory Visit: Payer: Self-pay | Admitting: Family Medicine

## 2013-08-17 NOTE — Telephone Encounter (Signed)
Last office visit:  06/23/13 Labs:  Lipid panel-05/26/13  Medication filled per Prescription Refill Protocol.

## 2013-08-20 ENCOUNTER — Encounter: Payer: Self-pay | Admitting: Neurology

## 2013-08-23 ENCOUNTER — Ambulatory Visit: Payer: PRIVATE HEALTH INSURANCE | Admitting: Neurology

## 2013-08-24 ENCOUNTER — Encounter: Payer: Self-pay | Admitting: Vascular Surgery

## 2013-08-25 ENCOUNTER — Encounter: Payer: Self-pay | Admitting: Vascular Surgery

## 2013-08-25 ENCOUNTER — Ambulatory Visit (INDEPENDENT_AMBULATORY_CARE_PROVIDER_SITE_OTHER): Payer: PRIVATE HEALTH INSURANCE | Admitting: Vascular Surgery

## 2013-08-25 VITALS — BP 140/65 | HR 78 | Ht 65.0 in | Wt 200.1 lb

## 2013-08-25 DIAGNOSIS — M79609 Pain in unspecified limb: Secondary | ICD-10-CM

## 2013-08-25 DIAGNOSIS — M79661 Pain in right lower leg: Secondary | ICD-10-CM

## 2013-08-25 NOTE — Progress Notes (Signed)
   [facility]  Patient name: Kristen Manning MRN: [id number] DOB: 1932/04/13 Sex: female  REASON FOR VISIT: Right leg pain. 2 weeks follow up visit.  HPI: Kristen Manning is a 80 y.o. female who was seen in our office on 08/11/2013. She has a history of chronic venous insufficiency. She was seen with pain redness and warmth along the lateral aspect of her right lower leg.  Venous duplex showed no evidence of right lower extremity DVT. There was a raised not on the proximal lateral calf which appear to be a hematoma.she was placed on Keflex and comes in for a two-week follow up visit. She states that the wound has drained twice and has improved significantly. She denies fever or chills.  SOCIAL HISTORY: History  Substance Use Topics  . Smoking status: Never Smoker   . Smokeless tobacco: Never Used  . Alcohol Use: No   Current Outpatient Prescriptions  Medication Sig Dispense Refill  . cephALEXin (KEFLEX) 500 MG capsule Take 1 capsule (500 mg total) by mouth 3 (three) times daily.  42 capsule  1  . hydrochlorothiazide (HYDRODIURIL) 25 MG tablet take 1 tablet by mouth once daily  30 tablet  3  . Ibuprofen (ADVIL) 200 MG CAPS Take by mouth.      . nortriptyline (PAMELOR) 10 MG capsule Take 1 cap at bedtime for 2 weeks, then increase to 2 capsules at bedtime  60 capsule  3  . omeprazole (PRILOSEC) 20 MG capsule take 1 capsule by mouth once daily  30 capsule  3  . ZETIA 10 MG tablet take 1 tablet by mouth once daily  30 tablet  5   No current facility-administered medications for this visit.   REVIEW OF SYSTEMS: Valu.Nieves ] denotes positive finding; [  ] denotes negative finding  CARDIOVASCULAR:  [ ]  chest pain   [ ]  chest pressure   [ ]  palpitations   [ ]  orthopnea  CONSTITUTIONAL:  [ ]  fever   [ ]  chills  PHYSICAL EXAM: Filed Vitals:   08/25/13 0850  BP: 140/65  Pulse: 78  Height: 5\' 5"  (1.651 m)  Weight: 200 lb 1.6 oz (90.765 kg)  SpO2: 98%   Body mass  index is 33.3 kg/(m^2). GENERAL: The patient is a well-nourished female, in no acute distress. The vital signs are documented above. CARDIOVASCULAR: There is a regular rate and rhythm.  PULMONARY: There is good air exchange bilaterally without wheezing or rales. The wound on the lateral aspect of her right leg has improved significantly. His swelling is markedly improved. There is good granulation tissue with no drainage or significant erythema.  MEDICAL ISSUES: HEMATOMA RIGHT LATERAL LEG: The wound has improved significantly and the swelling has improved. She will continue her Keflex for 2 more weeks. She is putting antibiotic ointment on the wound and it is healing nicely. I'll see her back as needed.   [facility] Beeper: [phone]

## 2013-09-08 ENCOUNTER — Telehealth: Payer: Self-pay

## 2013-09-08 NOTE — Telephone Encounter (Signed)
Phone call from pt.  Questioned if she needed to have Keflex refilled?  Stated she has been on Keflex for 1 month.  Reported the sore on her right leg is "slow to heal"; stated there is light yellow drainage, intermittently.  Stated the drainage is contained on the gauze, when she changes her bandage.  Reported she has been applying Nystatin Cream, to area, that she got from a relative.  Stated her last dose of Keflex will be taken 09/08/13.  Discussed antibiotic with Dr. Scot Dock.  Advised since pt. has been on Keflex for 1 month, she should not refill it at this time.  Advised to report any signs of redness, or worsening drainage, to get scheduled for an appt., for further eval.  Encouraged pt. to stop applying Nystatin cream, and to clean daily with Dial soap, to leave area exposed to air BID, while at home.  Advised she can apply very thin layer of Neosporin, or Bacitracin Oint. to the open area, and a light bandage, daily, between exposing to the air BID.  Pt. verb. Understanding.  Stated she will call office if symptoms don't improve.

## 2013-10-07 ENCOUNTER — Encounter: Payer: Self-pay | Admitting: Neurology

## 2013-10-07 ENCOUNTER — Ambulatory Visit (INDEPENDENT_AMBULATORY_CARE_PROVIDER_SITE_OTHER): Payer: PRIVATE HEALTH INSURANCE | Admitting: Neurology

## 2013-10-07 VITALS — BP 138/68 | HR 80 | Resp 18 | Ht 64.0 in | Wt 194.0 lb

## 2013-10-07 DIAGNOSIS — G44221 Chronic tension-type headache, intractable: Secondary | ICD-10-CM

## 2013-10-07 DIAGNOSIS — D329 Benign neoplasm of meninges, unspecified: Secondary | ICD-10-CM

## 2013-10-07 NOTE — Progress Notes (Signed)
NEUROLOGY FOLLOW UP OFFICE NOTE  NACOLE FLUHR [id number]  HISTORY OF PRESENT ILLNESS: I had the pleasure of seeing Kristen Manning in follow-up in the neurology clinic on 10/07/2013.  The patient was last seen 2 months ago for worsening headaches, likely cervicogenic versus tension-type headaches. She did not start nortriptyline on her last visit because of concern for side effects. She would like to hold off on any medication for now, and states that the headaches are not as often. She initially presented with constant headaches, but today reports headaches 4 days a week, sometimes lasting the whole day. She has minimized Advil intake to 3/week.  She wonders if it is due to sinus/allergies, because the warm air in the car today made her feel better. The headache is over the left temporal radiating to the frontal region. No vision changes, nausea/vomiting/photo/phonophobia, no focal numbness/tingling/weakness.  She is happy to report the right leg wound is healing better.  She states sleep is a little better, however she wakes up several times to urinate after taking HCTZ at bedtime. She endorses some stress with her children and "has to let them be."    HPI: This is a pleasant 80 yo RH woman with a history of hypertension, hyperlipidemia, melanoma, who presented with headaches that started after knee surgery last February 2015. She had a difficult time during the admission, with no recollection of her hospital stay. She asked for hospital records which reported walking several steps, which she does not remember. She recalls waking up in pain, and "waking up" when she was to be transferred to the nursing home. She did not have a good experience at the SNF, and feels she had headaches at that time but was taking pain medications for her knee. She started to notice the daily headaches once she got home in March. She reports headaches are constant over the left temporal region, usually a 2 or 3/10, but  increasing in severity up to a 5 or 6/10. Headaches would radiate over the periorbital, frontal, and occipital regions, described as a throbbing pain that would cause some dizziness and nausea when more severe. She has a more intense headache today. She has noticed blurred vision in both eyes. She takes Advil 2-3 times a week when pain is more severe, but it only helps calm the pain down. She denies having any headache-free days in the past 4 months. She reports that she had trouble sleeping once she got home, bothered by the memories of other patients at the [facility]. She was prescribed Zoloft by her PCP but did not take it due to listed side effects on the package. She reports that sleep is a little better now, she usually gets 6 hours of sleep but wakes up several times to urinate due to the HCTZ she takes at bedtime. She rarely takes her brother's Ativan to sleep (1-2/month per patient). She reports a history of migraines with her period when younger, but has been headache-free for 15 years until last March. She denies any family history of migraines.   Diagnostic Data: I personally reviewed her MRI brain with and without contrast with the patient, there is a moderate amount of chronic microvascular disease in the bilateral subcortical regions. There is a small 1.8 x 1.1 x 1.4 cm meningioma in the left temporal region with minimal mass effect, no edema. There is note of a prominent left PCA infundibulum extending off the left ICA versus small aneurysm, incompletely evaluated on this exam.  PAST MEDICAL HISTORY: Past Medical History  Diagnosis Date  . Arthritis   . Hyperlipidemia     takes Zetia daily  . Urinary incontinence   . Cancer     skin cancer, back, legs melonama  . GERD (gastroesophageal reflux disease)     takes Omeprazole daily  . Hypertension     takes HCTZ daily  . PONV (postoperative nausea and vomiting)   . Myocardial infarction     pt states EKG always shows infarct but no change  from previous ekg;never knew anything about it  . Headache(784.0)   . Bacterial infection     in lungs in Jan 2015  . Shortness of breath     with exertion  . Pneumonia     hx of-many yrs ago  . History of migraine     last one about 15 yrs ago  . Osteoarthritis   . Joint swelling   . Joint pain   . Chronic back pain   . Urinary frequency   . Urinary urgency   . Nocturia     MEDICATIONS: Current Outpatient Prescriptions on File Prior to Visit  Medication Sig Dispense Refill  . hydrochlorothiazide (HYDRODIURIL) 25 MG tablet take 1 tablet by mouth once daily  30 tablet  3  . Ibuprofen (ADVIL) 200 MG CAPS Take by mouth.      [name] omeprazole (PRILOSEC) 20 MG capsule take 1 capsule by mouth once daily  30 capsule  3  . ZETIA 10 MG tablet take 1 tablet by mouth once daily  30 tablet  5   No current facility-administered medications on file prior to visit.    ALLERGIES: Allergies  Allergen Reactions  . Codeine Hives  . Aspirin     Noted caused 2 holes in retna, not sure   . Emetrol Rash    FAMILY HISTORY: Family History  Problem Relation Age of Onset  . Kidney disease Mother   . Heart disease Mother   . Hypertension Mother   . Varicose Veins Mother   . Alcohol abuse Father   . Stroke Father     several   . Hypertension Father   . Varicose Veins Father   . Alcohol abuse Brother   . Hyperlipidemia Brother   . Hypertension Brother   . Early death Brother   . Varicose Veins Brother   . Varicose Veins Son   . Heart disease Brother   . Hypertension Brother   . Arthritis Brother     Gout    SOCIAL HISTORY: History   Social History  . Marital Status: Widowed    Spouse Name: N/A    Number of Children: N/A  . Years of Education: N/A   Occupational History  . Not on file.   Social History Main Topics  . Smoking status: Never Smoker   . Smokeless tobacco: Never Used  . Alcohol Use: No  . Drug Use: No  . Sexual Activity: Not on file   Other Topics Concern    . Not on file   Social History Narrative  . No narrative on file    REVIEW OF SYSTEMS: Constitutional: No fevers, chills, or sweats, no generalized fatigue, change in appetite Eyes: No visual changes, double vision, eye pain Ear, nose and throat: No hearing loss, ear pain, nasal congestion, sore throat Cardiovascular: No chest pain, palpitations Respiratory:  No shortness of breath at rest or with exertion, wheezes GastrointestinaI: No nausea, vomiting, diarrhea, abdominal pain, fecal incontinence Genitourinary:  No dysuria, urinary retention or frequency Musculoskeletal:  + neck pain, back pain Integumentary: No rash, pruritus, skin lesions Neurological: as above Psychiatric: + depression, insomnia, anxiety Endocrine: No palpitations, fatigue, diaphoresis, mood swings, change in appetite, change in weight, increased thirst Hematologic/Lymphatic:  No anemia, purpura, petechiae. Allergic/Immunologic: no itchy/runny eyes, nasal congestion, recent allergic reactions, rashes  PHYSICAL EXAM: Filed Vitals:   10/07/13 0859  BP: 138/68  Pulse: 80  Resp: 18   General: No acute distress  Head: Normocephalic/atraumatic  Neck: supple, no paraspinal tenderness, full range of motion  Heart: Regular rate and rhythm  Lungs: Clear to auscultation bilaterally  Back: No paraspinal tenderness  Skin/Extremities: No rash, no edema  Neurological Exam: Mental status: alert and oriented to person, place, and time, no dysarthria or aphasia, Fund of knowledge is appropriate. Recent and remote memory are intact. Attention and concentration are normal. Able to name objects and repeat phrases.  Cranial nerves:  CN I: not tested  CN II: pupils equal, round and reactive to light, visual fields intact, fundi unremarkable.  CN III, IV, VI: full range of motion, no nystagmus, no ptosis  CN V: intact to light touch  CN VII: upper and lower face symmetric  CN VIII: hearing intact to finger rub  CN IX, X:  gag intact, uvula midline  CN XI: sternocleidomastoid and trapezius muscles intact  CN XII: tongue midline  Bulk & Tone: normal, no fasciculations.  Motor: 5/5 throughout with no pronator drift.  Sensation: intact to light touch. Romberg test slight sway  Deep Tendon Reflexes: +1 throughout except for absent ankle jerks bilaterally, no ankle clonus  Plantar responses: downgoing bilaterally  Cerebellar: no incoordination on finger to nose testing  Gait: cautious, wide-based due to back pain, no ataxia  Tremor: none   IMPRESSION: This is a pleasant 80 yo RH woman with a history of hypertension, hyperlipidemia, melanoma, who presented with new onset daily headaches since March 2015. MRI brain shows small meningioma over the left temporal region, this is likely asymptomatic and we will continue to do annual surveillance with MRI brain every year. Headaches likely cervicogenic versus tension-type headaches. She did not take nortriptyline, and reports the headaches are not constant anymore, occurring 4/week. She may benefit from switching to a different BP medication that helps with headache prophylaxis as well, such as Atenolol, and will discuss this with Dr. Birdie Riddle, as she does not want to take any more medication. We discussed the importance of sleep hygiene with headaches, and to continue minimizing Advil intake to 2-3/week  to avoid rebound headaches. She will keep a headache diary and follow-up in 4-5 months.  Thank you for allowing me to participate in her care.  Please do not hesitate to call for any questions or concerns.  The duration of this appointment visit was 15 minutes of face-to-face time with the patient.  Greater than 50% of this time was spent in counseling, explanation of diagnosis, planning of further management, and coordination of care.   Ellouise Newer, M.D.   CC: Dr. Birdie Riddle

## 2013-10-07 NOTE — Patient Instructions (Signed)
1. Please ask Dr. Birdie Riddle to consider switching BP medication to Atenolol to hopefully help with headaches as well 2. Continue to minimize Aleve/Advil intake 3. Continue better sleep hygiene 4. Follow-up in 4-5 months

## 2013-10-17 ENCOUNTER — Other Ambulatory Visit: Payer: Self-pay | Admitting: Family Medicine

## 2013-10-18 NOTE — Telephone Encounter (Signed)
Med filled.  

## 2013-10-25 ENCOUNTER — Ambulatory Visit: Payer: PRIVATE HEALTH INSURANCE | Admitting: Family Medicine

## 2013-10-28 ENCOUNTER — Ambulatory Visit (INDEPENDENT_AMBULATORY_CARE_PROVIDER_SITE_OTHER): Payer: PRIVATE HEALTH INSURANCE | Admitting: Family Medicine

## 2013-10-28 ENCOUNTER — Encounter: Payer: Self-pay | Admitting: Family Medicine

## 2013-10-28 VITALS — BP 136/82 | HR 72 | Temp 97.9°F | Resp 16 | Wt 198.0 lb

## 2013-10-28 DIAGNOSIS — I10 Essential (primary) hypertension: Secondary | ICD-10-CM

## 2013-10-28 DIAGNOSIS — F32A Depression, unspecified: Secondary | ICD-10-CM

## 2013-10-28 DIAGNOSIS — F329 Major depressive disorder, single episode, unspecified: Secondary | ICD-10-CM

## 2013-10-28 MED ORDER — ATENOLOL 50 MG PO TABS: 50.0000 mg | ORAL_TABLET | Freq: Every day | ORAL | Status: DC

## 2013-10-28 NOTE — Patient Instructions (Addendum)
Follow up as scheduled for your complete physical STOP HCTZ daily START Atenolol daily- we'll recheck BP when you return for your physical and adjust meds as needed Start OTC Cetirizine or Loratadine daily for the allergy and sinus headaches I'm so glad that you're seeing a counselor! No need for meds at this time but we'll continue to follow this Call with any questions or concerns Hang in there!!!

## 2013-10-28 NOTE — Addendum Note (Signed)
Addended by: Midge Minium on: 10/28/2013 09:20 AM   Modules accepted: Orders

## 2013-10-28 NOTE — Assessment & Plan Note (Signed)
Improved since seeing a counselor.  Pt is not interested in taking meds due to possible side effects.  Pt continues to have stressful home situation.  Does feel safe and has a backup plan to live w/ daughter if needed.  Will continue to follow.

## 2013-10-28 NOTE — Progress Notes (Signed)
   Subjective:    Patient ID: Kristen Manning, female    DOB: 07/16/1932, 80 y.o.   MRN: [id number]  HPI HTN- pt reports neuro wanted her to switch BP meds and start beta blocker to prevent HAs as well as control BP.   Review of Systems     Objective:   Physical Exam        Assessment & Plan:

## 2013-10-28 NOTE — Progress Notes (Signed)
   Subjective:    Patient ID: Kristen Manning, female    DOB: [birth year], 80 y.o.   MRN: [id number]  HPI Depression/anxiety- pt reports mood has improved.  Did not take Trazodone for sleep- feared the side effects.  Never started Zoloft.  Pt is now seeing a counselor- Madilyn Fireman at [facility].  Starting to 'let go' of a lot of things she has been holding onto.  Daughter is no longer local, son lives in [place].  Lives w/ brother who is an alcoholic.  Pt reports being safe but situation is stressful.   Review of Systems For ROS see HPI     Objective:   Physical Exam  Vitals reviewed. Constitutional: She is oriented to person, place, and time. She appears well-developed and well-nourished. No distress.  HENT:  Head: Normocephalic and atraumatic.  Neurological: She is alert and oriented to person, place, and time.  Skin: Skin is warm and dry.  Psychiatric: She has a normal mood and affect. Her behavior is normal. Thought content normal.          Assessment & Plan:

## 2013-10-28 NOTE — Progress Notes (Signed)
Pre visit review using our clinic review tool, if applicable. No additional management support is needed unless otherwise documented below in the visit note. 

## 2013-11-25 ENCOUNTER — Encounter: Payer: Medicare Other | Admitting: Family Medicine

## 2013-12-06 ENCOUNTER — Encounter: Payer: Medicare Other | Admitting: Family Medicine

## 2013-12-24 ENCOUNTER — Encounter: Payer: Self-pay | Admitting: Medical

## 2013-12-24 ENCOUNTER — Ambulatory Visit (INDEPENDENT_AMBULATORY_CARE_PROVIDER_SITE_OTHER): Payer: PRIVATE HEALTH INSURANCE | Admitting: Medical

## 2013-12-24 VITALS — BP 136/99 | HR 84 | Temp 97.7°F | Ht 64.0 in | Wt 200.8 lb

## 2013-12-24 DIAGNOSIS — J0100 Acute maxillary sinusitis, unspecified: Secondary | ICD-10-CM

## 2013-12-24 MED ORDER — CEFDINIR 300 MG PO CAPS: 300.0000 mg | ORAL_CAPSULE | Freq: Two times a day (BID) | ORAL | Status: DC

## 2013-12-24 MED ORDER — FLUTICASONE PROPIONATE 50 MCG/ACT NA SUSP: 2.0000 | Freq: Every day | NASAL | Status: DC

## 2013-12-24 MED ORDER — BENZONATATE 100 MG PO CAPS: 100.0000 mg | ORAL_CAPSULE | Freq: Two times a day (BID) | ORAL | Status: DC | PRN

## 2013-12-24 NOTE — Progress Notes (Signed)
Subjective:    Patient ID: Kristen Manning, female    DOB: 1932/11/12, 80 y.o.   MRN: [id number]  HPI   Pt in today reporting cough sneezing, nasal congestion and runny nose for 3  Days. Some mild sorethroat. Hx of year round allergies  Associated symptoms( below yes or no)  Fever-no(subjjective then reported) at night sweaty Chills-no Chest congestion- Sneezing- yes Itching eyes-yes Sore throat- alot Post-nasal drainage-yes Wheezing-no Purulent  Nasal drainage-no Fatigue-mild.  Past Medical History  Diagnosis Date  . Arthritis   . Hyperlipidemia     takes Zetia daily  . Urinary incontinence   . Cancer     skin cancer, back, legs melonama  . GERD (gastroesophageal reflux disease)     takes Omeprazole daily  . Hypertension     takes HCTZ daily  . PONV (postoperative nausea and vomiting)   . Myocardial infarction     pt states EKG always shows infarct but no change from previous ekg;never knew anything about it  . Headache(784.0)   . Bacterial infection     in lungs in Jan 2015  . Shortness of breath     with exertion  . Pneumonia     hx of-many yrs ago  . History of migraine     last one about 15 yrs ago  . Osteoarthritis   . Joint swelling   . Joint pain   . Chronic back pain   . Urinary frequency   . Urinary urgency   . Nocturia     History   Social History  . Marital Status: Widowed    Spouse Name: N/A    Number of Children: N/A  . Years of Education: N/A   Occupational History  . Not on file.   Social History Main Topics  . Smoking status: Never Smoker   . Smokeless tobacco: Never Used  . Alcohol Use: No  . Drug Use: No  . Sexual Activity: Not on file   Other Topics Concern  . Not on file   Social History Narrative    Past Surgical History  Procedure Laterality Date  . Knee surgery Bilateral 2009 and 2011  . Bladder tack  1998  . Tonsillectomy  as a child ? date  . Oophorectomy  1998    only one  . Blepharoplasty Bilateral     . Total knee arthroplasty Right 02/22/2013    DR LUCEY  . Total knee arthroplasty Right 02/22/2013    Procedure: TOTAL KNEE ARTHROPLASTY;  Surgeon: Vickey Huger, MD;  Location: [facility];  Service: Orthopedics;  Laterality: Right;  . Joint replacement Left 10/02/2009  . Joint replacement Right 02-22-13    Knee    Family History  Problem Relation Age of Onset  . Kidney disease Mother   . Heart disease Mother   . Hypertension Mother   . Varicose Veins Mother   . Alcohol abuse Father   . Stroke Father     several   . Hypertension Father   . Varicose Veins Father   . Alcohol abuse Brother   . Hyperlipidemia Brother   . Hypertension Brother   . Early death Brother   . Varicose Veins Brother   . Varicose Veins Son   . Heart disease Brother   . Hypertension Brother   . Arthritis Brother     Gout    Allergies  Allergen Reactions  . Codeine Hives  . Aspirin     Noted caused 2 holes in retna,  not sure   . Emetrol Rash    Current Outpatient Prescriptions on File Prior to Visit  Medication Sig Dispense Refill  . atenolol (TENORMIN) 50 MG tablet Take 1 tablet (50 mg total) by mouth daily. 30 tablet 3  . Ibuprofen (ADVIL) 200 MG CAPS Take by mouth.    [name] omeprazole (PRILOSEC) 20 MG capsule take 1 capsule by mouth once daily 30 capsule 3  . ZETIA 10 MG tablet take 1 tablet by mouth once daily 30 tablet 5   No current facility-administered medications on file prior to visit.    BP 136/99 mmHg  Pulse 84  Temp(Src) 97.7 F (36.5 C) (Oral)  Ht 5\' 4"  (1.626 m)  Wt 200 lb 12.8 oz (91.082 kg)  BMI 34.45 kg/m2  SpO2 95%       Review of Systems  Constitutional: Positive for diaphoresis and fatigue. Negative for fever and chills.  HENT: Positive for congestion, postnasal drip, sinus pressure and sore throat.   Eyes: Positive for itching.  Respiratory: Positive for cough. Negative for chest tightness and wheezing.   Cardiovascular: Negative for chest pain and palpitations.   Musculoskeletal: Negative for back pain and neck pain.  Neurological: Negative for dizziness and headaches.  Hematological: Negative for adenopathy. Does not bruise/bleed easily.       Objective:   Physical Exam  General  Mental Status - Alert. General Appearance - Well groomed. Not in acute distress.  Skin Rashes- No Rashes.  HEENT Head- Normal. Ear Auditory Canal - Left- Normal. Right - Normal.Tympanic Membrane- Left- Normal. Right- Normal. Eye Sclera/Conjunctiva- Left- Normal. Right- Normal. Nose & Sinuses Nasal Mucosa- Left-  Boggy and Congested. Right-  Boggy and  Congested.Bilateral maxillary and frontal sinus pressure. Mouth & Throat Lips: Upper Lip- Normal: no dryness, cracking, pallor, cyanosis, or vesicular eruption. Lower Lip-Normal: no dryness, cracking, pallor, cyanosis or vesicular eruption. Buccal Mucosa- Bilateral- No Aphthous ulcers. Oropharynx- No Discharge or Erythema. +PND Tonsils: Characteristics- Bilateral- No Erythema or Congestion. Size/Enlargement- Bilateral- No enlargement. Discharge- bilateral-None.  Neck Neck- Supple. No Masses.   Chest and Lung Exam Auscultation: Breath Sounds:-Clear even and unlabored.  Cardiovascular Auscultation:Rythm- Regular, rate and rhythm. Murmurs & Other Heart Sounds:Ausculatation of the heart reveal- No Murmurs.  Lymphatic Head & Neck General Head & Neck Lymphatics: Bilateral: Description- No Localized lymphadenopathy.        Assessment & Plan:

## 2013-12-24 NOTE — Progress Notes (Signed)
Pre visit review using our clinic review tool, if applicable. No additional management support is needed unless otherwise documented below in the visit note. 

## 2013-12-24 NOTE — Assessment & Plan Note (Signed)
Your appear to have a sinus infection(preceded by allergies). I am prescribing  Cefdinir antibiotic for the infection. To help with the nasal congestion I prescribed nasal steroid flonase. For your associated cough, I prescribed cough medicine benzonatate.  I advise at end of antibiotic course add otc anithistamine.  Rest, hydrate, tylenol for fever.

## 2013-12-24 NOTE — Patient Instructions (Signed)
Your appear to have a sinus infection(preceded by allergies). I am prescribing  Cefdinir antibiotic for the infection. To help with the nasal congestion I prescribed nasal steroid flonase. For your associated cough, I prescribed cough medicine benzonatate.  I advise at end of antibiotic course add otc anithistamine.  Rest, hydrate, tylenol for fever.  Follow up in 7 days or as needed.

## 2013-12-24 NOTE — Addendum Note (Signed)
Addended by: Bunnie Domino on: 12/24/2013 09:49 AM   Modules accepted: Orders, Medications

## 2014-01-18 DIAGNOSIS — L603 Nail dystrophy: Secondary | ICD-10-CM | POA: Diagnosis not present

## 2014-01-18 DIAGNOSIS — I739 Peripheral vascular disease, unspecified: Secondary | ICD-10-CM | POA: Diagnosis not present

## 2014-01-18 DIAGNOSIS — Q6689 Other  specified congenital deformities of feet: Secondary | ICD-10-CM | POA: Diagnosis not present

## 2014-01-18 DIAGNOSIS — M2010 Hallux valgus (acquired), unspecified foot: Secondary | ICD-10-CM | POA: Diagnosis not present

## 2014-01-18 DIAGNOSIS — M792 Neuralgia and neuritis, unspecified: Secondary | ICD-10-CM | POA: Diagnosis not present

## 2014-02-07 ENCOUNTER — Ambulatory Visit: Payer: PRIVATE HEALTH INSURANCE | Admitting: Neurology

## 2014-02-09 ENCOUNTER — Encounter: Payer: Self-pay | Admitting: Neurology

## 2014-02-09 ENCOUNTER — Ambulatory Visit (INDEPENDENT_AMBULATORY_CARE_PROVIDER_SITE_OTHER): Payer: Medicare Other | Admitting: Neurology

## 2014-02-09 VITALS — BP 126/64 | HR 80 | Resp 16 | Ht 64.0 in | Wt 201.0 lb

## 2014-02-09 DIAGNOSIS — G44209 Tension-type headache, unspecified, not intractable: Secondary | ICD-10-CM | POA: Diagnosis not present

## 2014-02-09 DIAGNOSIS — D329 Benign neoplasm of meninges, unspecified: Secondary | ICD-10-CM | POA: Diagnosis not present

## 2014-02-09 NOTE — Patient Instructions (Addendum)
1. Continue to monitor headaches, if they worsen, call our office to discuss medication 2. Minimize intake of Advil/Tylenol/Aleve to 2-3 a week to avoid rebound headaches 3. Follow-up MRI brain with and without contrast in July 2016 4. Follow-up after MRI

## 2014-02-09 NOTE — Progress Notes (Signed)
NEUROLOGY FOLLOW UP OFFICE NOTE  Kristen Manning [id number]  HISTORY OF PRESENT ILLNESS: I had the pleasure of seeing Kristen Manning in follow-up in the neurology clinic on 02/09/2014.  The patient was last seen 4 months ago for worsening headaches, likely cervicogenic versus tension-type headaches. Since her last visit, she had discussed switching to a beta-blocker for BP and headache prophylaxis, however after reading the side effects, she decided not to start the medication. She reports that the headaches are better, she does not have the "blinding headaches" as frequent, occurring around 1-2 times a month. She does have a chronic daily mild sinus headache that Loratidine helps relieve. She denies any nausea, vomiting, photo/phonophobia, vision changes, focal numbness/tingling/weakness. She had 2 episodes of dizziness when standing up quickly, no falls. She denies any neck pain. She has occasional back pain and takes prn Advil. She has been working with a Social worker and feels much better.  HPI: This is a pleasant 81 yo RH woman with a history of hypertension, hyperlipidemia, melanoma, who presented with headaches that started after knee surgery last February 2015. She had a difficult time during the admission, with no recollection of her hospital stay. She asked for hospital records which reported walking several steps, which she does not remember. She recalls waking up in pain, and "waking up" when she was to be transferred to the nursing home. She did not have a good experience at the SNF, and feels she had headaches at that time but was taking pain medications for her knee. She started to notice the daily headaches once she got home in March. She reports headaches are constant over the left temporal region, usually a 2 or 3/10, but increasing in severity up to a 5 or 6/10. Headaches would radiate over the periorbital, frontal, and occipital regions, described as a throbbing pain that would cause  some dizziness and nausea when more severe. She has a more intense headache today. She has noticed blurred vision in both eyes. She takes Advil 2-3 times a week when pain is more severe, but it only helps calm the pain down. She denies having any headache-free days in the past 4 months. She reports that she had trouble sleeping once she got home, bothered by the memories of other patients at the [facility]. She was prescribed Zoloft by her PCP but did not take it due to listed side effects on the package. She reports that sleep is a little better now, she usually gets 6 hours of sleep but wakes up several times to urinate due to the HCTZ she takes at bedtime. She rarely takes her brother's Ativan to sleep (1-2/month per patient). She reports a history of migraines with her period when younger, but has been headache-free for 15 years until last March. She denies any family history of migraines.   Diagnostic Data: I personally reviewed her MRI brain with and without contrast with the patient, there is a moderate amount of chronic microvascular disease in the bilateral subcortical regions. There is a small 1.8 x 1.1 x 1.4 cm meningioma in the left temporal region with minimal mass effect, no edema. There is note of a prominent left PCA infundibulum extending off the left ICA versus small aneurysm, incompletely evaluated on this exam.   PAST MEDICAL HISTORY: Past Medical History  Diagnosis Date  . Arthritis   . Hyperlipidemia     takes Zetia daily  . Urinary incontinence   . Cancer     skin cancer, back,  legs melonama  . GERD (gastroesophageal reflux disease)     takes Omeprazole daily  . Hypertension     takes HCTZ daily  . PONV (postoperative nausea and vomiting)   . Myocardial infarction     pt states EKG always shows infarct but no change from previous ekg;never knew anything about it  . Headache(784.0)   . Bacterial infection     in lungs in Jan 2015  . Shortness of breath     with exertion  .  Pneumonia     hx of-many yrs ago  . History of migraine     last one about 15 yrs ago  . Osteoarthritis   . Joint swelling   . Joint pain   . Chronic back pain   . Urinary frequency   . Urinary urgency   . Nocturia     MEDICATIONS: Current Outpatient Prescriptions on File Prior to Visit  Medication Sig Dispense Refill  . fluticasone (FLONASE) 50 MCG/ACT nasal spray Place 2 sprays into both nostrils daily. 16 g 1  . hydrochlorothiazide (HYDRODIURIL) 25 MG tablet Take 25 mg by mouth daily.    . Ibuprofen (ADVIL) 200 MG CAPS Take by mouth as needed.     [name] omeprazole (PRILOSEC) 20 MG capsule take 1 capsule by mouth once daily 30 capsule 3  . ZETIA 10 MG tablet take 1 tablet by mouth once daily 30 tablet 5   No current facility-administered medications on file prior to visit.    ALLERGIES: Allergies  Allergen Reactions  . Codeine Hives  . Aspirin     Noted caused 2 holes in retna, not sure   . Emetrol Rash    FAMILY HISTORY: Family History  Problem Relation Age of Onset  . Kidney disease Mother   . Heart disease Mother   . Hypertension Mother   . Varicose Veins Mother   . Alcohol abuse Father   . Stroke Father     several   . Hypertension Father   . Varicose Veins Father   . Alcohol abuse Brother   . Hyperlipidemia Brother   . Hypertension Brother   . Early death Brother   . Varicose Veins Brother   . Varicose Veins Son   . Heart disease Brother   . Hypertension Brother   . Arthritis Brother     Gout    SOCIAL HISTORY: History   Social History  . Marital Status: Widowed    Spouse Name: N/A    Number of Children: N/A  . Years of Education: N/A   Occupational History  . Not on file.   Social History Main Topics  . Smoking status: Never Smoker   . Smokeless tobacco: Never Used  . Alcohol Use: No  . Drug Use: No  . Sexual Activity: Not on file   Other Topics Concern  . Not on file   Social History Narrative    REVIEW OF  SYSTEMS: Constitutional: No fevers, chills, or sweats, no generalized fatigue, change in appetite Eyes: No visual changes, double vision, eye pain Ear, nose and throat: No hearing loss, ear pain, nasal congestion, sore throat Cardiovascular: No chest pain, palpitations Respiratory:  No shortness of breath at rest or with exertion, wheezes GastrointestinaI: No nausea, vomiting, diarrhea, abdominal pain, fecal incontinence Genitourinary:  No dysuria, urinary retention or frequency Musculoskeletal:  No neck pain, + occl back pain Integumentary: No rash, pruritus, skin lesions Neurological: as above Psychiatric: No depression, insomnia, anxiety Endocrine: No palpitations, fatigue, diaphoresis,  mood swings, change in appetite, change in weight, increased thirst Hematologic/Lymphatic:  No anemia, purpura, petechiae. Allergic/Immunologic: no itchy/runny eyes, nasal congestion, recent allergic reactions, rashes  PHYSICAL EXAM: Filed Vitals:   02/09/14 0929  BP: 126/64  Pulse: 80  Resp: 16   General: No acute distress Head:  Normocephalic/atraumatic Neck: supple, no paraspinal tenderness, full range of motion Heart:  Regular rate and rhythm Lungs:  Clear to auscultation bilaterally Back: No paraspinal tenderness Skin/Extremities: No rash, no edema Neurological Exam: alert and oriented to person, place, and time. No aphasia or dysarthria. Fund of knowledge is appropriate.  Recent and remote memory are intact.  Attention and concentration are normal.    Able to name objects and repeat phrases. Cranial nerves: Pupils equal, round, reactive to light.  Fundoscopic exam unremarkable, no papilledema. Extraocular movements intact with no nystagmus. Visual fields full. Facial sensation intact. No facial asymmetry. Tongue, uvula, palate midline.  Motor: Bulk and tone normal, muscle strength 5/5 throughout with no pronator drift.  Sensation to light touch intact.  No extinction to double simultaneous  stimulation.  Deep tendon reflexes 1+ throughout, toes downgoing.  Finger to nose testing intact.  Gait slow and cautious, wide-based, no ataxia. Romberg negative.  IMPRESSION: This is a pleasant 81 yo RH woman with a history of hypertension, hyperlipidemia, melanoma, who presented with new onset daily headaches since March 2015. MRI brain shows small meningioma over the left temporal region, this is likely asymptomatic and we will continue to do annual surveillance with MRI brain every year. Headaches likely tension-type headaches, she reports these are better without starting medication. She has been very hesitant to take any medication and did not start nortriptyline or atenolol as discussed on prior visits. We have agreed to continue to clinically monitor symptoms and discussed benefits vs risks of being on medications. She would like to hold off on any medication for now but knows to call our office for any changes. She will be scheduled for an annual surveillance MRI brain with and without contrast for the meningioma in July and follow-up after the scan. She knows to minimize Advil intake to 2-3/week to avoid rebound headaches. She will keep a headache diary and follow-up in 5-6 months.  Thank you for allowing me to participate in her care.  Please do not hesitate to call for any questions or concerns.  The duration of this appointment visit was 15 minutes of face-to-face time with the patient.  Greater than 50% of this time was spent in counseling, explanation of diagnosis, planning of further management, and coordination of care.   Ellouise Newer, M.D.   CC: Dr. Birdie Riddle

## 2014-02-10 DIAGNOSIS — Z85828 Personal history of other malignant neoplasm of skin: Secondary | ICD-10-CM | POA: Diagnosis not present

## 2014-02-10 DIAGNOSIS — D225 Melanocytic nevi of trunk: Secondary | ICD-10-CM | POA: Diagnosis not present

## 2014-02-10 DIAGNOSIS — Z85820 Personal history of malignant melanoma of skin: Secondary | ICD-10-CM | POA: Diagnosis not present

## 2014-02-10 DIAGNOSIS — D2261 Melanocytic nevi of right upper limb, including shoulder: Secondary | ICD-10-CM | POA: Diagnosis not present

## 2014-02-10 DIAGNOSIS — C44319 Basal cell carcinoma of skin of other parts of face: Secondary | ICD-10-CM | POA: Diagnosis not present

## 2014-02-10 DIAGNOSIS — D2262 Melanocytic nevi of left upper limb, including shoulder: Secondary | ICD-10-CM | POA: Diagnosis not present

## 2014-02-14 ENCOUNTER — Telehealth: Payer: Self-pay | Admitting: Neurology

## 2014-02-14 DIAGNOSIS — D329 Benign neoplasm of meninges, unspecified: Secondary | ICD-10-CM

## 2014-02-14 NOTE — Telephone Encounter (Signed)
Lmom to return my call. 

## 2014-02-14 NOTE — Telephone Encounter (Signed)
Patient returned my call. She states she would rather not go to [facility] for her MRI she would like to go back to where she had her MRI last July which was [facility] on [address] patient I would cancel appt with [facility] & schedule her at [facility]. Also explained to patient that since she is going to a different facility she would need to come in to [facility] to have her labs done due to contrast with her MRI a few days before the scan since these were going to be done originally at the hospital before her MRI. Orders mailed to her for BUN & Creatinine to come in to [facility] to have done a few days before MRI scan.

## 2014-02-14 NOTE — Telephone Encounter (Signed)
Pt called wanting to speak to a nurse regarding her MRI that is scheduled on 08/05/14 at [facility]. Pt wants to have her MRI somewhere else. Please call pt [phone]

## 2014-02-14 NOTE — Telephone Encounter (Signed)
Called patient to give her appt info for MRI @ [facility]. Appt has been scheduled at [facility] for 08/05/14 @ 11:00 am. Patient is to arrive at 10:30 am. Appt information was left on patient's vm per her request. Lab orders were mailed out today.

## 2014-02-24 ENCOUNTER — Other Ambulatory Visit: Payer: Self-pay | Admitting: Family Medicine

## 2014-02-24 NOTE — Telephone Encounter (Signed)
Med filled.  

## 2014-03-07 ENCOUNTER — Ambulatory Visit (INDEPENDENT_AMBULATORY_CARE_PROVIDER_SITE_OTHER): Payer: Medicare Other | Admitting: Family Medicine

## 2014-03-07 ENCOUNTER — Encounter: Payer: Self-pay | Admitting: Family Medicine

## 2014-03-07 VITALS — BP 130/84 | HR 77 | Temp 98.1°F | Resp 17 | Wt 203.5 lb

## 2014-03-07 DIAGNOSIS — J0100 Acute maxillary sinusitis, unspecified: Secondary | ICD-10-CM

## 2014-03-07 MED ORDER — BENZONATATE 200 MG PO CAPS: 200.0000 mg | ORAL_CAPSULE | Freq: Three times a day (TID) | ORAL | Status: DC | PRN

## 2014-03-07 MED ORDER — AMOXICILLIN 875 MG PO TABS
875.0000 mg | ORAL_TABLET | Freq: Two times a day (BID) | ORAL | Status: DC
Start: 2014-03-07 — End: 2014-03-18

## 2014-03-07 NOTE — Patient Instructions (Signed)
Follow up as needed Start the Amoxicillin twice daily- take w/ food Use the Tessalon cough pills as needed Add Mucinex DM to thin your congestion and help w/ cough Drink plenty of fluids REST! Call with any questions or concerns Hang in there!!!

## 2014-03-07 NOTE — Progress Notes (Signed)
Pre visit review using our clinic review tool, if applicable. No additional management support is needed unless otherwise documented below in the visit note. 

## 2014-03-07 NOTE — Progress Notes (Signed)
   Subjective:    Patient ID: Kristen Manning, female    DOB: [birth year], 81 y.o.   MRN: [id number]  HPI URI- sxs started Friday w/ sore throat and 'really bad HA'.  Took 3 remaining Amox from last dental procedure.  Took Alka seltzer cold w/o relief.  + facial pain/pressure.  + body aches, PND.  No fevers.  L ear pain.  + cough- intermittently productive of thick sputum.  No N/V.  No tooth pain.   Review of Systems For ROS see HPI     Objective:   Physical Exam  Constitutional: She appears well-developed and well-nourished. No distress.  HENT:  Head: Normocephalic and atraumatic.  Right Ear: Tympanic membrane normal.  Left Ear: Tympanic membrane normal.  Nose: Mucosal edema and rhinorrhea present. Right sinus exhibits maxillary sinus tenderness and frontal sinus tenderness. Left sinus exhibits maxillary sinus tenderness and frontal sinus tenderness.  Mouth/Throat: Uvula is midline and mucous membranes are normal. Posterior oropharyngeal erythema present. No oropharyngeal exudate.  Eyes: Conjunctivae and EOM are normal. Pupils are equal, round, and reactive to light.  Neck: Normal range of motion. Neck supple.  Cardiovascular: Normal rate, regular rhythm and normal heart sounds.   Pulmonary/Chest: Effort normal and breath sounds normal. No respiratory distress. She has no wheezes.  Lymphadenopathy:    She has no cervical adenopathy.  Vitals reviewed.         Assessment & Plan:

## 2014-03-08 NOTE — Assessment & Plan Note (Signed)
Pt's sxs and PE consistent w/ infxn.  Start abx.  Reviewed supportive care and red flags that should prompt return.  Pt expressed understanding and is in agreement w/ plan.  

## 2014-03-18 ENCOUNTER — Encounter: Payer: Self-pay | Admitting: Family Medicine

## 2014-03-18 ENCOUNTER — Encounter: Payer: Self-pay | Admitting: *Deleted

## 2014-03-18 ENCOUNTER — Telehealth: Payer: Self-pay | Admitting: *Deleted

## 2014-03-18 ENCOUNTER — Ambulatory Visit (INDEPENDENT_AMBULATORY_CARE_PROVIDER_SITE_OTHER): Payer: Medicare Other | Admitting: Family Medicine

## 2014-03-18 VITALS — BP 132/82 | HR 82 | Temp 98.9°F | Resp 16 | Wt 207.4 lb

## 2014-03-18 DIAGNOSIS — J0100 Acute maxillary sinusitis, unspecified: Secondary | ICD-10-CM | POA: Diagnosis not present

## 2014-03-18 MED ORDER — SULFAMETHOXAZOLE-TRIMETHOPRIM 800-160 MG PO TABS: 1.0000 | ORAL_TABLET | Freq: Two times a day (BID) | ORAL | Status: DC

## 2014-03-18 NOTE — Telephone Encounter (Signed)
Pre-Visit Call completed with patient and chart updated.   Pre-Visit Info documented in Specialty Comments under [employer].    

## 2014-03-18 NOTE — Patient Instructions (Signed)
Follow up as scheduled on Monday Start the Bactrim twice daily- take w/ food Drink plenty of fluids REST! Call with any questions or concerns Hang in there!!!

## 2014-03-18 NOTE — Telephone Encounter (Signed)
Patient is complaining of unrelieved sinus pain and pressure. She reports severe nasal drainage.  She has completed a course of Amoxacillin and is taking Claritin daily and Guaifenesin syrup without relief.  She sounds very congested and states she feels very ill.  Acute appointment scheduled with Dr. Birdie Riddle for 4:00 today.

## 2014-03-18 NOTE — Progress Notes (Signed)
Pre visit review using our clinic review tool, if applicable. No additional management support is needed unless otherwise documented below in the visit note. 

## 2014-03-18 NOTE — Progress Notes (Signed)
   Subjective:    Patient ID: Kristen Manning, female    DOB: 14-Jan-1932, 81 y.o.   MRN: [id number]  HPI URI- pt was seen on 2/29 and tx'd w/ amox for sinusitis.  Pt reports no improvement w/ amox.  Continues to have facial pain/pressure, sore throat, HA, 'so much mucous'.  Cough has improved.  Ear pain has improved.  No fevers.   Review of Systems For ROS see HPI     Objective:   Physical Exam  Constitutional: She appears well-developed and well-nourished. No distress.  HENT:  Head: Normocephalic and atraumatic.  Right Ear: Tympanic membrane normal.  Left Ear: Tympanic membrane normal.  Nose: Mucosal edema and rhinorrhea present. Right sinus exhibits maxillary sinus tenderness and frontal sinus tenderness. Left sinus exhibits maxillary sinus tenderness and frontal sinus tenderness.  Mouth/Throat: Uvula is midline and mucous membranes are normal. Posterior oropharyngeal erythema present. No oropharyngeal exudate.  Eyes: Conjunctivae and EOM are normal. Pupils are equal, round, and reactive to light.  Neck: Normal range of motion. Neck supple.  Cardiovascular: Normal rate, regular rhythm and normal heart sounds.   Pulmonary/Chest: Effort normal and breath sounds normal. No respiratory distress. She has no wheezes.  Lymphadenopathy:    She has no cervical adenopathy.  Vitals reviewed.         Assessment & Plan:

## 2014-03-18 NOTE — Assessment & Plan Note (Signed)
No improvement since last visit.  Would consider this a treatment failure.  Switch to Bactrim.  Reviewed supportive care and red flags that should prompt return.  Pt expressed understanding and is in agreement w/ plan.

## 2014-03-21 ENCOUNTER — Ambulatory Visit (INDEPENDENT_AMBULATORY_CARE_PROVIDER_SITE_OTHER): Payer: Medicare Other | Admitting: Family Medicine

## 2014-03-21 ENCOUNTER — Other Ambulatory Visit: Payer: Self-pay | Admitting: General Practice

## 2014-03-21 ENCOUNTER — Encounter: Payer: Self-pay | Admitting: Family Medicine

## 2014-03-21 VITALS — BP 124/82 | HR 76 | Temp 98.0°F | Resp 16 | Ht 64.0 in | Wt 202.0 lb

## 2014-03-21 DIAGNOSIS — Z0000 Encounter for general adult medical examination without abnormal findings: Secondary | ICD-10-CM

## 2014-03-21 DIAGNOSIS — I10 Essential (primary) hypertension: Secondary | ICD-10-CM | POA: Diagnosis not present

## 2014-03-21 DIAGNOSIS — E2839 Other primary ovarian failure: Secondary | ICD-10-CM | POA: Diagnosis not present

## 2014-03-21 DIAGNOSIS — E785 Hyperlipidemia, unspecified: Secondary | ICD-10-CM | POA: Diagnosis not present

## 2014-03-21 DIAGNOSIS — R7989 Other specified abnormal findings of blood chemistry: Secondary | ICD-10-CM

## 2014-03-21 DIAGNOSIS — Z1231 Encounter for screening mammogram for malignant neoplasm of breast: Secondary | ICD-10-CM

## 2014-03-21 LAB — HEPATIC FUNCTION PANEL
ALT: 15 U/L (ref 0–35)
AST: 19 U/L (ref 0–37)
Albumin: 3.9 g/dL (ref 3.5–5.2)
Alkaline Phosphatase: 116 U/L (ref 39–117)
Bilirubin, Direct: 0.1 mg/dL (ref 0.0–0.3)
Total Bilirubin: 0.4 mg/dL (ref 0.2–1.2)
Total Protein: 7.4 g/dL (ref 6.0–8.3)

## 2014-03-21 LAB — CBC WITH DIFFERENTIAL/PLATELET
Basophils Absolute: 0 10*3/uL (ref 0.0–0.1)
Basophils Relative: 0.7 % (ref 0.0–3.0)
Eosinophils Absolute: 0.4 10*3/uL (ref 0.0–0.7)
Eosinophils Relative: 5.8 % — ABNORMAL HIGH (ref 0.0–5.0)
HCT: 35.5 % — ABNORMAL LOW (ref 36.0–46.0)
Hemoglobin: 12.1 g/dL (ref 12.0–15.0)
Lymphocytes Relative: 19.8 % (ref 12.0–46.0)
Lymphs Abs: 1.5 10*3/uL (ref 0.7–4.0)
MCHC: 34.2 g/dL (ref 30.0–36.0)
MCV: 86.2 fl (ref 78.0–100.0)
Monocytes Absolute: 0.7 10*3/uL (ref 0.1–1.0)
Monocytes Relative: 9.4 % (ref 3.0–12.0)
Neutro Abs: 4.8 10*3/uL (ref 1.4–7.7)
Neutrophils Relative %: 64.3 % (ref 43.0–77.0)
Platelets: 336 10*3/uL (ref 150.0–400.0)
RBC: 4.12 Mil/uL (ref 3.87–5.11)
RDW: 13.4 % (ref 11.5–15.5)
WBC: 7.4 10*3/uL (ref 4.0–10.5)

## 2014-03-21 LAB — LIPID PANEL
Cholesterol: 159 mg/dL (ref 0–200)
HDL: 51.7 mg/dL (ref >39.00)
LDL Cholesterol: 87 mg/dL (ref 0–99)
NonHDL: 107.3
Total CHOL/HDL Ratio: 3
Triglycerides: 104 mg/dL (ref 0.0–149.0)
VLDL: 20.8 mg/dL (ref 0.0–40.0)

## 2014-03-21 LAB — BASIC METABOLIC PANEL
BUN: 24 mg/dL — ABNORMAL HIGH (ref 6–23)
CO2: 28 mEq/L (ref 19–32)
Calcium: 9.3 mg/dL (ref 8.4–10.5)
Chloride: 100 mEq/L (ref 96–112)
Creatinine, Ser: 1.42 mg/dL — ABNORMAL HIGH (ref 0.40–1.20)
GFR: 37.88 mL/min — ABNORMAL LOW (ref >60.00)
Glucose, Bld: 85 mg/dL (ref 70–99)
Potassium: 4.6 mEq/L (ref 3.5–5.1)
Sodium: 133 mEq/L — ABNORMAL LOW (ref 135–145)

## 2014-03-21 LAB — TSH: TSH: 5.96 u[IU]/mL — ABNORMAL HIGH (ref 0.35–4.50)

## 2014-03-21 MED ORDER — LEVOTHYROXINE SODIUM 50 MCG PO TABS: 50.0000 ug | ORAL_TABLET | Freq: Every day | ORAL | Status: DC

## 2014-03-21 NOTE — Patient Instructions (Signed)
Follow up in 6 months to recheck BP and cholesterol We'll notify you of your lab results and make any changes if needed We'll call you with your mammogram and bone density appts Keep up the good work on healthy diet and regular exercise We'll plan on doing the [place] next time Call with any questions or concerns Happy Spring!!!

## 2014-03-21 NOTE — Progress Notes (Signed)
Pre visit review using our clinic review tool, if applicable. No additional management support is needed unless otherwise documented below in the visit note. 

## 2014-03-21 NOTE — Progress Notes (Signed)
   Subjective:    Patient ID: NICLE CONNOLE, female    DOB: Nov 25, 1932, 81 y.o.   MRN: [id number]  HPI Here today for CPE.  Risk Factors: HTN- chronic problem, on HCTZ.  Well controlled. Hyperlipidemia- chronic problem, on Zetia.  Hx of statin intolerance Physical Activity: walking regularly Fall Risk: low Depression: denies current sxs Hearing: normal to conversational tones, decrease to whispered voice ADL's: independent Cognitive: normal linear thought process, memory and attention intact Home Safety: safe at home, lives w/ brother Height, Weight, BMI, Visual Acuity: see vitals, vision corrected to 20/20 w/ glasses Counseling: due for mammo and DEXA [facility]), pt not willing to have colonoscopy.  Due for Prevnar but will hold today b/c on abx Labs Ordered: See A&P Care Plan: See A&P    Review of Systems Patient reports no vision/ hearing changes, adenopathy,fever, weight change,  persistant/recurrent hoarseness , swallowing issues, chest pain, palpitations, edema, persistant/recurrent cough, hemoptysis, dyspnea (rest/exertional/paroxysmal nocturnal), gastrointestinal bleeding (melena, rectal bleeding), abdominal pain, significant heartburn, bowel changes, GU symptoms (dysuria, hematuria, incontinence), Gyn symptoms (abnormal  bleeding, pain),  syncope, focal weakness, memory loss, numbness & tingling, skin/hair/nail changes, abnormal bruising or bleeding, anxiety, or depression.     Objective:   Physical Exam General Appearance:    Alert, cooperative, no distress, appears stated age  Head:    Normocephalic, without obvious abnormality, atraumatic  Eyes:    PERRL, conjunctiva/corneas clear, EOM's intact, fundi    benign, both eyes  Ears:    Normal TM's and external ear canals, both ears  Nose:   Nares normal, septum midline, mucosa normal, no drainage    or sinus tenderness  Throat:   Lips, mucosa, and tongue normal; teeth and gums normal  Neck:   Supple, symmetrical,  trachea midline, no adenopathy;    Thyroid: no enlargement/tenderness/nodules  Back:     Symmetric, no curvature, ROM normal, no CVA tenderness  Lungs:     Clear to auscultation bilaterally, respirations unlabored  Chest Wall:    No tenderness or deformity   Heart:    Regular rate and rhythm, S1 and S2 normal, no murmur, rub   or gallop  Breast Exam:    Deferred to mammo  Abdomen:     Soft, non-tender, bowel sounds active all four quadrants,    no masses, no organomegaly  Genitalia:    Deferred at pt's request  Rectal:    Extremities:   Extremities normal, atraumatic, no cyanosis or edema  Pulses:   2+ and symmetric all extremities  Skin:   Skin color, texture, turgor normal, no rashes or lesions  Lymph nodes:   Cervical, supraclavicular, and axillary nodes normal  Neurologic:   CNII-XII intact, normal strength, sensation and reflexes    throughout          Assessment & Plan:

## 2014-03-23 NOTE — Assessment & Plan Note (Signed)
Chronic problem.  Well controlled.  Asymptomatic.  Check labs.  No anticipated med changes. 

## 2014-03-23 NOTE — Assessment & Plan Note (Signed)
Chronic problem.  Tolerating Zetia w/o difficulty.  Check labs.  Adjust meds prn  

## 2014-03-23 NOTE — Assessment & Plan Note (Signed)
Pt's PE WNL.  Due for DEXA and mammo- orders entered.  Pt refusing colonoscopy- documented in quality metrics.  Due for Prevnar but she is currently on abx and we will hold on this until next visit.  Check labs.  Anticipatory guidance provided.

## 2014-04-05 DIAGNOSIS — M7989 Other specified soft tissue disorders: Secondary | ICD-10-CM | POA: Diagnosis not present

## 2014-04-05 DIAGNOSIS — M792 Neuralgia and neuritis, unspecified: Secondary | ICD-10-CM | POA: Diagnosis not present

## 2014-04-05 DIAGNOSIS — M7751 Other enthesopathy of right foot: Secondary | ICD-10-CM | POA: Diagnosis not present

## 2014-04-05 DIAGNOSIS — M7752 Other enthesopathy of left foot: Secondary | ICD-10-CM | POA: Diagnosis not present

## 2014-04-05 DIAGNOSIS — L603 Nail dystrophy: Secondary | ICD-10-CM | POA: Diagnosis not present

## 2014-04-05 DIAGNOSIS — I739 Peripheral vascular disease, unspecified: Secondary | ICD-10-CM | POA: Diagnosis not present

## 2014-04-11 ENCOUNTER — Telehealth: Payer: Self-pay | Admitting: Neurology

## 2014-04-11 DIAGNOSIS — D329 Benign neoplasm of meninges, unspecified: Secondary | ICD-10-CM

## 2014-04-11 NOTE — Telephone Encounter (Signed)
Pt states that she was suppose to get some blood work orders mailed to her and she never got them. Please remail the orders to her  Pt phone number is [phone]

## 2014-04-11 NOTE — Telephone Encounter (Signed)
Returned patient's call. She needs to have BUN & creatinine drawn before her next MRI which is scheduled for July 29 th @ 10:30 am [facility]. I will mail her future lab order for tests to have drawn a few days before MRI. Will call patient again to go over all of this over the phone with her as well.

## 2014-04-11 NOTE — Telephone Encounter (Signed)
Called patient and advised her of below

## 2014-04-14 ENCOUNTER — Other Ambulatory Visit: Payer: Medicare Other

## 2014-04-14 ENCOUNTER — Ambulatory Visit: Payer: Medicare Other

## 2014-04-18 ENCOUNTER — Ambulatory Visit: Payer: Medicare Other

## 2014-04-18 ENCOUNTER — Other Ambulatory Visit: Payer: Medicare Other

## 2014-04-21 ENCOUNTER — Other Ambulatory Visit: Payer: Medicare Other

## 2014-04-27 ENCOUNTER — Ambulatory Visit
Admission: RE | Admit: 2014-04-27 | Discharge: 2014-04-27 | Disposition: A | Discharge status: Home / Self Care | Payer: Medicare Other | Source: Ambulatory Visit | Attending: Family Medicine | Admitting: Family Medicine

## 2014-04-27 ENCOUNTER — Encounter: Payer: Self-pay | Admitting: General Practice

## 2014-04-27 ENCOUNTER — Other Ambulatory Visit: Payer: Self-pay | Admitting: Family Medicine

## 2014-04-27 DIAGNOSIS — M85852 Other specified disorders of bone density and structure, left thigh: Secondary | ICD-10-CM | POA: Diagnosis not present

## 2014-04-27 DIAGNOSIS — M8588 Other specified disorders of bone density and structure, other site: Secondary | ICD-10-CM | POA: Diagnosis not present

## 2014-04-27 DIAGNOSIS — Z1231 Encounter for screening mammogram for malignant neoplasm of breast: Secondary | ICD-10-CM

## 2014-04-27 DIAGNOSIS — E2839 Other primary ovarian failure: Secondary | ICD-10-CM

## 2014-04-27 DIAGNOSIS — Z780 Asymptomatic menopausal state: Secondary | ICD-10-CM | POA: Diagnosis not present

## 2014-04-28 ENCOUNTER — Other Ambulatory Visit (INDEPENDENT_AMBULATORY_CARE_PROVIDER_SITE_OTHER): Payer: Medicare Other

## 2014-04-28 DIAGNOSIS — R748 Abnormal levels of other serum enzymes: Secondary | ICD-10-CM

## 2014-04-28 DIAGNOSIS — R7989 Other specified abnormal findings of blood chemistry: Secondary | ICD-10-CM | POA: Diagnosis not present

## 2014-04-28 DIAGNOSIS — E785 Hyperlipidemia, unspecified: Secondary | ICD-10-CM | POA: Diagnosis not present

## 2014-04-28 LAB — BASIC METABOLIC PANEL
BUN: 16 mg/dL (ref 6–23)
CO2: 28 mEq/L (ref 19–32)
Calcium: 9.4 mg/dL (ref 8.4–10.5)
Chloride: 101 mEq/L (ref 96–112)
Creatinine, Ser: 1.09 mg/dL (ref 0.40–1.20)
GFR: 51.39 mL/min — ABNORMAL LOW (ref >60.00)
Glucose, Bld: 85 mg/dL (ref 70–99)
Potassium: 4.7 mEq/L (ref 3.5–5.1)
Sodium: 135 mEq/L (ref 135–145)

## 2014-04-28 LAB — TSH: TSH: 3.03 u[IU]/mL (ref 0.35–4.50)

## 2014-04-29 ENCOUNTER — Encounter: Payer: Self-pay | Admitting: Family Medicine

## 2014-04-29 ENCOUNTER — Telehealth: Payer: Self-pay | Admitting: Family Medicine

## 2014-04-29 ENCOUNTER — Other Ambulatory Visit: Payer: Self-pay | Admitting: Family Medicine

## 2014-04-29 ENCOUNTER — Ambulatory Visit (INDEPENDENT_AMBULATORY_CARE_PROVIDER_SITE_OTHER): Payer: Medicare Other | Admitting: Family Medicine

## 2014-04-29 VITALS — BP 126/80 | HR 81 | Temp 97.9°F | Resp 16 | Wt 204.2 lb

## 2014-04-29 DIAGNOSIS — R0789 Other chest pain: Secondary | ICD-10-CM | POA: Diagnosis not present

## 2014-04-29 NOTE — Progress Notes (Signed)
   Subjective:    Patient ID: Kristen Manning, female    DOB: 1932-01-21, 81 y.o.   MRN: [id number]  HPI Breast pain- pt went to get her mammogram done this week and she was turned away due to breast pain.  Having bilateral breast soreness laterally.  Radiates into anterior breasts.  No lumps.  Pain is intermittent.  Pt has hx of this- occurs 'every couple of months'.  Pt reports large amount of caffeine daily.  No recent change in activity or exercise.  Denies heavy lifting.   Review of Systems For ROS see HPI     Objective:   Physical Exam  Constitutional: She appears well-developed and well-nourished. No distress.  HENT:  Head: Normocephalic and atraumatic.  Cardiovascular: Normal rate, regular rhythm and normal heart sounds.   Pulmonary/Chest: Effort normal and breath sounds normal. No respiratory distress. She has no wheezes. She has no rales. She exhibits tenderness (pt has multiple tender areas over ribs but no TTP over breasts bilaterally) and bony tenderness. Right breast exhibits no mass, no skin change and no tenderness. Left breast exhibits no mass, no skin change and no tenderness.    Vitals reviewed.         Assessment & Plan:

## 2014-04-29 NOTE — Telephone Encounter (Signed)
Waiting on fax from the breast center to ok mammogram.

## 2014-04-29 NOTE — Assessment & Plan Note (Signed)
New.  Pt's pain is musculoskeletal- no breast tenderness whatsoever.  Pt reports pain improves w/ Advil.  Continue as needed.  Pt ok to proceed w/ mammo.  Reviewed supportive care and red flags that should prompt return.  Pt expressed understanding and is in agreement w/ plan.

## 2014-04-29 NOTE — Telephone Encounter (Signed)
Caller name: Kaytie Relation to pt: self Call back number: [phone] Pharmacy:  Reason for call:   Patient states that the [facility] told her that our office needs to call them stating that patient only needs a digital mammogram.

## 2014-04-29 NOTE — Progress Notes (Signed)
Pre visit review using our clinic review tool, if applicable. No additional management support is needed unless otherwise documented below in the visit note. 

## 2014-04-29 NOTE — Patient Instructions (Signed)
Follow up as needed Continue the Advil as needed for the chest wall pain- this is the muscles between the ribs Alternate heat/ice for pain relief Call with any questions or concerns Hang in there!!

## 2014-05-03 ENCOUNTER — Other Ambulatory Visit: Payer: Self-pay | Admitting: Family Medicine

## 2014-05-03 DIAGNOSIS — N644 Mastodynia: Secondary | ICD-10-CM

## 2014-05-10 ENCOUNTER — Other Ambulatory Visit: Payer: Self-pay | Admitting: Family Medicine

## 2014-05-10 ENCOUNTER — Ambulatory Visit
Admission: RE | Admit: 2014-05-10 | Discharge: 2014-05-10 | Disposition: A | Discharge status: Home / Self Care | Payer: Medicare Other | Source: Ambulatory Visit | Attending: Family Medicine | Admitting: Family Medicine

## 2014-05-10 DIAGNOSIS — N644 Mastodynia: Secondary | ICD-10-CM

## 2014-05-17 ENCOUNTER — Encounter: Payer: Self-pay | Admitting: Family Medicine

## 2014-06-13 ENCOUNTER — Telehealth: Payer: Self-pay | Admitting: Medical

## 2014-06-13 ENCOUNTER — Ambulatory Visit (HOSPITAL_BASED_OUTPATIENT_CLINIC_OR_DEPARTMENT_OTHER)
Admission: RE | Admit: 2014-06-13 | Discharge: 2014-06-13 | Disposition: A | Discharge status: Home / Self Care | Payer: Medicare Other | Source: Ambulatory Visit | Attending: Medical | Admitting: Medical

## 2014-06-13 ENCOUNTER — Ambulatory Visit (INDEPENDENT_AMBULATORY_CARE_PROVIDER_SITE_OTHER): Payer: Medicare Other | Admitting: Medical

## 2014-06-13 ENCOUNTER — Encounter: Payer: Self-pay | Admitting: Medical

## 2014-06-13 VITALS — BP 124/66 | HR 86 | Temp 97.6°F | Ht 64.0 in | Wt 213.2 lb

## 2014-06-13 DIAGNOSIS — R0600 Dyspnea, unspecified: Secondary | ICD-10-CM | POA: Diagnosis not present

## 2014-06-13 DIAGNOSIS — R600 Localized edema: Secondary | ICD-10-CM

## 2014-06-13 DIAGNOSIS — M79671 Pain in right foot: Secondary | ICD-10-CM | POA: Diagnosis not present

## 2014-06-13 DIAGNOSIS — R609 Edema, unspecified: Secondary | ICD-10-CM | POA: Diagnosis not present

## 2014-06-13 DIAGNOSIS — M79605 Pain in left leg: Secondary | ICD-10-CM | POA: Insufficient documentation

## 2014-06-13 DIAGNOSIS — M79609 Pain in unspecified limb: Secondary | ICD-10-CM | POA: Insufficient documentation

## 2014-06-13 DIAGNOSIS — M79672 Pain in left foot: Secondary | ICD-10-CM | POA: Diagnosis not present

## 2014-06-13 DIAGNOSIS — M79604 Pain in right leg: Secondary | ICD-10-CM | POA: Diagnosis not present

## 2014-06-13 DIAGNOSIS — M7989 Other specified soft tissue disorders: Secondary | ICD-10-CM | POA: Diagnosis not present

## 2014-06-13 DIAGNOSIS — R0602 Shortness of breath: Secondary | ICD-10-CM | POA: Insufficient documentation

## 2014-06-13 DIAGNOSIS — M79606 Pain in leg, unspecified: Secondary | ICD-10-CM | POA: Diagnosis present

## 2014-06-13 MED ORDER — CEPHALEXIN 500 MG PO CAPS: 500.0000 mg | ORAL_CAPSULE | Freq: Two times a day (BID) | ORAL | Status: DC

## 2014-06-13 NOTE — Assessment & Plan Note (Signed)
Uric acid and cbc

## 2014-06-13 NOTE — Assessment & Plan Note (Signed)
Stat lower ext [place] today.

## 2014-06-13 NOTE — Patient Instructions (Addendum)
Pedal edema With some mild dypspnea this weekend. Will  Get cxr, bnp and cmp today.  Elevate legs and continue hctz.   Popliteal pain Stat lower ext [place] today.    Pain in both feet Uric acid and cbc    Follow up 1 wk or as as needed  If pt legs get red, warm or tender as described yesterday then can start cephalexin. Will send that to her pharmacy and make it available.

## 2014-06-13 NOTE — Progress Notes (Signed)
Pre visit review using our clinic review tool, if applicable. No additional management support is needed unless otherwise documented below in the visit note. 

## 2014-06-13 NOTE — Assessment & Plan Note (Addendum)
With some mild dypspnea this weekend. Will  Get cxr, bnp and cmp today.  Elevate legs and continue hctz.

## 2014-06-13 NOTE — Progress Notes (Signed)
Subjective:    Patient ID: Kristen Manning, female    DOB: 1932/11/23, 81 y.o.   MRN: [id number]  HPI  Pt in states that her ankles and lower legs are recently more swollen that usual. She states usually  Some mild  swelling around ankles but most recently(over the weekend)  below her knees all the way  to her ankles and feet. Pt stats on Saturday the swelling was severe and legs felt hard and she could not move her toes well. Then today swelling went down and able to move toes. She states her legs area ankles are legs mild painful.   Recently with leg pain and swelling mild sob since over the weekend. But no chest pain. Was very minimal and transient.     Review of Systems  Constitutional: Negative for fever, chills, diaphoresis, activity change and fatigue.  Respiratory: Negative for cough, chest tightness and shortness of breath.   Cardiovascular: Negative for chest pain, palpitations and leg swelling.  Gastrointestinal: Negative for nausea, vomiting and abdominal pain.  Musculoskeletal: Negative for neck pain and neck stiffness.       Knee pain bilateral. Some pain both popliteal fossa pain.   Skin: Positive for rash.  Neurological: Negative for dizziness, tremors, seizures, syncope, facial asymmetry, speech difficulty, weakness, light-headedness, numbness and headaches.  Psychiatric/Behavioral: Negative for behavioral problems, confusion and agitation. The patient is not nervous/anxious.     Past Medical History  Diagnosis Date  . Arthritis   . Hyperlipidemia     takes Zetia daily  . Urinary incontinence   . Cancer     skin cancer, back, legs melonama  . GERD (gastroesophageal reflux disease)     takes Omeprazole daily  . Hypertension     takes HCTZ daily  . PONV (postoperative nausea and vomiting)   . Myocardial infarction     pt states EKG always shows infarct but no change from previous ekg;never knew anything about it  . Headache(784.0)   . Bacterial infection     in lungs in Jan 2015  . Shortness of breath     with exertion  . Pneumonia     hx of-many yrs ago  . History of migraine     last one about 15 yrs ago  . Osteoarthritis   . Joint swelling   . Joint pain   . Chronic back pain   . Urinary frequency   . Urinary urgency   . Nocturia     History   Social History  . Marital Status: Widowed    Spouse Name: N/A  . Number of Children: N/A  . Years of Education: N/A   Occupational History  . Not on file.   Social History Main Topics  . Smoking status: Never Smoker   . Smokeless tobacco: Never Used  . Alcohol Use: No  . Drug Use: No  . Sexual Activity: Not on file   Other Topics Concern  . Not on file   Social History Narrative    Past Surgical History  Procedure Laterality Date  . Knee surgery Bilateral 2009 and 2011  . Bladder tack  1998  . Tonsillectomy  as a child ? date  . Oophorectomy  1998    only one  . Blepharoplasty Bilateral   . Total knee arthroplasty Right 02/22/2013    DR LUCEY  . Total knee arthroplasty Right 02/22/2013    Procedure: TOTAL KNEE ARTHROPLASTY;  Surgeon: Vickey Huger, MD;  Location: [facility];  Service: Orthopedics;  Laterality: Right;  . Joint replacement Left 10/02/2009  . Joint replacement Right 02-22-13    Knee    Family History  Problem Relation Age of Onset  . Kidney disease Mother   . Heart disease Mother   . Hypertension Mother   . Varicose Veins Mother   . Alcohol abuse Father   . Stroke Father     several   . Hypertension Father   . Varicose Veins Father   . Alcohol abuse Brother   . Hyperlipidemia Brother   . Hypertension Brother   . Early death Brother   . Varicose Veins Brother   . Varicose Veins Son   . Heart disease Brother   . Hypertension Brother   . Arthritis Brother     Gout    Allergies  Allergen Reactions  . Codeine Hives  . Aspirin     Noted caused 2 holes in retna, not sure   . Emetrol Rash    Current Outpatient Prescriptions on File Prior to  Visit  Medication Sig Dispense Refill  . fluticasone (FLONASE) 50 MCG/ACT nasal spray Place 2 sprays into both nostrils daily. 16 g 1  . hydrochlorothiazide (HYDRODIURIL) 25 MG tablet take 1 tablet by mouth once daily 30 tablet 3  . Ibuprofen (ADVIL) 200 MG CAPS Take by mouth as needed.     [name] levothyroxine (SYNTHROID, LEVOTHROID) 50 MCG tablet Take 1 tablet (50 mcg total) by mouth daily. 30 tablet 6  . loratadine (CLARITIN) 10 MG tablet Take 10 mg by mouth daily.    [name] omeprazole (PRILOSEC) 20 MG capsule take 1 capsule by mouth once daily 30 capsule 3  . Probiotic Product (PROBIOTIC DAILY PO) Take by mouth.    [name] ZETIA 10 MG tablet take 1 tablet by mouth once daily 30 tablet 5   No current facility-administered medications on file prior to visit.    BP 124/66 mmHg  Pulse 86  Temp(Src) 97.6 F (36.4 C) (Oral)  Ht 5\' 4"  (1.626 m)  Wt 213 lb 3.2 oz (96.707 kg)  BMI 36.58 kg/m2  SpO2 97%       Objective:   Physical Exam   General Mental Status- Alert. General Appearance- Not in acute distress.   Skin General: Color- Normal Color. Moisture- Normal Moisture. Faint pinkish/red appearance distal pretibial area but no warmth. Faint tender. 1+ pedal edema. No breakdown or ulcerations.  Neck Carotid Arteries- Normal color. Moisture- Normal Moisture. No carotid bruits. No JVD.  Chest and Lung Exam Auscultation:  Breath Sounds:-Normal. CTA.  Cardiovascular Auscultation:Rythm- Regular, Rate and rhythm. Murmurs & Other Heart Sounds:Auscultation of the heart reveals- No Murmurs.  Abdomen Inspection:-Inspeection Normal. Palpation/Percussion:Note:No mass. Palpation and Percussion of the abdomen reveal- Non Tender, Non Distended + BS, no rebound or guarding.    Neurologic Cranial Nerve exam:- CN III-XII intact(No nystagmus), symmetric smile. Strength:- 5/5 equal and symmetric strength both upper and lower extremities.  Lower ext- +homans sign and bilateral 1+ pedal edema.  Popliteal fossa direct tender to palpation.  Knees- good rom, midline scars over patella. No warmth or swelling of knees.  Feet- no redness or warm. Faint edema.     Assessment & Plan:

## 2014-06-13 NOTE — Telephone Encounter (Signed)
Pt notified of lower ext [place] results and cxr results.Both negative.

## 2014-06-14 ENCOUNTER — Telehealth: Payer: Self-pay | Admitting: Medical

## 2014-06-14 LAB — COMPREHENSIVE METABOLIC PANEL
ALT: 20 U/L (ref 0–35)
AST: 25 U/L (ref 0–37)
Albumin: 3.9 g/dL (ref 3.5–5.2)
Alkaline Phosphatase: 99 U/L (ref 39–117)
BUN: 27 mg/dL — ABNORMAL HIGH (ref 6–23)
CO2: 26 mEq/L (ref 19–32)
Calcium: 9.3 mg/dL (ref 8.4–10.5)
Chloride: 101 mEq/L (ref 96–112)
Creatinine, Ser: 1.24 mg/dL — ABNORMAL HIGH (ref 0.40–1.20)
GFR: 44.27 mL/min — ABNORMAL LOW (ref >60.00)
Glucose, Bld: 77 mg/dL (ref 70–99)
Potassium: 4.7 mEq/L (ref 3.5–5.1)
Sodium: 134 mEq/L — ABNORMAL LOW (ref 135–145)
Total Bilirubin: 0.4 mg/dL (ref 0.2–1.2)
Total Protein: 7.3 g/dL (ref 6.0–8.3)

## 2014-06-14 LAB — CBC WITH DIFFERENTIAL/PLATELET
Basophils Absolute: 0 10*3/uL (ref 0.0–0.1)
Basophils Relative: 0.1 % (ref 0.0–3.0)
Eosinophils Absolute: 0.1 10*3/uL (ref 0.0–0.7)
Eosinophils Relative: 1.7 % (ref 0.0–5.0)
HCT: 35.6 % — ABNORMAL LOW (ref 36.0–46.0)
Hemoglobin: 12 g/dL (ref 12.0–15.0)
Lymphocytes Relative: 21.4 % (ref 12.0–46.0)
Lymphs Abs: 1.8 10*3/uL (ref 0.7–4.0)
MCHC: 33.8 g/dL (ref 30.0–36.0)
MCV: 88 fl (ref 78.0–100.0)
Monocytes Absolute: 0.5 10*3/uL (ref 0.1–1.0)
Monocytes Relative: 6.1 % (ref 3.0–12.0)
Neutro Abs: 6 10*3/uL (ref 1.4–7.7)
Neutrophils Relative %: 70.7 % (ref 43.0–77.0)
Platelets: 310 10*3/uL (ref 150.0–400.0)
RBC: 4.05 Mil/uL (ref 3.87–5.11)
RDW: 14.5 % (ref 11.5–15.5)
WBC: 8.4 10*3/uL (ref 4.0–10.5)

## 2014-06-14 LAB — URIC ACID: Uric Acid, Serum: 7.1 mg/dL — ABNORMAL HIGH (ref 2.4–7.0)

## 2014-06-14 LAB — PRO B NATRIURETIC PEPTIDE: Pro B Natriuretic peptide (BNP): 465.4 pg/mL — ABNORMAL HIGH (ref <451)

## 2014-06-20 ENCOUNTER — Ambulatory Visit (INDEPENDENT_AMBULATORY_CARE_PROVIDER_SITE_OTHER): Payer: Medicare Other | Admitting: Medical

## 2014-06-20 ENCOUNTER — Encounter: Payer: Self-pay | Admitting: Medical

## 2014-06-20 VITALS — BP 140/74 | HR 77 | Temp 98.1°F | Ht 64.0 in | Wt 212.8 lb

## 2014-06-20 DIAGNOSIS — L089 Local infection of the skin and subcutaneous tissue, unspecified: Secondary | ICD-10-CM | POA: Diagnosis not present

## 2014-06-20 DIAGNOSIS — M1000 Idiopathic gout, unspecified site: Secondary | ICD-10-CM

## 2014-06-20 DIAGNOSIS — M109 Gout, unspecified: Secondary | ICD-10-CM | POA: Insufficient documentation

## 2014-06-20 LAB — COMPREHENSIVE METABOLIC PANEL
ALT: 19 U/L (ref 0–35)
AST: 24 U/L (ref 0–37)
Albumin: 3.8 g/dL (ref 3.5–5.2)
Alkaline Phosphatase: 105 U/L (ref 39–117)
BUN: 23 mg/dL (ref 6–23)
CO2: 28 mEq/L (ref 19–32)
Calcium: 9.4 mg/dL (ref 8.4–10.5)
Chloride: 98 mEq/L (ref 96–112)
Creatinine, Ser: 1.23 mg/dL — ABNORMAL HIGH (ref 0.40–1.20)
GFR: 44.69 mL/min — ABNORMAL LOW (ref >60.00)
Glucose, Bld: 79 mg/dL (ref 70–99)
Potassium: 4.4 mEq/L (ref 3.5–5.1)
Sodium: 132 mEq/L — ABNORMAL LOW (ref 135–145)
Total Bilirubin: 0.5 mg/dL (ref 0.2–1.2)
Total Protein: 7.1 g/dL (ref 6.0–8.3)

## 2014-06-20 LAB — URIC ACID: Uric Acid, Serum: 7.9 mg/dL — ABNORMAL HIGH (ref 2.4–7.0)

## 2014-06-20 NOTE — Assessment & Plan Note (Signed)
Pt did start keflex and pretibial redness and warmth did respond so likely had skin infection.

## 2014-06-20 NOTE — Progress Notes (Signed)
Subjective:    Patient ID: Kristen Manning, female    DOB: 09-28-1932, 81 y.o.   MRN: [id number]  HPI  Pt in states she is doing little better. Pt uric acid was mild elevated on last visit(Feet fell better). Pt has been taking alleve 2 tab po bid. Pt legs were also mild red. She did take the cephalexin that I made available. She states her pretibial areas feel better and were are less red and not warm to touch any more.   Pt last labs also showed some mild decrease gfr, low na and increase gfr. Pt last doppler was negative on both extremities.  No chest pain. Occasional mild transient sob with recent heat. But none presently.  Below is last visit complaint  Pt in states that her ankles and lower legs are recently more swollen that usual. She states usually Some mild swelling around ankles but most recently(over the weekend) below her knees all the way to her ankles and feet. Pt stats on Saturday the swelling was severe and legs felt hard and she could not move her toes well. Then today swelling went down and able to move toes. She states her legs area ankles are legs mild painful.   Recently with leg pain and swelling mild sob since over the weekend. But no chest pain. Was very minimal and transient.       Review of Systems  Constitutional: Negative for fever, chills and fatigue.  Respiratory: Negative for cough, chest tightness and wheezing.   Cardiovascular: Negative for chest pain and palpitations.  Musculoskeletal: Negative for back pain.       Feet pain better.   Skin:       Faint redness of pretibial area improved with antibiotic.  Neurological: Negative for dizziness, speech difficulty, weakness, numbness and headaches.  Hematological: Negative for adenopathy. Does not bruise/bleed easily.  Psychiatric/Behavioral: Negative for behavioral problems and confusion.     Past Medical History  Diagnosis Date  . Arthritis   . Hyperlipidemia     takes Zetia daily  .  Urinary incontinence   . Cancer     skin cancer, back, legs melonama  . GERD (gastroesophageal reflux disease)     takes Omeprazole daily  . Hypertension     takes HCTZ daily  . PONV (postoperative nausea and vomiting)   . Myocardial infarction     pt states EKG always shows infarct but no change from previous ekg;never knew anything about it  . Headache(784.0)   . Bacterial infection     in lungs in Jan 2015  . Shortness of breath     with exertion  . Pneumonia     hx of-many yrs ago  . History of migraine     last one about 15 yrs ago  . Osteoarthritis   . Joint swelling   . Joint pain   . Chronic back pain   . Urinary frequency   . Urinary urgency   . Nocturia     History   Social History  . Marital Status: Widowed    Spouse Name: N/A  . Number of Children: N/A  . Years of Education: N/A   Occupational History  . Not on file.   Social History Main Topics  . Smoking status: Never Smoker   . Smokeless tobacco: Never Used  . Alcohol Use: No  . Drug Use: No  . Sexual Activity: Not on file   Other Topics Concern  . Not on file  Social History Narrative    Past Surgical History  Procedure Laterality Date  . Knee surgery Bilateral 2009 and 2011  . Bladder tack  1998  . Tonsillectomy  as a child ? date  . Oophorectomy  1998    only one  . Blepharoplasty Bilateral   . Total knee arthroplasty Right 02/22/2013    DR LUCEY  . Total knee arthroplasty Right 02/22/2013    Procedure: TOTAL KNEE ARTHROPLASTY;  Surgeon: Vickey Huger, MD;  Location: [facility];  Service: Orthopedics;  Laterality: Right;  . Joint replacement Left 10/02/2009  . Joint replacement Right 02-22-13    Knee    Family History  Problem Relation Age of Onset  . Kidney disease Mother   . Heart disease Mother   . Hypertension Mother   . Varicose Veins Mother   . Alcohol abuse Father   . Stroke Father     several   . Hypertension Father   . Varicose Veins Father   . Alcohol abuse Brother     . Hyperlipidemia Brother   . Hypertension Brother   . Early death Brother   . Varicose Veins Brother   . Varicose Veins Son   . Heart disease Brother   . Hypertension Brother   . Arthritis Brother     Gout    Allergies  Allergen Reactions  . Codeine Hives  . Aspirin     Noted caused 2 holes in retna, not sure   . Emetrol Rash    Current Outpatient Prescriptions on File Prior to Visit  Medication Sig Dispense Refill  . cephALEXin (KEFLEX) 500 MG capsule Take 1 capsule (500 mg total) by mouth 2 (two) times daily. 14 capsule 0  . fluticasone (FLONASE) 50 MCG/ACT nasal spray Place 2 sprays into both nostrils daily. 16 g 1  . hydrochlorothiazide (HYDRODIURIL) 25 MG tablet take 1 tablet by mouth once daily 30 tablet 3  . levothyroxine (SYNTHROID, LEVOTHROID) 50 MCG tablet Take 1 tablet (50 mcg total) by mouth daily. 30 tablet 6  . loratadine (CLARITIN) 10 MG tablet Take 10 mg by mouth daily.    [name] omeprazole (PRILOSEC) 20 MG capsule take 1 capsule by mouth once daily 30 capsule 3  . Probiotic Product (PROBIOTIC DAILY PO) Take by mouth.    [name] ZETIA 10 MG tablet take 1 tablet by mouth once daily 30 tablet 5   No current facility-administered medications on file prior to visit.    BP 140/74 mmHg  Pulse 77  Temp(Src) 98.1 F (36.7 C) (Oral)  Ht 5\' 4"  (1.626 m)  Wt 212 lb 12.8 oz (96.525 kg)  BMI 36.51 kg/m2  SpO2 99%       Objective:   Physical Exam  General Mental Status- Alert. General Appearance- Not in acute distress.   Skin General: Color- Normal Color. Moisture- Normal Moisture. Faint pinkish/red appearance distal pretibial area but no warmth. a. No breakdown or ulcerations.  Neck Carotid Arteries- Normal color. Moisture- Normal Moisture. No carotid bruits. No JVD.  Chest and Lung Exam Auscultation:  Breath Sounds:-Normal. CTA.  Cardiovascular Auscultation:Rythm- Regular, Rate and rhythm. Murmurs & Other Heart Sounds:Auscultation of the heart reveals- No  Murmurs.  Abdomen Inspection:-Inspeection Normal. Palpation/Percussion:Note:No mass. Palpation and Percussion of the abdomen reveal- Non Tender, Non Distended + BS, no rebound or guarding.    Neurologic Cranial Nerve exam:- CN III-XII intact(No nystagmus), symmetric smile. Strength:- 5/5 equal and symmetric strength both upper and lower extremities.  Lower ext- neg homans sign and  bilateral 1+ pedal edema. NO redness or warmth.  Knees- good rom, midline scars over patella. No warmth or swelling of knees.  Feet- no redness or warm. Less edema. No pain on palpation.      Assessment & Plan:

## 2014-06-20 NOTE — Patient Instructions (Signed)
Gout Will get uric acid and cmp today. Clinically better. May need to rx uloric.  Skin infection Pt did start keflex and pretibial redness and warmth did respond so likely had skin infection.    Follow up date to be determined after labs back.

## 2014-06-20 NOTE — Assessment & Plan Note (Signed)
Will get uric acid and cmp today. Clinically better. May need to rx uloric.

## 2014-06-20 NOTE — Progress Notes (Signed)
Pre visit review using our clinic review tool, if applicable. No additional management support is needed unless otherwise documented below in the visit note. 

## 2014-06-22 ENCOUNTER — Telehealth: Payer: Self-pay | Admitting: Family Medicine

## 2014-06-22 ENCOUNTER — Other Ambulatory Visit: Payer: Self-pay | Admitting: Family Medicine

## 2014-06-22 NOTE — Telephone Encounter (Signed)
Dr. Birdie Riddle out of office. Dr. Larose Kells covering.

## 2014-06-22 NOTE — Telephone Encounter (Signed)
Caller name: Liv Relation to pt: self Call back number: [phone] Pharmacy:  Reason for call:   Patient states that she has not heard back from [place] regarding results from last week. She states that she has had another gout flare up. She states that she needs something for the pain and a maintenance med so this doesn't keep happening. Saw edward for this.

## 2014-06-22 NOTE — Telephone Encounter (Signed)
Patient calling back regarding this.  °

## 2014-06-23 ENCOUNTER — Telehealth: Payer: Self-pay | Admitting: Family Medicine

## 2014-06-23 ENCOUNTER — Other Ambulatory Visit: Payer: Self-pay | Admitting: Medical

## 2014-06-23 DIAGNOSIS — M1000 Idiopathic gout, unspecified site: Secondary | ICD-10-CM

## 2014-06-23 MED ORDER — COLCHICINE 0.6 MG PO TABS: ORAL_TABLET | ORAL | Status: DC

## 2014-06-23 MED ORDER — FEBUXOSTAT 40 MG PO TABS: 40.0000 mg | ORAL_TABLET | Freq: Every day | ORAL | Status: DC

## 2014-06-23 NOTE — Telephone Encounter (Signed)
Caller name: Camara  Relation to pt: self  Call back number: [phone]  Pt states she will be home the rest of the day. Please call her back.

## 2014-06-23 NOTE — Telephone Encounter (Signed)
Reason for call: Pt called to schedule repeat uric acid testing. Scheduled for 07/04/14. Please enter order. Please call pt with lab results from 06/20/14.

## 2014-06-23 NOTE — Telephone Encounter (Signed)
Order placed

## 2014-06-23 NOTE — Telephone Encounter (Signed)
Called patient to inform her there are two medications that have been sent to her pharmacy. Advised her to return in 2 weeks for repeat U Acid.  Patient agreed.

## 2014-06-23 NOTE — Telephone Encounter (Signed)
Prior authorization initiated for Uloric. An expedited determination was requested. [id number]

## 2014-06-23 NOTE — Telephone Encounter (Signed)
I called pt and wanted to speak with her since she left a message regarding gout flare. I will prescribe uloric and colcrys.

## 2014-06-28 DIAGNOSIS — M10071 Idiopathic gout, right ankle and foot: Secondary | ICD-10-CM | POA: Diagnosis not present

## 2014-06-28 DIAGNOSIS — M7752 Other enthesopathy of left foot: Secondary | ICD-10-CM | POA: Diagnosis not present

## 2014-06-28 DIAGNOSIS — M792 Neuralgia and neuritis, unspecified: Secondary | ICD-10-CM | POA: Diagnosis not present

## 2014-06-28 DIAGNOSIS — B351 Tinea unguium: Secondary | ICD-10-CM | POA: Diagnosis not present

## 2014-06-28 DIAGNOSIS — M7751 Other enthesopathy of right foot: Secondary | ICD-10-CM | POA: Diagnosis not present

## 2014-06-29 NOTE — Telephone Encounter (Signed)
Rx of uloric and colchicine sent in.

## 2014-07-04 ENCOUNTER — Telehealth: Payer: Self-pay | Admitting: Medical

## 2014-07-04 ENCOUNTER — Other Ambulatory Visit (INDEPENDENT_AMBULATORY_CARE_PROVIDER_SITE_OTHER): Payer: Medicare Other

## 2014-07-04 DIAGNOSIS — M1000 Idiopathic gout, unspecified site: Secondary | ICD-10-CM

## 2014-07-04 LAB — URIC ACID: Uric Acid, Serum: 7.3 mg/dL — ABNORMAL HIGH (ref 2.4–7.0)

## 2014-07-04 MED ORDER — FEBUXOSTAT 80 MG PO TABS: ORAL_TABLET | ORAL | Status: DC

## 2014-07-04 NOTE — Telephone Encounter (Signed)
PA approved.

## 2014-07-11 NOTE — Telephone Encounter (Signed)
Closing out chart.

## 2014-08-02 DIAGNOSIS — D329 Benign neoplasm of meninges, unspecified: Secondary | ICD-10-CM | POA: Diagnosis not present

## 2014-08-05 ENCOUNTER — Ambulatory Visit (HOSPITAL_COMMUNITY): Payer: Medicare Other

## 2014-08-10 ENCOUNTER — Ambulatory Visit: Payer: Medicare Other | Admitting: Neurology

## 2014-08-20 ENCOUNTER — Other Ambulatory Visit: Payer: Self-pay | Admitting: Family Medicine

## 2014-08-22 NOTE — Telephone Encounter (Signed)
Medication filled to pharmacy as requested.   

## 2014-09-08 ENCOUNTER — Telehealth: Payer: Self-pay | Admitting: Neurology

## 2014-09-08 MED ORDER — DIAZEPAM 5 MG PO TABS: ORAL_TABLET | ORAL | Status: DC

## 2014-09-08 NOTE — Telephone Encounter (Signed)
I returned call to patient. She had to reschedule her MRI that she had on 7/29 due to her getting upset while waiting to be called back to be scanned she states she's had a lot of family stuff going on and has had a lot of stress lately. She is going to have her MRI on this upcoming Tuesday and would like Rx for Valium to help calm her down. I told I would send in Rx to her pharmacy.

## 2014-09-08 NOTE — Telephone Encounter (Signed)
Pt called and wanted to get a prescription called in for some medication to take before she has her MRI done/Dawn CB# [phone]

## 2014-09-13 DIAGNOSIS — H5712 Ocular pain, left eye: Secondary | ICD-10-CM | POA: Diagnosis not present

## 2014-09-13 DIAGNOSIS — G9389 Other specified disorders of brain: Secondary | ICD-10-CM | POA: Diagnosis not present

## 2014-09-13 DIAGNOSIS — R51 Headache: Secondary | ICD-10-CM | POA: Diagnosis not present

## 2014-09-13 DIAGNOSIS — D329 Benign neoplasm of meninges, unspecified: Secondary | ICD-10-CM | POA: Diagnosis not present

## 2014-09-13 DIAGNOSIS — R42 Dizziness and giddiness: Secondary | ICD-10-CM | POA: Diagnosis not present

## 2014-09-14 ENCOUNTER — Encounter: Payer: Self-pay | Admitting: Neurology

## 2014-09-14 ENCOUNTER — Ambulatory Visit (INDEPENDENT_AMBULATORY_CARE_PROVIDER_SITE_OTHER): Payer: Medicare Other | Admitting: Neurology

## 2014-09-14 VITALS — BP 118/78 | HR 85 | Resp 16 | Ht 64.0 in | Wt 215.0 lb

## 2014-09-14 DIAGNOSIS — G44209 Tension-type headache, unspecified, not intractable: Secondary | ICD-10-CM | POA: Diagnosis not present

## 2014-09-14 DIAGNOSIS — D329 Benign neoplasm of meninges, unspecified: Secondary | ICD-10-CM | POA: Diagnosis not present

## 2014-09-14 NOTE — Patient Instructions (Addendum)
1. Follow-up in 1 year, call our office for any changes 2. Continue with seeing your counselor, Elbert Ewings in there!

## 2014-09-14 NOTE — Progress Notes (Signed)
NEUROLOGY FOLLOW UP OFFICE NOTE  Kristen Manning [id number]  HISTORY OF PRESENT ILLNESS: I had the pleasure of seeing Kristen Manning in follow-up in the neurology clinic on 09/14/2014.  The patient was last seen 7 months ago for worsening headaches, likely cervicogenic versus tension-type headaches. She had an MRI in 2015 which showed a small left temporal meningioma. She presents today to discuss interval follow-up MRI brain with and without contrast done yesterday which I personally reviewed and went over with the patient today. Meningioma is unchanged in size. She reports that headaches had improved, however recently she has been having more that she attributes to allergies, with blinding headaches across the frontal region, worse when she goes outdoors. She reports the headaches she was having previously have resolved, and current headaches are not occurring daily, relieved with Advil. She has occasional dizziness when driving for prolonged periods and standing up, lasting a few minutes. No falls. She has some left-sided neck pain. She denies any diplopia, focal numbness/tingling/weakness. She is undergoing significant stress with various family issues. She continues to see her counselor.  HPI: This is a pleasant 81 yo RH woman with a history of hypertension, hyperlipidemia, melanoma, who presented with headaches that started after knee surgery last February 2015. She had a difficult time during the admission, with no recollection of her hospital stay. She asked for hospital records which reported walking several steps, which she does not remember. She recalls waking up in pain, and "waking up" when she was to be transferred to the nursing home. She did not have a good experience at the SNF, and feels she had headaches at that time but was taking pain medications for her knee. She started to notice the daily headaches once she got home in March. She reports headaches are constant over the left  temporal region, usually a 2 or 3/10, but increasing in severity up to a 5 or 6/10. Headaches would radiate over the periorbital, frontal, and occipital regions, described as a throbbing pain that would cause some dizziness and nausea when more severe. She has a more intense headache today. She has noticed blurred vision in both eyes. She takes Advil 2-3 times a week when pain is more severe, but it only helps calm the pain down. She denies having any headache-free days in the past 79 months. She reports that she had trouble sleeping once she got home, bothered by the memories of other patients at the [facility] She was prescribed Zoloft by her PCP but did not take it due to listed side effects on the package. She reports that sleep is a little better now, she usually gets 6 hours of sleep but wakes up several times to urinate due to the HCTZ she takes at bedtime. She rarely takes her brother's Ativan to sleep (1-2/month per patient). She reports a history of migraines with her period when younger, but has been headache-free for 15 years until last March. She denies any family history of migraines.   Diagnostic Data: I personally reviewed her MRI brain with and without contrast with the patient, there is a moderate amount of chronic microvascular disease in the bilateral subcortical regions. There is a small 1.8 x 1.1 x 1.4 cm meningioma in the left temporal region with minimal mass effect, no edema. There is note of a prominent left PCA infundibulum extending off the left ICA versus small aneurysm, incompletely evaluated on this exam.  MRI brain 09/13/14 unchanged.  PAST MEDICAL HISTORY: Past Medical History  Diagnosis Date  . Arthritis   . Hyperlipidemia     takes Zetia daily  . Urinary incontinence   . Cancer     skin cancer, back, legs melonama  . GERD (gastroesophageal reflux disease)     takes Omeprazole daily  . Hypertension     takes HCTZ daily  . PONV (postoperative nausea and vomiting)   .  Myocardial infarction     pt states EKG always shows infarct but no change from previous ekg;never knew anything about it  . Headache(784.0)   . Bacterial infection     in lungs in Jan 2015  . Shortness of breath     with exertion  . Pneumonia     hx of-many yrs ago  . History of migraine     last one about 15 yrs ago  . Osteoarthritis   . Joint swelling   . Joint pain   . Chronic back pain   . Urinary frequency   . Urinary urgency   . Nocturia     MEDICATIONS: Current Outpatient Prescriptions on File Prior to Visit  Medication Sig Dispense Refill  . hydrochlorothiazide (HYDRODIURIL) 25 MG tablet take 1 tablet by mouth once daily 30 tablet 5  . levothyroxine (SYNTHROID, LEVOTHROID) 50 MCG tablet Take 1 tablet (50 mcg total) by mouth daily. 30 tablet 6  . loratadine (CLARITIN) 10 MG tablet Take 10 mg by mouth daily.    . naproxen sodium (ANAPROX) 220 MG tablet Take 220 mg by mouth 2 (two) times daily with a meal.    . omeprazole (PRILOSEC) 20 MG capsule take 1 capsule by mouth once daily (Patient taking differently: take 1 capsule by mouth as needed) 30 capsule 3  . Probiotic Product (PROBIOTIC DAILY PO) Take by mouth.    [name] ZETIA 10 MG tablet take 1 tablet by mouth once daily 30 tablet 5   No current facility-administered medications on file prior to visit.    ALLERGIES: Allergies  Allergen Reactions  . Codeine Hives  . Aspirin     Noted caused 2 holes in retna, not sure   . Emetrol Rash    FAMILY HISTORY: Family History  Problem Relation Age of Onset  . Kidney disease Mother   . Heart disease Mother   . Hypertension Mother   . Varicose Veins Mother   . Alcohol abuse Father   . Stroke Father     several   . Hypertension Father   . Varicose Veins Father   . Alcohol abuse Brother   . Hyperlipidemia Brother   . Hypertension Brother   . Early death Brother   . Varicose Veins Brother   . Varicose Veins Son   . Heart disease Brother   . Hypertension Brother     . Arthritis Brother     Gout    SOCIAL HISTORY: Social History   Social History  . Marital Status: Widowed    Spouse Name: N/A  . Number of Children: N/A  . Years of Education: N/A   Occupational History  . Not on file.   Social History Main Topics  . Smoking status: Never Smoker   . Smokeless tobacco: Never Used  . Alcohol Use: No  . Drug Use: No  . Sexual Activity: Not on file   Other Topics Concern  . Not on file   Social History Narrative    REVIEW OF SYSTEMS: Constitutional: No fevers, chills, or sweats, no generalized fatigue, change in appetite Eyes: No visual  changes, double vision, eye pain Ear, nose and throat: No hearing loss, ear pain, nasal congestion, sore throat Cardiovascular: No chest pain, palpitations Respiratory:  No shortness of breath at rest or with exertion, wheezes GastrointestinaI: No nausea, vomiting, diarrhea, abdominal pain, fecal incontinence Genitourinary:  No dysuria, urinary retention or frequency Musculoskeletal:  + neck pain, back pain Integumentary: No rash, pruritus, skin lesions Neurological: as above Psychiatric: + depression, insomnia, anxiety Endocrine: No palpitations, fatigue, diaphoresis, mood swings, change in appetite, change in weight, increased thirst Hematologic/Lymphatic:  No anemia, purpura, petechiae. Allergic/Immunologic: no itchy/runny eyes, nasal congestion, recent allergic reactions, rashes  PHYSICAL EXAM: Filed Vitals:   09/14/14 1428  BP: 118/78  Pulse: 85  Resp: 16   General: No acute distress Head:  Normocephalic/atraumatic Neck: supple, no paraspinal tenderness, full range of motion Heart:  Regular rate and rhythm Lungs:  Clear to auscultation bilaterally Back: No paraspinal tenderness Skin/Extremities: No rash, no edema Neurological Exam: alert and oriented to person, place, and time. No aphasia or dysarthria. Fund of knowledge is appropriate.  Recent and remote memory are intact.  Attention and  concentration are normal.    Able to name objects and repeat phrases. Cranial nerves: Pupils equal, round, reactive to light.  Fundoscopic exam unremarkable, no papilledema. Extraocular movements intact with no nystagmus. Visual fields full. Facial sensation intact. No facial asymmetry. Tongue, uvula, palate midline.  Motor: Bulk and tone normal, muscle strength 5/5 throughout with no pronator drift.  Sensation to light touch intact.  No extinction to double simultaneous stimulation.  Deep tendon reflexes + throughout, toes downgoing.  Finger to nose testing intact.  Gait narrow-based and steady, able to tandem walk adequately.  Romberg negative.  IMPRESSION: This is a pleasant 81 yo RH woman with a history of hypertension, hyperlipidemia, melanoma, who presented with new onset daily headaches since March 2015. MRI brain had shown an incidental finding of a small meningioma over the left temporal region, stable in size with repeat imaging yesterday. She reports the headaches have resolved, she is having a different type of headache now that appears more sinus/allergy-related, worse when she goes outdoors. Neurological exam normal. We discussed red flag symptoms to monitor for, at which point a repeat MRI brain will be ordered, otherwise meningioma has been stable with repeat imaging. She is dealing with a lot of family stress and continues to see her counselor. She knows to call our office for any changes and will follow-up in 1 year or earlier if needed.  Thank you for allowing me to participate in her care.  Please do not hesitate to call for any questions or concerns.  The duration of this appointment visit was 15 minutes of face-to-face time with the patient.  Greater than 50% of this time was spent in counseling, explanation of diagnosis, planning of further management, and coordination of care.   Ellouise Newer, M.D.   CC: Dr. Birdie Riddle

## 2014-09-15 DIAGNOSIS — R51 Headache: Secondary | ICD-10-CM

## 2014-09-15 DIAGNOSIS — R519 Headache, unspecified: Secondary | ICD-10-CM | POA: Insufficient documentation

## 2014-09-20 DIAGNOSIS — L97829 Non-pressure chronic ulcer of other part of left lower leg with unspecified severity: Secondary | ICD-10-CM | POA: Diagnosis not present

## 2014-09-20 DIAGNOSIS — I890 Lymphedema, not elsewhere classified: Secondary | ICD-10-CM | POA: Diagnosis not present

## 2014-09-20 DIAGNOSIS — L97819 Non-pressure chronic ulcer of other part of right lower leg with unspecified severity: Secondary | ICD-10-CM | POA: Diagnosis not present

## 2014-09-20 DIAGNOSIS — I739 Peripheral vascular disease, unspecified: Secondary | ICD-10-CM | POA: Diagnosis not present

## 2014-09-20 DIAGNOSIS — M7989 Other specified soft tissue disorders: Secondary | ICD-10-CM | POA: Diagnosis not present

## 2014-09-20 DIAGNOSIS — I83209 Varicose veins of unspecified lower extremity with both ulcer of unspecified site and inflammation: Secondary | ICD-10-CM | POA: Diagnosis not present

## 2014-09-21 ENCOUNTER — Encounter: Payer: Self-pay | Admitting: General Practice

## 2014-09-21 ENCOUNTER — Ambulatory Visit (INDEPENDENT_AMBULATORY_CARE_PROVIDER_SITE_OTHER): Payer: Medicare Other | Admitting: Family Medicine

## 2014-09-21 ENCOUNTER — Encounter: Payer: Self-pay | Admitting: Family Medicine

## 2014-09-21 VITALS — BP 120/78 | HR 68 | Temp 98.0°F | Resp 16 | Ht 64.0 in | Wt 214.0 lb

## 2014-09-21 DIAGNOSIS — Z23 Encounter for immunization: Secondary | ICD-10-CM

## 2014-09-21 DIAGNOSIS — E039 Hypothyroidism, unspecified: Secondary | ICD-10-CM | POA: Insufficient documentation

## 2014-09-21 DIAGNOSIS — I10 Essential (primary) hypertension: Secondary | ICD-10-CM | POA: Diagnosis not present

## 2014-09-21 DIAGNOSIS — E785 Hyperlipidemia, unspecified: Secondary | ICD-10-CM | POA: Diagnosis not present

## 2014-09-21 DIAGNOSIS — E038 Other specified hypothyroidism: Secondary | ICD-10-CM

## 2014-09-21 LAB — LIPID PANEL
Cholesterol: 165 mg/dL (ref 0–200)
HDL: 58.8 mg/dL (ref >39.00)
LDL Cholesterol: 86 mg/dL (ref 0–99)
NonHDL: 106.5
Total CHOL/HDL Ratio: 3
Triglycerides: 102 mg/dL (ref 0.0–149.0)
VLDL: 20.4 mg/dL (ref 0.0–40.0)

## 2014-09-21 LAB — HEPATIC FUNCTION PANEL
ALT: 13 U/L (ref 0–35)
AST: 15 U/L (ref 0–37)
Albumin: 3.8 g/dL (ref 3.5–5.2)
Alkaline Phosphatase: 101 U/L (ref 39–117)
Bilirubin, Direct: 0.1 mg/dL (ref 0.0–0.3)
Total Bilirubin: 0.5 mg/dL (ref 0.2–1.2)
Total Protein: 7.2 g/dL (ref 6.0–8.3)

## 2014-09-21 LAB — CBC WITH DIFFERENTIAL/PLATELET
Basophils Absolute: 0 10*3/uL (ref 0.0–0.1)
Basophils Relative: 0.3 % (ref 0.0–3.0)
Eosinophils Absolute: 0 10*3/uL (ref 0.0–0.7)
Eosinophils Relative: 0.3 % (ref 0.0–5.0)
HCT: 35.1 % — ABNORMAL LOW (ref 36.0–46.0)
Hemoglobin: 11.9 g/dL — ABNORMAL LOW (ref 12.0–15.0)
Lymphocytes Relative: 15.9 % (ref 12.0–46.0)
Lymphs Abs: 1.7 10*3/uL (ref 0.7–4.0)
MCHC: 33.9 g/dL (ref 30.0–36.0)
MCV: 87.8 fl (ref 78.0–100.0)
Monocytes Absolute: 0.8 10*3/uL (ref 0.1–1.0)
Monocytes Relative: 7.8 % (ref 3.0–12.0)
Neutro Abs: 8.2 10*3/uL — ABNORMAL HIGH (ref 1.4–7.7)
Neutrophils Relative %: 75.7 % (ref 43.0–77.0)
Platelets: 301 10*3/uL (ref 150.0–400.0)
RBC: 4 Mil/uL (ref 3.87–5.11)
RDW: 13.6 % (ref 11.5–15.5)
WBC: 10.8 10*3/uL — ABNORMAL HIGH (ref 4.0–10.5)

## 2014-09-21 LAB — BASIC METABOLIC PANEL
BUN: 24 mg/dL — ABNORMAL HIGH (ref 6–23)
CO2: 26 mEq/L (ref 19–32)
Calcium: 9.2 mg/dL (ref 8.4–10.5)
Chloride: 101 mEq/L (ref 96–112)
Creatinine, Ser: 1.03 mg/dL (ref 0.40–1.20)
GFR: 54.81 mL/min — ABNORMAL LOW (ref >60.00)
Glucose, Bld: 86 mg/dL (ref 70–99)
Potassium: 3.8 mEq/L (ref 3.5–5.1)
Sodium: 135 mEq/L (ref 135–145)

## 2014-09-21 LAB — TSH: TSH: 1.11 u[IU]/mL (ref 0.35–4.50)

## 2014-09-21 NOTE — Assessment & Plan Note (Signed)
New.  Noted on labs done at last visit.  Pt is tolerating Synthroid w/o difficulty.  Check labs.  Adjust meds prn

## 2014-09-21 NOTE — Progress Notes (Signed)
   Subjective:    Patient ID: Kristen Manning, female    DOB: 06/08/1932, 81 y.o.   MRN: [id number]  HPI HTN- chronic problem, on HCTZ daily.  Well controlled.  'i'm trying to eat healthier'.  No CP.  + SOB- chronic problem for pt.  No HAs, visual changes, edema.    Hyperlipidemia- chronic problem, on Zetia.  No abd pain, N/V, myalgias  Hypothyroid- new problem, on Levothyroxine.  Denies increased fatigue, changes to skin/hair/nails.   Review of Systems For ROS see HPI     Objective:   Physical Exam  Constitutional: She is oriented to person, place, and time. She appears well-developed and well-nourished. No distress.  HENT:  Head: Normocephalic and atraumatic.  Eyes: Conjunctivae and EOM are normal. Pupils are equal, round, and reactive to light.  Neck: Normal range of motion. Neck supple. No thyromegaly present.  Cardiovascular: Normal rate, regular rhythm, normal heart sounds and intact distal pulses.   No murmur heard. Pulmonary/Chest: Effort normal and breath sounds normal. No respiratory distress.  Abdominal: Soft. She exhibits no distension. There is no tenderness.  Musculoskeletal: She exhibits edema (trace edema of bilateral LEs).  Lymphadenopathy:    She has no cervical adenopathy.  Neurological: She is alert and oriented to person, place, and time.  Skin: Skin is warm and dry.  Psychiatric: She has a normal mood and affect. Her behavior is normal.  Vitals reviewed.         Assessment & Plan:

## 2014-09-21 NOTE — Assessment & Plan Note (Signed)
Chronic problem.  Well controlled on current medications.  Applauded her dietary changes.  Asymptomatic w/ exception of chronic SOB which is likely due to deconditioning.  Check labs.  No anticipated med changes.

## 2014-09-21 NOTE — Addendum Note (Signed)
Addended by: Davis Gourd on: 09/21/2014 09:16 AM   Modules accepted: Orders

## 2014-09-21 NOTE — Assessment & Plan Note (Signed)
Chronic problem.  Intolerant to statins.  On Zetia daily.  Stressed need for healthy diet and activity as able although pt is fairly limited.  Check labs.  Adjust meds prn

## 2014-09-21 NOTE — Progress Notes (Signed)
Pre visit review using our clinic review tool, if applicable. No additional management support is needed unless otherwise documented below in the visit note. 

## 2014-09-21 NOTE — Patient Instructions (Signed)
Schedule your complete physical in 6 months We'll notify you of your lab results and make any changes if needed Continue to work on healthy diet- you look great! Call with any questions or concerns If you want to join [place] at the new [place] office, any scheduled appointments will automatically transfer and we will see you at [address] [place] [address] [place], [place] [address]  Have a great fall and Happy Early Rudene Anda!!!

## 2014-09-30 ENCOUNTER — Encounter: Payer: Self-pay | Admitting: Neurology

## 2014-10-15 ENCOUNTER — Other Ambulatory Visit: Payer: Self-pay | Admitting: Family Medicine

## 2014-11-04 ENCOUNTER — Ambulatory Visit (INDEPENDENT_AMBULATORY_CARE_PROVIDER_SITE_OTHER): Payer: Medicare Other | Admitting: Internal Medicine

## 2014-11-04 ENCOUNTER — Encounter: Payer: Self-pay | Admitting: Internal Medicine

## 2014-11-04 VITALS — BP 126/74 | HR 84 | Temp 98.2°F | Ht 64.0 in | Wt 220.1 lb

## 2014-11-04 DIAGNOSIS — J0100 Acute maxillary sinusitis, unspecified: Secondary | ICD-10-CM | POA: Diagnosis not present

## 2014-11-04 MED ORDER — AMOXICILLIN 500 MG PO CAPS: 1000.0000 mg | ORAL_CAPSULE | Freq: Two times a day (BID) | ORAL | Status: DC

## 2014-11-04 NOTE — Progress Notes (Signed)
Pre visit review using our clinic review tool, if applicable. No additional management support is needed unless otherwise documented below in the visit note. 

## 2014-11-04 NOTE — Patient Instructions (Signed)
Rest, fluids , tylenol  For cough: Take Mucinex DM twice a day as needed until better  For nasal congestion Use OTC Nasocort or Flonase : 2 nasal sprays on each side of the nose daily until you feel better     Take the antibiotic as prescribed  (Amoxicillin)  Call if not gradually better over the next  10 days  Call anytime if the symptoms are severe

## 2014-11-04 NOTE — Progress Notes (Signed)
Subjective:    Patient ID: Kristen Manning, female    DOB: [birth year], 81 y.o.   MRN: [id number]  DOS:  11/04/2014 Type of visit - description : Acute visit Interval history: Symptoms started yesterday acutely with a frontal headache, nasal congestion, left maxillary pain and congestion. Later on developed some cough and wheezing. Denies a history of asthma, she is a nonsmoker.    Review of Systems No fever chills SOB only with episodes of cough. She has lower extremity edema at baseline. + Nasal discharge on and off, color? No myalgias.  Past Medical History  Diagnosis Date  . Arthritis   . Hyperlipidemia     takes Zetia daily  . Urinary incontinence   . Cancer ([place])     skin cancer, back, legs melonama  . GERD (gastroesophageal reflux disease)     takes Omeprazole daily  . Hypertension     takes HCTZ daily  . PONV (postoperative nausea and vomiting)   . Myocardial infarction [facility])     pt states EKG always shows infarct but no change from previous ekg;never knew anything about it  . Headache(784.0)   . Bacterial infection     in lungs in Jan 2015  . Shortness of breath     with exertion  . Pneumonia     hx of-many yrs ago  . History of migraine     last one about 15 yrs ago  . Osteoarthritis   . Joint swelling   . Joint pain   . Chronic back pain   . Urinary frequency   . Urinary urgency   . Nocturia     Past Surgical History  Procedure Laterality Date  . Knee surgery Bilateral 2009 and 2011  . Bladder tack  1998  . Tonsillectomy  as a child ? date  . Oophorectomy  1998    only one  . Blepharoplasty Bilateral   . Total knee arthroplasty Right 02/22/2013    DR LUCEY  . Total knee arthroplasty Right 02/22/2013    Procedure: TOTAL KNEE ARTHROPLASTY;  Surgeon: Vickey Huger, MD;  Location: [facility];  Service: Orthopedics;  Laterality: Right;  . Joint replacement Left 10/02/2009  . Joint replacement Right 02-22-13    Knee    Social History   Social  History  . Marital Status: Widowed    Spouse Name: N/A  . Number of Children: N/A  . Years of Education: N/A   Occupational History  . Not on file.   Social History Main Topics  . Smoking status: Never Smoker   . Smokeless tobacco: Never Used  . Alcohol Use: No  . Drug Use: No  . Sexual Activity: Not on file   Other Topics Concern  . Not on file   Social History Narrative        Medication List       This list is accurate as of: 11/04/14  1:49 PM.  Always use your most recent med list.               amoxicillin 500 MG capsule  Commonly known as:  AMOXIL  Take 2 capsules (1,000 mg total) by mouth 2 (two) times daily.     hydrochlorothiazide 25 MG tablet  Commonly known as:  HYDRODIURIL  take 1 tablet by mouth once daily     ibuprofen 100 MG tablet  Commonly known as:  ADVIL,MOTRIN  Take 100 mg by mouth every 6 (six) hours as needed for pain or  fever.     levothyroxine 50 MCG tablet  Commonly known as:  SYNTHROID, LEVOTHROID  take 1 tablet by mouth once daily     loratadine 10 MG tablet  Commonly known as:  CLARITIN  Take 10 mg by mouth daily.     naproxen sodium 220 MG tablet  Commonly known as:  ANAPROX  Take 220 mg by mouth 2 (two) times daily with a meal.     omeprazole 20 MG capsule  Commonly known as:  PRILOSEC  take 1 capsule by mouth once daily     PROBIOTIC DAILY PO  Take by mouth.     ZETIA 10 MG tablet  Generic drug:  ezetimibe  take 1 tablet by mouth once daily           Objective:   Physical Exam BP 126/74 mmHg  Pulse 84  Temp(Src) 98.2 F (36.8 C) (Oral)  Ht 5\' 4"  (1.626 m)  Wt 220 lb 2 oz (99.848 kg)  BMI 37.77 kg/m2  SpO2 96%  General:   Well developed, well nourished . NAD.  HEENT:  Normocephalic . Face symmetric, atraumatic. TMs normal. EOMI. Throat symmetric, no red. Nose congested, left maxillary sinus quite tender to palpation. Lungs:  CTA B, no wheezing Normal respiratory effort, no intercostal retractions, no  accessory muscle use. Heart: RRR,  no murmur.  No pretibial edema bilaterally  Skin: Not pale. Not jaundice Neurologic:  alert & oriented X3.  Speech normal, gait appropriate for age and unassisted Psych--  Cognition and judgment appear intact.  Cooperative with normal attention span and concentration.  Behavior appropriate. No anxious or depressed appearing.      Assessment & Plan:   Maxillary sinusitis Although symptoms started yesterday, she seems to be very tender at the left maxillary area. Likely she has early sinusitis. Plan: Amoxicillin, see instructions. Knows to call if not improving

## 2014-11-15 DIAGNOSIS — I890 Lymphedema, not elsewhere classified: Secondary | ICD-10-CM | POA: Diagnosis not present

## 2014-11-16 ENCOUNTER — Telehealth: Payer: Self-pay | Admitting: Internal Medicine

## 2014-11-16 MED ORDER — PREDNISONE 20 MG PO TABS: 20.0000 mg | ORAL_TABLET | Freq: Every day | ORAL | Status: DC

## 2014-11-16 MED ORDER — AZITHROMYCIN 250 MG PO TABS: ORAL_TABLET | ORAL | Status: DC

## 2014-11-16 NOTE — Telephone Encounter (Signed)
Please advise 

## 2014-11-16 NOTE — Telephone Encounter (Signed)
Recommend the following:  Z-Pak, send a Rx Prednisone 20 mg one tablet daily for 5 days #5 no refills. If no better in few days, needs office visit.

## 2014-11-16 NOTE — Telephone Encounter (Signed)
Spoke with Pt, informed her that Rx has been sent for Z-pak and Prednisone, Pt instructed on how to take both medications and informed that if she is not feeling better in several days or becomes worse that she will need another OV. Pt verbalized understanding.

## 2014-11-16 NOTE — Addendum Note (Signed)
Addended by: Wilfrid Lund on: 11/16/2014 11:22 AM   Modules accepted: Orders

## 2014-11-16 NOTE — Telephone Encounter (Signed)
Pt was in on 11/04/14 and saw Dr. Larose Kells for a sinus infection. She completed 10 days of antibiotics and is still having the same symptoms. She has not improved but is not worse. She is asking if something else can be sent to the pharmacy for her.   [facility] on [address]

## 2014-11-23 ENCOUNTER — Encounter: Payer: Self-pay | Admitting: Family Medicine

## 2014-11-23 ENCOUNTER — Ambulatory Visit (INDEPENDENT_AMBULATORY_CARE_PROVIDER_SITE_OTHER): Payer: Medicare Other | Admitting: Family Medicine

## 2014-11-23 VITALS — BP 138/86 | HR 93 | Temp 97.9°F | Resp 16 | Ht 64.0 in | Wt 218.5 lb

## 2014-11-23 DIAGNOSIS — J0100 Acute maxillary sinusitis, unspecified: Secondary | ICD-10-CM | POA: Diagnosis not present

## 2014-11-23 MED ORDER — SULFAMETHOXAZOLE-TRIMETHOPRIM 800-160 MG PO TABS: 1.0000 | ORAL_TABLET | Freq: Two times a day (BID) | ORAL | Status: DC

## 2014-11-23 NOTE — Assessment & Plan Note (Signed)
Pt is exquisitely TTP over frontal and maxillary sinuses despite 2 rounds of abx and a course of prednisone.  Concern for ongoing infxn.  Will broaden abx coverage w/ bactrim.  Start daily antihistamine.  Reviewed supportive care and red flags that should prompt return.  Pt expressed understanding and is in agreement w/ plan.

## 2014-11-23 NOTE — Progress Notes (Signed)
   Subjective:    Patient ID: Kristen Manning, female    DOB: 12/21/1932, 81 y.o.   MRN: [id number]  HPI Sinus infxn- pt was seen 10/28 and tx'd w/ Amox.  Called back on 11/9 b/c sxs were not improving.  Was started on Zpack and Prednisone w/ some improvement.  Continues to have frontal and maxillary pain, HA, L ear pain, sore throat.  Mild cough.  + back pain.  No fevers but 'i feel hot and sweat'.  No known sick contacts but brother got similar sxs from her.   Review of Systems For ROS see HPI     Objective:   Physical Exam  Constitutional: She appears well-developed and well-nourished. No distress.  HENT:  Head: Normocephalic and atraumatic.  Right Ear: Tympanic membrane normal.  Left Ear: Tympanic membrane normal.  Nose: Mucosal edema and rhinorrhea present. Right sinus exhibits maxillary sinus tenderness and frontal sinus tenderness. Left sinus exhibits maxillary sinus tenderness and frontal sinus tenderness.  Mouth/Throat: Uvula is midline and mucous membranes are normal. Posterior oropharyngeal erythema present. No oropharyngeal exudate.  Eyes: Conjunctivae and EOM are normal. Pupils are equal, round, and reactive to light.  Neck: Normal range of motion. Neck supple.  Cardiovascular: Normal rate, regular rhythm and normal heart sounds.   Pulmonary/Chest: Effort normal and breath sounds normal. No respiratory distress. She has no wheezes.  Lymphadenopathy:    She has no cervical adenopathy.  Vitals reviewed.         Assessment & Plan:

## 2014-11-23 NOTE — Patient Instructions (Signed)
Follow up as needed Start the Bactrim twice daily- take w/ food Drink plenty of fluids Add OTC claritin/zyrtec if not already taking REST! Call with any questions or concerns If you want to join [place] at the new [place] office, any scheduled appointments will automatically transfer and we will see you at [address] [place] [address] [place], [place] [address] (OPENING 01/10/15) Happy Holidays!!

## 2014-11-23 NOTE — Progress Notes (Signed)
Pre visit review using our clinic review tool, if applicable. No additional management support is needed unless otherwise documented below in the visit note. 

## 2014-12-20 ENCOUNTER — Ambulatory Visit (INDEPENDENT_AMBULATORY_CARE_PROVIDER_SITE_OTHER): Payer: Medicare Other | Admitting: Medical

## 2014-12-20 ENCOUNTER — Encounter: Payer: Self-pay | Admitting: Medical

## 2014-12-20 ENCOUNTER — Telehealth: Payer: Self-pay | Admitting: Medical

## 2014-12-20 VITALS — BP 124/78 | HR 78 | Temp 97.5°F | Ht 64.0 in | Wt 222.4 lb

## 2014-12-20 DIAGNOSIS — R61 Generalized hyperhidrosis: Secondary | ICD-10-CM | POA: Diagnosis not present

## 2014-12-20 DIAGNOSIS — J029 Acute pharyngitis, unspecified: Secondary | ICD-10-CM

## 2014-12-20 DIAGNOSIS — J320 Chronic maxillary sinusitis: Secondary | ICD-10-CM | POA: Diagnosis not present

## 2014-12-20 LAB — CBC WITH DIFFERENTIAL/PLATELET
Basophils Absolute: 0 10*3/uL (ref 0.0–0.1)
Basophils Relative: 0.5 % (ref 0.0–3.0)
Eosinophils Absolute: 0.1 10*3/uL (ref 0.0–0.7)
Eosinophils Relative: 1.3 % (ref 0.0–5.0)
HCT: 37.3 % (ref 36.0–46.0)
Hemoglobin: 12.6 g/dL (ref 12.0–15.0)
Lymphocytes Relative: 20.7 % (ref 12.0–46.0)
Lymphs Abs: 1.8 10*3/uL (ref 0.7–4.0)
MCHC: 33.9 g/dL (ref 30.0–36.0)
MCV: 87.6 fl (ref 78.0–100.0)
Monocytes Absolute: 0.8 10*3/uL (ref 0.1–1.0)
Monocytes Relative: 9.3 % (ref 3.0–12.0)
Neutro Abs: 5.9 10*3/uL (ref 1.4–7.7)
Neutrophils Relative %: 68.2 % (ref 43.0–77.0)
Platelets: 325 10*3/uL (ref 150.0–400.0)
RBC: 4.25 Mil/uL (ref 3.87–5.11)
RDW: 13.8 % (ref 11.5–15.5)
WBC: 8.6 10*3/uL (ref 4.0–10.5)

## 2014-12-20 LAB — POCT RAPID STREP A (OFFICE): Rapid Strep A Screen: NEGATIVE

## 2014-12-20 MED ORDER — CLARITHROMYCIN ER 500 MG PO TB24: 1000.0000 mg | ORAL_TABLET | Freq: Every day | ORAL | Status: DC

## 2014-12-20 MED ORDER — CEFTRIAXONE SODIUM 1 G IJ SOLR
1.0000 g | Freq: Once | INTRAMUSCULAR | Status: AC
  Administered 2014-12-20: 1 g via INTRAMUSCULAR

## 2014-12-20 NOTE — Progress Notes (Signed)
Pre visit review using our clinic review tool, if applicable. No additional management support is needed unless otherwise documented below in the visit note. 

## 2014-12-20 NOTE — Progress Notes (Signed)
Subjective:    Patient ID: Kristen Manning, female    DOB: 04/07/1932, 81 y.o.   MRN: [id number]  HPI Pt in stating that she had some sinus pressure on and off since 11/04/2014. She has taken amoxicillin, zpack with prednisone and then last took bactrim DS. Each time she would experience relief of sinus pressure and ear pain but then symptoms would return. Most of symptoms were on he left side. Pt does admit to getting sinus infections easy each year. Pt last treated about 3 wks ago.  Pt states her throat is sore as well.   Review of Systems  Constitutional: Positive for diaphoresis. Negative for fever, chills and fatigue.       Occasional at night she wakes and feels sweaty.  HENT: Positive for sinus pressure and sore throat.   Respiratory: Negative for cough and chest tightness.   Cardiovascular: Negative for chest pain and palpitations.  Skin: Negative for rash.  Neurological: Negative for dizziness, speech difficulty, weakness, numbness and headaches.  Hematological: Negative for adenopathy. Does not bruise/bleed easily.  Psychiatric/Behavioral: Negative for behavioral problems and confusion.    Past Medical History  Diagnosis Date  . Arthritis   . Hyperlipidemia     takes Zetia daily  . Urinary incontinence   . Cancer ([place])     skin cancer, back, legs melonama  . GERD (gastroesophageal reflux disease)     takes Omeprazole daily  . Hypertension     takes HCTZ daily  . PONV (postoperative nausea and vomiting)   . Myocardial infarction [facility])     pt states EKG always shows infarct but no change from previous ekg;never knew anything about it  . Headache(784.0)   . Bacterial infection     in lungs in Jan 2015  . Shortness of breath     with exertion  . Pneumonia     hx of-many yrs ago  . History of migraine     last one about 15 yrs ago  . Osteoarthritis   . Joint swelling   . Joint pain   . Chronic back pain   . Urinary frequency   . Urinary urgency   . Nocturia      Social History   Social History  . Marital Status: Widowed    Spouse Name: N/A  . Number of Children: N/A  . Years of Education: N/A   Occupational History  . Not on file.   Social History Main Topics  . Smoking status: Never Smoker   . Smokeless tobacco: Never Used  . Alcohol Use: No  . Drug Use: No  . Sexual Activity: Not on file   Other Topics Concern  . Not on file   Social History Narrative    Past Surgical History  Procedure Laterality Date  . Knee surgery Bilateral 2009 and 2011  . Bladder tack  1998  . Tonsillectomy  as a child ? date  . Oophorectomy  1998    only one  . Blepharoplasty Bilateral   . Total knee arthroplasty Right 02/22/2013    DR LUCEY  . Total knee arthroplasty Right 02/22/2013    Procedure: TOTAL KNEE ARTHROPLASTY;  Surgeon: Vickey Huger, MD;  Location: [place];  Service: Orthopedics;  Laterality: Right;  . Joint replacement Left 10/02/2009  . Joint replacement Right 02-22-13    Knee    Family History  Problem Relation Age of Onset  . Kidney disease Mother   . Heart disease Mother   .  Hypertension Mother   . Varicose Veins Mother   . Alcohol abuse Father   . Stroke Father     several   . Hypertension Father   . Varicose Veins Father   . Alcohol abuse Brother   . Hyperlipidemia Brother   . Hypertension Brother   . Early death Brother   . Varicose Veins Brother   . Varicose Veins Son   . Heart disease Brother   . Hypertension Brother   . Arthritis Brother     Gout    Allergies  Allergen Reactions  . Codeine Hives  . Aspirin     Noted caused 2 holes in retna, not sure   . Emetrol Rash    Current Outpatient Prescriptions on File Prior to Visit  Medication Sig Dispense Refill  . hydrochlorothiazide (HYDRODIURIL) 25 MG tablet take 1 tablet by mouth once daily 30 tablet 5  . ibuprofen (ADVIL,MOTRIN) 100 MG tablet Take 100 mg by mouth every 6 (six) hours as needed for pain or fever.    . levothyroxine (SYNTHROID,  LEVOTHROID) 50 MCG tablet take 1 tablet by mouth once daily 30 tablet 6  . loratadine (CLARITIN) 10 MG tablet Take 10 mg by mouth daily.    . naproxen sodium (ANAPROX) 220 MG tablet Take 220 mg by mouth 2 (two) times daily with a meal.    . omeprazole (PRILOSEC) 20 MG capsule take 1 capsule by mouth once daily (Patient taking differently: take 1 capsule by mouth as needed) 30 capsule 3  . Probiotic Product (PROBIOTIC DAILY PO) Take by mouth.    [name] ZETIA 10 MG tablet take 1 tablet by mouth once daily 30 tablet 5   No current facility-administered medications on file prior to visit.    BP 124/78 mmHg  Pulse 78  Temp(Src) 97.5 F (36.4 C) (Oral)  Ht 5\' 4"  (1.626 m)  Wt 222 lb 6.4 oz (100.88 kg)  BMI 38.16 kg/m2  SpO2 97%       Objective:   Physical Exam   General  Mental Status - Alert. General Appearance - Well groomed. Not in acute distress.  Skin Rashes- No Rashes.  HEENT Head- Normal. Ear Auditory Canal - Left- Normal. Right - Normal.Tympanic Membrane- Left- Normal. Right- Normal. Eye Sclera/Conjunctiva- Left- Normal. Right- Normal. Nose & Sinuses Nasal Mucosa- Left-  Boggy and Congested. Right-  Boggy and  Congested.Bilateral  Lt  maxillary and lt  frontal sinus pressure. Mouth & Throat Lips: Upper Lip- Normal: no dryness, cracking, pallor, cyanosis, or vesicular eruption. Lower Lip-Normal: no dryness, cracking, pallor, cyanosis or vesicular eruption. Buccal Mucosa- Bilateral- No Aphthous ulcers. Oropharynx- No Discharge or Erythema. Tonsils: Characteristics- Bilateral- No Erythema or Congestion. Size/Enlargement- Bilateral- No enlargement. Discharge- bilateral-None.  Neck Neck- Supple. No Masses.   Chest and Lung Exam Auscultation: Breath Sounds:-Clear even and unlabored.  Cardiovascular Auscultation:Rythm- Regular, rate and rhythm. Murmurs & Other Heart Sounds:Ausculatation of the heart reveal- No Murmurs.  Lymphatic Head & Neck General Head & Neck  Lymphatics: Bilateral: Description- No Localized lymphadenopathy.        Assessment & Plan:  You have had recurrent sinus infection symptoms of about 6 weeks. We gave you rocephin 1 gram im today. Also start biaxin xl tomorrow. Continue you steroid nasal spray.  Follow up with me this coming Monday. May give another rocpehin im in light of fact this is your 4th round of antibiotic.  Also I am going to go ahead and try to refer you to ENT.  Appointment for 2-3 wks. You may in end need CT of your sinus and antibiotic such as levofloxin.  Follow up Monday or as needed

## 2014-12-20 NOTE — Telephone Encounter (Signed)
On Kristen Manning. I decided to give biaxin. Not doxy.

## 2014-12-20 NOTE — Patient Instructions (Addendum)
You have had recurrent sinus infection symptoms of about 6 weeks. We gave you rocephin 1 gram im today. Also start biaxin xl tomorrow. Continue you steroid nasal spray.  Follow up with me this coming Monday. May give another rocpehin im in light of fact this is your 4th round of antibiotic.  Also I am going to go ahead and try to refer you to ENT. Appointment for 2-3 wks. You may in end need CT of your sinus and antibiotic such as levofloxin.  Follow up Monday or as needed

## 2014-12-26 ENCOUNTER — Ambulatory Visit (INDEPENDENT_AMBULATORY_CARE_PROVIDER_SITE_OTHER): Payer: Medicare Other | Admitting: Family Medicine

## 2014-12-26 ENCOUNTER — Encounter: Payer: Self-pay | Admitting: Family Medicine

## 2014-12-26 ENCOUNTER — Encounter (INDEPENDENT_AMBULATORY_CARE_PROVIDER_SITE_OTHER): Payer: Self-pay

## 2014-12-26 VITALS — BP 122/82 | HR 92 | Temp 98.0°F | Resp 16 | Ht 64.0 in | Wt 222.4 lb

## 2014-12-26 DIAGNOSIS — J0101 Acute recurrent maxillary sinusitis: Secondary | ICD-10-CM

## 2014-12-26 MED ORDER — CLARITHROMYCIN ER 500 MG PO TB24: 1000.0000 mg | ORAL_TABLET | Freq: Every day | ORAL | Status: DC

## 2014-12-26 NOTE — Assessment & Plan Note (Signed)
Pt sxs are improving but 7 days is likely not enough abx given her recurrent infxns.  Will extend for another 7 days- total of 14 days.  Reviewed supportive care and red flags that should prompt return.  Pt expressed understanding and is in agreement w/ plan.

## 2014-12-26 NOTE — Progress Notes (Signed)
Pre visit review using our clinic review tool, if applicable. No additional management support is needed unless otherwise documented below in the visit note. 

## 2014-12-26 NOTE — Patient Instructions (Signed)
Follow up as needed Drink plenty of fluids Continue the Biaxin for another week Mucinex to thin your chest congestion and drainage REST!!! Call with any questions or concerns If you want to join [place] at the new [place] office, any scheduled appointments will automatically transfer and we will see you at [address] [place] [address] [place], [place] [id number] (OPENING 01/10/15) Happy Holidays!!!

## 2014-12-26 NOTE — Progress Notes (Signed)
   Subjective:    Patient ID: Kristen Manning, female    DOB: [birth year], 81 y.o.   MRN: [id number]  HPI Sinusitis- pt has had multiple episodes this fall.  Currently on day 6 of 7 of Biaxin.  Pt reports feeling 'a little better' but not back to normal.  Nasal drainage is now clear but cough was productive of thick, yellow sputum.  Continues to have some maxillary pain but better than previous.  Sore throat is improving.  No fevers.  Review of Systems For ROS see HPI     Objective:   Physical Exam  Constitutional: She appears well-developed and well-nourished. No distress.  HENT:  Head: Normocephalic and atraumatic.  Right Ear: Tympanic membrane normal.  Left Ear: Tympanic membrane normal.  Nose: Mucosal edema and rhinorrhea present. Right sinus exhibits maxillary sinus tenderness. Right sinus exhibits no frontal sinus tenderness. Left sinus exhibits maxillary sinus tenderness. Left sinus exhibits no frontal sinus tenderness.  Mouth/Throat: Uvula is midline and mucous membranes are normal. Posterior oropharyngeal erythema present. No oropharyngeal exudate.  Eyes: Conjunctivae and EOM are normal. Pupils are equal, round, and reactive to light.  Neck: Normal range of motion. Neck supple.  Cardiovascular: Normal rate, regular rhythm and normal heart sounds.   Pulmonary/Chest: Effort normal and breath sounds normal. No respiratory distress. She has no wheezes.  Lymphadenopathy:    She has no cervical adenopathy.  Vitals reviewed.         Assessment & Plan:

## 2015-01-01 ENCOUNTER — Other Ambulatory Visit: Payer: Self-pay | Admitting: Internal Medicine

## 2015-01-03 NOTE — Telephone Encounter (Signed)
Medication filled to pharmacy as requested.   

## 2015-01-10 DIAGNOSIS — J329 Chronic sinusitis, unspecified: Secondary | ICD-10-CM | POA: Diagnosis not present

## 2015-01-10 DIAGNOSIS — R51 Headache: Secondary | ICD-10-CM | POA: Diagnosis not present

## 2015-01-10 DIAGNOSIS — J342 Deviated nasal septum: Secondary | ICD-10-CM | POA: Diagnosis not present

## 2015-01-10 DIAGNOSIS — K219 Gastro-esophageal reflux disease without esophagitis: Secondary | ICD-10-CM | POA: Diagnosis not present

## 2015-01-18 DIAGNOSIS — J189 Pneumonia, unspecified organism: Secondary | ICD-10-CM | POA: Diagnosis not present

## 2015-01-23 ENCOUNTER — Encounter: Payer: Self-pay | Admitting: Medical

## 2015-01-23 ENCOUNTER — Telehealth: Payer: Self-pay | Admitting: Family Medicine

## 2015-01-23 ENCOUNTER — Ambulatory Visit (HOSPITAL_BASED_OUTPATIENT_CLINIC_OR_DEPARTMENT_OTHER)
Admission: RE | Admit: 2015-01-23 | Discharge: 2015-01-23 | Disposition: A | Discharge status: Home / Self Care | Payer: Medicare Other | Source: Ambulatory Visit | Attending: Medical | Admitting: Medical

## 2015-01-23 ENCOUNTER — Ambulatory Visit (INDEPENDENT_AMBULATORY_CARE_PROVIDER_SITE_OTHER): Payer: Medicare Other | Admitting: Medical

## 2015-01-23 VITALS — BP 120/80 | HR 77 | Temp 97.1°F | Ht 64.0 in | Wt 220.0 lb

## 2015-01-23 DIAGNOSIS — R0989 Other specified symptoms and signs involving the circulatory and respiratory systems: Secondary | ICD-10-CM | POA: Insufficient documentation

## 2015-01-23 DIAGNOSIS — R05 Cough: Secondary | ICD-10-CM | POA: Diagnosis not present

## 2015-01-23 DIAGNOSIS — J209 Acute bronchitis, unspecified: Secondary | ICD-10-CM | POA: Diagnosis not present

## 2015-01-23 DIAGNOSIS — R062 Wheezing: Secondary | ICD-10-CM

## 2015-01-23 DIAGNOSIS — R5383 Other fatigue: Secondary | ICD-10-CM

## 2015-01-23 DIAGNOSIS — R059 Cough, unspecified: Secondary | ICD-10-CM

## 2015-01-23 MED ORDER — BECLOMETHASONE DIPROPIONATE 40 MCG/ACT IN AERS: 2.0000 | INHALATION_SPRAY | Freq: Two times a day (BID) | RESPIRATORY_TRACT | Status: DC

## 2015-01-23 MED ORDER — ALBUTEROL SULFATE HFA 108 (90 BASE) MCG/ACT IN AERS: 2.0000 | INHALATION_SPRAY | Freq: Four times a day (QID) | RESPIRATORY_TRACT | Status: DC | PRN

## 2015-01-23 MED ORDER — CEFTRIAXONE SODIUM 1 G IJ SOLR
1.0000 g | Freq: Once | INTRAMUSCULAR | Status: AC
  Administered 2015-01-23: 1 g via INTRAMUSCULAR

## 2015-01-23 MED ORDER — PREDNISONE 20 MG PO TABS: ORAL_TABLET | ORAL | Status: DC

## 2015-01-23 MED ORDER — HYDROCODONE-HOMATROPINE 5-1.5 MG/5ML PO SYRP: 5.0000 mL | ORAL_SOLUTION | Freq: Three times a day (TID) | ORAL | Status: DC | PRN

## 2015-01-23 NOTE — Progress Notes (Signed)
Subjective:    Patient ID: Kristen Manning, female    DOB: [birth year], 82 y.o.   MRN: [id number]  HPI   Pt this about 10 days started to cough a lot. Pt was treated at [facility] and given antibiotic past Wednesday. She was treated for pneumonia and placed on antibiotic. Pt states she called back and stated lungs clear by cxr but still had pneumonia. Pt has 4 more days of levofloxin. Has taken 6 ays of the levofloxin.  Pt is still coughing. Pt bringing up mucous when she coughs. Pt was given also some prednisone. She was given 6 day course. Pt still has cough and has some pain in her ribs. She states pain is severe when she coughs.   Pt did get nebulizer treatment other day at [facility].  Some better after above treatment but not much.   Review of Systems  Constitutional: Positive for fatigue. Negative for fever and chills.  HENT: Positive for congestion. Negative for ear pain, postnasal drip, sinus pressure, sneezing and sore throat.   Respiratory: Positive for cough and wheezing. Negative for shortness of breath.   Cardiovascular: Negative for chest pain and palpitations.  Gastrointestinal: Negative for abdominal pain.  Musculoskeletal: Negative for back pain.  Neurological: Negative for dizziness, speech difficulty, weakness, numbness and headaches.  Hematological: Negative for adenopathy. Does not bruise/bleed easily.  Psychiatric/Behavioral: Negative for behavioral problems and confusion.    Past Medical History  Diagnosis Date  . Arthritis   . Hyperlipidemia     takes Zetia daily  . Urinary incontinence   . Cancer ([place])     skin cancer, back, legs melonama  . GERD (gastroesophageal reflux disease)     takes Omeprazole daily  . Hypertension     takes HCTZ daily  . PONV (postoperative nausea and vomiting)   . Myocardial infarction [facility])     pt states EKG always shows infarct but no change from previous ekg;never knew anything about it  . Headache(784.0)   . Bacterial  infection     in lungs in Jan 2015  . Shortness of breath     with exertion  . Pneumonia     hx of-many yrs ago  . History of migraine     last one about 15 yrs ago  . Osteoarthritis   . Joint swelling   . Joint pain   . Chronic back pain   . Urinary frequency   . Urinary urgency   . Nocturia     Social History   Social History  . Marital Status: Widowed    Spouse Name: N/A  . Number of Children: N/A  . Years of Education: N/A   Occupational History  . Not on file.   Social History Main Topics  . Smoking status: Never Smoker   . Smokeless tobacco: Never Used  . Alcohol Use: No  . Drug Use: No  . Sexual Activity: Not on file   Other Topics Concern  . Not on file   Social History Narrative    Past Surgical History  Procedure Laterality Date  . Knee surgery Bilateral 2009 and 2011  . Bladder tack  1998  . Tonsillectomy  as a child ? date  . Oophorectomy  1998    only one  . Blepharoplasty Bilateral   . Total knee arthroplasty Right 02/22/2013    DR LUCEY  . Total knee arthroplasty Right 02/22/2013    Procedure: TOTAL KNEE ARTHROPLASTY;  Surgeon: Vickey Huger, MD;  Location:  [place] [facility];  Service: Orthopedics;  Laterality: Right;  . Joint replacement Left 10/02/2009  . Joint replacement Right 02-22-13    Knee    Family History  Problem Relation Age of Onset  . Kidney disease Mother   . Heart disease Mother   . Hypertension Mother   . Varicose Veins Mother   . Alcohol abuse Father   . Stroke Father     several   . Hypertension Father   . Varicose Veins Father   . Alcohol abuse Brother   . Hyperlipidemia Brother   . Hypertension Brother   . Early death Brother   . Varicose Veins Brother   . Varicose Veins Son   . Heart disease Brother   . Hypertension Brother   . Arthritis Brother     Gout    Allergies  Allergen Reactions  . Codeine Hives  . Aspirin     Noted caused 2 holes in retna, not sure   . Emetrol Rash    Current Outpatient  Prescriptions on File Prior to Visit  Medication Sig Dispense Refill  . hydrochlorothiazide (HYDRODIURIL) 25 MG tablet take 1 tablet by mouth once daily 30 tablet 2  . ibuprofen (ADVIL,MOTRIN) 100 MG tablet Take 100 mg by mouth every 6 (six) hours as needed for pain or fever.    . levothyroxine (SYNTHROID, LEVOTHROID) 50 MCG tablet take 1 tablet by mouth once daily 30 tablet 6  . loratadine (CLARITIN) 10 MG tablet Take 10 mg by mouth daily.    [name] omeprazole (PRILOSEC) 20 MG capsule take 1 capsule by mouth once daily (Patient taking differently: take 1 capsule by mouth as needed) 30 capsule 3  . Probiotic Product (PROBIOTIC DAILY PO) Take by mouth.    [name] ZETIA 10 MG tablet take 1 tablet by mouth once daily 30 tablet 5   No current facility-administered medications on file prior to visit.    BP 120/80 mmHg  Pulse 77  Temp(Src) 97.1 F (36.2 C) (Oral)  Ht 5\' 4"  (1.626 m)  Wt 220 lb (99.791 kg)  BMI 37.74 kg/m2  SpO2 98%       Objective:   Physical Exam   General  Mental Status - Alert. General Appearance - Well groomed. Not in acute distress.  Skin Rashes- No Rashes.  HEENT Head- Normal. Ear Auditory Canal - Left- Normal. Right - Normal.Tympanic Membrane- Left- Normal. Right- Normal. Eye Sclera/Conjunctiva- Left- Normal. Right- Normal. Nose & Sinuses Nasal Mucosa- Left-  Not boggy or Congested. Right-  Not  boggy or Congested. Mouth & Throat Lips: Upper Lip- Normal: no dryness, cracking, pallor, cyanosis, or vesicular eruption. Lower Lip-Normal: no dryness, cracking, pallor, cyanosis or vesicular eruption. Buccal Mucosa- Bilateral- No Aphthous ulcers. Oropharynx- No Discharge or Erythema. Tonsils: Characteristics- Bilateral- No Erythema or Congestion. Size/Enlargement- Bilateral- No enlargement. Discharge- bilateral-None.  Neck Neck- Supple. No Masses.   Chest and Lung Exam Auscultation: Breath Sounds:- even and unlabored, but bilateral upper lobe rhonchi with  wheezing  Cardiovascular Auscultation:Rythm- Regular, rate and rhythm. Murmurs & Other Heart Sounds:Ausculatation of the heart reveal- No Murmurs.  Lymphatic Head & Neck General Head & Neck Lymphatics: Bilateral: Description- No Localized lymphadenopathy.      Assessment & Plan:  Please continue levofloxin.   We gave you rocephin injection today.  For cough rx hycodan syrup (you have used hydrocodone before with no reaction).  For wheezing 3 day taper prednisone, qvar inhaler and albuterol inhaler.  Follow up Friday morning or as needed.

## 2015-01-23 NOTE — Telephone Encounter (Signed)
Patient scheduled with E. Saguier today at 3:30.

## 2015-01-23 NOTE — Telephone Encounter (Signed)
Relation to [id number] Call back [place]: [place], [place] [phone] (Phone) [phone] (Fax)         Reason for call:  Patient states she's expercicing reoccurring  coughing last seen 12/26/2014 patient requesting a Rx and patient scheduled a follow up for 01/25/15 but patient states cough is really bad.

## 2015-01-23 NOTE — Patient Instructions (Signed)
Please continue levofloxin.  We gave you rocephin injection today.  For cough rx hycodan syrup(you have used hydrocodone before with no reaction).  For wheezing 3 day taper prednisone, qvar inhaler and albuterol inhaler.  Follow up Friday morning or as needed.

## 2015-01-23 NOTE — Progress Notes (Signed)
Pre visit review using our clinic review tool, if applicable. No additional management support is needed unless otherwise documented below in the visit note. 

## 2015-01-24 ENCOUNTER — Telehealth: Payer: Self-pay | Admitting: *Deleted

## 2015-01-24 LAB — CBC WITH DIFFERENTIAL/PLATELET
Basophils Absolute: 0 10*3/uL (ref 0.0–0.1)
Basophils Relative: 0.2 % (ref 0.0–3.0)
Eosinophils Absolute: 0.1 10*3/uL (ref 0.0–0.7)
Eosinophils Relative: 0.4 % (ref 0.0–5.0)
HCT: 39.2 % (ref 36.0–46.0)
Hemoglobin: 13 g/dL (ref 12.0–15.0)
Lymphocytes Relative: 14.6 % (ref 12.0–46.0)
Lymphs Abs: 2.2 10*3/uL (ref 0.7–4.0)
MCHC: 33.1 g/dL (ref 30.0–36.0)
MCV: 88.6 fl (ref 78.0–100.0)
Monocytes Absolute: 0.7 10*3/uL (ref 0.1–1.0)
Monocytes Relative: 4.6 % (ref 3.0–12.0)
Neutro Abs: 12.1 10*3/uL — ABNORMAL HIGH (ref 1.4–7.7)
Neutrophils Relative %: 80.2 % — ABNORMAL HIGH (ref 43.0–77.0)
Platelets: 345 10*3/uL (ref 150.0–400.0)
RBC: 4.42 Mil/uL (ref 3.87–5.11)
RDW: 13.9 % (ref 11.5–15.5)
WBC: 15.1 10*3/uL — ABNORMAL HIGH (ref 4.0–10.5)

## 2015-01-24 NOTE — Telephone Encounter (Signed)
PA initiated through [email or web address], awaiting determination. JG//CMA 

## 2015-01-25 ENCOUNTER — Ambulatory Visit: Payer: Medicare Other | Admitting: Family Medicine

## 2015-01-25 MED ORDER — FLUTICASONE FUROATE 100 MCG/ACT IN AEPB: 2.0000 | INHALATION_SPRAY | Freq: Two times a day (BID) | RESPIRATORY_TRACT | Status: DC

## 2015-01-25 NOTE — Telephone Encounter (Signed)
Medication escribed to pharmacy. 

## 2015-01-25 NOTE — Addendum Note (Signed)
Addended by: Murtis Sink A on: 01/25/2015 10:33 AM   Modules accepted: Orders, Medications

## 2015-01-25 NOTE — Telephone Encounter (Signed)
Ok for [employer] HFA 110 mcg- 2 puffs twice daily

## 2015-01-25 NOTE — Telephone Encounter (Signed)
PA denied. Alternatives include Arnuity Ellipta. Please advise. JG//CMA

## 2015-01-27 ENCOUNTER — Ambulatory Visit (INDEPENDENT_AMBULATORY_CARE_PROVIDER_SITE_OTHER): Payer: Medicare Other | Admitting: Medical

## 2015-01-27 ENCOUNTER — Encounter: Payer: Self-pay | Admitting: Medical

## 2015-01-27 VITALS — BP 118/76 | HR 71 | Temp 97.4°F | Ht 64.0 in | Wt 220.0 lb

## 2015-01-27 DIAGNOSIS — R1013 Epigastric pain: Secondary | ICD-10-CM

## 2015-01-27 DIAGNOSIS — J209 Acute bronchitis, unspecified: Secondary | ICD-10-CM | POA: Diagnosis not present

## 2015-01-27 DIAGNOSIS — D72829 Elevated white blood cell count, unspecified: Secondary | ICD-10-CM

## 2015-01-27 DIAGNOSIS — R062 Wheezing: Secondary | ICD-10-CM | POA: Diagnosis not present

## 2015-01-27 DIAGNOSIS — M940 Chondrocostal junction syndrome [Tietze]: Secondary | ICD-10-CM

## 2015-01-27 LAB — CBC WITH DIFFERENTIAL/PLATELET
Basophils Absolute: 0 10*3/uL (ref 0.0–0.1)
Basophils Relative: 0 % (ref 0–1)
Eosinophils Absolute: 0.1 10*3/uL (ref 0.0–0.7)
Eosinophils Relative: 1 % (ref 0–5)
HCT: 40.2 % (ref 36.0–46.0)
Hemoglobin: 13.5 g/dL (ref 12.0–15.0)
Lymphocytes Relative: 19 % (ref 12–46)
Lymphs Abs: 2.8 10*3/uL (ref 0.7–4.0)
MCH: 29.5 pg (ref 26.0–34.0)
MCHC: 33.6 g/dL (ref 30.0–36.0)
MCV: 87.8 fL (ref 78.0–100.0)
MPV: 11.4 fL (ref 8.6–12.4)
Monocytes Absolute: 1.8 10*3/uL — ABNORMAL HIGH (ref 0.1–1.0)
Monocytes Relative: 12 % (ref 3–12)
Neutro Abs: 10 10*3/uL — ABNORMAL HIGH (ref 1.7–7.7)
Neutrophils Relative %: 68 % (ref 43–77)
Platelets: 349 10*3/uL (ref 150–400)
RBC: 4.58 MIL/uL (ref 3.87–5.11)
RDW: 14.2 % (ref 11.5–15.5)
WBC: 14.7 10*3/uL — ABNORMAL HIGH (ref 4.0–10.5)

## 2015-01-27 LAB — TROPONIN I: Troponin I: <0.01 ng/mL (ref <0.06)

## 2015-01-27 MED ORDER — CEFDINIR 300 MG PO CAPS: 300.0000 mg | ORAL_CAPSULE | Freq: Two times a day (BID) | ORAL | Status: DC

## 2015-01-27 MED ORDER — PREDNISONE 20 MG PO TABS: 20.0000 mg | ORAL_TABLET | Freq: Every day | ORAL | Status: DC

## 2015-01-27 MED ORDER — IPRATROPIUM BROMIDE HFA 17 MCG/ACT IN AERS: 2.0000 | INHALATION_SPRAY | Freq: Four times a day (QID) | RESPIRATORY_TRACT | Status: DC | PRN

## 2015-01-27 NOTE — Progress Notes (Signed)
Subjective:    Patient ID: Kristen Manning, female    DOB: 1932/07/01, 82 y.o.   MRN: [id number]  HPI  Pt states better with wheezing and cough. At least 50%. No longer has wheeze that can be heard across room. Pt has finished her levoquin, 3 day taper prednisone and has 2 inhalers. Using albuterol on one time a day. Subjective fever last night.  Pt has some epigastric and lower chest faint discomfort last night. She was burping. Took tums and peptombismol. Pain subside and not recurrent. No associated cardiac type signs or symptoms on review.      Review of Systems  Constitutional: Negative for fever, chills and fatigue.       Subjecive fever  Respiratory: Positive for cough and wheezing. Negative for chest tightness and shortness of breath.        Much better at least 50%.  Cardiovascular: Negative for chest pain and palpitations.       Gerd vs reflux, vs atypical chest pain. Favors costocohondritis and gerd. ekg lookded good  Gastrointestinal: Negative for nausea, vomiting, abdominal pain, diarrhea, constipation and blood in stool.       Gerd last night.  Neurological: Negative for dizziness and headaches.  Hematological: Negative for adenopathy. Does not bruise/bleed easily.    Past Medical History  Diagnosis Date  . Arthritis   . Hyperlipidemia     takes Zetia daily  . Urinary incontinence   . Cancer ([place])     skin cancer, back, legs melonama  . GERD (gastroesophageal reflux disease)     takes Omeprazole daily  . Hypertension     takes HCTZ daily  . PONV (postoperative nausea and vomiting)   . Myocardial infarction [facility])     pt states EKG always shows infarct but no change from previous ekg;never knew anything about it  . Headache(784.0)   . Bacterial infection     in lungs in Jan 2015  . Shortness of breath     with exertion  . Pneumonia     hx of-many yrs ago  . History of migraine     last one about 15 yrs ago  . Osteoarthritis   . Joint swelling   .  Joint pain   . Chronic back pain   . Urinary frequency   . Urinary urgency   . Nocturia     Social History   Social History  . Marital Status: Widowed    Spouse Name: N/A  . Number of Children: N/A  . Years of Education: N/A   Occupational History  . Not on file.   Social History Main Topics  . Smoking status: Never Smoker   . Smokeless tobacco: Never Used  . Alcohol Use: No  . Drug Use: No  . Sexual Activity: Not on file   Other Topics Concern  . Not on file   Social History Narrative    Past Surgical History  Procedure Laterality Date  . Knee surgery Bilateral 2009 and 2011  . Bladder tack  1998  . Tonsillectomy  as a child ? date  . Oophorectomy  1998    only one  . Blepharoplasty Bilateral   . Total knee arthroplasty Right 02/22/2013    DR LUCEY  . Total knee arthroplasty Right 02/22/2013    Procedure: TOTAL KNEE ARTHROPLASTY;  Surgeon: Vickey Huger, MD;  Location: [place];  Service: Orthopedics;  Laterality: Right;  . Joint replacement Left 10/02/2009  . Joint replacement Right 02-22-13  Knee    Family History  Problem Relation Age of Onset  . Kidney disease Mother   . Heart disease Mother   . Hypertension Mother   . Varicose Veins Mother   . Alcohol abuse Father   . Stroke Father     several   . Hypertension Father   . Varicose Veins Father   . Alcohol abuse Brother   . Hyperlipidemia Brother   . Hypertension Brother   . Early death Brother   . Varicose Veins Brother   . Varicose Veins Son   . Heart disease Brother   . Hypertension Brother   . Arthritis Brother     Gout    Allergies  Allergen Reactions  . Codeine Hives  . Aspirin     Noted caused 2 holes in retna, not sure   . Emetrol Rash    Current Outpatient Prescriptions on File Prior to Visit  Medication Sig Dispense Refill  . albuterol (PROVENTIL HFA;VENTOLIN HFA) 108 (90 Base) MCG/ACT inhaler Inhale 2 puffs into the lungs every 6 (six) hours as needed for wheezing or shortness  of breath. 1 Inhaler 0  . Fluticasone Furoate (ARNUITY ELLIPTA) 100 MCG/ACT AEPB Inhale 2 puffs into the lungs 2 (two) times daily. 30 each 5  . hydrochlorothiazide (HYDRODIURIL) 25 MG tablet take 1 tablet by mouth once daily 30 tablet 2  . HYDROcodone-homatropine (HYCODAN) 5-1.5 MG/5ML syrup Take 5 mLs by mouth every 8 (eight) hours as needed for cough. 120 mL 0  . ibuprofen (ADVIL,MOTRIN) 100 MG tablet Take 100 mg by mouth every 6 (six) hours as needed for pain or fever.    . levothyroxine (SYNTHROID, LEVOTHROID) 50 MCG tablet take 1 tablet by mouth once daily 30 tablet 6  . loratadine (CLARITIN) 10 MG tablet Take 10 mg by mouth daily.    [name] omeprazole (PRILOSEC) 20 MG capsule take 1 capsule by mouth once daily (Patient taking differently: take 1 capsule by mouth as needed) 30 capsule 3  . Probiotic Product (PROBIOTIC DAILY PO) Take by mouth.    [name] ZETIA 10 MG tablet take 1 tablet by mouth once daily 30 tablet 5   No current facility-administered medications on file prior to visit.    BP 118/76 mmHg  Pulse 71  Temp(Src) 97.4 F (36.3 C) (Oral)  Ht 5\' 4"  (1.626 m)  Wt 220 lb (99.791 kg)  BMI 37.74 kg/m2  SpO2 98%       Objective:   Physical Exam  General  Mental Status - Alert. General Appearance - Well groomed. Not in acute distress.  Skin Rashes- No Rashes.    Neck Neck- Supple. No Masses.  Anterior thorax- costochondrol junction tenderness to palpation   Chest and Lung Exam Auscultation: Breath Sounds:- even and unlabored. No wheezing.  Cardiovascular Auscultation:Rythm- Regular, rate and rhythm. Murmurs & Other Heart Sounds:Ausculatation of the heart reveal- No Murmurs.  Lymphatic Head & Neck General Head & Neck Lymphatics: Bilateral: Description- No Localized lymphadenopathy.   Abdomen Inspection:-Inspection Normal.  Palpation/Perucssion: Palpation and Percussion of the abdomen reveal- Non Tender, No Rebound tenderness, No rigidity(Guarding) and No  Palpable abdominal masses.  Liver:-Normal.  Spleen:- Normal.           Assessment & Plan:  nsr ekg. No ischemia  Your bronchitis and wheezing symptoms much improved. Since you still have some residual symptoms and felt subjective fever last night so will give for  7days of cefdnir and ext 3 days low dose prednisone. Continue qvar. If  you are wheezing again over weekend then use atrovent. If use atrovent stop albuterol  For costochondritis can use low dose ibuprofen  Will respeat labs to check infection fighting cells.  Ekg for your abdomen and chest dicomfort. Also get troponin study.   If you get any recurrent or more severe chest discomfort then Emergency department evaluation.  Follow up in 7 days or as needed

## 2015-01-27 NOTE — Progress Notes (Signed)
Pre visit review using our clinic review tool, if applicable. No additional management support is needed unless otherwise documented below in the visit note. 

## 2015-01-27 NOTE — Patient Instructions (Addendum)
Your bronchitis and wheezing symptoms much improved. Since you still have some residual symptoms and felt subjective fever last night so will give for  7days of cefdnir and ext 3 days low dose prednisone. Continue qvar. If you are wheezing again over weekend then use atrovent. If use atrovent stop albuterol  For costochondritis can use low dose ibuprofen  Will respeat labs to check infection fighting cells.  Ekg for your abdomen and chest dicomfort. Also get troponin study.   If you get any recurrent or more severe chest discomfort then Emergency department evaluation.  Follow up in 7 days or as needed

## 2015-02-03 ENCOUNTER — Ambulatory Visit: Payer: Medicare Other | Admitting: Family Medicine

## 2015-02-10 DIAGNOSIS — Z85820 Personal history of malignant melanoma of skin: Secondary | ICD-10-CM | POA: Diagnosis not present

## 2015-02-10 DIAGNOSIS — C44519 Basal cell carcinoma of skin of other part of trunk: Secondary | ICD-10-CM | POA: Diagnosis not present

## 2015-02-10 DIAGNOSIS — L821 Other seborrheic keratosis: Secondary | ICD-10-CM | POA: Diagnosis not present

## 2015-02-10 DIAGNOSIS — C44719 Basal cell carcinoma of skin of left lower limb, including hip: Secondary | ICD-10-CM | POA: Diagnosis not present

## 2015-02-10 DIAGNOSIS — L814 Other melanin hyperpigmentation: Secondary | ICD-10-CM | POA: Diagnosis not present

## 2015-02-10 DIAGNOSIS — D2262 Melanocytic nevi of left upper limb, including shoulder: Secondary | ICD-10-CM | POA: Diagnosis not present

## 2015-02-10 DIAGNOSIS — Z85828 Personal history of other malignant neoplasm of skin: Secondary | ICD-10-CM | POA: Diagnosis not present

## 2015-03-05 ENCOUNTER — Other Ambulatory Visit: Payer: Self-pay | Admitting: Family Medicine

## 2015-03-06 NOTE — Telephone Encounter (Signed)
Medication filled to pharmacy as requested.   

## 2015-03-14 DIAGNOSIS — M7989 Other specified soft tissue disorders: Secondary | ICD-10-CM | POA: Diagnosis not present

## 2015-03-14 DIAGNOSIS — I890 Lymphedema, not elsewhere classified: Secondary | ICD-10-CM | POA: Diagnosis not present

## 2015-03-14 DIAGNOSIS — M25561 Pain in right knee: Secondary | ICD-10-CM | POA: Diagnosis not present

## 2015-03-14 DIAGNOSIS — L603 Nail dystrophy: Secondary | ICD-10-CM | POA: Diagnosis not present

## 2015-03-14 DIAGNOSIS — M792 Neuralgia and neuritis, unspecified: Secondary | ICD-10-CM | POA: Diagnosis not present

## 2015-03-14 DIAGNOSIS — I739 Peripheral vascular disease, unspecified: Secondary | ICD-10-CM | POA: Diagnosis not present

## 2015-03-14 DIAGNOSIS — M25562 Pain in left knee: Secondary | ICD-10-CM | POA: Diagnosis not present

## 2015-03-20 DIAGNOSIS — Z96653 Presence of artificial knee joint, bilateral: Secondary | ICD-10-CM | POA: Insufficient documentation

## 2015-03-20 DIAGNOSIS — Z96651 Presence of right artificial knee joint: Secondary | ICD-10-CM | POA: Diagnosis not present

## 2015-03-20 DIAGNOSIS — Z96652 Presence of left artificial knee joint: Secondary | ICD-10-CM | POA: Diagnosis not present

## 2015-03-20 DIAGNOSIS — Z471 Aftercare following joint replacement surgery: Secondary | ICD-10-CM | POA: Diagnosis not present

## 2015-03-21 ENCOUNTER — Encounter: Payer: Self-pay | Admitting: *Deleted

## 2015-03-21 ENCOUNTER — Telehealth: Payer: Self-pay | Admitting: *Deleted

## 2015-03-21 NOTE — Telephone Encounter (Signed)
Pre-Visit Call completed with patient and chart updated.   Pre-Visit Info documented in Specialty Comments under [employer].    

## 2015-03-22 ENCOUNTER — Encounter: Payer: Self-pay | Admitting: Family Medicine

## 2015-03-22 ENCOUNTER — Ambulatory Visit (INDEPENDENT_AMBULATORY_CARE_PROVIDER_SITE_OTHER): Payer: Medicare Other | Admitting: Family Medicine

## 2015-03-22 VITALS — BP 140/82 | HR 82 | Temp 97.9°F | Resp 16 | Ht 64.0 in | Wt 229.0 lb

## 2015-03-22 DIAGNOSIS — R0602 Shortness of breath: Secondary | ICD-10-CM | POA: Insufficient documentation

## 2015-03-22 DIAGNOSIS — R600 Localized edema: Secondary | ICD-10-CM | POA: Diagnosis not present

## 2015-03-22 LAB — HEPATIC FUNCTION PANEL
ALT: 16 U/L (ref 0–35)
AST: 21 U/L (ref 0–37)
Albumin: 3.8 g/dL (ref 3.5–5.2)
Alkaline Phosphatase: 98 U/L (ref 39–117)
Bilirubin, Direct: 0.1 mg/dL (ref 0.0–0.3)
Total Bilirubin: 0.6 mg/dL (ref 0.2–1.2)
Total Protein: 7.3 g/dL (ref 6.0–8.3)

## 2015-03-22 LAB — BASIC METABOLIC PANEL
BUN: 21 mg/dL (ref 6–23)
CO2: 27 mEq/L (ref 19–32)
Calcium: 9.5 mg/dL (ref 8.4–10.5)
Chloride: 104 mEq/L (ref 96–112)
Creatinine, Ser: 1.07 mg/dL (ref 0.40–1.20)
GFR: 52.38 mL/min — ABNORMAL LOW (ref >60.00)
Glucose, Bld: 98 mg/dL (ref 70–99)
Potassium: 3.9 mEq/L (ref 3.5–5.1)
Sodium: 139 mEq/L (ref 135–145)

## 2015-03-22 LAB — CBC WITH DIFFERENTIAL/PLATELET
Basophils Absolute: 0 10*3/uL (ref 0.0–0.1)
Basophils Relative: 0.3 % (ref 0.0–3.0)
Eosinophils Absolute: 0.1 10*3/uL (ref 0.0–0.7)
Eosinophils Relative: 1.5 % (ref 0.0–5.0)
HCT: 37.2 % (ref 36.0–46.0)
Hemoglobin: 12.5 g/dL (ref 12.0–15.0)
Lymphocytes Relative: 18.3 % (ref 12.0–46.0)
Lymphs Abs: 1.6 10*3/uL (ref 0.7–4.0)
MCHC: 33.5 g/dL (ref 30.0–36.0)
MCV: 87.7 fl (ref 78.0–100.0)
Monocytes Absolute: 0.7 10*3/uL (ref 0.1–1.0)
Monocytes Relative: 7.5 % (ref 3.0–12.0)
Neutro Abs: 6.3 10*3/uL (ref 1.4–7.7)
Neutrophils Relative %: 72.4 % (ref 43.0–77.0)
Platelets: 323 10*3/uL (ref 150.0–400.0)
RBC: 4.24 Mil/uL (ref 3.87–5.11)
RDW: 13.6 % (ref 11.5–15.5)
WBC: 8.7 10*3/uL (ref 4.0–10.5)

## 2015-03-22 LAB — TSH: TSH: 2.41 u[IU]/mL (ref 0.35–4.50)

## 2015-03-22 LAB — LIPID PANEL
Cholesterol: 193 mg/dL (ref 0–200)
HDL: 55.1 mg/dL (ref >39.00)
LDL Cholesterol: 106 mg/dL — ABNORMAL HIGH (ref 0–99)
NonHDL: 137.4
Total CHOL/HDL Ratio: 3
Triglycerides: 159 mg/dL — ABNORMAL HIGH (ref 0.0–149.0)
VLDL: 31.8 mg/dL (ref 0.0–40.0)

## 2015-03-22 LAB — BRAIN NATRIURETIC PEPTIDE: Pro B Natriuretic peptide (BNP): 59 pg/mL (ref 0.0–100.0)

## 2015-03-22 MED ORDER — FUROSEMIDE 40 MG PO TABS: 40.0000 mg | ORAL_TABLET | Freq: Every day | ORAL | Status: DC

## 2015-03-22 NOTE — Progress Notes (Signed)
Pre visit review using our clinic review tool, if applicable. No additional management support is needed unless otherwise documented below in the visit note. 

## 2015-03-22 NOTE — Progress Notes (Signed)
   Subjective:    Patient ID: Kristen Manning, female    DOB: [birth year], 82 y.o.   MRN: [id number]  HPI Edema- pt has seen both ortho and podiatry and was told that swelling was not knee related and podiatry (Dr Elby Showers) told her it was not a DVT based on [place] results.  Swelling is bilateral.  Pt is now having SOB w/ ambulation.  Minimal cough.  Pt doesn't lie flat- and sleeps 'almost sitting up' but this is not a new thing.  She reports she is able to lie down just prefers not to.  No dietary changes.  Pt has hx of venous insufficiency.  Pt is using home SCDs twice daily up until the swelling has worsened over the last week.  Pt completed course of Keflex today that was provided by Podiatry for possible cellulitis.   Review of Systems For ROS see HPI     Objective:   Physical Exam  Constitutional: She is oriented to person, place, and time. She appears well-developed and well-nourished. No distress.  HENT:  Head: Normocephalic and atraumatic.  Eyes: Conjunctivae and EOM are normal. Pupils are equal, round, and reactive to light.  Neck: Normal range of motion. Neck supple. No thyromegaly present.  Cardiovascular: Normal rate, regular rhythm, normal heart sounds and intact distal pulses.   No murmur heard. Pulmonary/Chest: Effort normal and breath sounds normal. No respiratory distress. She has no wheezes.  Abdominal: Soft. She exhibits no distension. There is no tenderness.  Musculoskeletal: She exhibits edema (tense, bilateral LE edema to just below knee.  no pitting, + redness but no warmth) and tenderness (bilateral LEs TTP due to edema).  Lymphadenopathy:    She has no cervical adenopathy.  Neurological: She is alert and oriented to person, place, and time.  Skin: Skin is warm and dry.  Psychiatric: She has a normal mood and affect. Her behavior is normal.  Vitals reviewed.         Assessment & Plan:

## 2015-03-22 NOTE — Patient Instructions (Signed)
Follow up in 2 weeks to recheck swelling and lab work [employer] notify you of your lab results today and make any changes if needed Drink plenty of fluids and limit your salt intake Elevate your legs as you are able Start the Lasix once daily (in the morning) Continue all other meds at this time We'll call you with your ECHO appt (ultrasound of the heart) Call with any questions or concerns If you want to join [place] at the new [place] office, any scheduled appointments will automatically transfer and we will see you at [address] [place] [address] [place], [place] [address] ([place] 3/23) [place] in there!!!

## 2015-03-24 ENCOUNTER — Telehealth: Payer: Self-pay | Admitting: Family Medicine

## 2015-03-24 NOTE — Telephone Encounter (Signed)
Will check w/ our referral coordinator

## 2015-03-24 NOTE — Telephone Encounter (Signed)
Pt is scheduled for 04/12/15 @2 :15, pt aware

## 2015-03-24 NOTE — Telephone Encounter (Signed)
Pt called in stating she was told we were setting up an [place] on her heart and she hasn't heard from anyone for appt. I didn't see an order or referral. Please call pt back at [phone].

## 2015-03-24 NOTE — Telephone Encounter (Signed)
Dr. Birdie Riddle placed an order for an ECHO. Have we heard anything about this being scheduled?

## 2015-03-28 ENCOUNTER — Other Ambulatory Visit: Payer: Self-pay

## 2015-03-28 DIAGNOSIS — Z1231 Encounter for screening mammogram for malignant neoplasm of breast: Secondary | ICD-10-CM

## 2015-03-28 DIAGNOSIS — I890 Lymphedema, not elsewhere classified: Secondary | ICD-10-CM | POA: Diagnosis not present

## 2015-04-03 ENCOUNTER — Telehealth: Payer: Self-pay | Admitting: Family Medicine

## 2015-04-03 ENCOUNTER — Ambulatory Visit (INDEPENDENT_AMBULATORY_CARE_PROVIDER_SITE_OTHER): Payer: Medicare Other

## 2015-04-03 VITALS — BP 146/60 | HR 73 | Ht 64.0 in | Wt 226.0 lb

## 2015-04-03 DIAGNOSIS — Z0000 Encounter for general adult medical examination without abnormal findings: Secondary | ICD-10-CM | POA: Diagnosis not present

## 2015-04-03 NOTE — Patient Instructions (Addendum)
Bring a copy of Advanced Directives/Living Will to next visit.   Continue doing brain stimulating activities (puzzles, reading, adult coloring books, staying active) to keep memory sharp.   Eat heart healthy diet (full of fruits, vegetables, whole grains, lean protein, water--limit salt, fat, and sugar intake) and increase physical activity as tolerated.  Look into [employer] and find a gym or fitness center in which to start fitness routine.  Follow up with Dr. Birdie Riddle as scheduled.  She is currently at her new office in [place].  [employer] directions given.    Fat and Cholesterol Restricted Diet High levels of fat and cholesterol in your blood may lead to various health problems, such as diseases of the heart, blood vessels, gallbladder, liver, and pancreas. Fats are concentrated sources of energy that come in various forms. Certain types of fat, including saturated fat, may be harmful in excess. Cholesterol is a substance needed by your body in small amounts. Your body makes all the cholesterol it needs. Excess cholesterol comes from the food you eat. When you have high levels of cholesterol and saturated fat in your blood, health problems can develop because the excess fat and cholesterol will gather along the walls of your blood vessels, causing them to narrow. Choosing the right foods will help you control your intake of fat and cholesterol. This will help keep the levels of these substances in your blood within normal limits and reduce your risk of disease. WHAT IS MY PLAN? Your health care provider recommends that you:  Get no more than __________ % of the total calories in your daily diet from fat.  Limit your intake of saturated fat to less than ______% of your total calories each day.  Limit the amount of cholesterol in your diet to less than _________mg per day. WHAT TYPES OF FAT SHOULD I CHOOSE?  Choose healthy fats more often. Choose monounsaturated and polyunsaturated  fats, such as olive and canola oil, flaxseeds, walnuts, almonds, and seeds.  Eat more omega-3 fats. Good choices include salmon, mackerel, sardines, tuna, flaxseed oil, and ground flaxseeds. Aim to eat fish at least two times a week.  Limit saturated fats. Saturated fats are primarily found in animal products, such as meats, butter, and cream. Plant sources of saturated fats include palm oil, palm kernel oil, and coconut oil.  Avoid foods with partially hydrogenated oils in them. These contain trans fats. Examples of foods that contain trans fats are stick margarine, some tub margarines, cookies, crackers, and other baked goods. WHAT GENERAL GUIDELINES DO I NEED TO FOLLOW? These guidelines for healthy eating will help you control your intake of fat and cholesterol:  Check food labels carefully to identify foods with trans fats or high amounts of saturated fat.  Fill one half of your plate with vegetables and green salads.  Fill one fourth of your plate with whole grains. Look for the word "whole" as the first word in the ingredient list.  Fill one fourth of your plate with lean protein foods.  Limit fruit to two servings a day. Choose fruit instead of juice.  Eat more foods that contain soluble fiber. Examples of foods that contain this type of fiber are apples, broccoli, carrots, beans, peas, and barley. Aim to get 20-30 g of fiber per day.  Eat more home-cooked food and less restaurant, buffet, and fast food.  Limit or avoid alcohol.  Limit foods high in starch and sugar.  Limit fried foods.  Cook foods using methods other than  frying. Baking, boiling, grilling, and broiling are all great options.  Lose weight if you are overweight. Losing just 5-10% of your initial body weight can help your overall health and prevent diseases such as diabetes and heart disease. WHAT FOODS CAN I EAT? Grains Whole grains, such as whole wheat or whole grain breads, crackers, cereals, and pasta.  Unsweetened oatmeal, bulgur, barley, quinoa, or brown rice. Corn or whole wheat flour tortillas. Vegetables Fresh or frozen vegetables (raw, steamed, roasted, or grilled). Green salads. Fruits All fresh, canned (in natural juice), or frozen fruits. Meat and Other Protein Products Ground beef (85% or leaner), grass-fed beef, or beef trimmed of fat. Skinless chicken or [place]. Ground chicken or [place]. Pork trimmed of fat. All fish and seafood. Eggs. Dried beans, peas, or lentils. Unsalted nuts or seeds. Unsalted canned or dry beans. Dairy Low-fat dairy products, such as skim or 1% milk, 2% or reduced-fat cheeses, low-fat ricotta or cottage cheese, or plain low-fat yogurt. Fats and Oils Tub margarines without trans fats. Light or reduced-fat mayonnaise and salad dressings. Avocado. Olive, canola, sesame, or safflower oils. Natural peanut or almond butter (choose ones without added sugar and oil). The items listed above may not be a complete list of recommended foods or beverages. Contact your dietitian for more options. WHAT FOODS ARE NOT RECOMMENDED? Grains White bread. White pasta. White rice. Cornbread. Bagels, pastries, and croissants. Crackers that contain trans fat. Vegetables White potatoes. Corn. Creamed or fried vegetables. Vegetables in a cheese sauce. Fruits Dried fruits. Canned fruit in light or heavy syrup. Fruit juice. Meat and Other Protein Products Fatty cuts of meat. Ribs, chicken wings, bacon, sausage, bologna, salami, chitterlings, fatback, hot dogs, bratwurst, and packaged luncheon meats. Liver and organ meats. Dairy Whole or 2% milk, cream, half-and-half, and cream cheese. Whole milk cheeses. Whole-fat or sweetened yogurt. Full-fat cheeses. Nondairy creamers and whipped toppings. Processed cheese, cheese spreads, or cheese curds. Sweets and Desserts Corn syrup, sugars, honey, and molasses. Candy. Jam and jelly. Syrup. Sweetened cereals. Cookies, pies, cakes, donuts,  muffins, and ice cream. Fats and Oils Butter, stick margarine, lard, shortening, ghee, or bacon fat. Coconut, palm kernel, or palm oils. Beverages Alcohol. Sweetened drinks (such as sodas, lemonade, and fruit drinks or punches). The items listed above may not be a complete list of foods and beverages to avoid. Contact your dietitian for more information.   This information is not intended to replace advice given to you by your health care provider. Make sure you discuss any questions you have with your health care provider.   Document Released: 12/24/2004 Document Revised: 01/14/2014 Document Reviewed: 03/24/2013 Elsevier Interactive Patient Education 2016 [employer] Inc[employer]  Fat and Cholesterol Restricted Diet Getting too much fat and cholesterol in your diet may cause health problems. Following this diet helps keep your fat and cholesterol at normal levels. This can keep you from getting sick. WHAT TYPES OF FAT SHOULD I CHOOSE?  Choose monosaturated and polyunsaturated fats. These are found in foods such as olive oil, canola oil, flaxseeds, walnuts, almonds, and seeds.  Eat more omega-3 fats. Good choices include salmon, mackerel, sardines, tuna, flaxseed oil, and ground flaxseeds.  Limit saturated fats. These are in animal products such as meats, butter, and cream. They can also be in plant products such as palm oil, palm kernel oil, and coconut oil.   Avoid foods with partially hydrogenated oils in them. These contain trans fats. Examples of foods that have trans fats are stick margarine, some tub margarines, cookies,  crackers, and other baked goods. WHAT GENERAL GUIDELINES DO I NEED TO FOLLOW?   Check food labels. Look for the words "trans fat" and "saturated fat."  When preparing a meal:  Fill half of your plate with vegetables and green salads.  Fill one fourth of your plate with whole grains. Look for the word "whole" as the first word in the ingredient list.  Fill one fourth of  your plate with lean protein foods.  Limit fruit to two servings a day. Choose fruit instead of juice.  Eat more foods with soluble fiber. Examples of foods with this type of fiber are apples, broccoli, carrots, beans, peas, and barley. Try to get 20-30 g (grams) of fiber per day.  Eat more home-cooked foods. Eat less at restaurants and buffets.  Limit or avoid alcohol.  Limit foods high in starch and sugar.  Limit fried foods.  Cook foods without frying them. Baking, boiling, grilling, and broiling are all great options.  Lose weight if you are overweight. Losing even a small amount of weight can help your overall health. It can also help prevent diseases such as diabetes and heart disease. WHAT FOODS CAN I EAT? Grains Whole grains, such as whole wheat or whole grain breads, crackers, cereals, and pasta. Unsweetened oatmeal, bulgur, barley, quinoa, or brown rice. Corn or whole wheat flour tortillas. Vegetables Fresh or frozen vegetables (raw, steamed, roasted, or grilled). Green salads. Fruits All fresh, canned (in natural juice), or frozen fruits. Meat and Other Protein Products Ground beef (85% or leaner), grass-fed beef, or beef trimmed of fat. Skinless chicken or [place]. Ground chicken or [place]. Pork trimmed of fat. All fish and seafood. Eggs. Dried beans, peas, or lentils. Unsalted nuts or seeds. Unsalted canned or dry beans. Dairy Low-fat dairy products, such as skim or 1% milk, 2% or reduced-fat cheeses, low-fat ricotta or cottage cheese, or plain low-fat yogurt. Fats and Oils Tub margarines without trans fats. Light or reduced-fat mayonnaise and salad dressings. Avocado. Olive, canola, sesame, or safflower oils. Natural peanut or almond butter (choose ones without added sugar and oil). The items listed above may not be a complete list of recommended foods or beverages. Contact your dietitian for more options. WHAT FOODS ARE NOT RECOMMENDED? Grains White bread. White pasta.  White rice. Cornbread. Bagels, pastries, and croissants. Crackers that contain trans fat. Vegetables White potatoes. Corn. Creamed or fried vegetables. Vegetables in a cheese sauce. Fruits Dried fruits. Canned fruit in light or heavy syrup. Fruit juice. Meat and Other Protein Products Fatty cuts of meat. Ribs, chicken wings, bacon, sausage, bologna, salami, chitterlings, fatback, hot dogs, bratwurst, and packaged luncheon meats. Liver and organ meats. Dairy Whole or 2% milk, cream, half-and-half, and cream cheese. Whole milk cheeses. Whole-fat or sweetened yogurt. Full-fat cheeses. Nondairy creamers and whipped toppings. Processed cheese, cheese spreads, or cheese curds. Sweets and Desserts Corn syrup, sugars, honey, and molasses. Candy. Jam and jelly. Syrup. Sweetened cereals. Cookies, pies, cakes, donuts, muffins, and ice cream. Fats and Oils Butter, stick margarine, lard, shortening, ghee, or bacon fat. Coconut, palm kernel, or palm oils. Beverages Alcohol. Sweetened drinks (such as sodas, lemonade, and fruit drinks or punches). The items listed above may not be a complete list of foods and beverages to avoid. Contact your dietitian for more information.   This information is not intended to replace advice given to you by your health care provider. Make sure you discuss any questions you have with your health care provider.   Document Released:  06/25/2011 Document Revised: 01/14/2014 Document Reviewed: 03/25/2013 Elsevier Interactive Patient Education 2016 [place] in the Home  Falls can cause injuries. They can happen to people of all ages. There are many things you can do to make your home safe and to help prevent falls.  WHAT CAN I DO ON THE OUTSIDE OF MY HOME?  Regularly fix the edges of walkways and driveways and fix any cracks.  Remove anything that might make you trip as you walk through a door, such as a raised step or threshold.  Trim any bushes or  trees on the path to your home.  Use bright outdoor lighting.  Clear any walking paths of anything that might make someone trip, such as rocks or tools.  Regularly check to see if handrails are loose or broken. Make sure that both sides of any steps have handrails.  Any raised decks and porches should have guardrails on the edges.  Have any leaves, snow, or ice cleared regularly.  Use sand or salt on walking paths during winter.  Clean up any spills in your garage right away. This includes oil or grease spills. WHAT CAN I DO IN THE BATHROOM?   Use night lights.  Install grab bars by the toilet and in the tub and shower. Do not use towel bars as grab bars.  Use non-skid mats or decals in the tub or shower.  If you need to sit down in the shower, use a plastic, non-slip stool.  Keep the floor dry. Clean up any water that spills on the floor as soon as it happens.  Remove soap buildup in the tub or shower regularly.  Attach bath mats securely with double-sided non-slip rug tape.  Do not have throw rugs and other things on the floor that can make you trip. WHAT CAN I DO IN THE BEDROOM?  Use night lights.  Make sure that you have a light by your bed that is easy to reach.  Do not use any sheets or blankets that are too big for your bed. They should not hang down onto the floor.  Have a firm chair that has side arms. You can use this for support while you get dressed.  Do not have throw rugs and other things on the floor that can make you trip. WHAT CAN I DO IN THE KITCHEN?  Clean up any spills right away.  Avoid walking on wet floors.  Keep items that you use a lot in easy-to-reach places.  If you need to reach something above you, use a strong step stool that has a grab bar.  Keep electrical cords out of the way.  Do not use floor polish or wax that makes floors slippery. If you must use wax, use non-skid floor wax.  Do not have throw rugs and other things on the  floor that can make you trip. WHAT CAN I DO WITH MY STAIRS?  Do not leave any items on the stairs.  Make sure that there are handrails on both sides of the stairs and use them. Fix handrails that are broken or loose. Make sure that handrails are as long as the stairways.  Check any carpeting to make sure that it is firmly attached to the stairs. Fix any carpet that is loose or worn.  Avoid having throw rugs at the top or bottom of the stairs. If you do have throw rugs, attach them to the floor with carpet tape.  Make sure that you  have a light switch at the top of the stairs and the bottom of the stairs. If you do not have them, ask someone to add them for you. WHAT ELSE CAN I DO TO HELP PREVENT FALLS?  Wear shoes that:  Do not have high heels.  Have rubber bottoms.  Are comfortable and fit you well.  Are closed at the toe. Do not wear sandals.  If you use a stepladder:  Make sure that it is fully opened. Do not climb a closed stepladder.  Make sure that both sides of the stepladder are locked into place.  Ask someone to hold it for you, if possible.  Clearly mark and make sure that you can see:  Any grab bars or handrails.  First and last steps.  Where the edge of each step is.  Use tools that help you move around (mobility aids) if they are needed. These include:  Canes.  Walkers.  Scooters.  Crutches.  Turn on the lights when you go into a dark area. Replace any light bulbs as soon as they burn out.  Set up your furniture so you have a clear path. Avoid moving your furniture around.  If any of your floors are uneven, fix them.  If there are any pets around you, be aware of where they are.  Review your medicines with your doctor. Some medicines can make you feel dizzy. This can increase your chance of falling. Ask your doctor what other things that you can do to help prevent falls.   This information is not intended to replace advice given to you by your  health care provider. Make sure you discuss any questions you have with your health care provider.   Document Released: 10/20/2008 Document Revised: 05/10/2014 Document Reviewed: 01/28/2014 Elsevier Interactive Patient Education 2016 [place] A mammogram is an X-ray of the breasts that is done to check for changes that are not normal. This test can screen for and find any changes that may suggest breast cancer. This test can also help to find other changes and variations in the breast. BEFORE THE PROCEDURE  Have this test done about 1-2 weeks after your period. This is usually when your breasts are the least tender.  If you are visiting a new doctor or clinic, send any past mammogram images to your new doctor's office.  Wash your breasts and under your arms the day of the test.  Do not use deodorants, perfumes, lotions, or powders on the day of the test.  Take off any jewelry from your neck.  Wear clothes that you can change into and out of easily. PROCEDURE  You will undress from the waist up. You will put on a gown.  You will stand in front of the X-ray machine.  Each breast will be placed between two plastic or glass plates. The plates will press down on your breast for a few seconds. Try to stay as relaxed as possible. This does not cause any harm to your breasts. Any discomfort you feel will be very brief.  X-rays will be taken from different angles of each breast. The procedure may vary among doctors and hospitals. AFTER THE PROCEDURE  The mammogram will be looked at by a specialist (radiologist).  You may need to do certain parts of the test again. This depends on the quality of the images.  Ask when your test results will be ready. Make sure you get your test results.  You may go back  to your normal activities.   This information is not intended to replace advice given to you by your health care provider. Make sure you discuss any questions you have  with your health care provider.   Document Released: 03/18/2011 Document Revised: 09/14/2014 Document Reviewed: 03/04/2014 Elsevier Interactive Patient Education [employer].

## 2015-04-03 NOTE — Progress Notes (Signed)
Pre visit review using our clinic review tool, if applicable. No additional management support is needed unless otherwise documented below in the visit note. 

## 2015-04-03 NOTE — Progress Notes (Signed)
   Subjective:    Patient ID: Kristen Manning, female    DOB: [birth year], 82 y.o.   MRN: [id number]  HPI Agree w/ documentation by RN   Review of Systems To be done at time of CPE    Objective:   Physical Exam To be done at time of CPE       Assessment & Plan:  Follow up as scheduled for CPE

## 2015-04-03 NOTE — Telephone Encounter (Signed)
Noted and chart updated. Thanks.

## 2015-04-03 NOTE — Progress Notes (Signed)
Subjective:   Kristen Manning is a 82 y.o. female who presents for Medicare Annual (Subsequent) preventive examination.  Review of Systems: No ROS Cardiac Risk Factors include: advanced age (>81men, >80 women);hypertension;obesity (BMI >30kg/m2) (Hyperlipidemia) Sleep patterns: Sleeps at least 6-7 hours per night/wakes up at 3-4 times at night to void.   Home Safety/Smoke Alarms: Feels safe at home.  Lives with brother in one level home.  No steps going into home.  Has trouble stepping over side of tub.  Uses stand up shower in brother room instead.  Smoke alarms present.   Firearm Safety:  Kept in safe place.   Seat Belt Safety/Bike Helmet:  Always wears seat belt.    Counseling:   Eye Exam- Plans to schedule an appt.   Dental- Goes every 6 months.   Female:  Pap-Hysterectomy      Mammo-05/10/14-negative; next MMG scheduled      Dexa scan-04/27/14-osteopenia      CCS-never had one.   Sun Exposure:  Wears sunscreen and protective clothing.  Seen Dr. Elvera Lennox 3 months ago.    Objective:     Vitals: BP 146/60 mmHg  Pulse 73  Ht 5\' 4"  (1.626 m)  Wt 226 lb (102.513 kg)  BMI 38.77 kg/m2  SpO2 98%  Body mass index is 38.77 kg/(m^2).   Tobacco History  Smoking status  . Never Smoker   Smokeless tobacco  . Never Used     Counseling given: No   Past Medical History  Diagnosis Date  . Arthritis   . Hyperlipidemia     takes Zetia daily  . Urinary incontinence   . Cancer ([place])     skin cancer, back, legs melonama  . GERD (gastroesophageal reflux disease)     takes Omeprazole daily  . Hypertension     takes HCTZ daily  . PONV (postoperative nausea and vomiting)   . Myocardial infarction [facility])     pt states EKG always shows infarct but no change from previous ekg;never knew anything about it  . Headache(784.0)   . Bacterial infection     in lungs in Jan 2015  . Shortness of breath     with exertion  . Pneumonia     hx of-many yrs ago  . History of migraine     last  one about 15 yrs ago  . Osteoarthritis   . Joint swelling   . Joint pain   . Chronic back pain   . Urinary frequency   . Urinary urgency   . Nocturia   . Bilateral lower extremity edema   . Pneumonia    Past Surgical History  Procedure Laterality Date  . Knee surgery Bilateral 2009 and 2011  . Bladder tack  1998  . Tonsillectomy  as a child ? date  . Oophorectomy  1998    only one  . Blepharoplasty Bilateral   . Total knee arthroplasty Right 02/22/2013    DR LUCEY  . Total knee arthroplasty Right 02/22/2013    Procedure: TOTAL KNEE ARTHROPLASTY;  Surgeon: Vickey Huger, MD;  Location: [facility];  Service: Orthopedics;  Laterality: Right;  . Joint replacement Left 10/02/2009  . Joint replacement Right 02-22-13    Knee   Family History  Problem Relation Age of Onset  . Kidney disease Mother   . Heart disease Mother   . Hypertension Mother   . Varicose Veins Mother   . Kidney failure Mother   . Alcohol abuse Father   . Stroke  Father     several   . Hypertension Father   . Varicose Veins Father   . Alcohol abuse Brother   . Hyperlipidemia Brother   . Hypertension Brother   . Early death Brother   . Varicose Veins Brother   . Varicose Veins Son   . Deep vein thrombosis Son   . Heart disease Brother   . Hypertension Brother   . Arthritis Brother     Gout   History  Sexual Activity  . Sexual Activity: Not on file    Outpatient Encounter Prescriptions as of 04/03/2015  Medication Sig  . Coenzyme Q10 (COQ10 PO) Take 1 capsule by mouth daily.  . furosemide (LASIX) 40 MG tablet Take 1 tablet (40 mg total) by mouth daily.  . GuaiFENesin (MUCINEX PO) Take 1 tablet by mouth daily as needed.  . hydrochlorothiazide (HYDRODIURIL) 25 MG tablet take 1 tablet by mouth once daily  . ibuprofen (ADVIL,MOTRIN) 100 MG tablet Take 100 mg by mouth every 6 (six) hours as needed for pain or fever.  . levothyroxine (SYNTHROID, LEVOTHROID) 50 MCG tablet take 1 tablet by mouth once daily  .  loratadine (CLARITIN) 10 MG tablet Take 10 mg by mouth daily.  [name] ZETIA 10 MG tablet take 1 tablet by mouth once daily  . [DISCONTINUED] predniSONE (DELTASONE) 20 MG tablet Take 1 tablet (20 mg total) by mouth daily with breakfast. (Patient not taking: Reported on 04/03/2015)   No facility-administered encounter medications on file as of 04/03/2015.    Activities of Daily Living In your present state of health, do you have any difficulty performing the following activities: 04/03/2015 03/22/2015  Hearing? Y N  Vision? N N  Difficulty concentrating or making decisions? N N  Walking or climbing stairs? Y N  Dressing or bathing? N N  Doing errands, shopping? N N  Preparing Food and eating ? N -  Using the Toilet? N -  In the past six months, have you accidently leaked urine? Y -  Do you have problems with loss of bowel control? N -  Managing your Medications? N -  Managing your Finances? N -  Housekeeping or managing your Housekeeping? N -    Patient Care Team: Midge Minium, MD as PCP - General (Family Medicine) Vickey Huger, MD as Consulting Physician (Orthopedic Surgery) Harriett Sine, MD as Consulting Physician (Dermatology) Jana Half, DPM as Consulting Physician (Podiatry)    Assessment:  Deferred to PCP.    Exercise Activities and Dietary recommendations Current Exercise Habits: Home exercise routine, Type of exercise: walking, Time (Minutes): 30, Frequency (Times/Week): 3, Weekly Exercise (Minutes/Week): 90  Diet:  Regular diet.  Three meals per day.  Cutting back on fried foods.  Increasing fruit and vegetables.    Goals    . Eat more fruits and vegetables    . Increase physical activity     Silver Sneakers Go back to [employer] with next door neighbor.        Fall Risk Fall Risk  04/03/2015 03/22/2015 12/26/2014 11/23/2014 03/21/2014  Falls in the past year? No No No No No  Number falls in past yr: - - - - -  Injury with Fall? - - - - -  Risk for  fall due to : Impaired mobility;History of fall(s) - - - -  Risk for fall due to (comments): "Have to be very careful when I walk" - - - -   Depression Screen PHQ 2/9 Scores 04/03/2015 03/22/2015  12/26/2014 11/23/2014  PHQ - 2 Score 0 0 0 0     Cognitive Testing MMSE - Mini Mental State Exam 04/03/2015  Orientation to time 5  Orientation to Place 5  Registration 3  Attention/ Calculation 5  Recall 3  Language- name 2 objects 2  Language- repeat 1  Language- follow 3 step command 3  Language- read & follow direction 1  Write a sentence 1  Copy design 1  Total score 30    Immunization History  Administered Date(s) Administered  . Influenza,inj,Quad PF,36+ Mos 09/21/2014  . Pneumococcal Conjugate-13 09/21/2014  . Td 11/26/2012   Screening Tests Health Maintenance  Topic Date Due  . ZOSTAVAX  10/08/2015 (Originally 10/07/1994)  . MAMMOGRAM  05/10/2015  . INFLUENZA VACCINE  08/08/2015  . PNA vac Low Risk Adult (2 of 2 - PPSV23) 09/21/2015  . TETANUS/TDAP  11/27/2022  . DEXA SCAN  Completed      Plan:  Bring a copy of Advanced Directives/Living Will to next visit.   Continue doing brain stimulating activities (puzzles, reading, adult coloring books, staying active) to keep memory sharp.   Eat heart healthy diet (full of fruits, vegetables, whole grains, lean protein, water--limit salt, fat, and sugar intake) and increase physical activity as tolerated.  Look into Silver Sneakers and find a gym or fitness center in which to start fitness routine.  Follow up with Dr. Birdie Riddle as scheduled.  She is currently at her new office in [place].  [employer] directions given.     During the course of the visit the patient was educated and counseled about the following appropriate screening and preventive services:   Vaccines to include Pneumoccal, Influenza, Hepatitis B, Td, Zostavax, HCV  Electrocardiogram  Cardiovascular Disease  Colorectal cancer screening  Bone density  screening  Diabetes screening  Glaucoma screening  Mammography/PAP  Nutrition counseling   Patient Instructions (the written plan) was given to the patient.   Rudene Anda, RN  04/03/2015

## 2015-04-03 NOTE — Telephone Encounter (Signed)
Pt is returning call. She says to also want to inform the nurse that she also sees Dr. Ellouise Newer for headaches. Pt says that she get 2 cat scans a year.    CB: [phone]

## 2015-04-04 ENCOUNTER — Ambulatory Visit
Admission: RE | Admit: 2015-04-04 | Discharge: 2015-04-04 | Disposition: A | Discharge status: Home / Self Care | Payer: Medicare Other | Source: Ambulatory Visit

## 2015-04-04 DIAGNOSIS — Z1231 Encounter for screening mammogram for malignant neoplasm of breast: Secondary | ICD-10-CM

## 2015-04-05 ENCOUNTER — Encounter: Payer: Self-pay | Admitting: Family Medicine

## 2015-04-05 ENCOUNTER — Ambulatory Visit (INDEPENDENT_AMBULATORY_CARE_PROVIDER_SITE_OTHER): Payer: Medicare Other | Admitting: Family Medicine

## 2015-04-05 VITALS — BP 124/78 | HR 82 | Temp 98.2°F | Resp 16 | Ht 64.0 in | Wt 222.5 lb

## 2015-04-05 DIAGNOSIS — R600 Localized edema: Secondary | ICD-10-CM | POA: Diagnosis not present

## 2015-04-05 LAB — BASIC METABOLIC PANEL
BUN: 26 mg/dL — ABNORMAL HIGH (ref 6–23)
CO2: 28 mEq/L (ref 19–32)
Calcium: 9.8 mg/dL (ref 8.4–10.5)
Chloride: 96 mEq/L (ref 96–112)
Creatinine, Ser: 1.14 mg/dL (ref 0.40–1.20)
GFR: 48.68 mL/min — ABNORMAL LOW (ref >60.00)
Glucose, Bld: 79 mg/dL (ref 70–99)
Potassium: 3.4 mEq/L — ABNORMAL LOW (ref 3.5–5.1)
Sodium: 135 mEq/L (ref 135–145)

## 2015-04-05 NOTE — Assessment & Plan Note (Signed)
Improved w/ addition of Lasix.  Legs are much less swollen, not as red or sore.  Check BMP.  If Cr and K+ are WNL, will increase Lasix to 60mg  daily as pt continues to have mild LE edema.  Reviewed supportive care and red flags that should prompt return.  Pt expressed understanding and is in agreement w/ plan.

## 2015-04-05 NOTE — Progress Notes (Signed)
Pre visit review using our clinic review tool, if applicable. No additional management support is needed unless otherwise documented below in the visit note. 

## 2015-04-05 NOTE — Patient Instructions (Signed)
Follow up as scheduled We'll notify you of your lab results and make any medication changes if needed Continue to drink plenty of fluids Elevate your legs when you are sitting Continue to be active Call with any questions or concerns Hang in there!!!

## 2015-04-05 NOTE — Progress Notes (Signed)
   Subjective:    Patient ID: Kristen Manning, female    DOB: 07-10-1932, 82 y.o.   MRN: [id number]  HPI Edema- pt reports leg swelling is much better since starting the Lasix.  Has ECHO next Wednesday.  Legs are much less sore.  No CP or SOB above baseline.  Redness has improved.  Pt has lost 7 lbs of fluid.  Sleeping better b/c she is no longer waking multiple times to use the restroom.   Review of Systems For ROS see HPI     Objective:   Physical Exam  Constitutional: She is oriented to person, place, and time. She appears well-developed and well-nourished. No distress.  HENT:  Head: Normocephalic and atraumatic.  Eyes: Conjunctivae and EOM are normal. Pupils are equal, round, and reactive to light.  Neck: Normal range of motion. Neck supple. No thyromegaly present.  Cardiovascular: Normal rate, regular rhythm, normal heart sounds and intact distal pulses.   No murmur heard. Pulmonary/Chest: Effort normal and breath sounds normal. No respiratory distress.  Abdominal: Soft. She exhibits no distension. There is no tenderness.  Musculoskeletal: She exhibits edema (1+ nonpitting edema bilaterally). She exhibits no tenderness.  Lymphadenopathy:    She has no cervical adenopathy.  Neurological: She is alert and oriented to person, place, and time.  Skin: Skin is warm and dry.  Psychiatric: She has a normal mood and affect. Her behavior is normal.  Vitals reviewed.         Assessment & Plan:

## 2015-04-06 ENCOUNTER — Encounter: Payer: Self-pay | Admitting: General Practice

## 2015-04-08 ENCOUNTER — Other Ambulatory Visit: Payer: Self-pay | Admitting: Family Medicine

## 2015-04-10 NOTE — Telephone Encounter (Signed)
Medication filled to pharmacy as requested.   

## 2015-04-12 ENCOUNTER — Ambulatory Visit (HOSPITAL_BASED_OUTPATIENT_CLINIC_OR_DEPARTMENT_OTHER)
Admission: RE | Admit: 2015-04-12 | Discharge: 2015-04-12 | Disposition: A | Discharge status: Home / Self Care | Payer: Medicare Other | Source: Ambulatory Visit | Attending: Family Medicine | Admitting: Family Medicine

## 2015-04-12 DIAGNOSIS — E785 Hyperlipidemia, unspecified: Secondary | ICD-10-CM | POA: Diagnosis not present

## 2015-04-12 DIAGNOSIS — R0602 Shortness of breath: Secondary | ICD-10-CM | POA: Insufficient documentation

## 2015-04-12 DIAGNOSIS — R600 Localized edema: Secondary | ICD-10-CM | POA: Insufficient documentation

## 2015-04-12 DIAGNOSIS — I119 Hypertensive heart disease without heart failure: Secondary | ICD-10-CM | POA: Diagnosis not present

## 2015-04-12 NOTE — Progress Notes (Signed)
  Echocardiogram 2D Echocardiogram has been performed.  Bobbye Charleston 04/12/2015, 2:55 PM

## 2015-04-14 ENCOUNTER — Telehealth: Payer: Self-pay | Admitting: Family Medicine

## 2015-04-14 DIAGNOSIS — H26053 Posterior subcapsular polar infantile and juvenile cataract, bilateral: Secondary | ICD-10-CM | POA: Diagnosis not present

## 2015-04-14 DIAGNOSIS — H2513 Age-related nuclear cataract, bilateral: Secondary | ICD-10-CM | POA: Diagnosis not present

## 2015-04-14 NOTE — Telephone Encounter (Signed)
Pt calling for result for ECHO she had done

## 2015-04-14 NOTE — Telephone Encounter (Signed)
Did you get these results 

## 2015-04-14 NOTE — Telephone Encounter (Signed)
Yes- have not had time to thoroughly review.  Will comment later today

## 2015-04-15 NOTE — Assessment & Plan Note (Signed)
Ongoing issue for pt but now w/ LE edema must r/o CHF.  Get ECHO, BNP.  Start Lasix.  If no improvement in sxs w/ diuresis, will refer to pulm for complete evaluation.  Pt expressed understanding and is in agreement w/ plan.

## 2015-04-15 NOTE — Assessment & Plan Note (Signed)
Deteriorated.  Pt w/ impressive amounts of swelling on PE today.  Legs are tight and painful.  Start Lasix.  Check labs to r/o underlying metabolic cause.  Reviewed dietary and lifestyle modifications to improve swelling.  Reviewed supportive care and red flags that should prompt return.  Pt expressed understanding and is in agreement w/ plan.

## 2015-04-17 NOTE — Telephone Encounter (Signed)
Encounter closed, PCP sent these in a result note.

## 2015-05-10 ENCOUNTER — Other Ambulatory Visit: Payer: Self-pay | Admitting: Family Medicine

## 2015-05-10 NOTE — Telephone Encounter (Signed)
Medication filled to pharmacy as requested.   

## 2015-05-11 ENCOUNTER — Ambulatory Visit
Admission: RE | Admit: 2015-05-11 | Discharge: 2015-05-11 | Disposition: A | Discharge status: Home / Self Care | Payer: Medicare Other | Source: Ambulatory Visit

## 2015-05-11 DIAGNOSIS — Z1231 Encounter for screening mammogram for malignant neoplasm of breast: Secondary | ICD-10-CM | POA: Diagnosis not present

## 2015-05-26 ENCOUNTER — Ambulatory Visit (HOSPITAL_BASED_OUTPATIENT_CLINIC_OR_DEPARTMENT_OTHER)
Admission: RE | Admit: 2015-05-26 | Discharge: 2015-05-26 | Disposition: A | Discharge status: Home / Self Care | Payer: Medicare Other | Source: Ambulatory Visit | Attending: Family Medicine | Admitting: Family Medicine

## 2015-05-26 ENCOUNTER — Other Ambulatory Visit: Payer: Self-pay | Admitting: Family Medicine

## 2015-05-26 ENCOUNTER — Encounter: Payer: Self-pay | Admitting: Family Medicine

## 2015-05-26 ENCOUNTER — Ambulatory Visit (INDEPENDENT_AMBULATORY_CARE_PROVIDER_SITE_OTHER): Payer: Medicare Other | Admitting: Family Medicine

## 2015-05-26 VITALS — BP 140/89 | HR 72 | Temp 98.1°F | Resp 16 | Ht 64.0 in | Wt 227.4 lb

## 2015-05-26 DIAGNOSIS — M7989 Other specified soft tissue disorders: Secondary | ICD-10-CM

## 2015-05-26 DIAGNOSIS — M79609 Pain in unspecified limb: Secondary | ICD-10-CM

## 2015-05-26 DIAGNOSIS — M25572 Pain in left ankle and joints of left foot: Secondary | ICD-10-CM | POA: Insufficient documentation

## 2015-05-26 DIAGNOSIS — R609 Edema, unspecified: Secondary | ICD-10-CM | POA: Insufficient documentation

## 2015-05-26 DIAGNOSIS — M25562 Pain in left knee: Secondary | ICD-10-CM | POA: Diagnosis not present

## 2015-05-26 MED ORDER — CELECOXIB 200 MG PO CAPS: 200.0000 mg | ORAL_CAPSULE | Freq: Every day | ORAL | Status: DC

## 2015-05-26 NOTE — Assessment & Plan Note (Signed)
Recurrent problem.  Suspect that she ate increased salt diet while she was traveling.  Encouraged her to take the Lasix.  Will get venous doppler to r/o DVT of L leg due to pain behind knee.  Reviewed need for adequate water intake, low Na diet, elevation of legs while sitting.  Reviewed supportive care and red flags that should prompt return.  Pt expressed understanding and is in agreement w/ plan.

## 2015-05-26 NOTE — Addendum Note (Signed)
Addended by: Davis Gourd on: 05/26/2015 02:26 PM   Modules accepted: Orders

## 2015-05-26 NOTE — Assessment & Plan Note (Signed)
New to provider, recurrent for pt.  She does have bilateral knee replacements but she states ortho told her that the joint replacements were in good shape.  Due to unilateral pain that started after a road trip will get venous doppler to r/o DVT.  Start Celebrex for inflammatory pain.  Reviewed supportive care and red flags that should prompt return.  Pt expressed understanding and is in agreement w/ plan.

## 2015-05-26 NOTE — Patient Instructions (Signed)
Go to [place] [facility] for your Ultrasound today at 2:30pm- we will notify you of your results Start the Celebrex once daily for pain and inflammation.  Take w/ food. Continue to alternate ice/heat for pain relief If no clot and no improvement w/ Celebrex, please let me know and we'll send you to Ortho Call with any questions or concerns Hang in there!!!

## 2015-05-26 NOTE — Progress Notes (Signed)
Pre visit review using our clinic review tool, if applicable. No additional management support is needed unless otherwise documented below in the visit note. 

## 2015-05-26 NOTE — Progress Notes (Signed)
   Subjective:    Patient ID: Kristen Manning, female    DOB: Nov 08, 1932, 82 y.o.   MRN: [id number]  HPI Leg swelling- L knee pain yesterday after driving home from [place].  Did have pain while visiting in [place] that woke her at night.  Had difficulty getting out of the car due to pain.  Pt reports she has LBP that radiates into her buttock.  Pain is concentrated in the knee and the travels into back of lower leg.  Has sensation that R knee is 'going to give way'.  Pt has hx of knee replacement bilaterally.  Pt w/ mild swelling of legs bilaterally.  Pain is predominately behind L knee- improved somewhat w/ heat yesterday.  Denies CP, SOB.  Only took 1/2 tab of Lasix today b/c she knew she had to drive to her appt.  Plans to take other 1/2 tab when she returns home.   Review of Systems For ROS see HPI     Objective:   Physical Exam  Constitutional: She is oriented to person, place, and time. She appears well-developed and well-nourished. No distress.  HENT:  Head: Normocephalic and atraumatic.  Cardiovascular: Normal rate and regular rhythm.   Pulmonary/Chest: Effort normal and breath sounds normal. No respiratory distress. She has no wheezes. She has no rales.  Musculoskeletal: She exhibits edema (1+ edema of LEs bilaterally) and tenderness (TTP over bilateral lower extremities but increased TTP behind L knee).  Mild TTP over L SI joint  Neurological: She is alert and oriented to person, place, and time.  Skin: Skin is warm and dry. There is erythema (mild lower leg anterior redness).  Psychiatric: She has a normal mood and affect. Her behavior is normal. Thought content normal.  Vitals reviewed.         Assessment & Plan:

## 2015-05-30 DIAGNOSIS — M79605 Pain in left leg: Secondary | ICD-10-CM | POA: Diagnosis not present

## 2015-05-30 DIAGNOSIS — M5416 Radiculopathy, lumbar region: Secondary | ICD-10-CM | POA: Diagnosis not present

## 2015-06-01 ENCOUNTER — Telehealth: Payer: Self-pay | Admitting: Family Medicine

## 2015-06-01 DIAGNOSIS — M5136 Other intervertebral disc degeneration, lumbar region: Secondary | ICD-10-CM | POA: Diagnosis not present

## 2015-06-01 DIAGNOSIS — M79605 Pain in left leg: Secondary | ICD-10-CM | POA: Diagnosis not present

## 2015-06-01 DIAGNOSIS — I5189 Other ill-defined heart diseases: Secondary | ICD-10-CM

## 2015-06-01 DIAGNOSIS — M5416 Radiculopathy, lumbar region: Secondary | ICD-10-CM | POA: Diagnosis not present

## 2015-06-01 NOTE — Telephone Encounter (Signed)
[facility] for referral to [facility], please specify Dr Gwenlyn Found (dx diastolic dysfxn)

## 2015-06-01 NOTE — Telephone Encounter (Signed)
Referral placed.

## 2015-06-01 NOTE — Telephone Encounter (Signed)
Pt is asking for a referral to see dr berry cardiologist, fax # [phone]

## 2015-06-16 ENCOUNTER — Ambulatory Visit: Payer: Medicare Other | Admitting: Podiatry

## 2015-06-20 DIAGNOSIS — M10072 Idiopathic gout, left ankle and foot: Secondary | ICD-10-CM | POA: Diagnosis not present

## 2015-06-20 DIAGNOSIS — M25572 Pain in left ankle and joints of left foot: Secondary | ICD-10-CM | POA: Diagnosis not present

## 2015-06-20 DIAGNOSIS — M25571 Pain in right ankle and joints of right foot: Secondary | ICD-10-CM | POA: Diagnosis not present

## 2015-06-20 DIAGNOSIS — I739 Peripheral vascular disease, unspecified: Secondary | ICD-10-CM | POA: Diagnosis not present

## 2015-06-20 DIAGNOSIS — M10071 Idiopathic gout, right ankle and foot: Secondary | ICD-10-CM | POA: Diagnosis not present

## 2015-06-20 DIAGNOSIS — M10079 Idiopathic gout, unspecified ankle and foot: Secondary | ICD-10-CM | POA: Diagnosis not present

## 2015-06-27 DIAGNOSIS — M10072 Idiopathic gout, left ankle and foot: Secondary | ICD-10-CM | POA: Diagnosis not present

## 2015-06-27 DIAGNOSIS — M10071 Idiopathic gout, right ankle and foot: Secondary | ICD-10-CM | POA: Diagnosis not present

## 2015-06-30 ENCOUNTER — Encounter: Payer: Self-pay | Admitting: Cardiovascular Disease

## 2015-06-30 ENCOUNTER — Encounter: Payer: Self-pay | Admitting: *Deleted

## 2015-06-30 ENCOUNTER — Ambulatory Visit (INDEPENDENT_AMBULATORY_CARE_PROVIDER_SITE_OTHER): Payer: Medicare Other | Admitting: Cardiovascular Disease

## 2015-06-30 VITALS — BP 140/66 | HR 92 | Ht 65.0 in | Wt 229.0 lb

## 2015-06-30 DIAGNOSIS — E785 Hyperlipidemia, unspecified: Secondary | ICD-10-CM | POA: Diagnosis not present

## 2015-06-30 DIAGNOSIS — R0602 Shortness of breath: Secondary | ICD-10-CM

## 2015-06-30 DIAGNOSIS — R079 Chest pain, unspecified: Secondary | ICD-10-CM

## 2015-06-30 DIAGNOSIS — I10 Essential (primary) hypertension: Secondary | ICD-10-CM | POA: Diagnosis not present

## 2015-06-30 DIAGNOSIS — M7989 Other specified soft tissue disorders: Secondary | ICD-10-CM

## 2015-06-30 MED ORDER — FUROSEMIDE 40 MG PO TABS: 40.0000 mg | ORAL_TABLET | Freq: Two times a day (BID) | ORAL | Status: DC

## 2015-06-30 NOTE — Assessment & Plan Note (Signed)
History of bilateral lower extremity edema worse over the last several months. She does have some changes of cellulitis.Recent 2-D echo showed normal LV function with grade 2 diastolic dysfunction. She is on 40 mg of Lasix once a day. I'm going to increase this to twice a day and we'll see her back in one month for follow-up.

## 2015-06-30 NOTE — Progress Notes (Signed)
06/30/2015 Kristen Manning   Apr 25, 1932  [id number]  Primary Physician Annye Asa, MD Primary Cardiologist: Lorretta Harp MD Renae Gloss  HPI:  Kristen Manning is a 82 year old moderate to severely overweight widowed Caucasian female mother of 16, grandmother of her grandchildren referred by Dr. Birdie Riddle for cardiovascular evaluation. Her brother [name] is also a patient here. Has a history of treated hypertension and hyperlipidemia. She has had bilateral total knee replacements and has gout She received pneumonia and flu vaccine in the fall of last year. Should she suddenly developed pneumonia and the flow. Less so she's had increasing lower extremity edema and dyspnea on exertion with occasional chest pain.   Current Outpatient Prescriptions  Medication Sig Dispense Refill  . allopurinol (ZYLOPRIM) 300 MG tablet Take 300 mg by mouth daily.  0  . Coenzyme Q10 (COQ10 PO) Take 1 capsule by mouth daily.    . furosemide (LASIX) 40 MG tablet Take 1 tablet (40 mg total) by mouth daily. 30 tablet 3  . GuaiFENesin (MUCINEX PO) Take 1 tablet by mouth daily as needed.    . hydrochlorothiazide (HYDRODIURIL) 25 MG tablet take 1 tablet by mouth once daily 30 tablet 2  . ibuprofen (ADVIL,MOTRIN) 100 MG tablet Take 100 mg by mouth every 6 (six) hours as needed for pain or fever.    . levothyroxine (SYNTHROID, LEVOTHROID) 50 MCG tablet take 1 tablet by mouth once daily 30 tablet 6  . loratadine (CLARITIN) 10 MG tablet Take 10 mg by mouth daily.    . meloxicam (MOBIC) 7.5 MG tablet Take 7.5 mg by mouth daily.    . methylPREDNISolone (MEDROL DOSEPAK) 4 MG TBPK tablet Take as directed per package as needed for gout attack.  0  . omeprazole (PRILOSEC) 20 MG capsule Take 20 mg by mouth daily.    [name] PRESCRIPTION MEDICATION Apply 1 application topically 4 (four) times daily as needed (gout pain). Med Name: SHERTECH CREAM    . ZETIA 10 MG tablet take 1 tablet by mouth once daily 30 tablet  5   No current facility-administered medications for this visit.    Allergies  Allergen Reactions  . Aspirin Other (See Comments)    Noted caused 2 holes in retina, not sure   . Codeine Hives    Social History   Social History  . Marital Status: Widowed    Spouse Name: N/A  . Number of Children: N/A  . Years of Education: N/A   Occupational History  . Not on file.   Social History Main Topics  . Smoking status: Never Smoker   . Smokeless tobacco: Never Used  . Alcohol Use: No  . Drug Use: No  . Sexual Activity: Not on file   Other Topics Concern  . Not on file   Social History Narrative     Review of Systems: General: negative for chills, fever, night sweats or weight changes.  Cardiovascular: negative for chest pain, dyspnea on exertion, edema, orthopnea, palpitations, paroxysmal nocturnal dyspnea or shortness of breath Dermatological: negative for rash Respiratory: negative for cough or wheezing Urologic: negative for hematuria Abdominal: negative for nausea, vomiting, diarrhea, bright red blood per rectum, melena, or hematemesis Neurologic: negative for visual changes, syncope, or dizziness All other systems reviewed and are otherwise negative except as noted above.    Blood pressure 140/66, pulse 92, height 5\' 5"  (1.651 m), weight 229 lb (103.874 kg).  General appearance: alert and no distress Neck: no adenopathy, no  carotid bruit, no JVD, supple, symmetrical, trachea midline and thyroid not enlarged, symmetric, no tenderness/mass/nodules Lungs: clear to auscultation bilaterally Heart: regular rate and rhythm, S1, S2 normal, no murmur, click, rub or gallop Extremities: 2-3+ pitting edema bilaterally with venous stasis changes.  EKG normal sinus rhythm at 86 without ST or T-wave changes.  ASSESSMENT AND PLAN:   Hyperlipidemia History of hyperlipidemia intolerant to statin therapy with recent lipid profile performed 03/22/15 revealing total cholesterol  193, LDL 106 and HDL of 55  HTN (hypertension) istory of hypertension blood pressure measured at 140/66. She is on hydrochlorothiazide. Continue current meds current dosing  Swelling of limb History of bilateral lower extremity edema worse over the last several months. She does have some changes of cellulitis.Recent 2-D echo showed normal LV function with grade 2 diastolic dysfunction. She is on 40 mg of Lasix once a day. I'm going to increase this to twice a day and we'll see her back in one month for follow-up.  Shortness of breath The patient does complain of increasing dyspnea on exertion over the last several months coincident with her increased lower extremity edema. 2-D echo revealed normal LV systolic function with grade 2 diastolic dysfunction. She does also complain of episodic chest pain I'm going to get a formal classic Myoview stress test and adjust her diuretic dosing.      Lorretta Harp MD FACP,FACC,FAHA, [facility] 06/30/2015 2:10 PM

## 2015-06-30 NOTE — Assessment & Plan Note (Signed)
The patient does complain of increasing dyspnea on exertion over the last several months coincident with her increased lower extremity edema. 2-D echo revealed normal LV systolic function with grade 2 diastolic dysfunction. She does also complain of episodic chest pain I'm going to get a formal classic Myoview stress test and adjust her diuretic dosing.

## 2015-06-30 NOTE — Assessment & Plan Note (Signed)
History of hyperlipidemia intolerant to statin therapy with recent lipid profile performed 03/22/15 revealing total cholesterol 193, LDL 106 and HDL of 55

## 2015-06-30 NOTE — Patient Instructions (Signed)
Medication Instructions:  Your physician has recommended you make the following change in your medication:  1- INCREASE YOUR Furosemide to 40 mg (1 tablet) twice a day.   Labwork: Your physician recommends that you return for lab work in [place] (ON OR AROUND July 7).   Testing/Procedures: Your physician has requested that you have a lexiscan myoview. For further information please visit [email or web address]. Please follow instruction sheet, as given.    Follow-Up: Your physician recommends that you schedule a follow-up appointment in: 1 MONTH   Pharmacologic Stress Echocardiogram A pharmacologic stress echocardiogram is a heart (cardiac) test used to check the function of your heart. This test may also be called a pharmacologic stress echocardiography. Pharmacologic means that a medicine is used to increase your heart rate and blood pressure.  This stress test will check how well your heart muscle and valves are working and determine if your heart muscle is getting enough blood. Some people exercise on a treadmill, which naturally increases or stresses the functioning of their heart. For those people unable to exercise on a treadmill, a medicine is used. This medicine stimulates your heart and will cause your heart to beat harder and more quickly, as if you were exercising.  An echocardiogram uses sound waves (ultrasound) to produce an image of your heart. If your heart does not work normally, it may indicate coronary artery disease with poor coronary blood supply. The coronary arteries are the arteries that bring blood and oxygen to your heart. LET [facility] CARE PROVIDER KNOW ABOUT:  Any allergies you have.  All medicines you are taking, including vitamins, herbs, eye drops, creams, and over-the-counter medicines.  Previous problems you or members of your family have had with the use of anesthetics.  Any blood disorders you have.  Previous surgeries you have had.  Medical  conditions you have.  Possibility of pregnancy, if this applies. RISKS AND COMPLICATIONS Generally, this is a safe procedure. However, as with any procedure, complications can occur. Possible complications can include:  You develop pain or pressure in the following areas:  Chest.  Jaw or neck.  Between your shoulder blades.  Radiating down your left arm.  Headache.  Dizziness or light-headedness.  Shortness of breath.  Increased or irregular heartbeat.  Low blood pressure.  Nausea or vomiting.  Flushing.  Redness going up the arm and slight pain during injection of medicine.  Heart attack (rare). BEFORE THE PROCEDURE  Avoid all forms of caffeine for 24 hours before your test or as directed by your health care provider. This includes coffee, tea (even decaffeinated tea), caffeinated sodas, chocolate, cocoa, and certain pain medicines.  Follow your health care provider's instructions regarding eating and drinking before the test.  Take your medicines as directed at regular times with water unless instructed otherwise. Exceptions may include:  If you have diabetes, ask how you are to take your insulin or pills. It is common to adjust insulin dosing the morning of the test.  If you are taking beta-blocker medicines, it is important to talk to your health care provider about these medicines well before the date of your test. Taking beta-blocker medicines may interfere with the test. In some cases, these medicines need to be changed or stopped 24 hours or more before the test.  If you wear a nitroglycerin patch, it may need to be removed prior to the test. Ask your health care provider if the patch should be removed before the test.  If you use  an inhaler for any breathing condition, bring it with you to the test.  If you are an outpatient, bring a snack so you can eat right after the stress phase of the test.  Do not smoke for 4 hours prior to the test or as directed by  your health care provider.  Wear comfortable shoes and clothing. Let your health care provider know if you were unable to complete or follow the preparations for your test. PROCEDURE   Multiple electrodes will be put on your chest. If needed, small areas of your chest may be shaved to get better contact with the electrodes. Once the electrodes are attached to your body, multiple wires will be attached to the electrodes, and your heart rate will be monitored.  An IV access will be started, and medicine will be given.  You will have an echocardiogram done at rest and done again at peak heart rate.  To produce an image of the heart, gel is applied to your chest, and a wand-like tool (transducer) is moved over the chest. The transducer sends the sound waves through the chest to create the moving images of your heart. AFTER THE PROCEDURE  Your heart rate and blood pressure will be monitored after the test.  You may return to your normal schedule, including diet, activities, and medicines, unless your health care provider tells you otherwise.   This information is not intended to replace advice given to you by your health care provider. Make sure you discuss any questions you have with your health care provider.   Document Released: 03/16/2003 Document Revised: 12/29/2012 Document Reviewed: 08/31/2012 Elsevier Interactive Patient Education 2016 [employer].    Any Other Special Instructions Will Be Listed Below (If Applicable).     If you need a refill on your cardiac medications before your next appointment, please call your pharmacy.

## 2015-06-30 NOTE — Assessment & Plan Note (Signed)
istory of hypertension blood pressure measured at 140/66. She is on hydrochlorothiazide. Continue current meds current dosing

## 2015-07-05 ENCOUNTER — Inpatient Hospital Stay (HOSPITAL_COMMUNITY): Admission: RE | Admit: 2015-07-05 | Payer: Medicare Other | Source: Ambulatory Visit

## 2015-07-11 ENCOUNTER — Other Ambulatory Visit: Payer: Self-pay | Admitting: Family Medicine

## 2015-07-12 NOTE — Telephone Encounter (Signed)
Medication filled to pharmacy as requested.   

## 2015-07-14 DIAGNOSIS — R079 Chest pain, unspecified: Secondary | ICD-10-CM | POA: Diagnosis not present

## 2015-07-14 DIAGNOSIS — R0602 Shortness of breath: Secondary | ICD-10-CM | POA: Diagnosis not present

## 2015-07-14 DIAGNOSIS — I10 Essential (primary) hypertension: Secondary | ICD-10-CM | POA: Diagnosis not present

## 2015-07-14 DIAGNOSIS — M7989 Other specified soft tissue disorders: Secondary | ICD-10-CM | POA: Diagnosis not present

## 2015-07-14 DIAGNOSIS — E785 Hyperlipidemia, unspecified: Secondary | ICD-10-CM | POA: Diagnosis not present

## 2015-07-15 LAB — BASIC METABOLIC PANEL
BUN: 23 mg/dL (ref 7–25)
CO2: 23 mmol/L (ref 20–31)
Calcium: 9 mg/dL (ref 8.6–10.4)
Chloride: 100 mmol/L (ref 98–110)
Creat: 1.23 mg/dL — ABNORMAL HIGH (ref 0.60–0.88)
Glucose, Bld: 80 mg/dL (ref 65–99)
Potassium: 4.2 mmol/L (ref 3.5–5.3)
Sodium: 137 mmol/L (ref 135–146)

## 2015-07-18 ENCOUNTER — Telehealth (HOSPITAL_COMMUNITY): Payer: Self-pay

## 2015-07-18 NOTE — Telephone Encounter (Signed)
Encounter complete. 

## 2015-07-20 ENCOUNTER — Ambulatory Visit (HOSPITAL_COMMUNITY)
Admission: RE | Admit: 2015-07-20 | Discharge: 2015-07-20 | Disposition: A | Discharge status: Home / Self Care | Payer: Medicare Other | Source: Ambulatory Visit | Attending: Cardiovascular Disease | Admitting: Cardiovascular Disease

## 2015-07-20 DIAGNOSIS — Z6838 Body mass index (BMI) 38.0-38.9, adult: Secondary | ICD-10-CM | POA: Insufficient documentation

## 2015-07-20 DIAGNOSIS — M7989 Other specified soft tissue disorders: Secondary | ICD-10-CM | POA: Insufficient documentation

## 2015-07-20 DIAGNOSIS — E785 Hyperlipidemia, unspecified: Secondary | ICD-10-CM

## 2015-07-20 DIAGNOSIS — I10 Essential (primary) hypertension: Secondary | ICD-10-CM | POA: Insufficient documentation

## 2015-07-20 DIAGNOSIS — R0602 Shortness of breath: Secondary | ICD-10-CM | POA: Diagnosis not present

## 2015-07-20 DIAGNOSIS — R0609 Other forms of dyspnea: Secondary | ICD-10-CM | POA: Insufficient documentation

## 2015-07-20 DIAGNOSIS — R079 Chest pain, unspecified: Secondary | ICD-10-CM | POA: Diagnosis not present

## 2015-07-20 DIAGNOSIS — E669 Obesity, unspecified: Secondary | ICD-10-CM | POA: Insufficient documentation

## 2015-07-20 MED ORDER — TECHNETIUM TC 99M TETROFOSMIN IV KIT
31.0000 | PACK | Freq: Once | INTRAVENOUS | Status: AC | PRN
  Administered 2015-07-20: 31 via INTRAVENOUS
  Filled 2015-07-20: qty 31

## 2015-07-20 MED ORDER — REGADENOSON 0.4 MG/5ML IV SOLN
0.4000 mg | Freq: Once | INTRAVENOUS | Status: AC
  Administered 2015-07-20: 0.4 mg via INTRAVENOUS

## 2015-07-21 ENCOUNTER — Ambulatory Visit (HOSPITAL_COMMUNITY)
Admission: RE | Admit: 2015-07-21 | Discharge: 2015-07-21 | Disposition: A | Discharge status: Home / Self Care | Payer: Medicare Other | Source: Ambulatory Visit | Attending: Cardiovascular Disease | Admitting: Cardiovascular Disease

## 2015-07-21 LAB — MYOCARDIAL PERFUSION IMAGING
LV dias vol: 87 mL (ref 46–106)
LV sys vol: 29 mL
Peak HR: 106 {beats}/min
Rest HR: 78 {beats}/min
SDS: 5
SRS: 3
SSS: 8
TID: 1.24

## 2015-07-21 MED ORDER — TECHNETIUM TC 99M TETROFOSMIN IV KIT
30.0000 | PACK | Freq: Once | INTRAVENOUS | Status: AC | PRN
  Administered 2015-07-21: 30 via INTRAVENOUS

## 2015-07-25 ENCOUNTER — Telehealth: Payer: Self-pay | Admitting: Cardiovascular Disease

## 2015-07-25 NOTE — Telephone Encounter (Signed)
Follow-up ° ° ° ° ° °The pt is returning Jennifer's call °

## 2015-07-25 NOTE — Telephone Encounter (Signed)
Notified patient of her myoview results. She verbalized understanding.  Notes Recorded by Lorretta Harp, MD on 07/23/2015 at 11:00 AM Call and tell normal

## 2015-07-27 DIAGNOSIS — Z96653 Presence of artificial knee joint, bilateral: Secondary | ICD-10-CM | POA: Diagnosis not present

## 2015-07-27 DIAGNOSIS — Z471 Aftercare following joint replacement surgery: Secondary | ICD-10-CM | POA: Diagnosis not present

## 2015-07-27 DIAGNOSIS — Z96651 Presence of right artificial knee joint: Secondary | ICD-10-CM | POA: Diagnosis not present

## 2015-07-27 DIAGNOSIS — Z96652 Presence of left artificial knee joint: Secondary | ICD-10-CM | POA: Diagnosis not present

## 2015-08-01 ENCOUNTER — Ambulatory Visit (INDEPENDENT_AMBULATORY_CARE_PROVIDER_SITE_OTHER): Payer: Medicare Other | Admitting: Cardiovascular Disease

## 2015-08-01 ENCOUNTER — Encounter: Payer: Self-pay | Admitting: Cardiovascular Disease

## 2015-08-01 VITALS — BP 133/79 | HR 90 | Ht 65.0 in | Wt 220.4 lb

## 2015-08-01 DIAGNOSIS — R600 Localized edema: Secondary | ICD-10-CM | POA: Diagnosis not present

## 2015-08-01 DIAGNOSIS — R0989 Other specified symptoms and signs involving the circulatory and respiratory systems: Secondary | ICD-10-CM | POA: Diagnosis not present

## 2015-08-01 DIAGNOSIS — R0789 Other chest pain: Secondary | ICD-10-CM | POA: Insufficient documentation

## 2015-08-01 DIAGNOSIS — Z79899 Other long term (current) drug therapy: Secondary | ICD-10-CM

## 2015-08-01 NOTE — Assessment & Plan Note (Signed)
Kristen Manning had a Myoview stress test performed because of chest pain on 07/25/15 which was entirely normal with an EF of 67%. I reassured her that it is unlikely that her chest pain is cardiac in nature.

## 2015-08-01 NOTE — Patient Instructions (Signed)
Medication Instructions:  Your physician recommends that you continue on your current medications as directed. Please refer to the Current Medication list given to you today.   Labwork: Your physician recommends that you return for lab work in: 2-3 WEEKS. The lab can be found on the FIRST FLOOR of out building in [address]   Testing/Procedures: Your physician has requested that you have a carotid duplex. This test is an ultrasound of the carotid arteries in your neck. It looks at blood flow through these arteries that supply the brain with blood. Allow one hour for this exam. There are no restrictions or special instructions.    Follow-Up: We request that you follow-up in: 3 MONTHS with an extender and in 12 MONTHS with Dr Andria Rhein will receive a reminder letter in the mail two months in advance. If you don't receive a letter, please call our office to schedule the follow-up appointment.   If you need a refill on your cardiac medications before your next appointment, please call your pharmacy.

## 2015-08-01 NOTE — Progress Notes (Signed)
08/01/2015 Kristen Manning   Oct 02, 1932  [id number]  Primary Physician Annye Asa, MD Primary Cardiologist: Lorretta Harp MD Renae Gloss  HPI:   Kristen Manning is a 82 year old moderate to severely overweight widowed Caucasian female mother of 47, grandmother of her grandchildren referred by Dr. Birdie Riddle for cardiovascular evaluation. I last saw her in the office 06/30/15. Her brother Kristen Manning is also a patient here. Has a history of treated hypertension and hyperlipidemia. She has had bilateral total knee replacements and has gout She received pneumonia and flu vaccine in the fall of last year. Should she suddenly developed pneumonia and the flow. She has complained of increasing lower extremity edema and dyspnea on exertion with occasional chest pain. I doubled her Lasix to twice a day which resulted in marked improvement in her edema. She has lost 9 pounds of fluid weight since I saw her a month ago. The Myoview stress test likewise was completely normal.   Current Outpatient Prescriptions  Medication Sig Dispense Refill  . naproxen sodium (ANAPROX) 220 MG tablet Take 220 mg by mouth as directed.    [name] allopurinol (ZYLOPRIM) 300 MG tablet Take 300 mg by mouth daily.  0  . Coenzyme Q10 (COQ10 PO) Take 1 capsule by mouth daily.    . furosemide (LASIX) 40 MG tablet Take 1 tablet (40 mg total) by mouth 2 (two) times daily. 180 tablet 3  . GuaiFENesin (MUCINEX PO) Take 1 tablet by mouth daily as needed.    . hydrochlorothiazide (HYDRODIURIL) 25 MG tablet take 1 tablet by mouth once daily 30 tablet 6  . levothyroxine (SYNTHROID, LEVOTHROID) 50 MCG tablet take 1 tablet by mouth once daily 30 tablet 6  . loratadine (CLARITIN) 10 MG tablet Take 10 mg by mouth daily.    . methylPREDNISolone (MEDROL DOSEPAK) 4 MG TBPK tablet Take as directed per package as needed for gout attack.  0  . omeprazole (PRILOSEC) 20 MG capsule Take 20 mg by mouth daily.    [name] PRESCRIPTION MEDICATION  Apply 1 application topically 4 (four) times daily as needed (gout pain). Med Name: SHERTECH CREAM    . ZETIA 10 MG tablet take 1 tablet by mouth once daily 30 tablet 5   No current facility-administered medications for this visit.     Allergies  Allergen Reactions  . Aspirin Other (See Comments)    Noted caused 2 holes in retina, not sure   . Codeine Hives    Social History   Social History  . Marital status: Widowed    Spouse name: N/A  . Number of children: N/A  . Years of education: N/A   Occupational History  . Not on file.   Social History Main Topics  . Smoking status: Never Smoker  . Smokeless tobacco: Never Used  . Alcohol use No  . Drug use: No  . Sexual activity: Not on file   Other Topics Concern  . Not on file   Social History Narrative  . No narrative on file     Review of Systems: General: negative for chills, fever, night sweats or weight changes.  Cardiovascular: negative for chest pain, dyspnea on exertion, edema, orthopnea, palpitations, paroxysmal nocturnal dyspnea or shortness of breath Dermatological: negative for rash Respiratory: negative for cough or wheezing Urologic: negative for hematuria Abdominal: negative for nausea, vomiting, diarrhea, bright red blood per rectum, melena, or hematemesis Neurologic: negative for visual changes, syncope, or dizziness All other systems reviewed and are  otherwise negative except as noted above.    Blood pressure 133/79, pulse 90, height 5\' 5"  (1.651 m), weight 220 lb 6.4 oz (100 kg).  General appearance: alert and no distress Neck: no adenopathy, no JVD, supple, symmetrical, trachea midline, thyroid not enlarged, symmetric, no tenderness/mass/nodules and Soft right carotid bruit Lungs: clear to auscultation bilaterally Heart: regular rate and rhythm, S1, S2 normal, no murmur, click, rub or gallop Extremities: Trace bilateral lower extremity edema  EKG not performed today  ASSESSMENT AND PLAN:    Lower leg edema Markedly improved since increasing her Lasix from 40 mg once a day to twice a day. Her weight has come down 9 pounds. She has less abdominal bloating and is less short of breath. Her serum creatinine however has mildly increased from 1.14-1.23. We will recheck this. I will keep her on the same dose of Lasix twice a day and will have her see mid-level provider back in 3 months.  Chest pain Kristen Manning had a Myoview stress test performed because of chest pain on 07/25/15 which was entirely normal with an EF of 67%. I reassured her that it is unlikely that her chest pain is cardiac in nature.      Lorretta Harp MD FACP,FACC,FAHA, [facility] 08/01/2015 9:19 AM

## 2015-08-01 NOTE — Assessment & Plan Note (Signed)
Markedly improved since increasing her Lasix from 40 mg once a day to twice a day. Her weight has come down 9 pounds. She has less abdominal bloating and is less short of breath. Her serum creatinine however has mildly increased from 1.14-1.23. We will recheck this. I will keep her on the same dose of Lasix twice a day and will have her see mid-level provider back in 3 months.

## 2015-08-03 ENCOUNTER — Telehealth: Payer: Self-pay | Admitting: Neurology

## 2015-08-03 NOTE — Telephone Encounter (Signed)
Left message on machine for patient to call back.

## 2015-08-03 NOTE — Telephone Encounter (Signed)
Reviewed last patient note - is she supposed to have repeat CT to follow Meningioma? Please advise.

## 2015-08-03 NOTE — Telephone Encounter (Signed)
The repeat scan we did last year looked good, no need to repeat unless she has a change in her symptoms. Thanks

## 2015-08-03 NOTE — Telephone Encounter (Signed)
Patient made aware.

## 2015-08-07 ENCOUNTER — Ambulatory Visit (HOSPITAL_COMMUNITY)
Admission: RE | Admit: 2015-08-07 | Discharge: 2015-08-07 | Disposition: A | Discharge status: Home / Self Care | Payer: Medicare Other | Source: Ambulatory Visit | Attending: Internal Medicine | Admitting: Internal Medicine

## 2015-08-07 DIAGNOSIS — R600 Localized edema: Secondary | ICD-10-CM | POA: Insufficient documentation

## 2015-08-07 DIAGNOSIS — R0989 Other specified symptoms and signs involving the circulatory and respiratory systems: Secondary | ICD-10-CM | POA: Insufficient documentation

## 2015-08-07 DIAGNOSIS — E785 Hyperlipidemia, unspecified: Secondary | ICD-10-CM | POA: Insufficient documentation

## 2015-08-07 DIAGNOSIS — I10 Essential (primary) hypertension: Secondary | ICD-10-CM | POA: Diagnosis not present

## 2015-08-07 DIAGNOSIS — I6523 Occlusion and stenosis of bilateral carotid arteries: Secondary | ICD-10-CM | POA: Diagnosis not present

## 2015-08-07 DIAGNOSIS — Z79899 Other long term (current) drug therapy: Secondary | ICD-10-CM | POA: Diagnosis not present

## 2015-08-07 DIAGNOSIS — K219 Gastro-esophageal reflux disease without esophagitis: Secondary | ICD-10-CM | POA: Diagnosis not present

## 2015-08-08 ENCOUNTER — Encounter: Payer: Self-pay | Admitting: *Deleted

## 2015-08-08 NOTE — Telephone Encounter (Signed)
-----   Message from Lorretta Harp, MD sent at 08/08/2015  9:32 AM EDT ----- Essentially normal study. Repeat when clinically indicated.

## 2015-08-08 NOTE — Telephone Encounter (Signed)
This encounter was created in error - please disregard.

## 2015-08-16 DIAGNOSIS — Z79899 Other long term (current) drug therapy: Secondary | ICD-10-CM | POA: Diagnosis not present

## 2015-08-16 DIAGNOSIS — R0989 Other specified symptoms and signs involving the circulatory and respiratory systems: Secondary | ICD-10-CM | POA: Diagnosis not present

## 2015-08-16 DIAGNOSIS — R600 Localized edema: Secondary | ICD-10-CM | POA: Diagnosis not present

## 2015-08-16 LAB — BASIC METABOLIC PANEL
BUN: 16 mg/dL (ref 7–25)
CO2: 28 mmol/L (ref 20–31)
Calcium: 9.1 mg/dL (ref 8.6–10.4)
Chloride: 98 mmol/L (ref 98–110)
Creat: 1.05 mg/dL — ABNORMAL HIGH (ref 0.60–0.88)
Glucose, Bld: 93 mg/dL (ref 65–99)
Potassium: 4.1 mmol/L (ref 3.5–5.3)
Sodium: 134 mmol/L — ABNORMAL LOW (ref 135–146)

## 2015-08-30 DIAGNOSIS — M25561 Pain in right knee: Secondary | ICD-10-CM | POA: Diagnosis not present

## 2015-08-30 DIAGNOSIS — M25552 Pain in left hip: Secondary | ICD-10-CM | POA: Diagnosis not present

## 2015-08-30 DIAGNOSIS — S60511A Abrasion of right hand, initial encounter: Secondary | ICD-10-CM | POA: Diagnosis not present

## 2015-08-30 DIAGNOSIS — M25521 Pain in right elbow: Secondary | ICD-10-CM | POA: Diagnosis not present

## 2015-08-31 ENCOUNTER — Emergency Department (HOSPITAL_BASED_OUTPATIENT_CLINIC_OR_DEPARTMENT_OTHER)
Admission: EM | Admit: 2015-08-31 | Discharge: 2015-08-31 | Disposition: A | Discharge status: Home / Self Care | Payer: Medicare Other | Attending: Emergency Medicine | Admitting: Emergency Medicine

## 2015-08-31 ENCOUNTER — Encounter (HOSPITAL_BASED_OUTPATIENT_CLINIC_OR_DEPARTMENT_OTHER): Payer: Self-pay | Admitting: Emergency Medicine

## 2015-08-31 ENCOUNTER — Emergency Department (HOSPITAL_BASED_OUTPATIENT_CLINIC_OR_DEPARTMENT_OTHER): Payer: Medicare Other

## 2015-08-31 DIAGNOSIS — S300XXA Contusion of lower back and pelvis, initial encounter: Secondary | ICD-10-CM | POA: Insufficient documentation

## 2015-08-31 DIAGNOSIS — Z85848 Personal history of malignant neoplasm of other parts of nervous tissue: Secondary | ICD-10-CM | POA: Diagnosis not present

## 2015-08-31 DIAGNOSIS — S5012XA Contusion of left forearm, initial encounter: Secondary | ICD-10-CM | POA: Insufficient documentation

## 2015-08-31 DIAGNOSIS — Z79899 Other long term (current) drug therapy: Secondary | ICD-10-CM | POA: Insufficient documentation

## 2015-08-31 DIAGNOSIS — W19XXXA Unspecified fall, initial encounter: Secondary | ICD-10-CM | POA: Diagnosis not present

## 2015-08-31 DIAGNOSIS — Y999 Unspecified external cause status: Secondary | ICD-10-CM | POA: Diagnosis not present

## 2015-08-31 DIAGNOSIS — Y939 Activity, unspecified: Secondary | ICD-10-CM | POA: Insufficient documentation

## 2015-08-31 DIAGNOSIS — T07XXXA Unspecified multiple injuries, initial encounter: Secondary | ICD-10-CM

## 2015-08-31 DIAGNOSIS — I10 Essential (primary) hypertension: Secondary | ICD-10-CM | POA: Insufficient documentation

## 2015-08-31 DIAGNOSIS — Y92810 Car as the place of occurrence of the external cause: Secondary | ICD-10-CM | POA: Diagnosis not present

## 2015-08-31 DIAGNOSIS — Z85828 Personal history of other malignant neoplasm of skin: Secondary | ICD-10-CM | POA: Insufficient documentation

## 2015-08-31 DIAGNOSIS — S40021A Contusion of right upper arm, initial encounter: Secondary | ICD-10-CM | POA: Diagnosis not present

## 2015-08-31 DIAGNOSIS — S59911A Unspecified injury of right forearm, initial encounter: Secondary | ICD-10-CM | POA: Diagnosis present

## 2015-08-31 DIAGNOSIS — S59901A Unspecified injury of right elbow, initial encounter: Secondary | ICD-10-CM | POA: Diagnosis not present

## 2015-08-31 DIAGNOSIS — M25521 Pain in right elbow: Secondary | ICD-10-CM | POA: Diagnosis not present

## 2015-08-31 MED ORDER — MORPHINE SULFATE 15 MG PO TABS: 30.0000 mg | ORAL_TABLET | Freq: Four times a day (QID) | ORAL | 0 refills | Status: DC | PRN

## 2015-08-31 MED ORDER — ACETAMINOPHEN 500 MG PO TABS: 500.0000 mg | ORAL_TABLET | Freq: Four times a day (QID) | ORAL | 0 refills | Status: AC

## 2015-08-31 MED ORDER — ACETAMINOPHEN 500 MG PO TABS
1000.0000 mg | ORAL_TABLET | Freq: Once | ORAL | Status: AC
  Administered 2015-08-31: 1000 mg via ORAL
  Filled 2015-08-31: qty 2

## 2015-08-31 MED ORDER — ONDANSETRON 4 MG PO TBDP
4.0000 mg | ORAL_TABLET | Freq: Once | ORAL | Status: AC
  Administered 2015-08-31: 4 mg via ORAL
  Filled 2015-08-31: qty 1

## 2015-08-31 MED ORDER — MORPHINE SULFATE 15 MG PO TABS
15.0000 mg | ORAL_TABLET | Freq: Once | ORAL | Status: DC
  Filled 2015-08-31: qty 1

## 2015-08-31 MED FILL — MORPHINE SULFATE IR 15 MG T: 15 | 1 days supply | Qty: 4 | Fill #0

## 2015-08-31 MED FILL — MAPAP 500 MG CAPLET: 500 | 25 days supply | Qty: 100 | Fill #0
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
## 2015-08-31 NOTE — ED Notes (Signed)
MD at bedside. 

## 2015-08-31 NOTE — ED Notes (Signed)
Patient transported to X-ray 

## 2015-09-01 ENCOUNTER — Encounter: Payer: Self-pay | Admitting: Family Medicine

## 2015-09-01 ENCOUNTER — Ambulatory Visit (INDEPENDENT_AMBULATORY_CARE_PROVIDER_SITE_OTHER): Payer: Medicare Other | Admitting: Family Medicine

## 2015-09-01 DIAGNOSIS — S59811D Other specified injuries right forearm, subsequent encounter: Secondary | ICD-10-CM

## 2015-09-01 NOTE — Patient Instructions (Signed)
Follow up as needed Alternate ice (no more than 20 minutes at a time) and heat (as long as is comfortable) to the R forearm Apply ice or heat- whichever is more comfortable to the large bruises on upper thigh and upper arm Call with any questions or concerns Hang in there!!!

## 2015-09-01 NOTE — Progress Notes (Signed)
Pre visit review using our clinic review tool, if applicable. No additional management support is needed unless otherwise documented below in the visit note. 
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
## 2015-09-09 ENCOUNTER — Other Ambulatory Visit: Payer: Self-pay | Admitting: Family Medicine

## 2015-09-14 ENCOUNTER — Encounter: Payer: Self-pay | Admitting: Neurology

## 2015-09-14 ENCOUNTER — Ambulatory Visit (INDEPENDENT_AMBULATORY_CARE_PROVIDER_SITE_OTHER): Payer: Medicare Other | Admitting: Neurology

## 2015-09-14 VITALS — BP 140/70 | HR 88 | Temp 97.5°F | Ht 65.0 in | Wt 223.3 lb

## 2015-09-14 DIAGNOSIS — G44009 Cluster headache syndrome, unspecified, not intractable: Secondary | ICD-10-CM | POA: Diagnosis not present

## 2015-09-14 DIAGNOSIS — G4489 Other headache syndrome: Secondary | ICD-10-CM

## 2015-09-14 DIAGNOSIS — D329 Benign neoplasm of meninges, unspecified: Secondary | ICD-10-CM

## 2015-09-14 DIAGNOSIS — G44209 Tension-type headache, unspecified, not intractable: Secondary | ICD-10-CM

## 2015-09-14 NOTE — Patient Instructions (Addendum)
1. Refer to Allergy specialist 2. Follow-up in 1 year, call for any changes and if allergy treatment is ineffective
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
## 2015-09-25 ENCOUNTER — Encounter: Payer: Self-pay | Admitting: Family Medicine

## 2015-09-25 ENCOUNTER — Ambulatory Visit (INDEPENDENT_AMBULATORY_CARE_PROVIDER_SITE_OTHER): Payer: Medicare Other | Admitting: Family Medicine

## 2015-09-25 ENCOUNTER — Ambulatory Visit (HOSPITAL_BASED_OUTPATIENT_CLINIC_OR_DEPARTMENT_OTHER)
Admission: RE | Admit: 2015-09-25 | Discharge: 2015-09-25 | Disposition: A | Discharge status: Home / Self Care | Payer: Medicare Other | Source: Ambulatory Visit | Attending: Family Medicine | Admitting: Family Medicine

## 2015-09-25 VITALS — BP 130/78 | HR 96 | Temp 97.9°F | Resp 16 | Ht 65.0 in | Wt 219.5 lb

## 2015-09-25 DIAGNOSIS — M7989 Other specified soft tissue disorders: Secondary | ICD-10-CM | POA: Insufficient documentation

## 2015-09-25 DIAGNOSIS — M79601 Pain in right arm: Secondary | ICD-10-CM | POA: Diagnosis not present

## 2015-09-25 NOTE — Patient Instructions (Addendum)
Go get your ultrasound done at [facility] notify you of your results and determine the next steps If there is no clot (fingers crossed!) we'll refer you to ortho for complete evaluation and treatment If there is a clot, we will need to start blood thinners (as directed in the sample pack).  If no clot- DO NOT TAKE the medication!! Call with any questions or concerns Hang in there!!!
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
## 2015-09-25 NOTE — Progress Notes (Signed)
Pre visit review using our clinic review tool, if applicable. No additional management support is needed unless otherwise documented below in the visit note. 

## 2015-10-04 ENCOUNTER — Encounter: Payer: Self-pay | Admitting: Family Medicine

## 2015-10-04 ENCOUNTER — Ambulatory Visit (INDEPENDENT_AMBULATORY_CARE_PROVIDER_SITE_OTHER): Payer: Medicare Other | Admitting: Family Medicine

## 2015-10-04 VITALS — BP 130/86 | HR 90 | Temp 98.1°F | Resp 16 | Ht 65.0 in | Wt 218.4 lb

## 2015-10-04 DIAGNOSIS — E785 Hyperlipidemia, unspecified: Secondary | ICD-10-CM

## 2015-10-04 DIAGNOSIS — I10 Essential (primary) hypertension: Secondary | ICD-10-CM | POA: Diagnosis not present

## 2015-10-04 DIAGNOSIS — E038 Other specified hypothyroidism: Secondary | ICD-10-CM

## 2015-10-04 LAB — BASIC METABOLIC PANEL WITH GFR
BUN: 28 mg/dL — ABNORMAL HIGH (ref 6–23)
CO2: 31 meq/L (ref 19–32)
Calcium: 8.9 mg/dL (ref 8.4–10.5)
Chloride: 96 meq/L (ref 96–112)
Creatinine, Ser: 1.55 mg/dL — ABNORMAL HIGH (ref 0.40–1.20)
GFR: 34.11 mL/min — ABNORMAL LOW
Glucose, Bld: 78 mg/dL (ref 70–99)
Potassium: 4.2 meq/L (ref 3.5–5.1)
Sodium: 136 meq/L (ref 135–145)

## 2015-10-04 LAB — LIPID PANEL
Cholesterol: 196 mg/dL (ref 0–200)
HDL: 48.7 mg/dL
NonHDL: 147.09
Total CHOL/HDL Ratio: 4
Triglycerides: 212 mg/dL — ABNORMAL HIGH (ref 0.0–149.0)
VLDL: 42.4 mg/dL — ABNORMAL HIGH (ref 0.0–40.0)

## 2015-10-04 LAB — CBC WITH DIFFERENTIAL/PLATELET
Basophils Absolute: 0 10*3/uL (ref 0.0–0.1)
Basophils Relative: 0.2 % (ref 0.0–3.0)
Eosinophils Absolute: 0.1 10*3/uL (ref 0.0–0.7)
Eosinophils Relative: 1.3 % (ref 0.0–5.0)
HCT: 36.8 % (ref 36.0–46.0)
Hemoglobin: 12.5 g/dL (ref 12.0–15.0)
Lymphocytes Relative: 16.5 % (ref 12.0–46.0)
Lymphs Abs: 1.4 10*3/uL (ref 0.7–4.0)
MCHC: 34 g/dL (ref 30.0–36.0)
MCV: 89.7 fl (ref 78.0–100.0)
Monocytes Absolute: 0.6 10*3/uL (ref 0.1–1.0)
Monocytes Relative: 7.3 % (ref 3.0–12.0)
Neutro Abs: 6.5 10*3/uL (ref 1.4–7.7)
Neutrophils Relative %: 74.7 % (ref 43.0–77.0)
Platelets: 355 10*3/uL (ref 150.0–400.0)
RBC: 4.1 Mil/uL (ref 3.87–5.11)
RDW: 16.3 % — ABNORMAL HIGH (ref 11.5–15.5)
WBC: 8.6 10*3/uL (ref 4.0–10.5)

## 2015-10-04 LAB — LDL CHOLESTEROL, DIRECT: Direct LDL: 111 mg/dL

## 2015-10-04 LAB — HEPATIC FUNCTION PANEL
ALT: 15 U/L (ref 0–35)
AST: 22 U/L (ref 0–37)
Albumin: 3.7 g/dL (ref 3.5–5.2)
Alkaline Phosphatase: 139 U/L — ABNORMAL HIGH (ref 39–117)
Bilirubin, Direct: 0.1 mg/dL (ref 0.0–0.3)
Total Bilirubin: 0.5 mg/dL (ref 0.2–1.2)
Total Protein: 7.1 g/dL (ref 6.0–8.3)

## 2015-10-04 LAB — TSH: TSH: 3.48 u[IU]/mL (ref 0.35–4.50)

## 2015-10-04 NOTE — Assessment & Plan Note (Signed)
chronic problem.  Check labs.  Adjust meds prn

## 2015-10-04 NOTE — Assessment & Plan Note (Signed)
Chronic problem.  Adequate control.  Asymptomatic w/ exception of chronic leg swelling and SOB.  Check labs.  No anticipated med changes.  Will follow.

## 2015-10-04 NOTE — Patient Instructions (Signed)
Schedule your complete physical in 6 months We'll notify you of your lab results and make any changes if needed Continue to work on healthy diet and regular exercise- you can do it!!  And it will build your stamina! Call with any questions or concerns Happy Early Birthday!!!!

## 2015-10-04 NOTE — Progress Notes (Signed)
Pre visit review using our clinic review tool, if applicable. No additional management support is needed unless otherwise documented below in the visit note. 

## 2015-10-04 NOTE — Progress Notes (Signed)
   Subjective:    Patient ID: Kristen Manning, female    DOB: [birth year], 82 y.o.   MRN: [id number]  HPI HTN- chronic problem, on HCTZ and Lasix w/ adequate control.  Denies CP, SOB above baseline, HAs, visual changes, edema more than baseline  Hyperlipidemia- chronic problem, on Zetia daily due to statin intolerance.  No abd pain, N/V, myalgias.  Hypothyroid- chronic problem, on Levothyroxine daily.  Some fatigue- 'tired all the time'.  + chronic constipation.  Review of Systems For ROS see HPI     Objective:   Physical Exam  Constitutional: She is oriented to person, place, and time. She appears well-developed and well-nourished. No distress.  obese  HENT:  Head: Normocephalic and atraumatic.  Eyes: Conjunctivae and EOM are normal. Pupils are equal, round, and reactive to light.  Neck: Normal range of motion. Neck supple. No thyromegaly present.  Cardiovascular: Normal rate, regular rhythm, normal heart sounds and intact distal pulses.   No murmur heard. Pulmonary/Chest: Effort normal and breath sounds normal. No respiratory distress.  Abdominal: Soft. She exhibits no distension. There is no tenderness.  Musculoskeletal: She exhibits edema (chronic edema of LEs bilaterally). She exhibits no tenderness.  Lymphadenopathy:    She has no cervical adenopathy.  Neurological: She is alert and oriented to person, place, and time.  Skin: Skin is warm and dry.  Chronic venous stasis changes  Psychiatric: She has a normal mood and affect. Her behavior is normal.  Vitals reviewed.         Assessment & Plan:

## 2015-10-04 NOTE — Assessment & Plan Note (Signed)
Chronic problem.  Tolerating Zetia w/o difficulty.  Check labs.  Since pt is intolerant to statins stressed need for healthy diet and regular exercise.  Will follow.

## 2015-10-05 ENCOUNTER — Other Ambulatory Visit: Payer: Self-pay | Admitting: General Practice

## 2015-10-05 ENCOUNTER — Other Ambulatory Visit (INDEPENDENT_AMBULATORY_CARE_PROVIDER_SITE_OTHER): Payer: Medicare Other

## 2015-10-05 ENCOUNTER — Other Ambulatory Visit: Payer: Self-pay | Admitting: Family Medicine

## 2015-10-05 ENCOUNTER — Encounter: Payer: Self-pay | Admitting: General Practice

## 2015-10-05 DIAGNOSIS — R799 Abnormal finding of blood chemistry, unspecified: Secondary | ICD-10-CM

## 2015-10-05 DIAGNOSIS — R748 Abnormal levels of other serum enzymes: Secondary | ICD-10-CM | POA: Diagnosis not present

## 2015-10-05 DIAGNOSIS — R7989 Other specified abnormal findings of blood chemistry: Secondary | ICD-10-CM

## 2015-10-05 LAB — GAMMA GT: GGT: 33 U/L (ref 7–51)

## 2015-10-05 MED ORDER — FENOFIBRATE 160 MG PO TABS: 160.0000 mg | ORAL_TABLET | Freq: Every day | ORAL | 6 refills | Status: DC

## 2015-10-16 ENCOUNTER — Other Ambulatory Visit: Payer: Medicare Other

## 2015-10-17 ENCOUNTER — Telehealth: Payer: Self-pay | Admitting: General Practice

## 2015-10-17 ENCOUNTER — Other Ambulatory Visit (INDEPENDENT_AMBULATORY_CARE_PROVIDER_SITE_OTHER): Payer: Medicare Other

## 2015-10-17 DIAGNOSIS — R799 Abnormal finding of blood chemistry, unspecified: Secondary | ICD-10-CM | POA: Diagnosis not present

## 2015-10-17 DIAGNOSIS — R7989 Other specified abnormal findings of blood chemistry: Secondary | ICD-10-CM

## 2015-10-17 LAB — BASIC METABOLIC PANEL
BUN: 27 mg/dL — ABNORMAL HIGH (ref 6–23)
CO2: 32 mEq/L (ref 19–32)
Calcium: 9.2 mg/dL (ref 8.4–10.5)
Chloride: 99 mEq/L (ref 96–112)
Creatinine, Ser: 1.57 mg/dL — ABNORMAL HIGH (ref 0.40–1.20)
GFR: 33.6 mL/min — ABNORMAL LOW (ref >60.00)
Glucose, Bld: 76 mg/dL (ref 70–99)
Potassium: 4.3 mEq/L (ref 3.5–5.1)
Sodium: 140 mEq/L (ref 135–145)

## 2015-10-17 NOTE — Telephone Encounter (Signed)
She would need a referral to oculoplastics for this.  Often, the eye doctor will make a referral for this but we can if needed

## 2015-10-17 NOTE — Telephone Encounter (Signed)
Pt came in for labs today and was wondering if you could give her the name of a provider or place a referral for eyelid droop. She was advised by her eye doctor that the skin is impeding her vision and should be removed.

## 2015-10-17 NOTE — Telephone Encounter (Signed)
Called patient and left message with her Ronny Bacon on [place]. Advising patient to contact her eye doctor for the referral. If he is unable to do the referral we can refer her to the [facility] for the eyelid droop.

## 2015-10-19 DIAGNOSIS — H1045 Other chronic allergic conjunctivitis: Secondary | ICD-10-CM | POA: Diagnosis not present

## 2015-10-19 DIAGNOSIS — J3089 Other allergic rhinitis: Secondary | ICD-10-CM | POA: Diagnosis not present

## 2015-10-19 DIAGNOSIS — B999 Unspecified infectious disease: Secondary | ICD-10-CM | POA: Diagnosis not present

## 2015-10-19 DIAGNOSIS — R062 Wheezing: Secondary | ICD-10-CM | POA: Diagnosis not present

## 2015-10-23 ENCOUNTER — Ambulatory Visit (INDEPENDENT_AMBULATORY_CARE_PROVIDER_SITE_OTHER): Payer: Medicare Other | Admitting: Family Medicine

## 2015-10-23 ENCOUNTER — Encounter: Payer: Self-pay | Admitting: Family Medicine

## 2015-10-23 VITALS — BP 148/56 | HR 88 | Temp 97.7°F | Ht 65.0 in | Wt 221.2 lb

## 2015-10-23 DIAGNOSIS — J0100 Acute maxillary sinusitis, unspecified: Secondary | ICD-10-CM

## 2015-10-23 NOTE — Patient Instructions (Signed)
Claritin (loratadine), Allegra (fexofenadine), Zyrtec (cetirizine); these are listed in order from weakest to strongest. Generic, and therefore cheaper, options are in the parentheses.   Flonase (fluticasone); nasal spray that is over the counter. 2 sprays each nostril, once daily. Aim towards the same side eye when you spray.  There are available OTC, and the generic versions, which may be cheaper, are in parentheses. Show this to a pharmacist if you have trouble finding any of these items.  Guaifenesin is available OTC.  Tylenol, 1000 mg (2 extra strength tabs) 3 times daily will help with pain.

## 2015-10-23 NOTE — Progress Notes (Signed)
Pre visit review using our clinic review tool, if applicable. No additional management support is needed unless otherwise documented below in the visit note. 

## 2015-10-23 NOTE — Progress Notes (Signed)
Chief Complaint  Patient presents with  . Sinusitis    Pt reports noticing congestion headache and sore throat since saturday     Kristen Manning here for URI complaints.  Duration: 2 days; this seems to happen every day around the same time of year, she does have a hx of allergies Associated symptoms: sinus headache, runny nose, congestion and sore throat Denies: itchy watery eyes, ear pain, ear drainage, shortness of breath and fever Treatment to date: OTC cough/cold product of patient's choice PRN (tylenol, guaifenesin, dextromethorphan, and phenylephrine) Sick contacts: No  ROS:  Const: Denies fevers HEENT: As noted in HPI Lungs: No SOB  Past Medical History:  Diagnosis Date  . Arthritis   . Bacterial infection    in lungs in Jan 2015  . Bilateral lower extremity edema   . Cancer ([place])    skin cancer, back, legs melonama  . Chronic back pain   . GERD (gastroesophageal reflux disease)    takes Omeprazole daily  . Headache(784.0)   . History of migraine    last one about 15 yrs ago  . Hyperlipidemia    takes Zetia daily  . Hypertension    takes HCTZ daily  . Joint pain   . Joint swelling   . Myocardial infarction    pt states EKG always shows infarct but no change from previous ekg;never knew anything about it  . Nocturia   . Osteoarthritis   . Pneumonia    hx of-many yrs ago  . Pneumonia   . PONV (postoperative nausea and vomiting)   . Shortness of breath    with exertion  . Urinary frequency   . Urinary incontinence   . Urinary urgency    Family History  Problem Relation Age of Onset  . Kidney disease Mother   . Heart disease Mother   . Hypertension Mother   . Varicose Veins Mother   . Kidney failure Mother   . Alcohol abuse Father   . Stroke Father     several   . Hypertension Father   . Varicose Veins Father   . Alcohol abuse Brother   . Hyperlipidemia Brother   . Hypertension Brother   . Early death Brother   . Varicose Veins Brother   .  Heart disease Brother   . Hypertension Brother   . Arthritis Brother     Gout  . Varicose Veins Son   . Deep vein thrombosis Son     BP (!) 148/56 (BP Location: Left Arm, Patient Position: Sitting, Cuff Size: Large)   Pulse 88   Temp 97.7 F (36.5 C) (Oral)   Ht 5\' 5"  (1.651 m)   Wt 221 lb 3.2 oz (100.3 kg)   SpO2 97%   BMI 36.81 kg/m  General: Awake, alert, appears stated age HEENT: AT, [place], ears patent b/l and TM's neg, nares patent w/o discharge, +maxillary sinus tenderness, pharynx pink and without exudates, MMM Neck: No masses or asymmetry Heart: RRR, no murmurs, no bruits Lungs: CTAB, no accessory muscle use Psych: Age appropriate judgment and insight, normal mood and affect  Acute maxillary sinusitis, recurrence not specified  Orders as above. Likely related to allergies vs viral etiology. Will tx for allergic symptoms first. OTC antihistamines, Tylenol, and Flonase recommended. I'm OK with BP given age. F/u in 1 week if symptoms worsen or fail to improve. Pt voiced understanding and agreement to the plan.  [place], DO 10/23/15 2:34 PM

## 2015-10-26 ENCOUNTER — Encounter: Payer: Self-pay | Admitting: Family Medicine

## 2015-10-26 ENCOUNTER — Ambulatory Visit (INDEPENDENT_AMBULATORY_CARE_PROVIDER_SITE_OTHER): Payer: Medicare Other | Admitting: Family Medicine

## 2015-10-26 VITALS — BP 134/62 | HR 76 | Temp 97.9°F | Resp 16 | Ht 65.0 in | Wt 223.1 lb

## 2015-10-26 DIAGNOSIS — J0101 Acute recurrent maxillary sinusitis: Secondary | ICD-10-CM

## 2015-10-26 MED ORDER — AMOXICILLIN 875 MG PO TABS: 875.0000 mg | ORAL_TABLET | Freq: Two times a day (BID) | ORAL | 0 refills | Status: DC

## 2015-10-26 NOTE — Assessment & Plan Note (Signed)
Pt's sxs and PE consistent w/ infxn.  Start Amoxicillin twice daily.  Reviewed supportive care and red flags that should prompt return.  Pt expressed understanding and is in agreement w/ plan.

## 2015-10-26 NOTE — Progress Notes (Signed)
Pre visit review using our clinic review tool, if applicable. No additional management support is needed unless otherwise documented below in the visit note. 

## 2015-10-26 NOTE — Progress Notes (Signed)
   Subjective:    Patient ID: Kristen Manning, female    DOB: 22-Nov-1932, 82 y.o.   MRN: [id number]  HPI URI- pt was seen by Dr Nani Ravens 10/16 and told she had a viral/allergy URI.  sxs started on 10/14.  Pt reports 'pounding' headache, pressure behind her eyes, sore throat, cough.  + nasal congestion and sinus pain.  No fevers.  No known sick contacts.  No N/V.  No tooth pain.  + blood nasal drainage, 'it tastes awful'.   Review of Systems For ROS see HPI     Objective:   Physical Exam  Constitutional: She is oriented to person, place, and time. She appears well-developed and well-nourished. No distress.  HENT:  Head: Normocephalic and atraumatic.  Right Ear: Tympanic membrane normal.  Left Ear: Tympanic membrane normal.  Nose: Mucosal edema and rhinorrhea present. Right sinus exhibits maxillary sinus tenderness and frontal sinus tenderness. Left sinus exhibits maxillary sinus tenderness and frontal sinus tenderness.  Mouth/Throat: Uvula is midline and mucous membranes are normal. Posterior oropharyngeal erythema present. No oropharyngeal exudate.  Eyes: Conjunctivae and EOM are normal. Pupils are equal, round, and reactive to light.  Neck: Normal range of motion. Neck supple.  Cardiovascular: Normal rate, regular rhythm and normal heart sounds.   Pulmonary/Chest: Effort normal and breath sounds normal. No respiratory distress. She has no wheezes.  Lymphadenopathy:    She has no cervical adenopathy.  Neurological: She is alert and oriented to person, place, and time.  Skin: Skin is warm and dry.  Vitals reviewed.         Assessment & Plan:

## 2015-10-26 NOTE — Patient Instructions (Signed)
Follow up as needed Start the Amoxicillin twice daily- take w/ food Drink plenty of fluids Delsym as needed for cough REST! Call with any questions or concerns Hang in there!!!

## 2015-11-02 ENCOUNTER — Ambulatory Visit (INDEPENDENT_AMBULATORY_CARE_PROVIDER_SITE_OTHER): Payer: Medicare Other | Admitting: Family Medicine

## 2015-11-02 ENCOUNTER — Encounter: Payer: Self-pay | Admitting: Family Medicine

## 2015-11-02 VITALS — BP 128/72 | HR 87 | Temp 97.9°F | Resp 17 | Ht 65.0 in | Wt 220.0 lb

## 2015-11-02 DIAGNOSIS — I10 Essential (primary) hypertension: Secondary | ICD-10-CM | POA: Diagnosis not present

## 2015-11-02 LAB — BASIC METABOLIC PANEL
BUN: 22 mg/dL (ref 6–23)
CO2: 27 mEq/L (ref 19–32)
Calcium: 9.4 mg/dL (ref 8.4–10.5)
Chloride: 103 mEq/L (ref 96–112)
Creatinine, Ser: 1.52 mg/dL — ABNORMAL HIGH (ref 0.40–1.20)
GFR: 34.88 mL/min — ABNORMAL LOW (ref >60.00)
Glucose, Bld: 84 mg/dL (ref 70–99)
Potassium: 4.7 mEq/L (ref 3.5–5.1)
Sodium: 140 mEq/L (ref 135–145)

## 2015-11-02 NOTE — Progress Notes (Signed)
Pre visit review using our clinic review tool, if applicable. No additional management support is needed unless otherwise documented below in the visit note. 

## 2015-11-02 NOTE — Progress Notes (Signed)
   Subjective:    Patient ID: Kristen Manning, female    DOB: 23-Mar-1932, 82 y.o.   MRN: [id number]  HPI HTN- chronic problem.  Pt stopped her HCTZ at last visit due to elevated Cr since Cardiology increased her Lasix dose.  BP remains well controlled today on Lasix only.  Pt is down 3 lbs.  Pt reports she has been trying to eat better.  Has decreased her Na and carb intake.  Denies CP, SOB, visual changes, edema above baseline.   Review of Systems For ROS see HPI     Objective:   Physical Exam  Constitutional: She is oriented to person, place, and time. She appears well-developed and well-nourished. No distress.  obese  HENT:  Head: Normocephalic and atraumatic.  Eyes: Conjunctivae and EOM are normal. Pupils are equal, round, and reactive to light.  Neck: Normal range of motion. Neck supple. No thyromegaly present.  Cardiovascular: Normal rate, regular rhythm, normal heart sounds and intact distal pulses.   No murmur heard. Pulmonary/Chest: Effort normal and breath sounds normal. No respiratory distress.  Abdominal: Soft. She exhibits no distension. There is no tenderness.  Musculoskeletal: She exhibits edema (chronic LE edema due to venous stasis).  Lymphadenopathy:    She has no cervical adenopathy.  Neurological: She is alert and oriented to person, place, and time.  Skin: Skin is warm and dry.  Psychiatric: She has a normal mood and affect. Her behavior is normal.  Vitals reviewed.         Assessment & Plan:

## 2015-11-02 NOTE — Patient Instructions (Signed)
Schedule your complete physical in late March We'll notify you of your lab results and make any changes if needed Continue to work on healthy food choices and regular activity- you and your blood pressure look great!!! Call with any questions or concerns Happy Fall!!!

## 2015-11-02 NOTE — Assessment & Plan Note (Signed)
Chronic problem.  Well controlled today since stopping HCTZ.  Applauded her efforts at healthy diet and lifestyle changes.  Repeat BMP.  No anticipated med changes.  Will follow.

## 2015-11-07 DIAGNOSIS — I739 Peripheral vascular disease, unspecified: Secondary | ICD-10-CM | POA: Diagnosis not present

## 2015-11-07 DIAGNOSIS — I890 Lymphedema, not elsewhere classified: Secondary | ICD-10-CM | POA: Diagnosis not present

## 2015-11-11 ENCOUNTER — Other Ambulatory Visit: Payer: Self-pay | Admitting: Family Medicine

## 2015-11-20 ENCOUNTER — Other Ambulatory Visit: Payer: Self-pay | Admitting: *Deleted

## 2015-11-20 DIAGNOSIS — R609 Edema, unspecified: Secondary | ICD-10-CM

## 2015-11-27 ENCOUNTER — Telehealth: Payer: Self-pay

## 2015-11-27 NOTE — Telephone Encounter (Signed)
Spoke w/Kristen Manning by phone.  States she tried to exercise at the [facility] on her own a few times but wasn't sure what she should be doing and stopped.  Another time she tried the pool stating the water was too cold and she had no instruction so got discouraged and stopped.  I have explained how the PREP works and believe it will suite her well.  She will have weekly nutrition/lifestyle meetings on Wed. 11-12 and meet with a personal trainer who will assist her in learning appropriate exercises.  I will guide her on exercises in the pool as well as all the group fitness/[employer] classes we have to offer at the [facility]. She will be coming in to meet and register on Mon. 12/04/15.

## 2015-12-13 ENCOUNTER — Encounter: Payer: Self-pay | Admitting: Vascular Surgery

## 2015-12-19 ENCOUNTER — Ambulatory Visit (INDEPENDENT_AMBULATORY_CARE_PROVIDER_SITE_OTHER): Payer: Medicare Other | Admitting: Vascular Surgery

## 2015-12-19 ENCOUNTER — Encounter: Payer: Self-pay | Admitting: Vascular Surgery

## 2015-12-19 ENCOUNTER — Ambulatory Visit (HOSPITAL_COMMUNITY)
Admission: RE | Admit: 2015-12-19 | Discharge: 2015-12-19 | Disposition: A | Discharge status: Home / Self Care | Payer: Medicare Other | Source: Ambulatory Visit | Attending: Vascular Surgery | Admitting: Vascular Surgery

## 2015-12-19 VITALS — BP 140/82 | HR 72 | Temp 97.4°F | Resp 20 | Ht 64.0 in | Wt 216.0 lb

## 2015-12-19 DIAGNOSIS — I8393 Asymptomatic varicose veins of bilateral lower extremities: Secondary | ICD-10-CM | POA: Insufficient documentation

## 2015-12-19 DIAGNOSIS — I890 Lymphedema, not elsewhere classified: Secondary | ICD-10-CM

## 2015-12-19 DIAGNOSIS — R609 Edema, unspecified: Secondary | ICD-10-CM | POA: Insufficient documentation

## 2015-12-19 NOTE — Progress Notes (Signed)
[facility] [place]  Patient name: Kristen Manning MRN: [id number] DOB: [birth year] Sex: female  REASON FOR CONSULT: Lower extremity swelling  HPI: Kristen Manning is a 82 y.o. female, who is seen today for evaluation of lower extremity swelling. She is a very pleasant active 82 year old. She reports staged bilateral knee replacement. Left knee was 6 years ago right knee 2 years ago. She reports that she is noted progressive lower extremity swelling over the past 1-2 years. She reports this was significant up into her knee areas well such that her knees would actually rub together. She underwent a cardiac evaluation rule out congestive heart failure and this was negative and also has no evidence of renal insufficiency to calls this. She does elevate her legs when possible. Has no history of DVT. Does have erythema associated with this reports this can come and go and also has raised area over both of these areas as well. She does have diffuse tenderness over both lower extremities.  Past Medical History:  Diagnosis Date  . Arthritis   . Bacterial infection    in lungs in Jan 2015  . Bilateral lower extremity edema   . Cancer ([place])    skin cancer, back, legs melonama  . Chronic back pain   . GERD (gastroesophageal reflux disease)    takes Omeprazole daily  . Headache(784.0)   . History of migraine    last one about 15 yrs ago  . Hyperlipidemia    takes Zetia daily  . Hypertension    takes HCTZ daily  . Joint pain   . Joint swelling   . Myocardial infarction    pt states EKG always shows infarct but no change from previous ekg;never knew anything about it  . Nocturia   . Osteoarthritis   . Pneumonia    hx of-many yrs ago  . Pneumonia   . PONV (postoperative nausea and vomiting)   . Shortness of breath    with exertion  . Urinary frequency   . Urinary incontinence   . Urinary urgency     Family History  Problem  Relation Age of Onset  . Kidney disease Mother   . Heart disease Mother   . Hypertension Mother   . Varicose Veins Mother   . Kidney failure Mother   . Alcohol abuse Father   . Stroke Father     several   . Hypertension Father   . Varicose Veins Father   . Alcohol abuse Brother   . Hyperlipidemia Brother   . Hypertension Brother   . Alyanah Elliott death Brother   . Varicose Veins Brother   . Heart disease Brother   . Hypertension Brother   . Arthritis Brother     Gout  . Varicose Veins Son   . Deep vein thrombosis Son     SOCIAL HISTORY: Social History   Social History  . Marital status: Widowed    Spouse name: N/A  . Number of children: N/A  . Years of education: N/A   Occupational History  . Not on file.   Social History Main Topics  . Smoking status: Never Smoker  . Smokeless tobacco: Never Used  . Alcohol use No  . Drug use: No  . Sexual activity: Not on file   Other Topics Concern  . Not on file   Social History Narrative  . No narrative on file    Allergies  Allergen Reactions  . Aspirin Other (See Comments)  Noted caused 2 holes in retina, not sure   . Codeine Hives    Current Outpatient Prescriptions  Medication Sig Dispense Refill  . allopurinol (ZYLOPRIM) 300 MG tablet Take 300 mg by mouth daily.  0  . Coenzyme Q10 (COQ10 PO) Take 1 capsule by mouth daily.    . diclofenac (VOLTAREN) 75 MG EC tablet     . fenofibrate 160 MG tablet Take 1 tablet (160 mg total) by mouth daily. 30 tablet 6  . furosemide (LASIX) 40 MG tablet Take 1 tablet (40 mg total) by mouth 2 (two) times daily. 180 tablet 3  . hydrochlorothiazide (HYDRODIURIL) 25 MG tablet     . levothyroxine (SYNTHROID, LEVOTHROID) 50 MCG tablet take 1 tablet by mouth once daily 30 tablet 6  . loratadine (CLARITIN) 10 MG tablet Take 10 mg by mouth daily.    [name] omeprazole (PRILOSEC) 20 MG capsule Take 20 mg by mouth daily.    [name] PRESCRIPTION MEDICATION Apply 1 application topically 4 (four) times  daily as needed (gout pain). Med Name: SHERTECH CREAM    . ZETIA 10 MG tablet take 1 tablet by mouth once daily 30 tablet 5   No current facility-administered medications for this visit.     REVIEW OF SYSTEMS:  [X]  denotes positive finding, [ ]  denotes negative finding Cardiac  Comments:  Chest pain or chest pressure:    Shortness of breath upon exertion: x   Short of breath when lying flat:    Irregular heart rhythm:        Vascular    Pain in calf, thigh, or hip brought on by ambulation:    Pain in feet at night that wakes you up from your sleep:  x   Blood clot in your veins:    Leg swelling:  x       Pulmonary    Oxygen at home:    Productive cough:  x   Wheezing:         Neurologic    Sudden weakness in arms or legs:     Sudden numbness in arms or legs:     Sudden onset of difficulty speaking or slurred speech:    Temporary loss of vision in one eye:     Problems with dizziness:         Gastrointestinal    Blood in stool:     Vomited blood:         Genitourinary    Burning when urinating:     Blood in urine:        Psychiatric    Major depression:         Hematologic    Bleeding problems:    Problems with blood clotting too easily:        Skin    Rashes or ulcers: x       Constitutional    Fever or chills:      PHYSICAL EXAM: Vitals:   12/19/15 1018  BP: 140/82  Pulse: 72  Resp: 20  Temp: 97.4 F (36.3 C)  TempSrc: Oral  SpO2: 99%  Weight: 216 lb (98 kg)  Height: 5\' 4"  (1.626 m)    GENERAL: The patient is a well-nourished female, in no acute distress. The vital signs are documented above. CARDIOVASCULAR: 2 Plus radial pulses bilaterally. Carotid arteries without bruits bilaterally. I do not palpate pedal pulses probably related to swelling. Feet appear to be well perfused PULMONARY: There is good air exchange  ABDOMEN: Soft and  non-tender  MUSCULOSKELETAL: There are no major deformities or cyanosis. NEUROLOGIC: No focal weakness or  paresthesias are detected. SKIN: Circumferential erythema from below the knees to the ankle level bilaterally with edema extending down onto the dorsum of the feet. Raised rash over the same area as the redness. PSYCHIATRIC: The patient has a normal affect.  DATA:  Noninvasive duplex of her vein showed no evidence of DVT and no significant deep venous reflux. She did have some reflux in her left great saphenous vein with mild dilatation.  MEDICAL ISSUES: Had long discussion with patient. This clinically appears to be bilateral lymphedema. I explained that she could have treatment of her left great saphenous vein feel that this would give her minimal if any chance for improvement since she does not have similar reflux in the right leg and has similar skin changes. I feel that this is related to a lymphedema rather than venous hypertension. I spent there is no specific treatment for this. She is unable to wear compression garments due to inability to put these on and take them off. She has tried in the past. I did explain the option of potentially wearing ace wrap around her left ankle and calf if she has progressive swelling. She will continue with elevation when possible. She will see [place] again on as-needed basis   Rosetta Posner, MD [facility] Tel [phone] Pager [phone]

## 2016-01-17 ENCOUNTER — Telehealth: Payer: Self-pay

## 2016-01-17 NOTE — Telephone Encounter (Signed)
Kristen Manning states she'll come in on 01/26/16 at 9:30 to register for PREP and will call if anything changes.

## 2016-01-26 ENCOUNTER — Telehealth: Payer: Self-pay

## 2016-01-26 NOTE — Telephone Encounter (Signed)
Due to weather conditions and appointments Kristen Manning has, she has rescheduled to enroll in PREP on 02/09/16.

## 2016-02-20 DIAGNOSIS — I739 Peripheral vascular disease, unspecified: Secondary | ICD-10-CM | POA: Diagnosis not present

## 2016-02-21 DIAGNOSIS — I872 Venous insufficiency (chronic) (peripheral): Secondary | ICD-10-CM | POA: Diagnosis not present

## 2016-02-21 DIAGNOSIS — I8311 Varicose veins of right lower extremity with inflammation: Secondary | ICD-10-CM | POA: Diagnosis not present

## 2016-02-21 DIAGNOSIS — Z85828 Personal history of other malignant neoplasm of skin: Secondary | ICD-10-CM | POA: Diagnosis not present

## 2016-02-21 DIAGNOSIS — I8312 Varicose veins of left lower extremity with inflammation: Secondary | ICD-10-CM | POA: Diagnosis not present

## 2016-02-21 DIAGNOSIS — L738 Other specified follicular disorders: Secondary | ICD-10-CM | POA: Diagnosis not present

## 2016-02-21 DIAGNOSIS — Z85820 Personal history of malignant melanoma of skin: Secondary | ICD-10-CM | POA: Diagnosis not present

## 2016-02-21 DIAGNOSIS — L820 Inflamed seborrheic keratosis: Secondary | ICD-10-CM | POA: Diagnosis not present

## 2016-02-21 DIAGNOSIS — L739 Follicular disorder, unspecified: Secondary | ICD-10-CM | POA: Diagnosis not present

## 2016-03-25 ENCOUNTER — Encounter: Payer: Medicare Other | Admitting: Family Medicine

## 2016-03-27 ENCOUNTER — Encounter: Payer: Medicare Other | Admitting: Family Medicine

## 2016-04-10 ENCOUNTER — Other Ambulatory Visit: Payer: Self-pay | Admitting: Family Medicine

## 2016-04-10 DIAGNOSIS — Z1231 Encounter for screening mammogram for malignant neoplasm of breast: Secondary | ICD-10-CM

## 2016-04-16 ENCOUNTER — Other Ambulatory Visit: Payer: Self-pay | Admitting: Family Medicine

## 2016-04-16 ENCOUNTER — Other Ambulatory Visit: Payer: Self-pay | Admitting: Cardiovascular Disease

## 2016-04-16 DIAGNOSIS — R0602 Shortness of breath: Secondary | ICD-10-CM

## 2016-04-16 DIAGNOSIS — M7989 Other specified soft tissue disorders: Secondary | ICD-10-CM

## 2016-04-16 DIAGNOSIS — I10 Essential (primary) hypertension: Secondary | ICD-10-CM

## 2016-04-16 DIAGNOSIS — E785 Hyperlipidemia, unspecified: Secondary | ICD-10-CM

## 2016-04-16 DIAGNOSIS — R079 Chest pain, unspecified: Secondary | ICD-10-CM

## 2016-05-13 ENCOUNTER — Other Ambulatory Visit: Payer: Self-pay | Admitting: Family Medicine

## 2016-05-13 ENCOUNTER — Ambulatory Visit (INDEPENDENT_AMBULATORY_CARE_PROVIDER_SITE_OTHER): Payer: Medicare Other | Admitting: Family Medicine

## 2016-05-13 ENCOUNTER — Ambulatory Visit: Payer: Medicare Other

## 2016-05-13 ENCOUNTER — Encounter: Payer: Self-pay | Admitting: Family Medicine

## 2016-05-13 VITALS — BP 130/80 | HR 93 | Temp 98.1°F | Resp 18 | Ht 64.0 in | Wt 220.2 lb

## 2016-05-13 DIAGNOSIS — J0101 Acute recurrent maxillary sinusitis: Secondary | ICD-10-CM

## 2016-05-13 MED ORDER — AMOXICILLIN 875 MG PO TABS: 875.0000 mg | ORAL_TABLET | Freq: Two times a day (BID) | ORAL | 0 refills | Status: DC

## 2016-05-13 NOTE — Progress Notes (Signed)
   Subjective:    Patient ID: AUGUSTINE BRANNICK, female    DOB: [birth year], 83 y.o.   MRN: [id number]  HPI URI- sxs started 2-3 weeks ago but 'it got worse'.  + sore throat, sinus pain, 'terrible HA', nasal congestion.  Taking Claritin and OTC mucous relief.  L ear pain.  No known sick contacts.  Intermittent cough- now having body aches.  + fatigue.  No fever.   Review of Systems For ROS see HPI     Objective:   Physical Exam  Constitutional: She is oriented to person, place, and time. She appears well-developed and well-nourished. No distress.  HENT:  Head: Normocephalic and atraumatic.  Right Ear: Tympanic membrane normal.  Left Ear: Tympanic membrane normal.  Nose: Mucosal edema and rhinorrhea present. Right sinus exhibits maxillary sinus tenderness and frontal sinus tenderness. Left sinus exhibits maxillary sinus tenderness and frontal sinus tenderness.  Mouth/Throat: Uvula is midline and mucous membranes are normal. Posterior oropharyngeal erythema present. No oropharyngeal exudate.  Eyes: Conjunctivae and EOM are normal. Pupils are equal, round, and reactive to light.  Neck: Normal range of motion. Neck supple.  Cardiovascular: Normal rate, regular rhythm and normal heart sounds.   Pulmonary/Chest: Effort normal and breath sounds normal. No respiratory distress. She has no wheezes.  Lymphadenopathy:    She has no cervical adenopathy.  Neurological: She is alert and oriented to person, place, and time.  Skin: Skin is warm and dry.  Psychiatric: She has a normal mood and affect. Her behavior is normal. Thought content normal.  Vitals reviewed.         Assessment & Plan:  Sinusitis- new.  Pt has hx of these.  sxs and PE consistent w/ infxn.  Start abx.  Reviewed supportive care and red flags that should prompt return.  Pt expressed understanding and is in agreement w/ plan.

## 2016-05-13 NOTE — Progress Notes (Signed)
Pre visit review using our clinic review tool, if applicable. No additional management support is needed unless otherwise documented below in the visit note. 

## 2016-05-13 NOTE — Patient Instructions (Signed)
Follow up as scheduled/needed Start the Amoxicillin twice daily- take w/ food Drink plenty of fluids REST! Continue the Claritin daily and the mucous relief as needed Call with any questions or concerns Hang in there!!!

## 2016-05-28 DIAGNOSIS — I890 Lymphedema, not elsewhere classified: Secondary | ICD-10-CM | POA: Diagnosis not present

## 2016-05-28 DIAGNOSIS — I739 Peripheral vascular disease, unspecified: Secondary | ICD-10-CM | POA: Diagnosis not present

## 2016-06-05 ENCOUNTER — Ambulatory Visit: Payer: Medicare Other

## 2016-06-12 ENCOUNTER — Ambulatory Visit
Admission: RE | Admit: 2016-06-12 | Discharge: 2016-06-12 | Disposition: A | Discharge status: Home / Self Care | Payer: Medicare Other | Source: Ambulatory Visit | Attending: Family Medicine | Admitting: Family Medicine

## 2016-06-12 DIAGNOSIS — Z1231 Encounter for screening mammogram for malignant neoplasm of breast: Secondary | ICD-10-CM

## 2016-06-23 ENCOUNTER — Encounter (HOSPITAL_BASED_OUTPATIENT_CLINIC_OR_DEPARTMENT_OTHER): Payer: Self-pay | Admitting: *Deleted

## 2016-06-23 ENCOUNTER — Emergency Department (HOSPITAL_BASED_OUTPATIENT_CLINIC_OR_DEPARTMENT_OTHER)
Admission: EM | Admit: 2016-06-23 | Discharge: 2016-06-23 | Disposition: A | Discharge status: Home / Self Care | Payer: Medicare Other | Attending: Emergency Medicine | Admitting: Emergency Medicine

## 2016-06-23 ENCOUNTER — Emergency Department (HOSPITAL_BASED_OUTPATIENT_CLINIC_OR_DEPARTMENT_OTHER): Payer: Medicare Other

## 2016-06-23 DIAGNOSIS — I10 Essential (primary) hypertension: Secondary | ICD-10-CM | POA: Insufficient documentation

## 2016-06-23 DIAGNOSIS — Z79899 Other long term (current) drug therapy: Secondary | ICD-10-CM | POA: Insufficient documentation

## 2016-06-23 DIAGNOSIS — E039 Hypothyroidism, unspecified: Secondary | ICD-10-CM | POA: Insufficient documentation

## 2016-06-23 DIAGNOSIS — Z7983 Long term (current) use of bisphosphonates: Secondary | ICD-10-CM | POA: Diagnosis not present

## 2016-06-23 DIAGNOSIS — Z96653 Presence of artificial knee joint, bilateral: Secondary | ICD-10-CM | POA: Insufficient documentation

## 2016-06-23 DIAGNOSIS — R0789 Other chest pain: Secondary | ICD-10-CM | POA: Diagnosis not present

## 2016-06-23 DIAGNOSIS — Z85820 Personal history of malignant melanoma of skin: Secondary | ICD-10-CM | POA: Diagnosis not present

## 2016-06-23 DIAGNOSIS — R059 Cough, unspecified: Secondary | ICD-10-CM

## 2016-06-23 DIAGNOSIS — R079 Chest pain, unspecified: Secondary | ICD-10-CM | POA: Diagnosis present

## 2016-06-23 DIAGNOSIS — R05 Cough: Secondary | ICD-10-CM

## 2016-06-23 LAB — CBC WITH DIFFERENTIAL/PLATELET
Basophils Absolute: 0 10*3/uL (ref 0.0–0.1)
Basophils Relative: 0 %
Eosinophils Absolute: 0.2 10*3/uL (ref 0.0–0.7)
Eosinophils Relative: 2 %
HCT: 37.4 % (ref 36.0–46.0)
Hemoglobin: 12.6 g/dL (ref 12.0–15.0)
Lymphocytes Relative: 20 %
Lymphs Abs: 1.8 10*3/uL (ref 0.7–4.0)
MCH: 30.4 pg (ref 26.0–34.0)
MCHC: 33.7 g/dL (ref 30.0–36.0)
MCV: 90.1 fL (ref 78.0–100.0)
Monocytes Absolute: 0.8 10*3/uL (ref 0.1–1.0)
Monocytes Relative: 9 %
Neutro Abs: 6.1 10*3/uL (ref 1.7–7.7)
Neutrophils Relative %: 69 %
Platelets: 341 10*3/uL (ref 150–400)
RBC: 4.15 MIL/uL (ref 3.87–5.11)
RDW: 14.6 % (ref 11.5–15.5)
WBC: 8.8 10*3/uL (ref 4.0–10.5)

## 2016-06-23 LAB — COMPREHENSIVE METABOLIC PANEL
ALT: 18 U/L (ref 14–54)
AST: 26 U/L (ref 15–41)
Albumin: 3.8 g/dL (ref 3.5–5.0)
Alkaline Phosphatase: 87 U/L (ref 38–126)
Anion gap: 9 (ref 5–15)
BUN: 27 mg/dL — ABNORMAL HIGH (ref 6–20)
CO2: 23 mmol/L (ref 22–32)
Calcium: 8.9 mg/dL (ref 8.9–10.3)
Chloride: 101 mmol/L (ref 101–111)
Creatinine, Ser: 1.49 mg/dL — ABNORMAL HIGH (ref 0.44–1.00)
GFR calc Af Amer: 37 mL/min — ABNORMAL LOW (ref >60)
GFR calc non Af Amer: 32 mL/min — ABNORMAL LOW (ref >60)
Glucose, Bld: 99 mg/dL (ref 65–99)
Potassium: 4.2 mmol/L (ref 3.5–5.1)
Sodium: 133 mmol/L — ABNORMAL LOW (ref 135–145)
Total Bilirubin: 0.9 mg/dL (ref 0.3–1.2)
Total Protein: 7.4 g/dL (ref 6.5–8.1)

## 2016-06-23 LAB — TROPONIN I: Troponin I: <0.03 ng/mL (ref <0.03)

## 2016-06-23 MED ORDER — DEXAMETHASONE 6 MG PO TABS
10.0000 mg | ORAL_TABLET | Freq: Once | ORAL | Status: AC
  Administered 2016-06-23: 10 mg via ORAL
  Filled 2016-06-23: qty 1

## 2016-06-23 NOTE — ED Provider Notes (Signed)
[place] DEPT [facility] Provider Note   CSN: [id number] Arrival date & time: 06/23/16  0815     History   Chief Complaint Chief Complaint  Patient presents with  . Cough    HPI Kristen Manning is a 83 y.o. female.  The history is provided by the patient. No language interpreter was used.  Cough     Kristen Manning is a 83 y.o. female who presents to the Emergency Department complaining of Chest pain/back pain. She has a history of recurrent sinusitis and has experienced nasal congestion with significant nasal discharge and facial pain for the last month. She was treated with a course of antibiotics by her PCP with partial improvement in her symptoms only to recur shortly thereafter. Over the last 3-4 days she's experienced soreness in her upper chest and upper back. It is generalized in nature and worse with coughing and worse with movement. She states that her cough is related to nasal discharge that she brings back up. She is producing clear mucus. She does have some mild shortness of breath and dyspnea on exertion that is gradually progressing. She denies any fevers, abdominal pain, vomiting, diarrhea. She has chronic bilateral lower extremity edema and it is decreased compared to her baseline.   Past Medical History:  Diagnosis Date  . Arthritis   . Bacterial infection    in lungs in Jan 2015  . Bilateral lower extremity edema   . Cancer ([place])    skin cancer, back, legs melonama  . Chronic back pain   . GERD (gastroesophageal reflux disease)    takes Omeprazole daily  . Headache(784.0)   . History of migraine    last one about 15 yrs ago  . Hyperlipidemia    takes Zetia daily  . Hypertension    takes HCTZ daily  . Joint pain   . Joint swelling   . Myocardial infarction [facility])    pt states EKG always shows infarct but no change from previous ekg;never knew anything about it  . Nocturia   . Osteoarthritis   . Pneumonia    hx of-many yrs ago  . Pneumonia   .  PONV (postoperative nausea and vomiting)   . Shortness of breath    with exertion  . Urinary frequency   . Urinary incontinence   . Urinary urgency     Patient Active Problem List   Diagnosis Date Noted  . Chest pain 08/01/2015  . Shortness of breath 03/22/2015  . H/O total knee replacement 03/20/2015  . Hypothyroidism 09/21/2014  . Cephalalgia 09/15/2014  . Gout 06/20/2014  . Skin infection 06/20/2014  . Lower leg edema 06/13/2014  . Popliteal pain 06/13/2014  . Pain in both feet 06/13/2014  . Chest wall pain 04/29/2014  . Sinusitis, acute maxillary 12/24/2013  . Meningioma ([place]) 10/07/2013  . Chronic tension-type headache, intractable 10/07/2013  . Redness-Right Leg 08/11/2013  . Pain of right lower leg 08/11/2013  . Lymphangitis, acute, lower leg 08/11/2013  . Neck pain 07/28/2013  . Swelling of limb 07/07/2013  . Headache(784.0) 07/07/2013  . Depression 06/23/2013  . Insomnia 05/26/2013  . S/P total knee arthroplasty 02/22/2013  . Arthritis of knee, right 11/26/2012  . Routine general medical examination at a health care facility 10/17/2011  . Sinusitis 09/19/2011  . HTN (hypertension) 07/03/2011  . Hyperlipidemia 07/03/2011  . Venous insufficiency, peripheral 07/03/2011    Past Surgical History:  Procedure Laterality Date  . bladder tack  1998  . BLEPHAROPLASTY Bilateral   .  JOINT REPLACEMENT Left 10/02/2009  . JOINT REPLACEMENT Right 02-22-13   Knee  . KNEE SURGERY Bilateral 2009 and 2011  . OOPHORECTOMY  1998   only one  . TONSILLECTOMY  as a child ? date  . TOTAL KNEE ARTHROPLASTY Right 02/22/2013   DR Ronnie Derby  . TOTAL KNEE ARTHROPLASTY Right 02/22/2013   Procedure: TOTAL KNEE ARTHROPLASTY;  Surgeon: Vickey Huger, MD;  Location: [place];  Service: Orthopedics;  Laterality: Right;    OB History    No data available       Home Medications    Prior to Admission medications   Medication Sig Start Date End Date Taking? Authorizing Provider  furosemide  (LASIX) 80 MG tablet Take 80 mg by mouth.   Yes [provider]  allopurinol (ZYLOPRIM) 300 MG tablet Take 300 mg by mouth daily. 06/27/15   [provider]  amoxicillin (AMOXIL) 875 MG tablet Take 1 tablet (875 mg total) by mouth 2 (two) times daily. 05/13/16   Midge Minium, MD  ezetimibe (ZETIA) 10 MG tablet take 1 tablet by mouth once daily 04/17/16   Midge Minium, MD  fenofibrate 160 MG tablet take 1 tablet by mouth once daily 05/13/16   Midge Minium, MD  levothyroxine (SYNTHROID, LEVOTHROID) 50 MCG tablet take 1 tablet by mouth once daily 11/13/15   Midge Minium, MD  loratadine (CLARITIN) 10 MG tablet Take 10 mg by mouth daily.    [provider]  omeprazole (PRILOSEC) 20 MG capsule Take 20 mg by mouth daily.    [provider]    Family History Family History  Problem Relation Age of Onset  . Kidney disease Mother   . Heart disease Mother   . Hypertension Mother   . Varicose Veins Mother   . Kidney failure Mother   . Alcohol abuse Father   . Stroke Father        several   . Hypertension Father   . Varicose Veins Father   . Alcohol abuse Brother   . Hyperlipidemia Brother   . Hypertension Brother   . Early death Brother   . Varicose Veins Brother   . Heart disease Brother   . Hypertension Brother   . Arthritis Brother        Gout  . Varicose Veins Son   . Deep vein thrombosis Son   . Breast cancer Maternal Aunt     Social History Social History  Substance Use Topics  . Smoking status: Never Smoker  . Smokeless tobacco: Never Used  . Alcohol use No     Allergies   Aspirin and Codeine   Review of Systems Review of Systems  Respiratory: Positive for cough.   All other systems reviewed and are negative.    Physical Exam Updated Vital Signs BP 139/79 (BP Location: Right Arm)   Pulse 70   Temp 97.6 F (36.4 C)   Resp 20   Ht 5\' 5"  (1.651 m)   Wt 99.8 kg (220 lb)   SpO2 99%   BMI 36.61 kg/m    Physical Exam  Constitutional: She is oriented to person, place, and time. She appears well-developed and well-nourished.  HENT:  Head: Normocephalic and atraumatic.  Boggy nasal mucous, slight rhinorrhea. Tenderness to palpation throughout the frontal maxillary sinuses bilaterally.  Cardiovascular: Normal rate and regular rhythm.   No murmur heard. Pulmonary/Chest: Effort normal. No respiratory distress.  Occasional crackle in left lung base  Abdominal: Soft. There is no  tenderness. There is no rebound and no guarding.  Musculoskeletal: She exhibits no tenderness.  2-3+ edema to bilateral lower extremities with chronic venous stasis changes to bilateral lower extremities.  Neurological: She is alert and oriented to person, place, and time.  Skin: Skin is warm and dry.  Psychiatric: She has a normal mood and affect. Her behavior is normal.  Nursing note and vitals reviewed.    ED Treatments / Results  Labs (all labs ordered are listed, but only abnormal results are displayed) Labs Reviewed  COMPREHENSIVE METABOLIC PANEL - Abnormal; Notable for the following:       Result Value   Sodium 133 (*)    BUN 27 (*)    Creatinine, Ser 1.49 (*)    GFR calc non Af Amer 32 (*)    GFR calc Af Amer 37 (*)    All other components within normal limits  CBC WITH DIFFERENTIAL/PLATELET  TROPONIN I    EKG  EKG Interpretation  Date/Time:  Sunday June 23 2016 08:55:24 EDT Ventricular Rate:  81 PR Interval:    QRS Duration: 111 QT Interval:  408 QTC Calculation: 474 R Axis:   -20 Text Interpretation:  Sinus rhythm Borderline left axis deviation Low voltage, precordial leads Baseline wander in lead(s) II III aVF V1 Confirmed by Hazle Coca [id number]) on 06/23/2016 9:22:55 AM       Radiology Dg Chest 2 View  Result Date: 06/23/2016 CLINICAL DATA:  83 year old female with a history of productive cough. EXAM: CHEST  2 VIEW COMPARISON:  01/23/2015 FINDINGS: Cardiomediastinal silhouette within  normal limits. No confluent airspace disease.  No pneumothorax or pleural effusion. Similar pattern of chronic lung changes. No displaced fracture. Multilevel degenerative changes of the spine. IMPRESSION: Chronic lung changes without evidence of superimposed acute cardiopulmonary disease Electronically Signed   By: Corrie Mckusick D.O.   On: 06/23/2016 10:05    Procedures Procedures (including critical care time)  Medications Ordered in ED Medications  dexamethasone (DECADRON) tablet 10 mg (10 mg Oral Given 06/23/16 1054)     Initial Impression / Assessment and Plan / ED Course  I have reviewed the triage vital signs and the nursing notes.  Pertinent labs & imaging results that were available during my care of the patient were reviewed by me and considered in my medical decision making (see chart for details).   patient with history of chronic and recurrent sinusitis here for evaluation of new onset of progressive chest and back pain that is worse with movement and coughing. Presentation is not consistent with ACS, PE, dissection, CHF. Pain is reproducible with activity such as twisting and bending. Labs are at her baseline and EKG with no ischemic changes. Discussed with patient likely musculoskeletal pain. Provide a one-time dose steroids due to concern for inflammation of her chest wall. Discussed home care, outpatient follow-up and return precautions.  Final Clinical Impressions(s) / ED Diagnoses   Final diagnoses:  Cough  Chest wall pain    New Prescriptions Discharge Medication List as of 06/23/2016 10:39 AM       Quintella Reichert, MD 06/23/16 1539

## 2016-06-23 NOTE — ED Notes (Signed)
ED Provider at bedside. 

## 2016-06-23 NOTE — ED Notes (Signed)
Patient transported to X-ray 

## 2016-06-23 NOTE — ED Triage Notes (Addendum)
Pt c/o  Pro cough and back pain x 1 week,  Recent dx sinusitis 6/7

## 2016-06-26 ENCOUNTER — Encounter: Payer: Self-pay | Admitting: Family Medicine

## 2016-06-26 ENCOUNTER — Ambulatory Visit (INDEPENDENT_AMBULATORY_CARE_PROVIDER_SITE_OTHER): Payer: Medicare Other | Admitting: Family Medicine

## 2016-06-26 VITALS — BP 141/84 | HR 79 | Temp 98.0°F | Resp 16 | Ht 65.0 in | Wt 217.4 lb

## 2016-06-26 DIAGNOSIS — J0101 Acute recurrent maxillary sinusitis: Secondary | ICD-10-CM

## 2016-06-26 DIAGNOSIS — M791 Myalgia, unspecified site: Secondary | ICD-10-CM

## 2016-06-26 DIAGNOSIS — E785 Hyperlipidemia, unspecified: Secondary | ICD-10-CM

## 2016-06-26 LAB — CBC WITH DIFFERENTIAL/PLATELET
Basophils Absolute: 0.1 10*3/uL (ref 0.0–0.1)
Basophils Relative: 0.4 % (ref 0.0–3.0)
Eosinophils Absolute: 0.2 10*3/uL (ref 0.0–0.7)
Eosinophils Relative: 1.5 % (ref 0.0–5.0)
HCT: 37.4 % (ref 36.0–46.0)
Hemoglobin: 12.3 g/dL (ref 12.0–15.0)
Lymphocytes Relative: 17.8 % (ref 12.0–46.0)
Lymphs Abs: 2.2 10*3/uL (ref 0.7–4.0)
MCHC: 32.8 g/dL (ref 30.0–36.0)
MCV: 90.7 fl (ref 78.0–100.0)
Monocytes Absolute: 1 10*3/uL (ref 0.1–1.0)
Monocytes Relative: 8.4 % (ref 3.0–12.0)
Neutro Abs: 8.8 10*3/uL — ABNORMAL HIGH (ref 1.4–7.7)
Neutrophils Relative %: 71.9 % (ref 43.0–77.0)
Platelets: 344 10*3/uL (ref 150.0–400.0)
RBC: 4.12 Mil/uL (ref 3.87–5.11)
RDW: 15.2 % (ref 11.5–15.5)
WBC: 12.3 10*3/uL — ABNORMAL HIGH (ref 4.0–10.5)

## 2016-06-26 LAB — HEPATIC FUNCTION PANEL
ALT: 12 U/L (ref 0–35)
AST: 16 U/L (ref 0–37)
Albumin: 3.8 g/dL (ref 3.5–5.2)
Alkaline Phosphatase: 84 U/L (ref 39–117)
Bilirubin, Direct: 0.1 mg/dL (ref 0.0–0.3)
Total Bilirubin: 0.5 mg/dL (ref 0.2–1.2)
Total Protein: 6.5 g/dL (ref 6.0–8.3)

## 2016-06-26 LAB — BASIC METABOLIC PANEL
BUN: 33 mg/dL — ABNORMAL HIGH (ref 6–23)
CO2: 28 mEq/L (ref 19–32)
Calcium: 9.4 mg/dL (ref 8.4–10.5)
Chloride: 102 mEq/L (ref 96–112)
Creatinine, Ser: 1.36 mg/dL — ABNORMAL HIGH (ref 0.40–1.20)
GFR: 39.59 mL/min — ABNORMAL LOW (ref >60.00)
Glucose, Bld: 93 mg/dL (ref 70–99)
Potassium: 4.8 mEq/L (ref 3.5–5.1)
Sodium: 137 mEq/L (ref 135–145)

## 2016-06-26 LAB — LIPID PANEL
Cholesterol: 149 mg/dL (ref 0–200)
HDL: 54.7 mg/dL
LDL Cholesterol: 72 mg/dL (ref 0–99)
NonHDL: 94.32
Total CHOL/HDL Ratio: 3
Triglycerides: 111 mg/dL (ref 0.0–149.0)
VLDL: 22.2 mg/dL (ref 0.0–40.0)

## 2016-06-26 LAB — CK: Total CK: 48 U/L (ref 7–177)

## 2016-06-26 LAB — TSH: TSH: 3.7 u[IU]/mL (ref 0.35–4.50)

## 2016-06-26 MED ORDER — AMOXICILLIN-POT CLAVULANATE 875-125 MG PO TABS: 1.0000 | ORAL_TABLET | Freq: Two times a day (BID) | ORAL | 0 refills | Status: DC

## 2016-06-26 NOTE — Assessment & Plan Note (Signed)
Chronic problem, on Zetia and fenofibrate.  Now having myalgias.  Check CK level.  Doubt that this is cholesterol medication related after all this time.  Will follow

## 2016-06-26 NOTE — Progress Notes (Signed)
   Subjective:    Patient ID: Kristen Manning, female    DOB: 12-11-1932, 83 y.o.   MRN: [id number]  HPI ER F/U- pt was seen on 6/17 for cough and pain associated w/ cough.  At time, pt was noted to have sinusitis but was not tx'd w/ abx.  Got a 1 time dose of Decadron 10mg  in ER.  Pt reports she continues to feel poorly.  + sinus pain, pressure, HA, sore throat.  Pt reports diffuse body soreness, TTP 'all over'.  No fevers.  No known sick contacts.  Hyperlipidemia- chronic problem, on Zetia and Fenofibrate.  Denies abd pain, N/V.  + Myalgias.  Review of Systems For ROS see HPI     Objective:   Physical Exam  Constitutional: She is oriented to person, place, and time. She appears well-developed and well-nourished. No distress.  HENT:  Head: Normocephalic and atraumatic.  Right Ear: Tympanic membrane normal.  Left Ear: Tympanic membrane normal.  Nose: Mucosal edema and rhinorrhea present. Right sinus exhibits maxillary sinus tenderness and frontal sinus tenderness. Left sinus exhibits maxillary sinus tenderness and frontal sinus tenderness.  Mouth/Throat: Uvula is midline and mucous membranes are normal. Posterior oropharyngeal erythema present. No oropharyngeal exudate.  Eyes: Conjunctivae and EOM are normal. Pupils are equal, round, and reactive to light.  Neck: Normal range of motion. Neck supple.  Cardiovascular: Normal rate, regular rhythm and normal heart sounds.   Pulmonary/Chest: Effort normal and breath sounds normal. No respiratory distress. She has no wheezes.  Abdominal: Soft. Bowel sounds are normal. She exhibits no distension. There is no tenderness. There is no rebound and no guarding.  Musculoskeletal: She exhibits edema (bilateral lower extremity edema) and tenderness (TTP over multiple trigger points).  Lymphadenopathy:    She has no cervical adenopathy.  Neurological: She is alert and oriented to person, place, and time.  Skin: Skin is warm and dry.  Psychiatric: She  has a normal mood and affect. Her behavior is normal. Thought content normal.          Assessment & Plan:

## 2016-06-26 NOTE — Progress Notes (Signed)
Pre visit review using our clinic review tool, if applicable. No additional management support is needed unless otherwise documented below in the visit note. 

## 2016-06-26 NOTE — Assessment & Plan Note (Signed)
Pt's sxs and PE consistent w/ infxn.  Start abx.  Reviewed supportive care and red flags that should prompt return.  Pt expressed understanding and is in agreement w/ plan.  

## 2016-06-26 NOTE — Assessment & Plan Note (Signed)
New.  Pt has TTP over multiple trigger points.  She reports pain is better than when she was seen in ER and got dose of Decadron.  Denies joint pain.  May be muscle aches due to current infxn.  Check CK, ANA.  Tylenol prn.  Reviewed supportive care and red flags that should prompt return.  Pt expressed understanding and is in agreement w/ plan.

## 2016-06-26 NOTE — Patient Instructions (Signed)
Follow up as scheduled- sooner if needed We'll notify you of your lab results and make any changes if needed Start the Augmentin twice daily- take w/ food- for the sinus infection Drink plenty of water! Take Tylenol as needed for muscle aches Call with any questions or concerns Hang in there!!!

## 2016-06-27 ENCOUNTER — Other Ambulatory Visit: Payer: Self-pay | Admitting: Family Medicine

## 2016-06-27 DIAGNOSIS — R768 Other specified abnormal immunological findings in serum: Secondary | ICD-10-CM

## 2016-06-27 LAB — ANTI-NUCLEAR AB-TITER (ANA TITER): ANA Titer 1: 1:80 {titer} — ABNORMAL HIGH

## 2016-06-27 LAB — ANA: Anti Nuclear Antibody(ANA): POSITIVE — AB

## 2016-06-27 NOTE — Progress Notes (Signed)
Called pt and lmovm to return call.

## 2016-06-28 ENCOUNTER — Telehealth: Payer: Self-pay | Admitting: Family Medicine

## 2016-06-28 MED ORDER — SULFAMETHOXAZOLE-TRIMETHOPRIM 800-160 MG PO TABS: 1.0000 | ORAL_TABLET | Freq: Two times a day (BID) | ORAL | 0 refills | Status: DC

## 2016-06-28 NOTE — Telephone Encounter (Signed)
Pt states that the Rx she is taking is upsetting her stomach and making her sick. Pt states that she is taking if with food and that is not helping, pt asking what should she do

## 2016-06-28 NOTE — Telephone Encounter (Signed)
Please make sure she is taking this w/ food.  If she cannot tolerate the medication, we can switch to Bactrim DS twice daily x10 days

## 2016-06-28 NOTE — Telephone Encounter (Signed)
Patient notified of PCP recommendations and is agreement and expresses an understanding.  

## 2016-06-28 NOTE — Telephone Encounter (Signed)
Medication filled to pharmacy as requested.   

## 2016-07-30 ENCOUNTER — Encounter: Payer: Self-pay | Admitting: Family Medicine

## 2016-07-30 ENCOUNTER — Ambulatory Visit (INDEPENDENT_AMBULATORY_CARE_PROVIDER_SITE_OTHER): Payer: Medicare Other | Admitting: Family Medicine

## 2016-07-30 VITALS — BP 133/84 | HR 86 | Temp 98.0°F | Resp 16 | Ht 65.0 in | Wt 212.4 lb

## 2016-07-30 DIAGNOSIS — Z0000 Encounter for general adult medical examination without abnormal findings: Secondary | ICD-10-CM | POA: Diagnosis not present

## 2016-07-30 DIAGNOSIS — E785 Hyperlipidemia, unspecified: Secondary | ICD-10-CM

## 2016-07-30 DIAGNOSIS — I10 Essential (primary) hypertension: Secondary | ICD-10-CM

## 2016-07-30 DIAGNOSIS — E038 Other specified hypothyroidism: Secondary | ICD-10-CM | POA: Diagnosis not present

## 2016-07-30 DIAGNOSIS — H029 Unspecified disorder of eyelid: Secondary | ICD-10-CM

## 2016-07-30 LAB — CBC WITH DIFFERENTIAL/PLATELET
Basophils Absolute: 0.1 10*3/uL (ref 0.0–0.1)
Basophils Relative: 0.8 % (ref 0.0–3.0)
Eosinophils Absolute: 0.2 10*3/uL (ref 0.0–0.7)
Eosinophils Relative: 2.2 % (ref 0.0–5.0)
HCT: 37.7 % (ref 36.0–46.0)
Hemoglobin: 12.4 g/dL (ref 12.0–15.0)
Lymphocytes Relative: 23 % (ref 12.0–46.0)
Lymphs Abs: 1.8 10*3/uL (ref 0.7–4.0)
MCHC: 32.8 g/dL (ref 30.0–36.0)
MCV: 92.7 fl (ref 78.0–100.0)
Monocytes Absolute: 0.6 10*3/uL (ref 0.1–1.0)
Monocytes Relative: 7.5 % (ref 3.0–12.0)
Neutro Abs: 5.2 10*3/uL (ref 1.4–7.7)
Neutrophils Relative %: 66.5 % (ref 43.0–77.0)
Platelets: 349 10*3/uL (ref 150.0–400.0)
RBC: 4.06 Mil/uL (ref 3.87–5.11)
RDW: 15.5 % (ref 11.5–15.5)
WBC: 7.8 10*3/uL (ref 4.0–10.5)

## 2016-07-30 LAB — BASIC METABOLIC PANEL
BUN: 20 mg/dL (ref 6–23)
CO2: 29 mEq/L (ref 19–32)
Calcium: 9.8 mg/dL (ref 8.4–10.5)
Chloride: 103 mEq/L (ref 96–112)
Creatinine, Ser: 1.36 mg/dL — ABNORMAL HIGH (ref 0.40–1.20)
GFR: 39.58 mL/min — ABNORMAL LOW (ref >60.00)
Glucose, Bld: 87 mg/dL (ref 70–99)
Potassium: 5.2 mEq/L — ABNORMAL HIGH (ref 3.5–5.1)
Sodium: 138 mEq/L (ref 135–145)

## 2016-07-30 LAB — HEPATIC FUNCTION PANEL
ALT: 13 U/L (ref 0–35)
AST: 21 U/L (ref 0–37)
Albumin: 3.8 g/dL (ref 3.5–5.2)
Alkaline Phosphatase: 81 U/L (ref 39–117)
Bilirubin, Direct: 0.2 mg/dL (ref 0.0–0.3)
Total Bilirubin: 0.7 mg/dL (ref 0.2–1.2)
Total Protein: 6.9 g/dL (ref 6.0–8.3)

## 2016-07-30 LAB — LIPID PANEL
Cholesterol: 151 mg/dL (ref 0–200)
HDL: 56.8 mg/dL (ref >39.00)
LDL Cholesterol: 76 mg/dL (ref 0–99)
NonHDL: 94.03
Total CHOL/HDL Ratio: 3
Triglycerides: 91 mg/dL (ref 0.0–149.0)
VLDL: 18.2 mg/dL (ref 0.0–40.0)

## 2016-07-30 LAB — TSH: TSH: 3.29 u[IU]/mL (ref 0.35–4.50)

## 2016-07-30 NOTE — Progress Notes (Signed)
   Subjective:    Patient ID: Kristen Manning, female    DOB: 1932-01-28, 83 y.o.   MRN: [id number]  HPI CPE- no need for colonoscopy due to age, UTD on mammo.  Declines Pneumovax.  UTD on Tdap.  Pt is down 5 lbs with healthy diet changes.  Pt needs a referral for a repeat blepharoplasty due to interference w/ vision   Review of Systems Patient reports no vision/ hearing changes, adenopathy,fever, weight change,  persistant/recurrent hoarseness , swallowing issues, chest pain, palpitations, edema, persistant/recurrent cough, hemoptysis, dyspnea (rest/exertional/paroxysmal nocturnal), gastrointestinal bleeding (melena, rectal bleeding), abdominal pain, significant heartburn, bowel changes, GU symptoms (dysuria, hematuria, incontinence), Gyn symptoms (abnormal  bleeding, pain),  syncope, focal weakness, memory loss, numbness & tingling, skin/hair/nail changes, abnormal bruising or bleeding, anxiety, or depression.     Objective:   Physical Exam General Appearance:    Alert, cooperative, no distress, appears stated age  Head:    Normocephalic, without obvious abnormality, atraumatic  Eyes:    PERRL, conjunctiva/corneas clear, EOM's intact, fundi    benign, both eyes  Ears:    Normal TM's and external ear canals, both ears  Nose:   Nares normal, septum midline, mucosa normal, no drainage    or sinus tenderness  Throat:   Lips, mucosa, and tongue normal; teeth and gums normal  Neck:   Supple, symmetrical, trachea midline, no adenopathy;    Thyroid: no enlargement/tenderness/nodules  Back:     Symmetric, no curvature, ROM normal, no CVA tenderness  Lungs:     Clear to auscultation bilaterally, respirations unlabored  Chest Wall:    No tenderness or deformity   Heart:    Regular rate and rhythm, S1 and S2 normal, no murmur, rub   or gallop  Breast Exam:    Deferred to mammo  Abdomen:     Soft, non-tender, bowel sounds active all four quadrants,    no masses, no organomegaly  Genitalia:     Deferred  Rectal:    Extremities:   Extremities normal, atraumatic, 1-2+ edema of LEs bilaterally w/ chronic venous stasis changes  Pulses:   2+ and symmetric all extremities  Skin:   Skin color, texture, turgor normal, no rashes or lesions  Lymph nodes:   Cervical, supraclavicular, and axillary nodes normal  Neurologic:   CNII-XII intact, normal strength, sensation and reflexes    throughout          Assessment & Plan:

## 2016-07-30 NOTE — Patient Instructions (Addendum)
Schedule your Medicare Wellness Visit w/ Kim at your convenience Follow up with me in 6 months We'll notify you of your lab results and make any changes if needed Your appt is w/ Dr Estanislado Pandy at [facility] (near [facility]) Continue to work on [employer] and regular exercise- you're doing great!!! I put in the referral for the eye surgeon- they should call you Call with any questions or concerns Have a great summer!!!

## 2016-07-30 NOTE — Progress Notes (Signed)
Pre visit review using our clinic review tool, if applicable. No additional management support is needed unless otherwise documented below in the visit note. 

## 2016-07-31 ENCOUNTER — Encounter: Payer: Self-pay | Admitting: General Practice

## 2016-07-31 ENCOUNTER — Ambulatory Visit (INDEPENDENT_AMBULATORY_CARE_PROVIDER_SITE_OTHER): Payer: Medicare Other | Admitting: Cardiovascular Disease

## 2016-07-31 ENCOUNTER — Encounter: Payer: Self-pay | Admitting: Cardiovascular Disease

## 2016-07-31 DIAGNOSIS — E7800 Pure hypercholesterolemia, unspecified: Secondary | ICD-10-CM

## 2016-07-31 DIAGNOSIS — R600 Localized edema: Secondary | ICD-10-CM | POA: Diagnosis not present

## 2016-07-31 DIAGNOSIS — I10 Essential (primary) hypertension: Secondary | ICD-10-CM

## 2016-07-31 NOTE — Patient Instructions (Signed)

## 2016-07-31 NOTE — Assessment & Plan Note (Signed)
Chronic problem.  Adequate control today.  Check labs.  No anticipated med changes.  Will follow.

## 2016-07-31 NOTE — Assessment & Plan Note (Signed)
History of essential hypertension with blood pressure measured 130/70. She is on no antihypertensive medications.

## 2016-07-31 NOTE — Assessment & Plan Note (Signed)
Chronic problem.  Currently asymptomatic.  Check labs.  Adjust meds prn  

## 2016-07-31 NOTE — Assessment & Plan Note (Signed)
Pt's PE unchanged from previous and WNL w/ exception of obesity and venous stasis changes due to chronic edema.  UTD on mammo, no need for colonoscopy.  Declines Pneumovax.  Check labs.  Anticipatory guidance provided.

## 2016-07-31 NOTE — Assessment & Plan Note (Signed)
History of hyperlipidemia on fenofibrate and Zetia with recent lipid profile performed 07/30/16 revealing a total cholesterol of 151, LDL 76 and HDL 56. She is statin intolerant.

## 2016-07-31 NOTE — Assessment & Plan Note (Signed)
Chronic problem.  On Zetia and Fenofibrate due to statin intolerance.  Check labs.  Adjust meds prn

## 2016-07-31 NOTE — Assessment & Plan Note (Signed)
Chronic lower extremity edema on furosemide. She did have venous reflux studies performed by Dr. Donnetta Hutching which were positive for bilateral venous reflux

## 2016-07-31 NOTE — Progress Notes (Signed)
07/31/2016 Kristen Manning   October 16, 1932  [id number]  Primary Physician Kristen Minium, MD Primary Cardiologist: Kristen Harp MD Kristen Manning  HPI:  Kristen Manning is a 83 year old moderate to severely overweight widowed Caucasian female mother of 13, grandmother of her grandchildren referred by Kristen Manning for cardiovascular evaluation. I last saw her in the office 08/01/15 . Her brother Kristen Manning is also a patient here. Has a history of treated hypertension and hyperlipidemia. She has had bilateral total knee replacements and has gout She received pneumonia and flu vaccine in the fall of last year. Should she suddenly developed pneumonia and the flow. She has complained of increasing lower extremity edema and dyspnea on exertion with occasional chest pain. I doubled her Lasix to twice a day which resulted in marked improvement in her edema. Since I saw her a year ago she's remained clinically stable. She still has chronic bilateral lower extremity edema on oral diuretics. A venous reflux study apparently was positive for venous reflux.    Current Outpatient Prescriptions  Medication Sig Dispense Refill  . allopurinol (ZYLOPRIM) 300 MG tablet Take 300 mg by mouth daily.  0  . ezetimibe (ZETIA) 10 MG tablet take 1 tablet by mouth once daily 30 tablet 5  . fenofibrate 160 MG tablet take 1 tablet by mouth once daily 30 tablet 6  . furosemide (LASIX) 80 MG tablet Take 80 mg by mouth.    . levothyroxine (SYNTHROID, LEVOTHROID) 50 MCG tablet take 1 tablet by mouth once daily 30 tablet 6  . loratadine (CLARITIN) 10 MG tablet Take 10 mg by mouth daily.    [name] omeprazole (PRILOSEC) 20 MG capsule Take 20 mg by mouth daily.     No current facility-administered medications for this visit.     Allergies  Allergen Reactions  . Aspirin Other (See Comments)    Noted caused 2 holes in retina, not sure   . Codeine Hives    Social History   Social History  . Marital status:  Widowed    Spouse name: N/A  . Number of children: N/A  . Years of education: N/A   Occupational History  . Not on file.   Social History Main Topics  . Smoking status: Never Smoker  . Smokeless tobacco: Never Used  . Alcohol use No  . Drug use: No  . Sexual activity: Not on file   Other Topics Concern  . Not on file   Social History Narrative  . No narrative on file     Review of Systems: General: negative for chills, fever, night sweats or weight changes.  Cardiovascular: negative for chest pain, dyspnea on exertion, edema, orthopnea, palpitations, paroxysmal nocturnal dyspnea or shortness of breath Dermatological: negative for rash Respiratory: negative for cough or wheezing Urologic: negative for hematuria Abdominal: negative for nausea, vomiting, diarrhea, bright red blood per rectum, melena, or hematemesis Neurologic: negative for visual changes, syncope, or dizziness All other systems reviewed and are otherwise negative except as noted above.    Blood pressure 138/70, pulse 82, height 5\' 5"  (1.651 m), weight 213 lb (96.6 kg).  General appearance: alert and no distress Neck: no adenopathy, no carotid bruit, no JVD, supple, symmetrical, trachea midline and thyroid not enlarged, symmetric, no tenderness/mass/nodules Lungs: clear to auscultation bilaterally Heart: regular rate and rhythm, S1, S2 normal, no murmur, click, rub or gallop Extremities: 2-3+ bilateral lower extremity edema.  EKG not performed today  ASSESSMENT AND PLAN:  HTN (hypertension) History of essential hypertension with blood pressure measured 130/70. She is on no antihypertensive medications.  Hyperlipidemia History of hyperlipidemia on fenofibrate and Zetia with recent lipid profile performed 07/30/16 revealing a total cholesterol of 151, LDL 76 and HDL 56. She is statin intolerant.  Lower leg edema Chronic lower extremity edema on furosemide. She did have venous reflux studies performed by  Kristen Manning which were positive for bilateral venous reflux      Kristen Harp MD [facility], [facility] 07/31/2016 10:27 AM

## 2016-09-02 DIAGNOSIS — H0230 Blepharochalasis unspecified eye, unspecified eyelid: Secondary | ICD-10-CM | POA: Diagnosis not present

## 2016-09-02 DIAGNOSIS — R768 Other specified abnormal immunological findings in serum: Secondary | ICD-10-CM | POA: Insufficient documentation

## 2016-09-02 NOTE — Progress Notes (Signed)
Office Visit Note  Patient: Kristen Manning             Date of Birth: December 18, 1932           MRN: [id number]             PCP: Midge Minium, MD Referring: Midge Minium, MD Visit Date: 09/04/2016 Occupation: Retired LPN    Subjective:  New Patient (Initial Visit) +ANA   History of Present Illness: Kristen Manning is a 83 y.o. female seen in consultation per request of her PCP. According to patient she has had arthritis for multiple years. She gives history of osteoarthritis involving her knee joints for more than 20 years. She had bilateral total knee replacement over the last few years. She still continues to have some discomfort in her knee joints. She states she has had this disease of her lumbar spine for more than 40 years I do not have any records confirming that. She was diagnosed with gout about a year ago and had been on allopurinol. She states her feet is still hurt at night and she uses some topical agents for that. Patient reports about 2 months ago she woke up with severe pain in her neck and lower back and abdominal region and went to the emergency room where she was given pain medications and had some lab work. For the ongoing neck and back and abdominal pain she was seen by Dr. Birdie Riddle as well. She's been seeing her cardiologist on regular basis for ongoing shortness of breath. She gives history of allergies and productive cough. She's been having discomfort in her hands and her feet. She has history of venous insufficiency which causes pedal edema and discoloration on lower extremities she also complains of cramps in her bilateral lower extremities.  Activities of Daily Living:  Patient reports morning stiffness for 1 hour.   Patient Reports nocturnal pain.  Difficulty dressing/grooming: Denies Difficulty climbing stairs: Reports Difficulty getting out of chair: Reports Difficulty using hands for taps, buttons, cutlery, and/or writing: Denies   Review of  Systems  Constitutional: Positive for fatigue. Negative for night sweats, weight gain, weight loss and weakness.  HENT: Positive for mouth dryness. Negative for mouth sores, trouble swallowing, trouble swallowing and nose dryness.   Eyes: Positive for dryness. Negative for pain, redness and visual disturbance.  Respiratory: Positive for shortness of breath. Negative for cough and difficulty breathing.   Cardiovascular: Positive for hypertension. Negative for chest pain, palpitations, irregular heartbeat and swelling in legs/feet.  Gastrointestinal: Positive for constipation. Negative for blood in stool and diarrhea.  Endocrine: Negative for increased urination.  Genitourinary: Positive for nocturia. Negative for vaginal dryness.  Musculoskeletal: Positive for arthralgias, joint pain, myalgias, morning stiffness and myalgias. Negative for joint swelling, muscle weakness and muscle tenderness.  Skin: Positive for rash. Negative for color change, hair loss, skin tightness, ulcers and sensitivity to sunlight.  Allergic/Immunologic: Negative for susceptible to infections.  Neurological: Negative for dizziness, memory loss and night sweats.  Hematological: Negative for swollen glands.  Psychiatric/Behavioral: Negative for depressed mood and sleep disturbance. The patient is not nervous/anxious.     PMFS History:  Patient Active Problem List   Diagnosis Date Noted  . ANA positive 09/02/2016  . Myalgia 06/26/2016  . Chest pain 08/01/2015  . Shortness of breath 03/22/2015  . History of total knee replacement, bilateral 03/20/2015  . Hypothyroidism 09/21/2014  . Cephalalgia 09/15/2014  . Gout 06/20/2014  . Skin infection 06/20/2014  .  Lower leg edema 06/13/2014  . Popliteal pain 06/13/2014  . Pain in both feet 06/13/2014  . Chest wall pain 04/29/2014  . Sinusitis, acute maxillary 12/24/2013  . Meningioma ([place]) 10/07/2013  . Chronic tension-type headache, intractable 10/07/2013  .  Redness-Right Leg 08/11/2013  . Pain of right lower leg 08/11/2013  . Lymphangitis, acute, lower leg 08/11/2013  . Neck pain 07/28/2013  . Swelling of limb 07/07/2013  . Headache(784.0) 07/07/2013  . Depression 06/23/2013  . Insomnia 05/26/2013  . Arthritis of knee, right 11/26/2012  . Routine general medical examination at a health care facility 10/17/2011  . Sinusitis 09/19/2011  . HTN (hypertension) 07/03/2011  . Hyperlipidemia 07/03/2011  . Venous insufficiency, peripheral 07/03/2011    Past Medical History:  Diagnosis Date  . Arthritis   . Bacterial infection    in lungs in Jan 2015  . Bilateral lower extremity edema   . Cancer ([place])    skin cancer, back, legs melonama  . Chronic back pain   . GERD (gastroesophageal reflux disease)    takes Omeprazole daily  . Headache(784.0)   . History of migraine    last one about 15 yrs ago  . Hyperlipidemia    takes Zetia daily  . Hypertension    takes HCTZ daily  . Joint pain   . Joint swelling   . Myocardial infarction [facility])    pt states EKG always shows infarct but no change from previous ekg;never knew anything about it  . Nocturia   . Osteoarthritis   . Pneumonia    hx of-many yrs ago  . Pneumonia   . PONV (postoperative nausea and vomiting)   . Shortness of breath    with exertion  . Urinary frequency   . Urinary incontinence   . Urinary urgency     Family History  Problem Relation Age of Onset  . Kidney disease Mother   . Heart disease Mother   . Hypertension Mother   . Varicose Veins Mother   . Kidney failure Mother   . Alcohol abuse Father   . Stroke Father        several   . Hypertension Father   . Varicose Veins Father   . Alcohol abuse Brother   . Hyperlipidemia Brother   . Hypertension Brother   . Early death Brother   . Varicose Veins Brother   . Heart disease Brother   . Hypertension Brother   . Arthritis Brother        Gout  . Varicose Veins Son   . Deep vein thrombosis Son   . Breast  cancer Maternal Aunt    Past Surgical History:  Procedure Laterality Date  . bladder tack  1998  . BLEPHAROPLASTY Bilateral   . JOINT REPLACEMENT Left 10/02/2009  . JOINT REPLACEMENT Right 02-22-13   Knee  . KNEE SURGERY Bilateral 2009 and 2011  . OOPHORECTOMY  1998   only one  . TONSILLECTOMY  as a child ? date  . TOTAL KNEE ARTHROPLASTY Right 02/22/2013   DR Ronnie Derby  . TOTAL KNEE ARTHROPLASTY Right 02/22/2013   Procedure: TOTAL KNEE ARTHROPLASTY;  Surgeon: Vickey Huger, MD;  Location: [place];  Service: Orthopedics;  Laterality: Right;   Social History   Social History Narrative  . No narrative on file     Objective: Vital Signs: BP 130/74 (BP Location: Right Arm)   Pulse 78   Resp 16   Ht 5\' 5"  (1.651 m)   Wt 218 lb (98.9  kg)   BMI 36.28 kg/m    Physical Exam  Constitutional: She is oriented to person, place, and time. She appears well-developed and well-nourished.  HENT:  Head: Normocephalic and atraumatic.  Eyes: Conjunctivae and EOM are normal.  Neck: Normal range of motion.  Cardiovascular: Normal rate, regular rhythm, normal heart sounds and intact distal pulses.   Pitting edema over bilateral lower extremities  Pulmonary/Chest: Effort normal. She has wheezes.  Abdominal: Soft. Bowel sounds are normal.  Lymphadenopathy:    She has no cervical adenopathy.  Neurological: She is alert and oriented to person, place, and time.  Skin: Skin is warm and dry. Capillary refill takes less than 2 seconds. There is erythema.  Over bilateral lower extremities  Psychiatric: She has a normal mood and affect. Her behavior is normal.  Nursing note and vitals reviewed.    Musculoskeletal Exam: C-spine good range of motion. Thoracic and lumbar spine limited painful range of motion. Shoulder joints elbow joints wrist joints are good range of motion. She has some DIP PIP thickening but no synovitis was noted in her hands. Hip joints are good range of motion. She has bilateral total  knee replacement. She had tenderness across MTP joints but no synovitis was noted. DIP PIP thickening was noted.  CDAI Exam: No CDAI exam completed.    Investigation: Findings:  06/26/2016 ANA positive 1:80 titer homogenous, CK , Lipid panel, and TSH normal    CBC Latest Ref Rng & Units 07/30/2016 06/26/2016 06/23/2016  WBC 4.0 - 10.5 K/uL 7.8 12.3(H) 8.8  Hemoglobin 12.0 - 15.0 g/dL 12.4 12.3 12.6  Hematocrit 36.0 - 46.0 % 37.7 37.4 37.4  Platelets 150.0 - 400.0 K/uL 349.0 344.0 341   CMP Latest Ref Rng & Units 07/30/2016 06/26/2016 06/23/2016  Glucose 70 - 99 mg/dL 87 93 99  BUN 6 - 23 mg/dL 20 33(H) 27(H)  Creatinine 0.40 - 1.20 mg/dL 1.36(H) 1.36(H) 1.49(H)  Sodium 135 - 145 mEq/L 138 137 133(L)  Potassium 3.5 - 5.1 mEq/L 5.2(H) 4.8 4.2  Chloride 96 - 112 mEq/L 103 102 101  CO2 19 - 32 mEq/L 29 28 23   Calcium 8.4 - 10.5 mg/dL 9.8 9.4 8.9  Total Protein 6.0 - 8.3 g/dL 6.9 6.5 7.4  Total Bilirubin 0.2 - 1.2 mg/dL 0.7 0.5 0.9  Alkaline Phos 39 - 117 U/L 81 84 87  AST 0 - 37 U/L 21 16 26   ALT 0 - 35 U/L 13 12 18     Imaging: Xr Foot 2 Views Left  Result Date: 09/04/2016 PIP/DIP narrowing and first MTP narrowing was noted. A small inferior and posterior calcaneal spur was noted. Impression these findings are consistent with osteoarthritis of the foot.  Xr Foot 2 Views Right  Result Date: 09/04/2016 PIP/DIP narrowing and first MTP narrowing was noted. A small inferior and posterior calcaneal spur was noted. Impression these findings are consistent with osteoarthritis of the foot.  Xr Hand 2 View Left  Result Date: 09/04/2016 PIP/DIP and CMC narrowing was noted. No MCP or intercarpal joint space narrowing was noted. No erosive changes were noted. These findings are consistent with osteoarthritis of the  hand.  Xr Hand 2 View Right  Result Date: 09/04/2016 PIP/DIP and CMC narrowing was noted. No MCP or intercarpal joint space narrowing was noted. No erosive changes were noted.  These findings are consistent with osteoarthritis of the  hand.  Xr Lumbar Spine 2-3 Views  Result Date: 09/04/2016 Multilevel spondylosis was noted with facet joint arthropathy. L3-4  L5 narrowing was noted. Calcification of aorta was also noted. Impression: Multilevel spondylosis and facet joint arthropathy.   Speciality Comments: No specialty comments available.    Procedures:  No procedures performed Allergies: Aspirin and Codeine   Assessment / Plan:     Visit Diagnoses: ANA positive - ANA 1:80 NH. Patient has no clinical features of some autoimmune disease on examination. She gives history of sicca symptoms which appears to be related to medications. I will obtain following labs to evaluate this further.  Neck pain: Chronic  Pain in both hands . Patient gives history of frequent swelling in her hands. I do not see any swelling on examination no synovitis was noted. - Plan: XR Hand 2 View Right, XR Hand 2 View Left. The x-rays are consistent with osteoarthritis of bilateral hands.  Pain bilateral feet: She appears to have some osteoarthritic changes in her feet. Plan: X-ray feet 2 views left and right. The x-rays revealed osteoarthritis of bilateral feet.  Lumbar pain - Plan: XR Lumbar Spine 2-3 Views . She's been having increased lower back pain as well. She gives history of disc disease in the past. I do not have any x-rays to review. The x-rays obtained today showed multilevel spondylosis and facet joint arthropathy. I offered physical therapy but patient declined.  Idiopathic chronic gout of multiple sites without tophus - on Allopurinol 300 mg po qd. Patient gives history of ongoing feet pain.  History of total knee replacement, bilateral: Chronic pain  Primary insomnia: She states she's been sleeping quite well on current medications  Myalgia: She complains of muscle spasms at nighttime.  History of hypothyroidism  History of hyperlipidemia  Venous insufficiency,  peripheral: She has some lower extremity edema and erythema from the stasis.  Orders: Orders Placed This Encounter  Procedures  . XR Foot 2 Views Left  . XR Foot 2 Views Right  . XR Lumbar Spine 2-3 Views  . XR Hand 2 View Right  . XR Hand 2 View Left  . Urinalysis, Routine w reflex microscopic  . Sedimentation rate  . Antinuclear Antib (ANA)  . [id number] ENA PANEL  . C3 and C4  . Beta-2 glycoprotein antibodies  . Cardiolipin antibodies, IgG, IgM, IgA  . Lupus anticoagulant panel  . Uric acid  . Rheumatoid Factor  . Cyclic citrul peptide antibody, IgG   No orders of the defined types were placed in this encounter.   Face-to-face time spent with patient was 50 minutes. Greater than 50% of time was spent in counseling and coordination of care.  Follow-Up Instructions: Return for Osteoarthritis, Gout, +ANA.   Bo Merino, MD  Note - This record has been created using Editor, commissioning.  Chart creation errors have been sought, but may not always  have been located. Such creation errors do not reflect on  the standard of medical care.

## 2016-09-04 ENCOUNTER — Ambulatory Visit (INDEPENDENT_AMBULATORY_CARE_PROVIDER_SITE_OTHER): Payer: Self-pay

## 2016-09-04 ENCOUNTER — Encounter: Payer: Self-pay | Admitting: Rheumatology

## 2016-09-04 ENCOUNTER — Ambulatory Visit (INDEPENDENT_AMBULATORY_CARE_PROVIDER_SITE_OTHER): Payer: Medicaid Other | Admitting: Rheumatology

## 2016-09-04 VITALS — BP 130/74 | HR 78 | Resp 16 | Ht 65.0 in | Wt 218.0 lb

## 2016-09-04 DIAGNOSIS — M1A09X0 Idiopathic chronic gout, multiple sites, without tophus (tophi): Secondary | ICD-10-CM | POA: Diagnosis not present

## 2016-09-04 DIAGNOSIS — M791 Myalgia, unspecified site: Secondary | ICD-10-CM

## 2016-09-04 DIAGNOSIS — M545 Low back pain, unspecified: Secondary | ICD-10-CM

## 2016-09-04 DIAGNOSIS — M5136 Other intervertebral disc degeneration, lumbar region: Secondary | ICD-10-CM

## 2016-09-04 DIAGNOSIS — F5101 Primary insomnia: Secondary | ICD-10-CM

## 2016-09-04 DIAGNOSIS — I872 Venous insufficiency (chronic) (peripheral): Secondary | ICD-10-CM

## 2016-09-04 DIAGNOSIS — M79671 Pain in right foot: Secondary | ICD-10-CM

## 2016-09-04 DIAGNOSIS — M2550 Pain in unspecified joint: Secondary | ICD-10-CM | POA: Diagnosis not present

## 2016-09-04 DIAGNOSIS — M79672 Pain in left foot: Secondary | ICD-10-CM

## 2016-09-04 DIAGNOSIS — R768 Other specified abnormal immunological findings in serum: Secondary | ICD-10-CM | POA: Diagnosis not present

## 2016-09-04 DIAGNOSIS — Z96653 Presence of artificial knee joint, bilateral: Secondary | ICD-10-CM

## 2016-09-04 DIAGNOSIS — M79661 Pain in right lower leg: Secondary | ICD-10-CM

## 2016-09-04 DIAGNOSIS — M79642 Pain in left hand: Secondary | ICD-10-CM

## 2016-09-04 DIAGNOSIS — M79641 Pain in right hand: Secondary | ICD-10-CM | POA: Diagnosis not present

## 2016-09-04 DIAGNOSIS — M542 Cervicalgia: Secondary | ICD-10-CM

## 2016-09-04 DIAGNOSIS — Z8639 Personal history of other endocrine, nutritional and metabolic disease: Secondary | ICD-10-CM

## 2016-09-04 NOTE — Patient Instructions (Signed)

## 2016-09-05 LAB — URINALYSIS, MICROSCOPIC ONLY
Bacteria, UA: NONE SEEN [HPF]
Casts: NONE SEEN [LPF]
Crystals: NONE SEEN [HPF]
Yeast: NONE SEEN [HPF]

## 2016-09-05 LAB — CP5000020 ENA PANEL
ENA SM Ab Ser-aCnc: <1
Ribonucleic Protein(ENA) Antibody, IgG: <1
SSA (Ro) (ENA) Antibody, IgG: <1
SSB (La) (ENA) Antibody, IgG: <1
Scleroderma (Scl-70) (ENA) Antibody, IgG: <1
ds DNA Ab: <1 IU/mL

## 2016-09-05 LAB — URINALYSIS, ROUTINE W REFLEX MICROSCOPIC
Bilirubin Urine: NEGATIVE
Glucose, UA: NEGATIVE
Hgb urine dipstick: NEGATIVE
Ketones, ur: NEGATIVE
Nitrite: NEGATIVE
Protein, ur: NEGATIVE
Specific Gravity, Urine: 1.014 (ref 1.001–1.035)
pH: 7.5 (ref 5.0–8.0)

## 2016-09-05 LAB — RHEUMATOID FACTOR: Rhuematoid fact SerPl-aCnc: <14 IU/mL (ref <14)

## 2016-09-05 LAB — ANA: Anti Nuclear Antibody(ANA): NEGATIVE

## 2016-09-05 LAB — C3 AND C4
C3 Complement: 169 mg/dL
C4 Complement: 24 mg/dL

## 2016-09-05 LAB — CARDIOLIPIN ANTIBODIES, IGG, IGM, IGA
Anticardiolipin IgA: <11 [APL'U]
Anticardiolipin IgG: <14 [GPL'U]
Anticardiolipin IgM: 70 [MPL'U] — ABNORMAL HIGH

## 2016-09-05 LAB — CYCLIC CITRUL PEPTIDE ANTIBODY, IGG: Cyclic Citrullin Peptide Ab: <16 Units

## 2016-09-05 LAB — SEDIMENTATION RATE: Sed Rate: 32 mm/hr — ABNORMAL HIGH (ref 0–30)

## 2016-09-05 LAB — URIC ACID: Uric Acid, Serum: 5.7 mg/dL (ref 2.5–7.0)

## 2016-09-06 ENCOUNTER — Emergency Department (HOSPITAL_BASED_OUTPATIENT_CLINIC_OR_DEPARTMENT_OTHER): Payer: Medicare Other

## 2016-09-06 ENCOUNTER — Emergency Department (HOSPITAL_BASED_OUTPATIENT_CLINIC_OR_DEPARTMENT_OTHER)
Admission: EM | Admit: 2016-09-06 | Discharge: 2016-09-06 | Disposition: A | Discharge status: Home / Self Care | Payer: Medicare Other | Attending: Emergency Medicine | Admitting: Emergency Medicine

## 2016-09-06 ENCOUNTER — Encounter (HOSPITAL_BASED_OUTPATIENT_CLINIC_OR_DEPARTMENT_OTHER): Payer: Self-pay

## 2016-09-06 DIAGNOSIS — R1084 Generalized abdominal pain: Secondary | ICD-10-CM

## 2016-09-06 DIAGNOSIS — G8929 Other chronic pain: Secondary | ICD-10-CM

## 2016-09-06 DIAGNOSIS — E039 Hypothyroidism, unspecified: Secondary | ICD-10-CM | POA: Diagnosis not present

## 2016-09-06 DIAGNOSIS — K5730 Diverticulosis of large intestine without perforation or abscess without bleeding: Secondary | ICD-10-CM | POA: Diagnosis not present

## 2016-09-06 DIAGNOSIS — R110 Nausea: Secondary | ICD-10-CM | POA: Diagnosis not present

## 2016-09-06 DIAGNOSIS — M546 Pain in thoracic spine: Secondary | ICD-10-CM | POA: Insufficient documentation

## 2016-09-06 DIAGNOSIS — R05 Cough: Secondary | ICD-10-CM | POA: Diagnosis not present

## 2016-09-06 DIAGNOSIS — I10 Essential (primary) hypertension: Secondary | ICD-10-CM | POA: Insufficient documentation

## 2016-09-06 DIAGNOSIS — Z96653 Presence of artificial knee joint, bilateral: Secondary | ICD-10-CM | POA: Insufficient documentation

## 2016-09-06 DIAGNOSIS — M549 Dorsalgia, unspecified: Secondary | ICD-10-CM | POA: Diagnosis present

## 2016-09-06 DIAGNOSIS — Z85828 Personal history of other malignant neoplasm of skin: Secondary | ICD-10-CM | POA: Insufficient documentation

## 2016-09-06 LAB — URINALYSIS, ROUTINE W REFLEX MICROSCOPIC
Bilirubin Urine: NEGATIVE
Glucose, UA: NEGATIVE mg/dL
Ketones, ur: NEGATIVE mg/dL
Nitrite: NEGATIVE
Protein, ur: NEGATIVE mg/dL
Specific Gravity, Urine: <1.005 — ABNORMAL LOW (ref 1.005–1.030)
pH: 7 (ref 5.0–8.0)

## 2016-09-06 LAB — BETA-2 GLYCOPROTEIN ANTIBODIES
Beta-2 Glyco I IgG: <9 SGU (ref <=20)
Beta-2-Glycoprotein I IgA: <9 SAU (ref <=20)
Beta-2-Glycoprotein I IgM: 9 SMU (ref <=20)

## 2016-09-06 LAB — COMPREHENSIVE METABOLIC PANEL
ALT: 17 U/L (ref 14–54)
AST: 25 U/L (ref 15–41)
Albumin: 4 g/dL (ref 3.5–5.0)
Alkaline Phosphatase: 90 U/L (ref 38–126)
Anion gap: 10 (ref 5–15)
BUN: 24 mg/dL — ABNORMAL HIGH (ref 6–20)
CO2: 26 mmol/L (ref 22–32)
Calcium: 9 mg/dL (ref 8.9–10.3)
Chloride: 101 mmol/L (ref 101–111)
Creatinine, Ser: 1.34 mg/dL — ABNORMAL HIGH (ref 0.44–1.00)
GFR calc Af Amer: 42 mL/min — ABNORMAL LOW (ref >60)
GFR calc non Af Amer: 36 mL/min — ABNORMAL LOW (ref >60)
Glucose, Bld: 95 mg/dL (ref 65–99)
Potassium: 3.9 mmol/L (ref 3.5–5.1)
Sodium: 137 mmol/L (ref 135–145)
Total Bilirubin: 0.5 mg/dL (ref 0.3–1.2)
Total Protein: 7.5 g/dL (ref 6.5–8.1)

## 2016-09-06 LAB — CBC WITH DIFFERENTIAL/PLATELET
Basophils Absolute: 0 10*3/uL (ref 0.0–0.1)
Basophils Relative: 0 %
Eosinophils Absolute: 0.2 10*3/uL (ref 0.0–0.7)
Eosinophils Relative: 2 %
HCT: 35.8 % — ABNORMAL LOW (ref 36.0–46.0)
Hemoglobin: 11.8 g/dL — ABNORMAL LOW (ref 12.0–15.0)
Lymphocytes Relative: 23 %
Lymphs Abs: 2.1 10*3/uL (ref 0.7–4.0)
MCH: 30.1 pg (ref 26.0–34.0)
MCHC: 33 g/dL (ref 30.0–36.0)
MCV: 91.3 fL (ref 78.0–100.0)
Monocytes Absolute: 0.9 10*3/uL (ref 0.1–1.0)
Monocytes Relative: 10 %
Neutro Abs: 5.8 10*3/uL (ref 1.7–7.7)
Neutrophils Relative %: 65 %
Platelets: 330 10*3/uL (ref 150–400)
RBC: 3.92 MIL/uL (ref 3.87–5.11)
RDW: 14.6 % (ref 11.5–15.5)
WBC: 9 10*3/uL (ref 4.0–10.5)

## 2016-09-06 LAB — RFX DRVVT SCR W/RFLX CONF 1:1 MIX: dRVVT Screen: 39 s (ref <=45)

## 2016-09-06 LAB — URINALYSIS, MICROSCOPIC (REFLEX): RBC / HPF: NONE SEEN RBC/hpf (ref 0–5)

## 2016-09-06 LAB — LUPUS ANTICOAGULANT PANEL

## 2016-09-06 LAB — LIPASE, BLOOD: Lipase: 31 U/L (ref 11–51)

## 2016-09-06 LAB — RFX PTT-LA W/RFX TO HEX PHASE CONF: PTT-LA Screen: 43 s — ABNORMAL HIGH (ref <=40)

## 2016-09-06 LAB — RFLX HEXAGONAL PHASE CONFIRM: Hexagonal Phase Confirm: NEGATIVE

## 2016-09-06 MED ORDER — IOPAMIDOL (ISOVUE-300) INJECTION 61%
100.0000 mL | Freq: Once | INTRAVENOUS | Status: AC | PRN
  Administered 2016-09-06: 80 mL via INTRAVENOUS

## 2016-09-06 NOTE — ED Provider Notes (Signed)
[facility] DEPT [facility] Provider Note   CSN: [id number] Arrival date & time: 09/06/16  1519     History   Chief Complaint Chief Complaint  Patient presents with  . Back Pain    HPI Kristen Manning is a 83 y.o. female presenting with 2 months of midback pain moving along her ribs bilaterally towards her abdomen which has gotten worse upon awakening this morning she is experiencing more abdominal symptoms and nausea since this morning. She is reporting a fullness sensation in her abdomen and squeezing discomfort. She reports being seen a few days ago at a specialist appointment with rheumatology for the same back pain and had an ANA positive and was sent back to her primary care provider for further evaluation of abdominal discomfort. She reports a lot of illness and concerns with stress in her family and she is currently caring for multiple family members. States that she gets really worried whenever something feels different and she comes and gets evaluated. She denies any vomiting but reports a bowel movement earlier this afternoon that was darker than normal but no blood or black tar. She denies any loss of bowel or bladder function, numbness, night sweats, dysuria, hematuria, fever, chills.  HPI  Past Medical History:  Diagnosis Date  . Arthritis   . Bacterial infection    in lungs in Jan 2015  . Bilateral lower extremity edema   . Cancer ([place])    skin cancer, back, legs melonama  . Chronic back pain   . GERD (gastroesophageal reflux disease)    takes Omeprazole daily  . Headache(784.0)   . History of migraine    last one about 15 yrs ago  . Hyperlipidemia    takes Zetia daily  . Hypertension    takes HCTZ daily  . Joint pain   . Joint swelling   . Myocardial infarction [facility])    pt states EKG always shows infarct but no change from previous ekg;never knew anything about it  . Nocturia   . Osteoarthritis   . Pneumonia    hx of-many yrs ago  . Pneumonia   . PONV  (postoperative nausea and vomiting)   . Shortness of breath    with exertion  . Urinary frequency   . Urinary incontinence   . Urinary urgency     Patient Active Problem List   Diagnosis Date Noted  . ANA positive 09/02/2016  . Myalgia 06/26/2016  . Chest pain 08/01/2015  . Shortness of breath 03/22/2015  . History of total knee replacement, bilateral 03/20/2015  . Hypothyroidism 09/21/2014  . Cephalalgia 09/15/2014  . Gout 06/20/2014  . Skin infection 06/20/2014  . Lower leg edema 06/13/2014  . Popliteal pain 06/13/2014  . Pain in both feet 06/13/2014  . Chest wall pain 04/29/2014  . Sinusitis, acute maxillary 12/24/2013  . Meningioma ([place]) 10/07/2013  . Chronic tension-type headache, intractable 10/07/2013  . Redness-Right Leg 08/11/2013  . Pain of right lower leg 08/11/2013  . Lymphangitis, acute, lower leg 08/11/2013  . Neck pain 07/28/2013  . Swelling of limb 07/07/2013  . Headache(784.0) 07/07/2013  . Depression 06/23/2013  . Insomnia 05/26/2013  . Arthritis of knee, right 11/26/2012  . Routine general medical examination at a health care facility 10/17/2011  . Sinusitis 09/19/2011  . HTN (hypertension) 07/03/2011  . Hyperlipidemia 07/03/2011  . Venous insufficiency, peripheral 07/03/2011    Past Surgical History:  Procedure Laterality Date  . bladder tack  1998  . BLEPHAROPLASTY Bilateral   .  JOINT REPLACEMENT Left 10/02/2009  . JOINT REPLACEMENT Right 02-22-13   Knee  . KNEE SURGERY Bilateral 2009 and 2011  . OOPHORECTOMY  1998   only one  . TONSILLECTOMY  as a child ? date  . TOTAL KNEE ARTHROPLASTY Right 02/22/2013   DR Ronnie Derby  . TOTAL KNEE ARTHROPLASTY Right 02/22/2013   Procedure: TOTAL KNEE ARTHROPLASTY;  Surgeon: Vickey Huger, MD;  Location: [facility];  Service: Orthopedics;  Laterality: Right;    OB History    No data available       Home Medications    Prior to Admission medications   Medication Sig Start Date End Date Taking? Authorizing  Provider  allopurinol (ZYLOPRIM) 300 MG tablet Take 300 mg by mouth daily. 06/27/15   [provider]  BEE POLLEN PO Take by mouth.    [provider]  ezetimibe (ZETIA) 10 MG tablet take 1 tablet by mouth once daily 04/17/16   Midge Minium, MD  fenofibrate 160 MG tablet take 1 tablet by mouth once daily 05/13/16   Midge Minium, MD  furosemide (LASIX) 40 MG tablet Take 40 mg by mouth 2 (two) times daily. 08/06/16   [provider]  ibuprofen (ADVIL,MOTRIN) 200 MG tablet Take 200 mg by mouth every 6 (six) hours as needed.    [provider]  levothyroxine (SYNTHROID, LEVOTHROID) 50 MCG tablet take 1 tablet by mouth once daily 11/13/15   Midge Minium, MD  loratadine (CLARITIN) 10 MG tablet Take 10 mg by mouth daily.    [provider]  naproxen sodium (ANAPROX) 220 MG tablet Take 220 mg by mouth 2 (two) times daily with a meal.    [provider]  omeprazole (PRILOSEC) 20 MG capsule Take 20 mg by mouth daily.    [provider]    Family History Family History  Problem Relation Age of Onset  . Kidney disease Mother   . Heart disease Mother   . Hypertension Mother   . Varicose Veins Mother   . Kidney failure Mother   . Alcohol abuse Father   . Stroke Father        several   . Hypertension Father   . Varicose Veins Father   . Alcohol abuse Brother   . Hyperlipidemia Brother   . Hypertension Brother   . Early death Brother   . Varicose Veins Brother   . Heart disease Brother   . Hypertension Brother   . Arthritis Brother        Gout  . Varicose Veins Son   . Deep vein thrombosis Son   . Breast cancer Maternal Aunt     Social History Social History  Substance Use Topics  . Smoking status: Never Smoker  . Smokeless tobacco: Never Used  . Alcohol use No     Allergies   Aspirin and Codeine   Review of Systems Review of Systems  Constitutional: Negative for chills, diaphoresis and fever.  HENT:  Positive for congestion. Negative for ear pain, sore throat and trouble swallowing.        She reports increased mucus in her throat  Respiratory: Negative for cough, chest tightness, shortness of breath, wheezing and stridor.   Cardiovascular: Negative for chest pain and palpitations.  Gastrointestinal: Positive for abdominal distention, abdominal pain and nausea. Negative for anal bleeding, blood in stool, constipation, diarrhea and vomiting.  Genitourinary: Positive for flank pain. Negative for difficulty urinating, dysuria and hematuria.  Musculoskeletal: Positive for back pain.  Negative for arthralgias, neck pain and neck stiffness.  Skin: Negative for color change, pallor, rash and wound.  Neurological: Negative for dizziness, seizures, syncope, facial asymmetry, weakness and numbness.     Physical Exam Updated Vital Signs BP (!) 143/69 (BP Location: Right Arm)   Pulse 90   Temp 97.7 F (36.5 C) (Oral)   Resp 20   Ht 5\' 6"  (1.676 m)   Wt 98.4 kg (217 lb)   SpO2 97%   BMI 35.02 kg/m   Physical Exam  Constitutional: She is oriented to person, place, and time. She appears well-developed and well-nourished. No distress.  Afebrile, nontoxic-appearing, lying comfortably in bed in no acute distress.  HENT:  Head: Normocephalic and atraumatic.  Eyes: Conjunctivae and EOM are normal. No scleral icterus.  Neck: Normal range of motion. Neck supple.  Cardiovascular: Normal rate, regular rhythm, normal heart sounds and intact distal pulses.   No murmur heard. Pulmonary/Chest: Effort normal. No respiratory distress.  Mild basilar crackles appreciated. Lungs otherwise clear and equal bilaterally  Abdominal: Soft. She exhibits distension. She exhibits no mass. There is tenderness. There is no rebound and no guarding.  Mild distention and left lower quadrant tenderness palpation. Generalized discomfort with palpation of the abdomen. Bilateral CVA tenderness. Active bowel sounds. abdomen is  supple. No palpated masses.  Negative murphy's sign Negative McBurney's point tenderness   Musculoskeletal: Normal range of motion. She exhibits edema. She exhibits no deformity.  Baseline erythema and edema bilaterally from venous stasis. No change per patient.  Neurological: She is alert and oriented to person, place, and time.  Skin: Skin is warm and dry. She is not diaphoretic. No pallor.  Psychiatric: She has a normal mood and affect.  Nursing note and vitals reviewed.    ED Treatments / Results  Labs (all labs ordered are listed, but only abnormal results are displayed) Labs Reviewed  COMPREHENSIVE METABOLIC PANEL - Abnormal; Notable for the following:       Result Value   BUN 24 (*)    Creatinine, Ser 1.34 (*)    GFR calc non Af Amer 36 (*)    GFR calc Af Amer 42 (*)    All other components within normal limits  CBC WITH DIFFERENTIAL/PLATELET - Abnormal; Notable for the following:    Hemoglobin 11.8 (*)    HCT 35.8 (*)    All other components within normal limits  URINALYSIS, ROUTINE W REFLEX MICROSCOPIC - Abnormal; Notable for the following:    Specific Gravity, Urine <1.005 (*)    Hgb urine dipstick TRACE (*)    Leukocytes, UA TRACE (*)    All other components within normal limits  URINALYSIS, MICROSCOPIC (REFLEX) - Abnormal; Notable for the following:    Bacteria, UA RARE (*)    Squamous Epithelial / LPF 0-5 (*)    All other components within normal limits  URINE CULTURE  LIPASE, BLOOD    EKG  EKG Interpretation None       Radiology Dg Chest 2 View  Result Date: 09/06/2016 CLINICAL DATA:  Coughing for several days EXAM: CHEST  2 VIEW COMPARISON:  06/23/2016 FINDINGS: The heart size and mediastinal contours are within normal limits. Coarse bibasilar opacity likely chronic. Negative for pneumothorax. Degenerative changes of the spine. Probable rotator cuff disease right greater than left. IMPRESSION: No active cardiopulmonary disease. Electronically Signed    By: Donavan Foil M.D.   On: 09/06/2016 18:06   Ct Abdomen Pelvis W Contrast  Result Date: 09/06/2016 CLINICAL DATA:  83 year old female with acute right abdominal and pelvic pain. EXAM: CT ABDOMEN AND PELVIS WITH CONTRAST TECHNIQUE: Multidetector CT imaging of the abdomen and pelvis was performed using the standard protocol following bolus administration of intravenous contrast. CONTRAST:  54mL ISOVUE-300 IOPAMIDOL (ISOVUE-300) INJECTION 61% COMPARISON:  None. FINDINGS: Lower chest: No acute abnormality Hepatobiliary: The liver is unremarkable. High-density material within the gallbladder may represent cholelithiasis versus gallbladder sludge. No CT evidence of acute cholecystitis noted. No biliary dilatation. Pancreas: Atrophic pancreas without other significant abnormality Spleen: Unremarkable Adrenals/Urinary Tract: Moderate bilateral renal atrophy noted. No evidence of hydronephrosis or obstructing urinary calculi. A 2 cm left adrenal adenoma is noted. The right adrenal gland and bladder are unremarkable. Stomach/Bowel: Stomach is within normal limits. No evidence of bowel wall thickening, distention, or inflammatory changes. Colonic diverticulosis noted without evidence of diverticulitis. Vascular/Lymphatic: Aortic atherosclerosis. No enlarged abdominal or pelvic lymph nodes. Reproductive: Status post hysterectomy. No adnexal masses. Other: No ascites, focal collection or pneumoperitoneum. Musculoskeletal: No acute abnormality or suspicious bony lesion. IMPRESSION: 1. No evidence of acute abnormality 2. High density material within the gallbladder -question cholelithiasis versus gallbladder sludge. No CT evidence of acute cholecystitis. If there is strong clinical suspicion for acute cholecystitis, consider ultrasound evaluation. 3. Colonic diverticulosis without diverticulitis. 4.  Aortic Atherosclerosis (ICD10-I70.0). Electronically Signed   By: Margarette Canada M.D.   On: 09/06/2016 18:17     Procedures Procedures (including critical care time)  Medications Ordered in ED Medications  iopamidol (ISOVUE-300) 61 % injection 100 mL (80 mLs Intravenous Contrast Given 09/06/16 1745)     Initial Impression / Assessment and Plan / ED Course  I have reviewed the triage vital signs and the nursing notes.  Pertinent labs & imaging results that were available during my care of the patient were reviewed by me and considered in my medical decision making (see chart for details).     Patient presenting with 2 months of chronic midback discomfort radiating bilaterally along the rib cage and now causing her abdominal discomfort with nausea. Patient declined anything for pain or nausea at this time.  Will workup for abdominal pain and rule out pneumonia with chest x-ray. She is otherwise well-appearing, nontoxic. She seems very anxious and concerned about her health. She became tearful twice discussing matters of family being ill and her worrying about being sick as well.  Chest x-ray negative CT without acute abnormalities. Diverticulosis without acute diverticulitis.  Will discharge patient with close follow-up with PCP as already scheduled in a few days. I do not believe there is a serious etiology to patient's symptoms at this time. Patient was advised of incidental finding of possible biliary sludge versus small cholelithiasis and advised to return if she experienced right upper quadrant pain after meals or that persist. She is otherwise stable, well-appearing, nontoxic and afebrile.  Patient was discussed with Dr. Jeneen Rinks who has seen patient and agrees with assessment and plan.  Discussed strict return precautions and advised to return to the emergency department if experiencing any new or worsening symptoms. Instructions were understood and patient agreed with discharge plan.  Final Clinical Impressions(s) / ED Diagnoses   Final diagnoses:  Chronic bilateral thoracic back pain   Generalized abdominal pain  Nausea    New Prescriptions Discharge Medication List as of 09/06/2016  7:54 PM       Emeline General, PA-C 09/07/16 0200    Tanna Furry, MD 09/21/16 2034

## 2016-09-06 NOTE — Discharge Instructions (Signed)
As discussed, please make sure you stay well-hydrated and follow-up at your appointment with your primary care provider on Wednesday. Get some rest.  Return if you experience any worsening symptoms including abdominal pain, right upper quadrant pain after meals, fever, chills, nausea, vomiting, chest pain, shortness of breath, loss of bowel or bladder function, weakness, numbness or other concerning symptoms in the meantime.

## 2016-09-06 NOTE — ED Triage Notes (Signed)
C/o mid/lower back pain, bilat rib pain x 2 months-seen by PCP for c/o-was sent to Rheumatologist-referred back to PCP-pt NAD-slow steady gait

## 2016-09-08 LAB — URINE CULTURE: Culture: <10000 — AB

## 2016-09-10 NOTE — Progress Notes (Addendum)
Subjective:   Kristen Manning is a 83 y.o. female who presents for Medicare Annual (Subsequent) preventive examination.  Review of Systems:  No ROS.  Medicare Wellness Visit. Additional risk factors are reflected in the social history.  Cardiac Risk Factors include: dyslipidemia;hypertension;advanced age (>48men, >77 women);obesity (BMI >30kg/m2);sedentary lifestyle;family history of premature cardiovascular disease   Sleep patterns: Sleeps 7-8 hours.  Home Safety/Smoke Alarms: Feels safe in home. Smoke alarms in place.  Living environment; residence and Firearm Safety: Lives with brother in 1 story home, 1 step at door.  Seat Belt Safety/Bike Helmet: Wears seat belt.   Female:   Pap-N/A       Mammo-06/12/2016, Negative.     Dexa scan-04/27/2014, Osteopenia. Ordered today.     CCS-Never. Aged out.      Objective:     Vitals: BP (!) 144/70 (BP Location: Left Arm, Patient Position: Sitting, Cuff Size: Normal)   Pulse 78   Temp 98.2 F (36.8 C) (Temporal)   Resp 18   Ht 5\' 6"  (1.676 m)   Wt 213 lb 3.2 oz (96.7 kg)   SpO2 96%   BMI 34.41 kg/m   Body mass index is 34.41 kg/m.   Tobacco History  Smoking Status  . Never Smoker  Smokeless Tobacco  . Never Used     Counseling given: Not Answered   Past Medical History:  Diagnosis Date  . Arthritis   . Bacterial infection    in lungs in Jan 2015  . Bilateral lower extremity edema   . Cancer ([place])    skin cancer, back, legs melonama  . Chronic back pain   . GERD (gastroesophageal reflux disease)    takes Omeprazole daily  . Headache(784.0)   . History of migraine    last one about 15 yrs ago  . Hyperlipidemia    takes Zetia daily  . Hypertension    takes HCTZ daily  . Joint pain   . Joint swelling   . Myocardial infarction [facility])    pt states EKG always shows infarct but no change from previous ekg;never knew anything about it  . Nocturia   . Osteoarthritis   . Pneumonia    hx of-many yrs ago  .  Pneumonia   . PONV (postoperative nausea and vomiting)   . Shortness of breath    with exertion  . Urinary frequency   . Urinary incontinence   . Urinary urgency    Past Surgical History:  Procedure Laterality Date  . bladder tack  1998  . BLEPHAROPLASTY Bilateral   . JOINT REPLACEMENT Left 10/02/2009  . JOINT REPLACEMENT Right 02-22-13   Knee  . KNEE SURGERY Bilateral 2009 and 2011  . OOPHORECTOMY  1998   only one  . TONSILLECTOMY  as a child ? date  . TOTAL KNEE ARTHROPLASTY Right 02/22/2013   DR Ronnie Derby  . TOTAL KNEE ARTHROPLASTY Right 02/22/2013   Procedure: TOTAL KNEE ARTHROPLASTY;  Surgeon: Vickey Huger, MD;  Location: [facility];  Service: Orthopedics;  Laterality: Right;   Family History  Problem Relation Age of Onset  . Kidney disease Mother   . Heart disease Mother   . Hypertension Mother   . Varicose Veins Mother   . Kidney failure Mother   . Alcohol abuse Father   . Stroke Father        several   . Hypertension Father   . Varicose Veins Father   . Alcohol abuse Brother   . Hyperlipidemia Brother   .  Hypertension Brother   . Early death Brother   . Varicose Veins Brother   . Heart disease Brother   . Hypertension Brother   . Arthritis Brother        Gout  . Varicose Veins Son   . Deep vein thrombosis Son   . Breast cancer Maternal Aunt    History  Sexual Activity  . Sexual activity: Not on file    Outpatient Encounter Prescriptions as of 09/11/2016  Medication Sig  . allopurinol (ZYLOPRIM) 300 MG tablet Take 300 mg by mouth daily.  [name] BEE POLLEN PO Take by mouth.  . Coenzyme Q10 (CO Q 10 PO) Take by mouth.  . ezetimibe (ZETIA) 10 MG tablet take 1 tablet by mouth once daily  . fenofibrate 160 MG tablet take 1 tablet by mouth once daily  . furosemide (LASIX) 40 MG tablet Take 40 mg by mouth daily.   [name] ibuprofen (ADVIL,MOTRIN) 200 MG tablet Take 200 mg by mouth every 6 (six) hours as needed.  [name] levothyroxine (SYNTHROID, LEVOTHROID) 50 MCG tablet take 1 tablet  by mouth once daily  . loratadine (CLARITIN) 10 MG tablet Take 10 mg by mouth daily.  [name] omeprazole (PRILOSEC) 20 MG capsule Take 20 mg by mouth daily.  . naproxen sodium (ANAPROX) 220 MG tablet Take 220 mg by mouth 2 (two) times daily with a meal.   No facility-administered encounter medications on file as of 09/11/2016.     Activities of Daily Living In your present state of health, do you have any difficulty performing the following activities: 09/11/2016 07/30/2016  Hearing? Y N  Comment Hearing aids, does not wear -  Vision? N -  Difficulty concentrating or making decisions? N N  Walking or climbing stairs? Y N  Comment SOB with climbing steps -  Dressing or bathing? N N  Doing errands, shopping? N N  Preparing Food and eating ? N N  Using the Toilet? N N  In the past six months, have you accidently leaked urine? N N  Do you have problems with loss of bowel control? N N  Managing your Medications? N N  Managing your Finances? N N  Housekeeping or managing your Housekeeping? N N  Some recent data might be hidden    Patient Care Team: Midge Minium, MD as PCP - General (Family Medicine) Vickey Huger, MD as Consulting Physician (Orthopedic Surgery) Harriett Sine, MD as Consulting Physician (Dermatology) Jana Half, DPM as Consulting Physician (Podiatry) Delice Lesch Lezlie Octave, MD as Consulting Physician (Neurology) Lorretta Harp, MD as Consulting Physician (Cardiology)    Assessment:    Physical assessment deferred to PCP.  Exercise Activities and Dietary recommendations Current Exercise Habits: The patient does not participate in regular exercise at present [facility] around [employer] an hour, 3 x week), Exercise limited by: orthopedic condition(s);respiratory conditions(s)   Diet (meal preparation, eat out, water intake, caffeinated beverages, dairy products, fruits and vegetables): Drinks water and gingerale.   Breakfast: egg, toast, bacon; oatmeal, fruit; cereal,  fruit Lunch: salad with protein Dinner: sandwich, soup    Goals      Patient Stated   . patient (pt-stated)          "I'd like to get out more, be more active and eat healthier."       Other   . Eat more fruits and vegetables    . Increase physical activity          Silver Sneakers Go back to [employer]  Workout with next Child psychotherapist.        Fall Risk Fall Risk  09/11/2016 07/30/2016 05/13/2016 09/14/2015 04/03/2015  Falls in the past year? No No No Yes No  Number falls in past yr: - - - - -  Injury with Fall? - - - Yes -  Risk for fall due to : - - - - Impaired mobility;History of fall(s)  Risk for fall due to: Comment - - - - "Have to be very careful when I walk"   Depression Screen PHQ 2/9 Scores 09/11/2016 07/30/2016 05/13/2016 04/03/2015  PHQ - 2 Score 0 1 0 0  PHQ- 9 Score - 1 0 -     Cognitive Function MMSE - Mini Mental State Exam 04/03/2015  Orientation to time 5  Orientation to Place 5  Registration 3  Attention/ Calculation 5  Recall 3  Language- name 2 objects 2  Language- repeat 1  Language- follow 3 step command 3  Language- read & follow direction 1  Write a sentence 1  Copy design 1  Total score 30        Immunization History  Administered Date(s) Administered  . Influenza,inj,Quad PF,6+ Mos 09/21/2014  . Pneumococcal Conjugate-13 09/21/2014  . Td 11/26/2012   Screening Tests Health Maintenance  Topic Date Due  . INFLUENZA VACCINE  10/07/2017 (Originally 08/07/2016)  . PNA vac Low Risk Adult (2 of 2 - PPSV23) 10/07/2017 (Originally 09/21/2015)  . MAMMOGRAM  06/12/2017  . TETANUS/TDAP  11/27/2022  . DEXA SCAN  Completed   Declines Influenza, PPSV23 and Shingrix. DEXA ordered.      Plan:    Bring a copy of your living will and/or healthcare power of attorney to your next office visit.  Continue doing brain stimulating activities (puzzles, reading, adult coloring books, staying active) to keep memory sharp.    I have personally reviewed and  noted the following in the patient's chart:   . Medical and social history . Use of alcohol, tobacco or illicit drugs  . Current medications and supplements . Functional ability and status . Nutritional status . Physical activity . Advanced directives . List of other physicians . Hospitalizations, surgeries, and ER visits in previous 12 months . Vitals . Screenings to include cognitive, depression, and falls . Referrals and appointments  In addition, I have reviewed and discussed with patient certain preventive protocols, quality metrics, and best practice recommendations. A written personalized care plan for preventive services as well as general preventive health recommendations were provided to patient.     Gerilyn Nestle, RN  09/11/2016  *Patient to f/u with PCP after AWV for ED f/u, continues to experience back and rib pain*  Reviewed documentation and agree w/ above.  Annye Asa, MD

## 2016-09-10 NOTE — Progress Notes (Signed)
Anticardiolipin IgM is elevated. Please advise patient to take aspirin 81 mg by mouth daily. We will discuss lab results at length during the follow-up visit.

## 2016-09-11 ENCOUNTER — Encounter: Payer: Self-pay | Admitting: Family Medicine

## 2016-09-11 ENCOUNTER — Ambulatory Visit (INDEPENDENT_AMBULATORY_CARE_PROVIDER_SITE_OTHER): Payer: Medicare Other

## 2016-09-11 ENCOUNTER — Ambulatory Visit (INDEPENDENT_AMBULATORY_CARE_PROVIDER_SITE_OTHER): Payer: Medicare Other | Admitting: Family Medicine

## 2016-09-11 VITALS — BP 144/70 | HR 78 | Temp 98.2°F | Resp 18 | Ht 66.0 in | Wt 213.2 lb

## 2016-09-11 VITALS — BP 134/78 | HR 78 | Temp 98.2°F | Resp 18 | Ht 66.0 in | Wt 213.1 lb

## 2016-09-11 DIAGNOSIS — E2839 Other primary ovarian failure: Secondary | ICD-10-CM

## 2016-09-11 DIAGNOSIS — Z0000 Encounter for general adult medical examination without abnormal findings: Secondary | ICD-10-CM

## 2016-09-11 DIAGNOSIS — K219 Gastro-esophageal reflux disease without esophagitis: Secondary | ICD-10-CM | POA: Diagnosis not present

## 2016-09-11 DIAGNOSIS — R109 Unspecified abdominal pain: Secondary | ICD-10-CM | POA: Diagnosis not present

## 2016-09-11 MED ORDER — ACETAMINOPHEN 500 MG PO TABS: 500.0000 mg | ORAL_TABLET | Freq: Three times a day (TID) | ORAL | 1 refills | Status: DC | PRN

## 2016-09-11 MED ORDER — PANTOPRAZOLE SODIUM 40 MG PO TBEC: 40.0000 mg | DELAYED_RELEASE_TABLET | Freq: Every day | ORAL | 3 refills | Status: DC

## 2016-09-11 NOTE — Progress Notes (Signed)
Pre visit review using our clinic review tool, if applicable. No additional management support is needed unless otherwise documented below in the visit note. 

## 2016-09-11 NOTE — Progress Notes (Signed)
we will discuss at fu visit.

## 2016-09-11 NOTE — Patient Instructions (Signed)
Follow up as needed or as scheduled STOP the Omeprazole START the Pantoprazole for the worsening reflux START Acetaminophen (Tylenol) every 8 hrs for pain- take w/ food (I sent a prescription so you can compare the price to OTC) Use a heating pad as needed for pain relief Call with any questions or concerns Hang in there!!!

## 2016-09-11 NOTE — Progress Notes (Signed)
   Subjective:    Patient ID: Kristen Manning, female    DOB: [birth year], 83 y.o.   MRN: [id number]  HPI ER f/u- pt was seen 8/31 complaining of mid back pain x2 months that radiates around ribs towards abdomen.  Urine cx was negative for UTI, WBC WNL, CXR and CT w/o abnormality.  Pt reports feeling better since ER but continues to have a 'nagging pain' in bilateral flanks and radiates forward and back.  Rarely taking NSAIDs, not taking tylenol.  Pt is now complaining of sore mouth and throat- extending into chest. Is on Omeprazole 20mg  daily.     Review of Systems For ROS see HPI     Objective:   Physical Exam  Constitutional: She is oriented to person, place, and time. She appears well-developed and well-nourished. No distress.  HENT:  Head: Normocephalic and atraumatic.  Cardiovascular: Normal rate, regular rhythm and normal heart sounds.   Pulmonary/Chest: Effort normal and breath sounds normal. No respiratory distress. She has no wheezes.  Abdominal: Soft. Bowel sounds are normal. She exhibits no distension. There is no tenderness. There is no rebound and no guarding.  Musculoskeletal: She exhibits tenderness (TTP along ribs in mid-axillary line bilaterally that radiates along ribs both anteriorly and posteriorly).  Neurological: She is alert and oriented to person, place, and time.  Skin: Skin is warm and dry. No rash noted. No erythema.  Psychiatric: Her behavior is normal. Thought content normal.  Anxious but otherwise WNL  Vitals reviewed.         Assessment & Plan:  Bilateral flank pain- new to provider, ongoing for pt.  Reviewed ER record- labs WNL, CXR and abdominal CT WNL.  Pt's pain is more consistent w/ musculoskeletal pain than true abdominal pain.  Pt has f/u w/ Rheumatology pending.  Is not on scheduled meds for pain- will start Tylenol TID for pain.  Heating pad prn.  Reviewed supportive care and red flags that should prompt return.  Pt expressed understanding and  is in agreement w/ plan.   GERD- Deteriorated.  Switch from Omeprazole to Protonix as current meds are not effective.  Will follow.

## 2016-09-11 NOTE — Patient Instructions (Addendum)
Bring a copy of your living will and/or healthcare power of attorney to your next office visit.  Continue doing brain stimulating activities (puzzles, reading, adult coloring books, staying active) to keep memory sharp.     Fall Prevention in the Home Falls can cause injuries. They can happen to people of all ages. There are many things you can do to make your home safe and to help prevent falls. What can I do on the outside of my home?  Regularly fix the edges of walkways and driveways and fix any cracks.  Remove anything that might make you trip as you walk through a door, such as a raised step or threshold.  Trim any bushes or trees on the path to your home.  Use bright outdoor lighting.  Clear any walking paths of anything that might make someone trip, such as rocks or tools.  Regularly check to see if handrails are loose or broken. Make sure that both sides of any steps have handrails.  Any raised decks and porches should have guardrails on the edges.  Have any leaves, snow, or ice cleared regularly.  Use sand or salt on walking paths during winter.  Clean up any spills in your garage right away. This includes oil or grease spills. What can I do in the bathroom?  Use night lights.  Install grab bars by the toilet and in the tub and shower. Do not use towel bars as grab bars.  Use non-skid mats or decals in the tub or shower.  If you need to sit down in the shower, use a plastic, non-slip stool.  Keep the floor dry. Clean up any water that spills on the floor as soon as it happens.  Remove soap buildup in the tub or shower regularly.  Attach bath mats securely with double-sided non-slip rug tape.  Do not have throw rugs and other things on the floor that can make you trip. What can I do in the bedroom?  Use night lights.  Make sure that you have a light by your bed that is easy to reach.  Do not use any sheets or blankets that are too big for your bed. They  should not hang down onto the floor.  Have a firm chair that has side arms. You can use this for support while you get dressed.  Do not have throw rugs and other things on the floor that can make you trip. What can I do in the kitchen?  Clean up any spills right away.  Avoid walking on wet floors.  Keep items that you use a lot in easy-to-reach places.  If you need to reach something above you, use a strong step stool that has a grab bar.  Keep electrical cords out of the way.  Do not use floor polish or wax that makes floors slippery. If you must use wax, use non-skid floor wax.  Do not have throw rugs and other things on the floor that can make you trip. What can I do with my stairs?  Do not leave any items on the stairs.  Make sure that there are handrails on both sides of the stairs and use them. Fix handrails that are broken or loose. Make sure that handrails are as long as the stairways.  Check any carpeting to make sure that it is firmly attached to the stairs. Fix any carpet that is loose or worn.  Avoid having throw rugs at the top or bottom of the stairs.   If you do have throw rugs, attach them to the floor with carpet tape.  Make sure that you have a light switch at the top of the stairs and the bottom of the stairs. If you do not have them, ask someone to add them for you. What else can I do to help prevent falls?  Wear shoes that: ? Do not have high heels. ? Have rubber bottoms. ? Are comfortable and fit you well. ? Are closed at the toe. Do not wear sandals.  If you use a stepladder: ? Make sure that it is fully opened. Do not climb a closed stepladder. ? Make sure that both sides of the stepladder are locked into place. ? Ask someone to hold it for you, if possible.  Clearly mark and make sure that you can see: ? Any grab bars or handrails. ? First and last steps. ? Where the edge of each step is.  Use tools that help you move around (mobility aids) if  they are needed. These include: ? Canes. ? Walkers. ? Scooters. ? Crutches.  Turn on the lights when you go into a dark area. Replace any light bulbs as soon as they burn out.  Set up your furniture so you have a clear path. Avoid moving your furniture around.  If any of your floors are uneven, fix them.  If there are any pets around you, be aware of where they are.  Review your medicines with your doctor. Some medicines can make you feel dizzy. This can increase your chance of falling. Ask your doctor what other things that you can do to help prevent falls. This information is not intended to replace advice given to you by your health care provider. Make sure you discuss any questions you have with your health care provider. Document Released: 10/20/2008 Document Revised: 06/01/2015 Document Reviewed: 01/28/2014 Elsevier Interactive Patient Education  2018 [employer]  Health Maintenance, Female Adopting a healthy lifestyle and getting preventive care can go a long way to promote health and wellness. Talk with your health care provider about what schedule of regular examinations is right for you. This is a good chance for you to check in with your provider about disease prevention and staying healthy. In between checkups, there are plenty of things you can do on your own. Experts have done a lot of research about which lifestyle changes and preventive measures are most likely to keep you healthy. Ask your health care provider for more information. Weight and diet Eat a healthy diet  Be sure to include plenty of vegetables, fruits, low-fat dairy products, and lean protein.  Do not eat a lot of foods high in solid fats, added sugars, or salt.  Get regular exercise. This is one of the most important things you can do for your health. ? Most adults should exercise for at least 150 minutes each week. The exercise should increase your heart rate and make you sweat (moderate-intensity  exercise). ? Most adults should also do strengthening exercises at least twice a week. This is in addition to the moderate-intensity exercise.  Maintain a healthy weight  Body mass index (BMI) is a measurement that can be used to identify possible weight problems. It estimates body fat based on height and weight. Your health care provider can help determine your BMI and help you achieve or maintain a healthy weight.  For females 20 years of age and older: ? A BMI below 18.5 is considered underweight. ? A BMI   of 18.5 to 24.9 is normal. ? A BMI of 25 to 29.9 is considered overweight. ? A BMI of 30 and above is considered obese.  Watch levels of cholesterol and blood lipids  You should start having your blood tested for lipids and cholesterol at 81 years of age, then have this test every 5 years.  You may need to have your cholesterol levels checked more often if: ? Your lipid or cholesterol levels are high. ? You are older than 81 years of age. ? You are at high risk for heart disease.  Cancer screening Lung Cancer  Lung cancer screening is recommended for adults 55-80 years old who are at high risk for lung cancer because of a history of smoking.  A yearly low-dose CT scan of the lungs is recommended for people who: ? Currently smoke. ? Have quit within the past 15 years. ? Have at least a 30-pack-year history of smoking. A pack year is smoking an average of one pack of cigarettes a day for 1 year.  Yearly screening should continue until it has been 15 years since you quit.  Yearly screening should stop if you develop a health problem that would prevent you from having lung cancer treatment.  Breast Cancer  Practice breast self-awareness. This means understanding how your breasts normally appear and feel.  It also means doing regular breast self-exams. Let your health care provider know about any changes, no matter how small.  If you are in your 20s or 30s, you should have a  clinical breast exam (CBE) by a health care provider every 1-3 years as part of a regular health exam.  If you are 40 or older, have a CBE every year. Also consider having a breast X-ray (mammogram) every year.  If you have a family history of breast cancer, talk to your health care provider about genetic screening.  If you are at high risk for breast cancer, talk to your health care provider about having an MRI and a mammogram every year.  Breast cancer gene (BRCA) assessment is recommended for women who have family members with BRCA-related cancers. BRCA-related cancers include: ? Breast. ? Ovarian. ? Tubal. ? Peritoneal cancers.  Results of the assessment will determine the need for genetic counseling and BRCA1 and BRCA2 testing.  Cervical Cancer Your health care provider may recommend that you be screened regularly for cancer of the pelvic organs (ovaries, uterus, and vagina). This screening involves a pelvic examination, including checking for microscopic changes to the surface of your cervix (Pap test). You may be encouraged to have this screening done every 3 years, beginning at age 21.  For women ages 30-65, health care providers may recommend pelvic exams and Pap testing every 3 years, or they may recommend the Pap and pelvic exam, combined with testing for human papilloma virus (HPV), every 5 years. Some types of HPV increase your risk of cervical cancer. Testing for HPV may also be done on women of any age with unclear Pap test results.  Other health care providers may not recommend any screening for nonpregnant women who are considered low risk for pelvic cancer and who do not have symptoms. Ask your health care provider if a screening pelvic exam is right for you.  If you have had past treatment for cervical cancer or a condition that could lead to cancer, you need Pap tests and screening for cancer for at least 20 years after your treatment. If Pap tests have been discontinued,    your risk factors (such as having a new sexual partner) need to be reassessed to determine if screening should resume. Some women have medical problems that increase the chance of getting cervical cancer. In these cases, your health care provider may recommend more frequent screening and Pap tests.  Colorectal Cancer  This type of cancer can be detected and often prevented.  Routine colorectal cancer screening usually begins at 81 years of age and continues through 81 years of age.  Your health care provider may recommend screening at an earlier age if you have risk factors for colon cancer.  Your health care provider may also recommend using home test kits to check for hidden blood in the stool.  A small camera at the end of a tube can be used to examine your colon directly (sigmoidoscopy or colonoscopy). This is done to check for the earliest forms of colorectal cancer.  Routine screening usually begins at age 50.  Direct examination of the colon should be repeated every 5-10 years through 81 years of age. However, you may need to be screened more often if early forms of precancerous polyps or small growths are found.  Skin Cancer  Check your skin from head to toe regularly.  Tell your health care provider about any new moles or changes in moles, especially if there is a change in a mole's shape or color.  Also tell your health care provider if you have a mole that is larger than the size of a pencil eraser.  Always use sunscreen. Apply sunscreen liberally and repeatedly throughout the day.  Protect yourself by wearing long sleeves, pants, a wide-brimmed hat, and sunglasses whenever you are outside.  Heart disease, diabetes, and high blood pressure  High blood pressure causes heart disease and increases the risk of stroke. High blood pressure is more likely to develop in: ? People who have blood pressure in the high end of the normal range (130-139/85-89 mm Hg). ? People who are  overweight or obese. ? People who are African American.  If you are 18-39 years of age, have your blood pressure checked every 3-5 years. If you are 40 years of age or older, have your blood pressure checked every year. You should have your blood pressure measured twice-once when you are at a hospital or clinic, and once when you are not at a hospital or clinic. Record the average of the two measurements. To check your blood pressure when you are not at a hospital or clinic, you can use: ? An automated blood pressure machine at a pharmacy. ? A home blood pressure monitor.  If you are between 55 years and 79 years old, ask your health care provider if you should take aspirin to prevent strokes.  Have regular diabetes screenings. This involves taking a blood sample to check your fasting blood sugar level. ? If you are at a normal weight and have a low risk for diabetes, have this test once every three years after 81 years of age. ? If you are overweight and have a high risk for diabetes, consider being tested at a younger age or more often. Preventing infection Hepatitis B  If you have a higher risk for hepatitis B, you should be screened for this virus. You are considered at high risk for hepatitis B if: ? You were born in a country where hepatitis B is common. Ask your health care provider which countries are considered high risk. ? Your parents were born in a high-risk   country, and you have not been immunized against hepatitis B (hepatitis B vaccine). ? You have HIV or AIDS. ? You use needles to inject street drugs. ? You live with someone who has hepatitis B. ? You have had sex with someone who has hepatitis B. ? You get hemodialysis treatment. ? You take certain medicines for conditions, including cancer, organ transplantation, and autoimmune conditions.  Hepatitis C  Blood testing is recommended for: ? Everyone born from 1945 through 1965. ? Anyone with known risk factors for  hepatitis C.  Sexually transmitted infections (STIs)  You should be screened for sexually transmitted infections (STIs) including gonorrhea and chlamydia if: ? You are sexually active and are younger than 81 years of age. ? You are older than 81 years of age and your health care provider tells you that you are at risk for this type of infection. ? Your sexual activity has changed since you were last screened and you are at an increased risk for chlamydia or gonorrhea. Ask your health care provider if you are at risk.  If you do not have HIV, but are at risk, it may be recommended that you take a prescription medicine daily to prevent HIV infection. This is called pre-exposure prophylaxis (PrEP). You are considered at risk if: ? You are sexually active and do not regularly use condoms or know the HIV status of your partner(s). ? You take drugs by injection. ? You are sexually active with a partner who has HIV.  Talk with your health care provider about whether you are at high risk of being infected with HIV. If you choose to begin PrEP, you should first be tested for HIV. You should then be tested every 3 months for as long as you are taking PrEP. Pregnancy  If you are premenopausal and you may become pregnant, ask your health care provider about preconception counseling.  If you may become pregnant, take 400 to 800 micrograms (mcg) of folic acid every day.  If you want to prevent pregnancy, talk to your health care provider about birth control (contraception). Osteoporosis and menopause  Osteoporosis is a disease in which the bones lose minerals and strength with aging. This can result in serious bone fractures. Your risk for osteoporosis can be identified using a bone density scan.  If you are 65 years of age or older, or if you are at risk for osteoporosis and fractures, ask your health care provider if you should be screened.  Ask your health care provider whether you should take a  calcium or vitamin D supplement to lower your risk for osteoporosis.  Menopause may have certain physical symptoms and risks.  Hormone replacement therapy may reduce some of these symptoms and risks. Talk to your health care provider about whether hormone replacement therapy is right for you. Follow these instructions at home:  Schedule regular health, dental, and eye exams.  Stay current with your immunizations.  Do not use any tobacco products including cigarettes, chewing tobacco, or electronic cigarettes.  If you are pregnant, do not drink alcohol.  If you are breastfeeding, limit how much and how often you drink alcohol.  Limit alcohol intake to no more than 1 drink per day for nonpregnant women. One drink equals 12 ounces of beer, 5 ounces of wine, or 1 ounces of hard liquor.  Do not use street drugs.  Do not share needles.  Ask your health care provider for help if you need support or information about quitting drugs.    Tell your health care provider if you often feel depressed.  Tell your health care provider if you have ever been abused or do not feel safe at home. This information is not intended to replace advice given to you by your health care provider. Make sure you discuss any questions you have with your health care provider. Document Released: 07/09/2010 Document Revised: 06/01/2015 Document Reviewed: 09/27/2014 [employer] Interactive Patient Education  2018 [employer]  

## 2016-09-16 ENCOUNTER — Encounter: Payer: Self-pay | Admitting: Neurology

## 2016-09-16 ENCOUNTER — Ambulatory Visit (INDEPENDENT_AMBULATORY_CARE_PROVIDER_SITE_OTHER): Payer: Medicare Other | Admitting: Neurology

## 2016-09-16 VITALS — BP 146/68 | HR 75 | Ht 66.0 in | Wt 215.0 lb

## 2016-09-16 DIAGNOSIS — G44209 Tension-type headache, unspecified, not intractable: Secondary | ICD-10-CM | POA: Insufficient documentation

## 2016-09-16 NOTE — Progress Notes (Signed)
Rest   NEUROLOGY FOLLOW UP OFFICE NOTE  Kristen Manning [id number]  HISTORY OF PRESENT ILLNESS: I had the pleasure of seeing Kristen Manning in follow-up in the neurology clinic on 09/16/2016.  The patient was last seen a year ago for worsening headaches, likely cervicogenic versus tension-type headaches. On her last visit, she reported resolution of headaches, but had a different type of headache that she attributed to allergies. She denies any recurrence of bad headaches, but continues to report nasal drainage and congestion with a slight nagging pain in the sinus regions, "enough to know it's there." There is no associated nausea/vomiting, photo/phonophobia, vision changes, dizziness, focal numbness/tingling/weakness, no falls. She has a small left temporal meningioma, repeat imaging had shown stable findings. She is reporting bilateral flank pain and continues to follow-up with her PCP and rheumatologist for this.   HPI: This is a pleasant 83 yo RH woman with a history of hypertension, hyperlipidemia, melanoma, who presented with headaches that started after knee surgery last February 2015. She had a difficult time during the admission, with no recollection of her hospital stay. She asked for hospital records which reported walking several steps, which she does not remember. She recalls waking up in pain, and "waking up" when she was to be transferred to the nursing home. She did not have a good experience at the SNF, and feels she had headaches at that time but was taking pain medications for her knee. She started to notice the daily headaches once she got home in March. She reports headaches are constant over the left temporal region, usually a 2 or 3/10, but increasing in severity up to a 5 or 6/10. Headaches would radiate over the periorbital, frontal, and occipital regions, described as a throbbing pain that would cause some dizziness and nausea when more severe. She has a more intense headache  today. She has noticed blurred vision in both eyes. She takes Advil 2-3 times a week when pain is more severe, but it only helps calm the pain down. She denies having any headache-free days in the past 4 months. She reports that she had trouble sleeping once she got home, bothered by the memories of other patients at the [facility]. She was prescribed Zoloft by her PCP but did not take it due to listed side effects on the package. She reports that sleep is a little better now, she usually gets 6 hours of sleep but wakes up several times to urinate due to the HCTZ she takes at bedtime. She rarely takes her brother's Ativan to sleep (1-2/month per patient). She reports a history of migraines with her period when younger, but has been headache-free for 15 years until last March. She denies any family history of migraines.   Diagnostic Data: I personally reviewed her MRI brain with and without contrast with the patient, there is a moderate amount of chronic microvascular disease in the bilateral subcortical regions. There is a small 1.8 x 1.1 x 1.4 cm meningioma in the left temporal region with minimal mass effect, no edema. There is note of a prominent left PCA infundibulum extending off the left ICA versus small aneurysm, incompletely evaluated on this exam.  MRI brain 09/13/14 unchanged.  PAST MEDICAL HISTORY: Past Medical History:  Diagnosis Date  . Arthritis   . Bacterial infection    in lungs in Jan 2015  . Bilateral lower extremity edema   . Cancer ([place])    skin cancer, back, legs melonama  . Chronic back pain   .  GERD (gastroesophageal reflux disease)    takes Omeprazole daily  . Headache(784.0)   . History of migraine    last one about 15 yrs ago  . Hyperlipidemia    takes Zetia daily  . Hypertension    takes HCTZ daily  . Joint pain   . Joint swelling   . Myocardial infarction [facility])    pt states EKG always shows infarct but no change from previous ekg;never knew anything about it  . Nocturia    . Osteoarthritis   . Pneumonia    hx of-many yrs ago  . Pneumonia   . PONV (postoperative nausea and vomiting)   . Shortness of breath    with exertion  . Urinary frequency   . Urinary incontinence   . Urinary urgency     MEDICATIONS: Current Outpatient Prescriptions on File Prior to Visit  Medication Sig Dispense Refill  . acetaminophen (TYLENOL) 500 MG tablet Take 1 tablet (500 mg total) by mouth every 8 (eight) hours as needed for moderate pain. 90 tablet 1  . allopurinol (ZYLOPRIM) 300 MG tablet Take 300 mg by mouth daily.  0  . BEE POLLEN PO Take by mouth.    . Coenzyme Q10 (CO Q 10 PO) Take by mouth.    . ezetimibe (ZETIA) 10 MG tablet take 1 tablet by mouth once daily 30 tablet 5  . fenofibrate 160 MG tablet take 1 tablet by mouth once daily 30 tablet 6  . furosemide (LASIX) 40 MG tablet Take 40 mg by mouth daily.   0  . ibuprofen (ADVIL,MOTRIN) 200 MG tablet Take 200 mg by mouth every 6 (six) hours as needed.    [name] levothyroxine (SYNTHROID, LEVOTHROID) 50 MCG tablet take 1 tablet by mouth once daily 30 tablet 6  . loratadine (CLARITIN) 10 MG tablet Take 10 mg by mouth daily.    . naproxen sodium (ANAPROX) 220 MG tablet Take 220 mg by mouth 2 (two) times daily with a meal.    . pantoprazole (PROTONIX) 40 MG tablet Take 1 tablet (40 mg total) by mouth daily. 30 tablet 3   No current facility-administered medications on file prior to visit.     ALLERGIES: Allergies  Allergen Reactions  . Aspirin Other (See Comments)    Noted caused 2 holes in retina, not sure   . Codeine Hives    FAMILY HISTORY: Family History  Problem Relation Age of Onset  . Kidney disease Mother   . Heart disease Mother   . Hypertension Mother   . Varicose Veins Mother   . Kidney failure Mother   . Alcohol abuse Father   . Stroke Father        several   . Hypertension Father   . Varicose Veins Father   . Alcohol abuse Brother   . Hyperlipidemia Brother   . Hypertension Brother   .  Early death Brother   . Varicose Veins Brother   . Heart disease Brother   . Hypertension Brother   . Arthritis Brother        Gout  . Varicose Veins Son   . Deep vein thrombosis Son   . Breast cancer Maternal Aunt     SOCIAL HISTORY: Social History   Social History  . Marital status: Widowed    Spouse name: N/A  . Number of children: N/A  . Years of education: N/A   Occupational History  . Not on file.   Social History Main Topics  . Smoking status:  Never Smoker  . Smokeless tobacco: Never Used  . Alcohol use No  . Drug use: No  . Sexual activity: Not on file   Other Topics Concern  . Not on file   Social History Narrative  . No narrative on file    REVIEW OF SYSTEMS: Constitutional: No fevers, chills, or sweats, no generalized fatigue, change in appetite Eyes: No visual changes, double vision, eye pain Ear, nose and throat: No hearing loss, ear pain, nasal congestion, sore throat Cardiovascular: No chest pain, palpitations Respiratory:  No shortness of breath at rest or with exertion, wheezes GastrointestinaI: No nausea, vomiting, diarrhea, abdominal pain, fecal incontinence Genitourinary:  No dysuria, urinary retention or frequency Musculoskeletal:  + neck pain, back pain Integumentary: No rash, pruritus, skin lesions Neurological: as above Psychiatric: + depression, insomnia, anxiety Endocrine: No palpitations, fatigue, diaphoresis, mood swings, change in appetite, change in weight, increased thirst Hematologic/Lymphatic:  No anemia, purpura, petechiae. Allergic/Immunologic: no itchy/runny eyes,+ nasal congestion,no recent allergic reactions, rashes  PHYSICAL EXAM: Vitals:   09/16/16 0834  BP: (!) 146/68  Pulse: 75  SpO2: 97%   General: No acute distress Head:  Normocephalic/atraumatic Neck: supple, no paraspinal tenderness, full range of motion Heart:  Regular rate and rhythm Lungs:  Clear to auscultation bilaterally Back: No paraspinal  tenderness Skin/Extremities: No rash, no edema Neurological Exam: alert and oriented to person, place, and time. No aphasia or dysarthria. Fund of knowledge is appropriate.  Recent and remote memory are intact.  Attention and concentration are normal.    Able to name objects and repeat phrases. Cranial nerves: Pupils equal, round, reactive to light.  Fundoscopic exam unremarkable, no papilledema. Extraocular movements intact with no nystagmus. Visual fields full. Facial sensation intact. No facial asymmetry. Tongue, uvula, palate midline.  Motor: Bulk and tone normal, muscle strength 5/5 throughout with no pronator drift.  Sensation to light touch intact.  No extinction to double simultaneous stimulation.  Deep tendon reflexes +1 throughout, toes downgoing.  Finger to nose testing intact.  Gait slow and cautious. Romberg negative.  IMPRESSION: This is a pleasant 83 yo RH woman with a history of hypertension, hyperlipidemia, melanoma, who presented with new onset daily headaches since March 2015. MRI brain had shown an incidental finding of a small meningioma over the left temporal region, stable in size with repeat imaging in 2016. The headaches she initially presented with have resolved and have not recurred, she denies any further daily headaches, but continues to report sinus congestion. Her neurological exam is normal. She will follow-up on a prn basis and knows to call for any changes.   Thank you for allowing me to participate in her care.  Please do not hesitate to call for any questions or concerns.  The duration of this appointment visit was 15 minutes of face-to-face time with the patient.  Greater than 50% of this time was spent in counseling, explanation of diagnosis, planning of further management, and coordination of care.   Ellouise Newer, M.D.   CC: Dr. Birdie Riddle

## 2016-09-16 NOTE — Patient Instructions (Signed)
Great seeing you! Follow-up as needed, call for any changes.

## 2016-09-27 ENCOUNTER — Other Ambulatory Visit: Payer: Medicare Other

## 2016-10-03 DIAGNOSIS — M79671 Pain in right foot: Secondary | ICD-10-CM | POA: Diagnosis not present

## 2016-10-03 DIAGNOSIS — G603 Idiopathic progressive neuropathy: Secondary | ICD-10-CM | POA: Diagnosis not present

## 2016-10-03 DIAGNOSIS — E559 Vitamin D deficiency, unspecified: Secondary | ICD-10-CM | POA: Diagnosis not present

## 2016-10-03 DIAGNOSIS — M5412 Radiculopathy, cervical region: Secondary | ICD-10-CM | POA: Diagnosis not present

## 2016-10-03 DIAGNOSIS — G5603 Carpal tunnel syndrome, bilateral upper limbs: Secondary | ICD-10-CM | POA: Diagnosis not present

## 2016-10-03 DIAGNOSIS — R202 Paresthesia of skin: Secondary | ICD-10-CM | POA: Diagnosis not present

## 2016-10-03 DIAGNOSIS — M5417 Radiculopathy, lumbosacral region: Secondary | ICD-10-CM | POA: Diagnosis not present

## 2016-10-07 DIAGNOSIS — M51369 Other intervertebral disc degeneration, lumbar region without mention of lumbar back pain or lower extremity pain: Secondary | ICD-10-CM | POA: Insufficient documentation

## 2016-10-07 DIAGNOSIS — M19042 Primary osteoarthritis, left hand: Secondary | ICD-10-CM | POA: Insufficient documentation

## 2016-10-07 DIAGNOSIS — M5136 Other intervertebral disc degeneration, lumbar region: Secondary | ICD-10-CM | POA: Insufficient documentation

## 2016-10-07 DIAGNOSIS — M19071 Primary osteoarthritis, right ankle and foot: Secondary | ICD-10-CM | POA: Insufficient documentation

## 2016-10-07 DIAGNOSIS — M19072 Primary osteoarthritis, left ankle and foot: Secondary | ICD-10-CM

## 2016-10-07 DIAGNOSIS — M19041 Primary osteoarthritis, right hand: Secondary | ICD-10-CM | POA: Insufficient documentation

## 2016-10-07 NOTE — Progress Notes (Deleted)
Office Visit Note  Patient: Kristen Manning             Date of Birth: Apr 08, 1932           MRN: [id number]             PCP: Midge Minium, MD Referring: Midge Minium, MD Visit Date: 10/09/2016 Occupation: @GUAROCC @    Subjective:  No chief complaint on file.   History of Present Illness: Kristen Manning is a 83 y.o. female ***   Activities of Daily Living:  Patient reports morning stiffness for *** {minute/hour:19697}.   Patient {ACTIONS;DENIES/REPORTS:21021675::"Denies"} nocturnal pain.  Difficulty dressing/grooming: {ACTIONS;DENIES/REPORTS:21021675::"Denies"} Difficulty climbing stairs: {ACTIONS;DENIES/REPORTS:21021675::"Denies"} Difficulty getting out of chair: {ACTIONS;DENIES/REPORTS:21021675::"Denies"} Difficulty using hands for taps, buttons, cutlery, and/or writing: {ACTIONS;DENIES/REPORTS:21021675::"Denies"}   No Rheumatology ROS completed.   PMFS History:  Patient Active Problem List   Diagnosis Date Noted  . Tension-type headache, not intractable 09/16/2016  . GERD (gastroesophageal reflux disease) 09/11/2016  . ANA positive 09/02/2016  . Myalgia 06/26/2016  . Chest pain 08/01/2015  . Shortness of breath 03/22/2015  . History of total knee replacement, bilateral 03/20/2015  . Hypothyroidism 09/21/2014  . Cephalalgia 09/15/2014  . Gout 06/20/2014  . Skin infection 06/20/2014  . Lower leg edema 06/13/2014  . Popliteal pain 06/13/2014  . Pain in both feet 06/13/2014  . Chest wall pain 04/29/2014  . Sinusitis, acute maxillary 12/24/2013  . Meningioma ([place]) 10/07/2013  . Chronic tension-type headache, intractable 10/07/2013  . Redness-Right Leg 08/11/2013  . Pain of right lower leg 08/11/2013  . Lymphangitis, acute, lower leg 08/11/2013  . Neck pain 07/28/2013  . Swelling of limb 07/07/2013  . Headache(784.0) 07/07/2013  . Depression 06/23/2013  . Insomnia 05/26/2013  . Arthritis of knee, right 11/26/2012  . Routine general medical  examination at a health care facility 10/17/2011  . Sinusitis 09/19/2011  . HTN (hypertension) 07/03/2011  . Hyperlipidemia 07/03/2011  . Venous insufficiency, peripheral 07/03/2011    Past Medical History:  Diagnosis Date  . Arthritis   . Bacterial infection    in lungs in Jan 2015  . Bilateral lower extremity edema   . Cancer ([place])    skin cancer, back, legs melonama  . Chronic back pain   . GERD (gastroesophageal reflux disease)    takes Omeprazole daily  . Headache(784.0)   . History of migraine    last one about 15 yrs ago  . Hyperlipidemia    takes Zetia daily  . Hypertension    takes HCTZ daily  . Joint pain   . Joint swelling   . Myocardial infarction [facility])    pt states EKG always shows infarct but no change from previous ekg;never knew anything about it  . Nocturia   . Osteoarthritis   . Pneumonia    hx of-many yrs ago  . Pneumonia   . PONV (postoperative nausea and vomiting)   . Shortness of breath    with exertion  . Urinary frequency   . Urinary incontinence   . Urinary urgency     Family History  Problem Relation Age of Onset  . Kidney disease Mother   . Heart disease Mother   . Hypertension Mother   . Varicose Veins Mother   . Kidney failure Mother   . Alcohol abuse Father   . Stroke Father        several   . Hypertension Father   . Varicose Veins Father   . Alcohol abuse Brother   .  Hyperlipidemia Brother   . Hypertension Brother   . Early death Brother   . Varicose Veins Brother   . Heart disease Brother   . Hypertension Brother   . Arthritis Brother        Gout  . Varicose Veins Son   . Deep vein thrombosis Son   . Breast cancer Maternal Aunt    Past Surgical History:  Procedure Laterality Date  . bladder tack  1998  . BLEPHAROPLASTY Bilateral   . JOINT REPLACEMENT Left 10/02/2009  . JOINT REPLACEMENT Right 02-22-13   Knee  . KNEE SURGERY Bilateral 2009 and 2011  . OOPHORECTOMY  1998   only one  . TONSILLECTOMY  as a child ?  date  . TOTAL KNEE ARTHROPLASTY Right 02/22/2013   DR Ronnie Derby  . TOTAL KNEE ARTHROPLASTY Right 02/22/2013   Procedure: TOTAL KNEE ARTHROPLASTY;  Surgeon: Vickey Huger, MD;  Location: [place];  Service: Orthopedics;  Laterality: Right;   Social History   Social History Narrative  . No narrative on file     Objective: Vital Signs: There were no vitals taken for this visit.   Physical Exam   Musculoskeletal Exam: ***  CDAI Exam: No CDAI exam completed.    Investigation: No additional findings. CBC Latest Ref Rng & Units 09/06/2016 07/30/2016 06/26/2016  WBC 4.0 - 10.5 K/uL 9.0 7.8 12.3(H)  Hemoglobin 12.0 - 15.0 g/dL 11.8(L) 12.4 12.3  Hematocrit 36.0 - 46.0 % 35.8(L) 37.7 37.4  Platelets 150 - 400 K/uL 330 349.0 344.0   CMP     Component Value Date/Time   NA 137 09/06/2016 1655   K 3.9 09/06/2016 1655   CL 101 09/06/2016 1655   CO2 26 09/06/2016 1655   GLUCOSE 95 09/06/2016 1655   BUN 24 (H) 09/06/2016 1655   CREATININE 1.34 (H) 09/06/2016 1655   CREATININE 1.05 (H) 08/16/2015 1039   CALCIUM 9.0 09/06/2016 1655   PROT 7.5 09/06/2016 1655   ALBUMIN 4.0 09/06/2016 1655   AST 25 09/06/2016 1655   ALT 17 09/06/2016 1655   ALKPHOS 90 09/06/2016 1655   BILITOT 0.5 09/06/2016 1655   GFRNONAA 36 (L) 09/06/2016 1655   GFRAA 42 (L) 09/06/2016 1655   ANA negative, ENA negative, beta 2 negative, anticardiolipin IgM 70, lupus anticoagulant negative, C3-C4 normal, RF negative, anti-CCP negative, uric acid 5.7, ESR 32 Imaging: No results found.  Speciality Comments: No specialty comments available.    Procedures:  No procedures performed Allergies: Aspirin and Codeine   Assessment / Plan:     Visit Diagnoses: No diagnosis found.    Orders: No orders of the defined types were placed in this encounter.  No orders of the defined types were placed in this encounter.   Face-to-face time spent with patient was *** minutes. 50% of time was spent in counseling and  coordination of care.  Follow-Up Instructions: No Follow-up on file.   Bo Merino, MD  Note - This record has been created using Editor, commissioning.  Chart creation errors have been sought, but may not always  have been located. Such creation errors do not reflect on  the standard of medical care.

## 2016-10-09 ENCOUNTER — Ambulatory Visit: Payer: Medicare Other | Admitting: Rheumatology

## 2016-10-09 ENCOUNTER — Telehealth: Payer: Self-pay | Admitting: Rheumatology

## 2016-10-09 ENCOUNTER — Encounter (HOSPITAL_BASED_OUTPATIENT_CLINIC_OR_DEPARTMENT_OTHER): Payer: Self-pay

## 2016-10-09 ENCOUNTER — Emergency Department (HOSPITAL_BASED_OUTPATIENT_CLINIC_OR_DEPARTMENT_OTHER): Payer: Medicare Other

## 2016-10-09 ENCOUNTER — Telehealth: Payer: Self-pay | Admitting: Cardiovascular Disease

## 2016-10-09 ENCOUNTER — Emergency Department (HOSPITAL_BASED_OUTPATIENT_CLINIC_OR_DEPARTMENT_OTHER)
Admission: EM | Admit: 2016-10-09 | Discharge: 2016-10-09 | Disposition: A | Discharge status: Home / Self Care | Payer: Medicare Other | Attending: Emergency Medicine | Admitting: Emergency Medicine

## 2016-10-09 DIAGNOSIS — I10 Essential (primary) hypertension: Secondary | ICD-10-CM | POA: Insufficient documentation

## 2016-10-09 DIAGNOSIS — I252 Old myocardial infarction: Secondary | ICD-10-CM | POA: Insufficient documentation

## 2016-10-09 DIAGNOSIS — R51 Headache: Secondary | ICD-10-CM | POA: Insufficient documentation

## 2016-10-09 DIAGNOSIS — R7989 Other specified abnormal findings of blood chemistry: Secondary | ICD-10-CM | POA: Diagnosis not present

## 2016-10-09 DIAGNOSIS — Z79899 Other long term (current) drug therapy: Secondary | ICD-10-CM | POA: Insufficient documentation

## 2016-10-09 DIAGNOSIS — E039 Hypothyroidism, unspecified: Secondary | ICD-10-CM | POA: Diagnosis not present

## 2016-10-09 DIAGNOSIS — R110 Nausea: Secondary | ICD-10-CM | POA: Diagnosis not present

## 2016-10-09 DIAGNOSIS — R519 Headache, unspecified: Secondary | ICD-10-CM

## 2016-10-09 DIAGNOSIS — R269 Unspecified abnormalities of gait and mobility: Secondary | ICD-10-CM | POA: Diagnosis not present

## 2016-10-09 DIAGNOSIS — R55 Syncope and collapse: Secondary | ICD-10-CM | POA: Diagnosis present

## 2016-10-09 DIAGNOSIS — R030 Elevated blood-pressure reading, without diagnosis of hypertension: Secondary | ICD-10-CM | POA: Diagnosis not present

## 2016-10-09 LAB — CBC
HCT: 36.3 % (ref 36.0–46.0)
Hemoglobin: 11.9 g/dL — ABNORMAL LOW (ref 12.0–15.0)
MCH: 30.3 pg (ref 26.0–34.0)
MCHC: 32.8 g/dL (ref 30.0–36.0)
MCV: 92.4 fL (ref 78.0–100.0)
Platelets: 331 10*3/uL (ref 150–400)
RBC: 3.93 MIL/uL (ref 3.87–5.11)
RDW: 14.4 % (ref 11.5–15.5)
WBC: 10.7 10*3/uL — ABNORMAL HIGH (ref 4.0–10.5)

## 2016-10-09 LAB — HEPATIC FUNCTION PANEL
ALT: 21 U/L (ref 14–54)
AST: 38 U/L (ref 15–41)
Albumin: 3.9 g/dL (ref 3.5–5.0)
Alkaline Phosphatase: 81 U/L (ref 38–126)
Bilirubin, Direct: 0.1 mg/dL (ref 0.1–0.5)
Indirect Bilirubin: 0.5 mg/dL (ref 0.3–0.9)
Total Bilirubin: 0.6 mg/dL (ref 0.3–1.2)
Total Protein: 7.6 g/dL (ref 6.5–8.1)

## 2016-10-09 LAB — CBG MONITORING, ED: Glucose-Capillary: 94 mg/dL (ref 65–99)

## 2016-10-09 LAB — TSH: TSH: 1.486 u[IU]/mL (ref 0.350–4.500)

## 2016-10-09 LAB — BASIC METABOLIC PANEL
Anion gap: 7 (ref 5–15)
BUN: 30 mg/dL — ABNORMAL HIGH (ref 6–20)
CO2: 25 mmol/L (ref 22–32)
Calcium: 9 mg/dL (ref 8.9–10.3)
Chloride: 103 mmol/L (ref 101–111)
Creatinine, Ser: 1.64 mg/dL — ABNORMAL HIGH (ref 0.44–1.00)
GFR calc Af Amer: 33 mL/min — ABNORMAL LOW (ref >60)
GFR calc non Af Amer: 28 mL/min — ABNORMAL LOW (ref >60)
Glucose, Bld: 98 mg/dL (ref 65–99)
Potassium: 4.2 mmol/L (ref 3.5–5.1)
Sodium: 135 mmol/L (ref 135–145)

## 2016-10-09 LAB — LIPASE, BLOOD: Lipase: 26 U/L (ref 11–51)

## 2016-10-09 LAB — PROTIME-INR
INR: 1.08
Prothrombin Time: 13.9 seconds (ref 11.4–15.2)

## 2016-10-09 NOTE — Telephone Encounter (Signed)
Patient called in and canceled her NPT f/u today due to being sick and rescheduled. Patient would like her lab results sent to Dr. Birdie Riddle (PCP) and Dr. Lorie Phenix (cardiologist). I did explain to patient that labs are generally gone over at the NPT f/u.

## 2016-10-09 NOTE — Discharge Instructions (Signed)
We are unsure of the cause of your unsteady episode however, after the imaging and lab testing, we feel you're safe for discharge home after speaking with our neurology team. They recommended following up with your neurologist in the next several days as well as follow-up with your PCP due to the elevated creatinine kidney function. If you begin having any new or worsened symptoms, please return immediately to the nearest emergency department.

## 2016-10-09 NOTE — Telephone Encounter (Signed)
Late entry. Pt called in about 9:30am, Called back & discussed approx 10:00am  Pt expressed that she had transient symptom/possible syncope where she "dozed off" at breakfast around 9am. Notes that she dozes off sometimes but that she usually remembers it. This time she did not remember, and her brother was concerned that it was different than her usual symptoms.  She also reports "dull headache", "my head hurts behind the eyes", describes as a sinus-type pressure which came on after the possible syncope. Pt also reports at first she "couldn't walk right", but that this symptom has resolved. I recommended pt go to ED for evaluation since her symptoms could be associated w stroke. Pt stated she has someone who can drive her to ED for evaluation. She was apologetic stating "I wasn't sure who to call". I gave pt reassurance regarding this & reiterated instruction. Pt verbalized understanding and thanks.

## 2016-10-09 NOTE — ED Notes (Signed)
ED Provider at bedside. 

## 2016-10-09 NOTE — ED Notes (Signed)
Patient requested that blood pressure cuff be taken off it is bothering her. RN Elba Barman notified and BP cuff taken off.

## 2016-10-09 NOTE — ED Triage Notes (Addendum)
Per brother pt ?passed out in chair after breakfast this am-approx 830/9am-pt was unresponsive when name called-pt does not recall events-pt c/o HA, blurred vision, "walk is different"-NAD-slow steady gait into triage

## 2016-10-09 NOTE — Telephone Encounter (Signed)
°  New Prob  Pt requesting to speak to a RN regarding an episode she had this morning. States she was sitting with her brother at breakfast and had a episode of syncope. States her brother attempted to wake her but she did not respond. Pt states episode lasted for about a few seconds. However, she does not recall events of the episode and was given details from her brother. Please call.

## 2016-10-09 NOTE — ED Provider Notes (Signed)
[facility] Provider Note   CSN: [id number] Arrival date & time: 10/09/16  1119     History   Chief Complaint Chief Complaint  Patient presents with  . Loss of Consciousness    HPI Kristen Manning is a 83 y.o. female.    The history is provided by the patient and medical records. No language interpreter was used.  Neurologic Problem  This is a new problem. The current episode started yesterday. The problem occurs rarely. The problem has been resolved. Associated symptoms include headaches. Pertinent negatives include no chest pain, no abdominal pain and no shortness of breath. Nothing aggravates the symptoms. Nothing relieves the symptoms. She has tried nothing for the symptoms. The treatment provided no relief.    Past Medical History:  Diagnosis Date  . Arthritis   . Bacterial infection    in lungs in Jan 2015  . Bilateral lower extremity edema   . Cancer ([place])    skin cancer, back, legs melonama  . Chronic back pain   . GERD (gastroesophageal reflux disease)    takes Omeprazole daily  . Headache(784.0)   . History of migraine    last one about 15 yrs ago  . Hyperlipidemia    takes Zetia daily  . Hypertension    takes HCTZ daily  . Joint pain   . Joint swelling   . Myocardial infarction [facility])    pt states EKG always shows infarct but no change from previous ekg;never knew anything about it  . Nocturia   . Osteoarthritis   . Pneumonia    hx of-many yrs ago  . Pneumonia   . PONV (postoperative nausea and vomiting)   . Shortness of breath    with exertion  . Urinary frequency   . Urinary incontinence   . Urinary urgency     Patient Active Problem List   Diagnosis Date Noted  . DDD (degenerative disc disease), lumbar 10/07/2016  . Primary osteoarthritis of both hands 10/07/2016  . Primary osteoarthritis of both feet 10/07/2016  . Tension-type headache, not intractable 09/16/2016  . GERD (gastroesophageal reflux disease) 09/11/2016  . ANA  positive 09/02/2016  . Myalgia 06/26/2016  . Chest pain 08/01/2015  . Shortness of breath 03/22/2015  . History of total knee replacement, bilateral 03/20/2015  . Hypothyroidism 09/21/2014  . Cephalalgia 09/15/2014  . Gout 06/20/2014  . Skin infection 06/20/2014  . Lower leg edema 06/13/2014  . Popliteal pain 06/13/2014  . Pain in both feet 06/13/2014  . Chest wall pain 04/29/2014  . Sinusitis, acute maxillary 12/24/2013  . Meningioma ([place]) 10/07/2013  . Chronic tension-type headache, intractable 10/07/2013  . Redness-Right Leg 08/11/2013  . Pain of right lower leg 08/11/2013  . Lymphangitis, acute, lower leg 08/11/2013  . Neck pain 07/28/2013  . Swelling of limb 07/07/2013  . Headache(784.0) 07/07/2013  . Depression 06/23/2013  . Insomnia 05/26/2013  . Routine general medical examination at a health care facility 10/17/2011  . Sinusitis 09/19/2011  . HTN (hypertension) 07/03/2011  . Hyperlipidemia 07/03/2011  . Venous insufficiency, peripheral 07/03/2011    Past Surgical History:  Procedure Laterality Date  . bladder tack  1998  . BLEPHAROPLASTY Bilateral   . JOINT REPLACEMENT Left 10/02/2009  . JOINT REPLACEMENT Right 02-22-13   Knee  . KNEE SURGERY Bilateral 2009 and 2011  . OOPHORECTOMY  1998   only one  . TONSILLECTOMY  as a child ? date  . TOTAL KNEE ARTHROPLASTY Right 02/22/2013   DR Ronnie Derby  .  TOTAL KNEE ARTHROPLASTY Right 02/22/2013   Procedure: TOTAL KNEE ARTHROPLASTY;  Surgeon: Vickey Huger, MD;  Location: [facility];  Service: Orthopedics;  Laterality: Right;    OB History    No data available       Home Medications    Prior to Admission medications   Medication Sig Start Date End Date Taking? Authorizing Provider  Gabapentin (NEURONTIN PO) Take by mouth.   Yes [provider]  acetaminophen (TYLENOL) 500 MG tablet Take 1 tablet (500 mg total) by mouth every 8 (eight) hours as needed for moderate pain. 09/11/16   Midge Minium, MD  allopurinol  (ZYLOPRIM) 300 MG tablet Take 300 mg by mouth daily. 06/27/15   [provider]  BEE POLLEN PO Take by mouth.    [provider]  Coenzyme Q10 (CO Q 10 PO) Take by mouth.    [provider]  ezetimibe (ZETIA) 10 MG tablet take 1 tablet by mouth once daily 04/17/16   Midge Minium, MD  fenofibrate 160 MG tablet take 1 tablet by mouth once daily 05/13/16   Midge Minium, MD  furosemide (LASIX) 40 MG tablet Take 40 mg by mouth daily.  08/06/16   [provider]  ibuprofen (ADVIL,MOTRIN) 200 MG tablet Take 200 mg by mouth every 6 (six) hours as needed.    [provider]  levothyroxine (SYNTHROID, LEVOTHROID) 50 MCG tablet take 1 tablet by mouth once daily 11/13/15   Midge Minium, MD  loratadine (CLARITIN) 10 MG tablet Take 10 mg by mouth daily.    [provider]  naproxen sodium (ANAPROX) 220 MG tablet Take 220 mg by mouth 2 (two) times daily with a meal.    [provider]  pantoprazole (PROTONIX) 40 MG tablet Take 1 tablet (40 mg total) by mouth daily. 09/11/16   Midge Minium, MD    Family History Family History  Problem Relation Age of Onset  . Kidney disease Mother   . Heart disease Mother   . Hypertension Mother   . Varicose Veins Mother   . Kidney failure Mother   . Alcohol abuse Father   . Stroke Father        several   . Hypertension Father   . Varicose Veins Father   . Alcohol abuse Brother   . Hyperlipidemia Brother   . Hypertension Brother   . Early death Brother   . Varicose Veins Brother   . Heart disease Brother   . Hypertension Brother   . Arthritis Brother        Gout  . Varicose Veins Son   . Deep vein thrombosis Son   . Breast cancer Maternal Aunt     Social History Social History  Substance Use Topics  . Smoking status: Never Smoker  . Smokeless tobacco: Never Used  . Alcohol use No     Allergies   Aspirin and Codeine   Review of Systems Review of Systems    Constitutional: Negative for chills, diaphoresis, fatigue and fever.  HENT: Negative for congestion.   Eyes: Positive for visual disturbance.  Respiratory: Negative for chest tightness, shortness of breath, wheezing and stridor.   Cardiovascular: Negative for chest pain.  Gastrointestinal: Negative for abdominal pain, constipation, diarrhea and nausea.  Genitourinary: Negative for dysuria and flank pain.  Musculoskeletal: Positive for gait problem (resolved). Negative for back pain, neck pain and neck stiffness.  Skin: Negative for rash and wound.  Neurological: Positive for dizziness and headaches.  Negative for seizures, syncope, speech difficulty, weakness, light-headedness and numbness.  Psychiatric/Behavioral: Negative for agitation.     Physical Exam Updated Vital Signs BP 136/71 (BP Location: Left Arm)   Pulse 76   Temp 97.9 F (36.6 C) (Oral)   Resp 18   Ht 5\' 6"  (1.676 m)   Wt 98 kg (216 lb)   SpO2 96%   BMI 34.86 kg/m   Physical Exam  Constitutional: She is oriented to person, place, and time. She appears well-developed and well-nourished. No distress.  HENT:  Head: Normocephalic.  Mouth/Throat: Oropharynx is clear and moist. No oropharyngeal exudate.  Eyes: Pupils are equal, round, and reactive to light. Conjunctivae and EOM are normal.  Cardiovascular: Normal rate and intact distal pulses.   No murmur heard. Pulmonary/Chest: Effort normal. No stridor. No respiratory distress. She has no wheezes. She exhibits no tenderness.  Abdominal: Bowel sounds are normal. She exhibits no distension.  Musculoskeletal: She exhibits no edema or tenderness.  Neurological: She is alert and oriented to person, place, and time. She is not disoriented. She displays no tremor. No cranial nerve deficit or sensory deficit. She exhibits normal muscle tone. She displays no seizure activity. Coordination and gait normal. GCS eye subscore is 4. GCS verbal subscore is 5. GCS motor subscore is  6.  Unremarkable Neuro exam. Normal gait and neg romberg.   Skin: Skin is warm. Capillary refill takes less than 2 seconds. No rash noted. She is not diaphoretic. No erythema.  Nursing note and vitals reviewed.    ED Treatments / Results  Labs (all labs ordered are listed, but only abnormal results are displayed) Labs Reviewed  BASIC METABOLIC PANEL - Abnormal; Notable for the following:       Result Value   BUN 30 (*)    Creatinine, Ser 1.64 (*)    GFR calc non Af Amer 28 (*)    GFR calc Af Amer 33 (*)    All other components within normal limits  CBC - Abnormal; Notable for the following:    WBC 10.7 (*)    Hemoglobin 11.9 (*)    All other components within normal limits  PROTIME-INR  HEPATIC FUNCTION PANEL  LIPASE, BLOOD  TSH  CBG MONITORING, ED    EKG  EKG Interpretation  Date/Time:  Wednesday October 09 2016 11:39:49 EDT Ventricular Rate:  72 PR Interval:    QRS Duration: 111 QT Interval:  395 QTC Calculation: 433 R Axis:   -21 Text Interpretation:  Sinus rhythm Borderline left axis deviation Low voltage, precordial leads Borderline T abnormalities, anterior leads When compared to prior, no significant changes seen.  No STEMI Confirmed by Antony Blackbird [phone]) on 10/09/2016 11:57:22 AM       Radiology Ct Head Wo Contrast  Result Date: 10/09/2016 CLINICAL DATA:  Altered mental status with headache and blurred vision EXAM: CT HEAD WITHOUT CONTRAST TECHNIQUE: Contiguous axial images were obtained from the base of the skull through the vertex without intravenous contrast. COMPARISON:  None. FINDINGS: Brain: There is age related volume loss. There is no intracranial mass, hemorrhage, extra-axial fluid collection, or midline shift. There is small vessel disease in the centra semiovale bilaterally. No acute infarct evident. Elsewhere gray-white compartments appear normal. Vascular: No evident hyperdense vessel. There is calcification in each carotid siphon region. There  is also mild calcification in each distal vertebral artery. Skull: There are small exostoses along the frontal bone. Bony calvarium appears intact. Sinuses/Orbits: There is mucosal thickening in several ethmoid air  cells bilaterally. There is evidence of previous antrostomy on the right. Other visualized paranasal sinuses are clear. Visualized orbits appear symmetric bilaterally. Other: Mastoid air cells are clear. IMPRESSION: Age-related volume loss with patchy small vessel disease in the centra semiovale bilaterally. No intracranial mass, hemorrhage, or extra-axial fluid collection. No acute appearing infarct. There are foci of arterial vascular calcification. There is mild paranasal sinus disease. Electronically Signed   By: Lowella Grip III M.D.   On: 10/09/2016 12:32    Procedures Procedures (including critical care time)  Medications Ordered in ED Medications - No data to display   Initial Impression / Assessment and Plan / ED Course  I have reviewed the triage vital signs and the nursing notes.  Pertinent labs & imaging results that were available during my care of the patient were reviewed by me and considered in my medical decision making (see chart for details).    Kristen Manning is a 83 y.o. female with a past medical history significant for hypertension, hyperlipidemia, multiple skin melanomas, GERD,  Hypothyroidism, CAD with MI, and prior intracranial meningioma who presents with transient gait abnormality, transient blurry vision, mild headache, nausea, and somnolence. Patient is accompanied by brother who reports that after breakfast this morning, patient nodded off to sleep. He reports that this is normal for after dinner but not breakfast. He says that he woke her up and she responded however after he went to the bathroom and returned she was still sitting there. She was again able to wake up but does not remember the previous conversation. Patient then tried to ambulate and  was having difficulty. She felt unsteady and says that she "could not walk straight". She says that this is abnormal for her. She also reports onset of headache which he describes as mild to moderate, and bilateral blurry vision. She reports that the gait abnormality and blurry vision have resolved but she is still having some mild headache and nausea. She denies recent traumatic head injuries. She denies other symptoms including no speech difficulties, weakness, or numbness anywhere in the body. No facial droop. She reports that she was last normal at 9 AM but is back to her baseline from a neurologic standpoint at this time.  Patient denies history of TIA or stroke. She does report that her both of her parents had stroke in the past. Patient says that she was normal when she got up this morning and ate breakfast.  Patient's brother reports patient was never unresponsive just somnolent but arousable.  On my exam, patient had no gait abnormality. Patient was able to stand still with a negative Romberg. Patient had normal coordination, sensation, and strength and alternatives and face. No numbness seen. Normal extraocular movements and normal pupil exam. Patient denied any blurry vision or diplopia. Lungs clear and chest nontender. Abdomen nontender. Unremarkable exam.  Given patient's transient unsteadiness with gait abnormality and blurry vision, patient will have workup to look for TIA.  Chart review was performed and patient was found to have MRI several years ago revealing a meningioma in her left temporal lobe as well as possible aneurysm versus PCA infundibulum in the past. Patient is not on blood thinners at this time.  Patient will have screening laboratory testing. Anticipate speaking with neurology after initial workup to determine disposition.  Laboratory testing was grossly reassuring aside from the creatinine elevated from prior. A mild leukocytosis and similar anemia prior. CT of the head  showed no acute abnormality on top of her  chronic disease.  Neurology was called. After their review of her imaging and history, they felt that given symptom resolution, patient is safe to be discharged and follow-up with her neurologist whom she has seen in the last few weeks. They feel this is unlikely to have been a TIA given the constellation of symptoms.  Patient is feeling much better and has had no symptoms here in the emergency department. Patient agrees with plan of staying hydrated and discharge with her close neurology follow-up in several days. Patient voiced clear understanding of return precautions for any new or worsened symptoms. Patient will also follow-up with PCP for creatinine monitoring. Patient and family had no questions or concerns and agree with discharge. Patient discharged in good condition.   Final Clinical Impressions(s) / ED Diagnoses   Final diagnoses:  Gait abnormality  Nausea  Nonintractable headache, unspecified chronicity pattern, unspecified headache type  Elevated serum creatinine    New Prescriptions Discharge Medication List as of 10/09/2016  2:29 PM      Clinical Impression: 1. Gait abnormality   2. Nausea   3. Nonintractable headache, unspecified chronicity pattern, unspecified headache type   4. Elevated serum creatinine     Disposition: Discharge  Condition: Good  I have discussed the results, Dx and Tx plan with the pt(& family if present). He/she/they expressed understanding and agree(s) with the plan. Discharge instructions discussed at great length. Strict return precautions discussed and pt &/or family have verbalized understanding of the instructions. No further questions at time of discharge.    Discharge Medication List as of 10/09/2016  2:29 PM      Follow Up: Midge Minium, MD [address] [place] [address] [phone]  Schedule an appointment as soon as possible for a visit    Cameron Sprang, MD [place] [address] [place] [address] [phone]  Schedule an appointment as soon as possible for a visit    [facility] [address] [id number] Eula Fried [place] [phone]  If symptoms worsen     Tracy Kinner, Gwenyth Allegra, MD 10/09/16 [phone]

## 2016-10-10 ENCOUNTER — Encounter: Payer: Self-pay | Admitting: Physician Assistant

## 2016-10-10 ENCOUNTER — Ambulatory Visit (INDEPENDENT_AMBULATORY_CARE_PROVIDER_SITE_OTHER): Payer: Medicare Other | Admitting: Physician Assistant

## 2016-10-10 VITALS — BP 118/70 | HR 70 | Temp 98.1°F | Resp 14 | Ht 66.0 in | Wt 215.0 lb

## 2016-10-10 DIAGNOSIS — R269 Unspecified abnormalities of gait and mobility: Secondary | ICD-10-CM

## 2016-10-10 DIAGNOSIS — R7989 Other specified abnormal findings of blood chemistry: Secondary | ICD-10-CM | POA: Diagnosis not present

## 2016-10-10 DIAGNOSIS — R0982 Postnasal drip: Secondary | ICD-10-CM | POA: Diagnosis not present

## 2016-10-10 LAB — BASIC METABOLIC PANEL
BUN: 27 mg/dL — ABNORMAL HIGH (ref 6–23)
CO2: 29 mEq/L (ref 19–32)
Calcium: 9.6 mg/dL (ref 8.4–10.5)
Chloride: 102 mEq/L (ref 96–112)
Creatinine, Ser: 1.53 mg/dL — ABNORMAL HIGH (ref 0.40–1.20)
GFR: 34.54 mL/min — ABNORMAL LOW (ref >60.00)
Glucose, Bld: 88 mg/dL (ref 70–99)
Potassium: 4.9 mEq/L (ref 3.5–5.1)
Sodium: 139 mEq/L (ref 135–145)

## 2016-10-10 MED ORDER — FLUTICASONE PROPIONATE 50 MCG/ACT NA SUSP: 2.0000 | Freq: Every day | NASAL | 6 refills | Status: DC

## 2016-10-10 NOTE — Progress Notes (Signed)
Pre visit review using our clinic review tool, if applicable. No additional management support is needed unless otherwise documented below in the visit note. 

## 2016-10-10 NOTE — Patient Instructions (Signed)
Please continue allergy medications. Start the [facility] daily as directed. I also recommend saline nasal rinses.  Please take the Tizanidine as directed by Dr. Delice Lesch for muscle tension in the neck and shoulders. Apply a topical Icy Hot to the neck.   Make sure to follow-up with Dr. Delice Lesch as scheduled. Follow-up with me next week for reassessment.   If any recurrence of symptoms, please go to the ER.

## 2016-10-13 NOTE — Progress Notes (Signed)
Patient presents to clinic today for ER follow-up. Patient presented to ER on 10/09/2016 with c/o transient gait abnormality, blurry vision, nausea and headache. Patient's brother had noted that after patient had breakfast she nodded off to sleep and it was slightly harder than usual to rouse her from sleep. Had initial gait disturbance and other symptoms resolved by time of ER assessment. ER assessment included workup for potential TIA. Labs revealed incidentally increased creatinine from baseline and her stable chronic anemia. CT head was negative for acute abnormality. Neurology consult was received and they doubted any TIA as cause of her symptoms. Patient remained asymptomatic and was discharged home with close PCP and Neurology follow-up.  Since discharge, patient endorses doing very well overall. Denies hypersomnolence. Denies any recurrent symptoms. Has noted some mild nasal drainage and nasal congestion. Also noting some tension in her shoulders. Has Rx for tizanidine but has not taken.   Past Medical History:  Diagnosis Date  . Arthritis   . Bacterial infection    in lungs in Jan 2015  . Bilateral lower extremity edema   . Cancer ([place])    skin cancer, back, legs melonama  . Chronic back pain   . GERD (gastroesophageal reflux disease)    takes Omeprazole daily  . Headache(784.0)   . History of migraine    last one about 15 yrs ago  . Hyperlipidemia    takes Zetia daily  . Hypertension    takes HCTZ daily  . Joint pain   . Joint swelling   . Myocardial infarction [facility])    pt states EKG always shows infarct but no change from previous ekg;never knew anything about it  . Nocturia   . Osteoarthritis   . Pneumonia    hx of-many yrs ago  . Pneumonia   . PONV (postoperative nausea and vomiting)   . Shortness of breath    with exertion  . Urinary frequency   . Urinary incontinence   . Urinary urgency     Current Outpatient Prescriptions on File Prior to Visit  Medication  Sig Dispense Refill  . acetaminophen (TYLENOL) 500 MG tablet Take 1 tablet (500 mg total) by mouth every 8 (eight) hours as needed for moderate pain. 90 tablet 1  . allopurinol (ZYLOPRIM) 300 MG tablet Take 300 mg by mouth daily.  0  . BEE POLLEN PO Take by mouth.    . Coenzyme Q10 (CO Q 10 PO) Take by mouth.    . ezetimibe (ZETIA) 10 MG tablet take 1 tablet by mouth once daily 30 tablet 5  . fenofibrate 160 MG tablet take 1 tablet by mouth once daily 30 tablet 6  . furosemide (LASIX) 40 MG tablet Take 40 mg by mouth daily.   0  . ibuprofen (ADVIL,MOTRIN) 200 MG tablet Take 200 mg by mouth every 6 (six) hours as needed.    [name] levothyroxine (SYNTHROID, LEVOTHROID) 50 MCG tablet take 1 tablet by mouth once daily 30 tablet 6  . loratadine (CLARITIN) 10 MG tablet Take 10 mg by mouth daily.    . naproxen sodium (ANAPROX) 220 MG tablet Take 220 mg by mouth 2 (two) times daily with a meal.    . pantoprazole (PROTONIX) 40 MG tablet Take 1 tablet (40 mg total) by mouth daily. 30 tablet 3   No current facility-administered medications on file prior to visit.     Allergies  Allergen Reactions  . Aspirin Other (See Comments)    Noted caused 2 holes in  retina, not sure   . Codeine Hives    Family History  Problem Relation Age of Onset  . Kidney disease Mother   . Heart disease Mother   . Hypertension Mother   . Varicose Veins Mother   . Kidney failure Mother   . Alcohol abuse Father   . Stroke Father        several   . Hypertension Father   . Varicose Veins Father   . Alcohol abuse Brother   . Hyperlipidemia Brother   . Hypertension Brother   . Early death Brother   . Varicose Veins Brother   . Heart disease Brother   . Hypertension Brother   . Arthritis Brother        Gout  . Varicose Veins Son   . Deep vein thrombosis Son   . Breast cancer Maternal Aunt     Social History   Social History  . Marital status: Widowed    Spouse name: N/A  . Number of children: N/A  . Years  of education: N/A   Social History Main Topics  . Smoking status: Never Smoker  . Smokeless tobacco: Never Used  . Alcohol use No  . Drug use: No  . Sexual activity: Not Asked   Other Topics Concern  . None   Social History Narrative  . None    Review of Systems - See HPI.  All other ROS are negative.  BP 118/70   Pulse 70   Temp 98.1 F (36.7 C) (Oral)   Resp 14   Ht 5' 6" (1.676 m)   Wt 215 lb (97.5 kg)   SpO2 97%   BMI 34.70 kg/m   Physical Exam  Constitutional: She is oriented to person, place, and time and well-developed, well-nourished, and in no distress.  HENT:  Head: Normocephalic and atraumatic.  Right Ear: External ear normal.  Left Ear: External ear normal.  Nose: Nose normal.  Mouth/Throat: Oropharynx is clear and moist.  TM within normal limits bilaterally.  Eyes: Pupils are equal, round, and reactive to light. Conjunctivae and EOM are normal.  Neck: Neck supple.  Cardiovascular: Normal rate, regular rhythm, normal heart sounds and intact distal pulses.   Pulmonary/Chest: Effort normal and breath sounds normal. No respiratory distress. She has no wheezes. She has no rales. She exhibits no tenderness.  Lymphadenopathy:    She has no cervical adenopathy.  Neurological: She is alert and oriented to person, place, and time. No cranial nerve deficit.  Skin: Skin is warm and dry. No rash noted.  Vitals reviewed.  Recent Results (from the past 2160 hour(s))  Lipid panel     Status: None   Collection Time: 07/30/16  9:58 AM  Result Value Ref Range   Cholesterol 151 0 - 200 mg/dL    Comment: ATP III Classification       Desirable:  < 200 mg/dL               Borderline High:  200 - 239 mg/dL          High:  > = 240 mg/dL   Triglycerides 91.0 0.0 - 149.0 mg/dL    Comment: Normal:  <150 mg/dLBorderline High:  150 - 199 mg/dL   HDL 56.80 >39.00 mg/dL   VLDL 18.2 0.0 - 40.0 mg/dL   LDL Cholesterol 76 0 - 99 mg/dL   Total CHOL/HDL Ratio 3     Comment:  Men          Women1/2 Average Risk     3.4          3.3Average Risk          5.0          4.42X Average Risk          9.6          7.13X Average Risk          15.0          11.0                       NonHDL 94.03     Comment: NOTE:  Non-HDL goal should be 30 mg/dL higher than patient's LDL goal (i.e. LDL goal of < 70 mg/dL, would have non-HDL goal of < 100 mg/dL)  Basic metabolic panel     Status: Abnormal   Collection Time: 07/30/16  9:58 AM  Result Value Ref Range   Sodium 138 135 - 145 mEq/L   Potassium 5.2 (H) 3.5 - 5.1 mEq/L   Chloride 103 96 - 112 mEq/L   CO2 29 19 - 32 mEq/L   Glucose, Bld 87 70 - 99 mg/dL   BUN 20 6 - 23 mg/dL   Creatinine, Ser 1.36 (H) 0.40 - 1.20 mg/dL   Calcium 9.8 8.4 - 10.5 mg/dL   GFR 39.58 (L) >60.00 mL/min  TSH     Status: None   Collection Time: 07/30/16  9:58 AM  Result Value Ref Range   TSH 3.29 0.35 - 4.50 uIU/mL  Hepatic function panel     Status: None   Collection Time: 07/30/16  9:58 AM  Result Value Ref Range   Total Bilirubin 0.7 0.2 - 1.2 mg/dL   Bilirubin, Direct 0.2 0.0 - 0.3 mg/dL   Alkaline Phosphatase 81 39 - 117 U/L   AST 21 0 - 37 U/L   ALT 13 0 - 35 U/L   Total Protein 6.9 6.0 - 8.3 g/dL   Albumin 3.8 3.5 - 5.2 g/dL  CBC with Differential/Platelet     Status: None   Collection Time: 07/30/16  9:58 AM  Result Value Ref Range   WBC 7.8 4.0 - 10.5 K/uL   RBC 4.06 3.87 - 5.11 Mil/uL   Hemoglobin 12.4 12.0 - 15.0 g/dL   HCT 37.7 36.0 - 46.0 %   MCV 92.7 78.0 - 100.0 fl   MCHC 32.8 30.0 - 36.0 g/dL   RDW 15.5 11.5 - 15.5 %   Platelets 349.0 150.0 - 400.0 K/uL   Neutrophils Relative % 66.5 43.0 - 77.0 %   Lymphocytes Relative 23.0 12.0 - 46.0 %   Monocytes Relative 7.5 3.0 - 12.0 %   Eosinophils Relative 2.2 0.0 - 5.0 %   Basophils Relative 0.8 0.0 - 3.0 %   Neutro Abs 5.2 1.4 - 7.7 K/uL   Lymphs Abs 1.8 0.7 - 4.0 K/uL   Monocytes Absolute 0.6 0.1 - 1.0 K/uL   Eosinophils Absolute 0.2 0.0 - 0.7 K/uL   Basophils  Absolute 0.1 0.0 - 0.1 K/uL  Urinalysis, Routine w reflex microscopic     Status: Abnormal   Collection Time: 09/04/16  9:27 AM  Result Value Ref Range   Color, Urine YELLOW YELLOW   APPearance CLEAR CLEAR   Specific Gravity, Urine 1.014 1.001 - 1.035   pH 7.5 5.0 - 8.0   Glucose, UA NEGATIVE NEGATIVE   Bilirubin  Urine NEGATIVE NEGATIVE   Ketones, ur NEGATIVE NEGATIVE   Hgb urine dipstick NEGATIVE NEGATIVE   Protein, ur NEGATIVE NEGATIVE   Nitrite NEGATIVE NEGATIVE   Leukocytes, UA 1+ (A) NEGATIVE  Sedimentation rate     Status: Abnormal   Collection Time: 09/04/16  9:27 AM  Result Value Ref Range   Sed Rate 32 (H) 0 - 30 mm/hr  Antinuclear Antib (ANA)     Status: None   Collection Time: 09/04/16  9:27 AM  Result Value Ref Range   Anit Nuclear Antibody(ANA) NEG NEGATIVE  [id number] ENA PANEL     Status: None   Collection Time: 09/04/16  9:27 AM  Result Value Ref Range   ds DNA Ab <1 IU/mL    Comment:                                 IU/mL       Interpretation                               < or = 4    Negative                               5-9         Indeterminate                               > or = 10   Positive      Ribonucleic Protein(ENA) Antibody, IgG <1.0 NEG <1.0 NEG AI   Scleroderma (Scl-70) (ENA) Antibody, IgG <1.0 NEG <1.0 NEG AI   SSA (Ro) (ENA) Antibody, IgG <1.0 NEG <1.0 NEG AI   SSB (La) (ENA) Antibody, IgG <1.0 NEG <1.0 NEG AI   ENA SM Ab Ser-aCnc <1.0 NEG <1.0 NEG AI  C3 and C4     Status: None   Collection Time: 09/04/16  9:27 AM  Result Value Ref Range   C3 Complement 169 Not estab mg/dL   C4 Complement 24 Not estab mg/dL  Beta-2 glycoprotein antibodies     Status: None   Collection Time: 09/04/16  9:27 AM  Result Value Ref Range   Beta-2 Glyco I IgG <9 <=20 SGU   Beta-2-Glycoprotein I IgM 9 <=20 SMU   Beta-2-Glycoprotein I IgA <9 <=20 SAU    Comment:   The Antiphospholipid Antibody Syndrome (APS) is a clinical-pathologic correlation that includes  a clinical event (e.g. thrombosis, pregnancy loss, thrombocytopenia) and persistent positive Antiphospholipid Antibodies (IgM or IgG ACA >40 MPL/GPL, IgM or IgG anti-B2GPI antibodies, or a Lupus Anticoagulant). The IgA isotype has been implicated in smaller studies, but have not yet been incorporated into the APS criteria. International consensus guidelines suggest waiting at least 12 weeks before retesting to confirm antibody persistence. Reference J Thromb Haemost 2006: 4; 295   For more information on this test, go to http://education.[facility].com/faq/FAQ109   Cardiolipin antibodies, IgG, IgM, IgA     Status: Abnormal   Collection Time: 09/04/16  9:27 AM  Result Value Ref Range   Anticardiolipin IgA <11 APL    Comment:  Value      Interpretation                                 -------     --------------                               < or = 11     Negative                                 12 - 20     Indeterminate                                 21 - 80     Low to Medium Positive                                    > 80     High Positive    Anticardiolipin IgG <14 GPL    Comment:                                    Value      Interpretation                                 -------     --------------                               < or = 14     Negative                                 15 - 20     Indeterminate                                 21 - 80     Low to Medium Positive                                    > 80     High Positive    Anticardiolipin IgM 70 (H) MPL    Comment:                                    Value      Interpretation                                 -------     --------------                               < or = 12     Negative  13 - 20     Indeterminate                                 21 - 80     Low to Medium Positive                                    > 80     High Positive > = 40 MPL  is a risk factor for thrombosis and pregnancy loss   The antiphospholipid antibody syndrome (APS) is a clinical-pathologic correlation that includes a clinical event (e.g. thrombosis, pregnancy loss, thrombocytopenia) and persistent positive antiphospholipid antibodies (IgM or IgG ACA >40 MPL/GPL, IgM or IgG anti-b2GPI antibodies or a lupus anticoagulant). The IgA isotype has been implicated in smaller studies, but have not yet been incorporated into the APS criteria. International consensus guidelines suggest waiting at least 12 weeks before retesting to confirm antibody persistence. Reference: J Thromb Haem ost 2006: 4; 295   Lupus anticoagulant panel     Status: None   Collection Time: 09/04/16  9:27 AM  Result Value Ref Range   Lupus Anticoagulant Eval REPORT     Comment: A Lupus Anticoaguant is not detected. Common causes for a prolonged screen and negative confirmatory test include factor deficiencies or anticoagulant therapy. Reference Range:  Not Detected http://education.[facility].com/faq/LupusAnticoag ------------------------------------------------------- This interpretation is based on the following test results.   Uric acid     Status: None   Collection Time: 09/04/16  9:27 AM  Result Value Ref Range   Uric Acid, Serum 5.7 2.5 - 7.0 mg/dL  Rheumatoid Factor     Status: None   Collection Time: 09/04/16  9:27 AM  Result Value Ref Range   Rhuematoid fact SerPl-aCnc <73 <53 IU/mL  Cyclic citrul peptide antibody, IgG     Status: None   Collection Time: 09/04/16  9:27 AM  Result Value Ref Range   Cyclic Citrullin Peptide Ab <16 Units    Comment:   Reference Range Negative               < 20 Weak Positive            20 - 39 Moderate Positive        40 - 59 Strong Positive        > 59   rfx dRVVT Scr w/rflx Conf 1:1 Mix     Status: None   Collection Time: 09/04/16  9:27 AM  Result Value Ref Range   dRVVT Mix Interp. REPORT     Comment: Not Indicated    dRVVT Screen 39 <=45 sec   dRVVT REPORT     Comment: Additional testing is not indicated.  rflx hexagonal Phase Confirm     Status: None   Collection Time: 09/04/16  9:27 AM  Result Value Ref Range   Hexagonal Phase Confirm Negative Negative  rfx PTT-LA w/rfx to Hex Phase Conf     Status: Abnormal   Collection Time: 09/04/16  9:27 AM  Result Value Ref Range   PTT-LA Screen 43 (H) <=40 sec   Additional Testing REPORT     Comment: Not indicated  Urine Microscopic     Status: None   Collection Time: 09/04/16  9:27 AM  Result Value Ref Range   WBC, UA 0-5 <=5 WBC/HPF   RBC / HPF 0-2 <=  2 RBC/HPF   Squamous Epithelial / LPF 0-5 <=5 HPF   Bacteria, UA NONE SEEN NONE SEEN HPF   Crystals NONE SEEN NONE SEEN HPF   Casts NONE SEEN NONE SEEN LPF   Yeast NONE SEEN NONE SEEN HPF  Comprehensive metabolic panel     Status: Abnormal   Collection Time: 09/06/16  4:55 PM  Result Value Ref Range   Sodium 137 135 - 145 mmol/L   Potassium 3.9 3.5 - 5.1 mmol/L   Chloride 101 101 - 111 mmol/L   CO2 26 22 - 32 mmol/L   Glucose, Bld 95 65 - 99 mg/dL   BUN 24 (H) 6 - 20 mg/dL   Creatinine, Ser 1.34 (H) 0.44 - 1.00 mg/dL   Calcium 9.0 8.9 - 10.3 mg/dL   Total Protein 7.5 6.5 - 8.1 g/dL   Albumin 4.0 3.5 - 5.0 g/dL   AST 25 15 - 41 U/L   ALT 17 14 - 54 U/L   Alkaline Phosphatase 90 38 - 126 U/L   Total Bilirubin 0.5 0.3 - 1.2 mg/dL   GFR calc non Af Amer 36 (L) >60 mL/min   GFR calc Af Amer 42 (L) >60 mL/min    Comment: (NOTE) The eGFR has been calculated using the CKD EPI equation. This calculation has not been validated in all clinical situations. eGFR's persistently <60 mL/min signify possible Chronic Kidney Disease.    Anion gap 10 5 - 15  Lipase, blood     Status: None   Collection Time: 09/06/16  4:55 PM  Result Value Ref Range   Lipase 31 11 - 51 U/L  CBC with Diff     Status: Abnormal   Collection Time: 09/06/16  4:55 PM  Result Value Ref Range   WBC 9.0 4.0 - 10.5 K/uL   RBC 3.92  3.87 - 5.11 MIL/uL   Hemoglobin 11.8 (L) 12.0 - 15.0 g/dL   HCT 35.8 (L) 36.0 - 46.0 %   MCV 91.3 78.0 - 100.0 fL   MCH 30.1 26.0 - 34.0 pg   MCHC 33.0 30.0 - 36.0 g/dL   RDW 14.6 11.5 - 15.5 %   Platelets 330 150 - 400 K/uL   Neutrophils Relative % 65 %   Neutro Abs 5.8 1.7 - 7.7 K/uL   Lymphocytes Relative 23 %   Lymphs Abs 2.1 0.7 - 4.0 K/uL   Monocytes Relative 10 %   Monocytes Absolute 0.9 0.1 - 1.0 K/uL   Eosinophils Relative 2 %   Eosinophils Absolute 0.2 0.0 - 0.7 K/uL   Basophils Relative 0 %   Basophils Absolute 0.0 0.0 - 0.1 K/uL  Urinalysis, Routine w reflex microscopic     Status: Abnormal   Collection Time: 09/06/16  4:55 PM  Result Value Ref Range   Color, Urine YELLOW YELLOW   APPearance CLEAR CLEAR   Specific Gravity, Urine <1.005 (L) 1.005 - 1.030   pH 7.0 5.0 - 8.0   Glucose, UA NEGATIVE NEGATIVE mg/dL   Hgb urine dipstick TRACE (A) NEGATIVE   Bilirubin Urine NEGATIVE NEGATIVE   Ketones, ur NEGATIVE NEGATIVE mg/dL   Protein, ur NEGATIVE NEGATIVE mg/dL   Nitrite NEGATIVE NEGATIVE   Leukocytes, UA TRACE (A) NEGATIVE  Urinalysis, Microscopic (reflex)     Status: Abnormal   Collection Time: 09/06/16  4:55 PM  Result Value Ref Range   RBC / HPF NONE SEEN 0 - 5 RBC/hpf   WBC, UA 0-5 0 - 5 WBC/hpf   Bacteria,  UA RARE (A) NONE SEEN   Squamous Epithelial / LPF 0-5 (A) NONE SEEN  Urine culture     Status: Abnormal   Collection Time: 09/06/16  4:55 PM  Result Value Ref Range   Specimen Description URINE, RANDOM    Special Requests NONE    Culture (A)     <10,000 COLONIES/mL INSIGNIFICANT GROWTH Performed at [facility], [place] [address], [place], [place] [address]    Report Status 09/08/2016 FINAL   Basic metabolic panel     Status: Abnormal   Collection Time: 10/09/16 11:40 AM  Result Value Ref Range   Sodium 135 135 - 145 mmol/L   Potassium 4.2 3.5 - 5.1 mmol/L   Chloride 103 101 - 111 mmol/L   CO2 25 22 - 32 mmol/L   Glucose, Bld 98 65 - 99  mg/dL   BUN 30 (H) 6 - 20 mg/dL   Creatinine, Ser 1.64 (H) 0.44 - 1.00 mg/dL   Calcium 9.0 8.9 - 10.3 mg/dL   GFR calc non Af Amer 28 (L) >60 mL/min   GFR calc Af Amer 33 (L) >60 mL/min    Comment: (NOTE) The eGFR has been calculated using the CKD EPI equation. This calculation has not been validated in all clinical situations. eGFR's persistently <60 mL/min signify possible Chronic Kidney Disease.    Anion gap 7 5 - 15  CBC     Status: Abnormal   Collection Time: 10/09/16 11:40 AM  Result Value Ref Range   WBC 10.7 (H) 4.0 - 10.5 K/uL   RBC 3.93 3.87 - 5.11 MIL/uL   Hemoglobin 11.9 (L) 12.0 - 15.0 g/dL   HCT 36.3 36.0 - 46.0 %   MCV 92.4 78.0 - 100.0 fL   MCH 30.3 26.0 - 34.0 pg   MCHC 32.8 30.0 - 36.0 g/dL   RDW 14.4 11.5 - 15.5 %   Platelets 331 150 - 400 K/uL  Protime-INR     Status: None   Collection Time: 10/09/16 11:40 AM  Result Value Ref Range   Prothrombin Time 13.9 11.4 - 15.2 seconds   INR 1.08   Hepatic function panel     Status: None   Collection Time: 10/09/16 11:40 AM  Result Value Ref Range   Total Protein 7.6 6.5 - 8.1 g/dL   Albumin 3.9 3.5 - 5.0 g/dL   AST 38 15 - 41 U/L   ALT 21 14 - 54 U/L   Alkaline Phosphatase 81 38 - 126 U/L   Total Bilirubin 0.6 0.3 - 1.2 mg/dL   Bilirubin, Direct 0.1 0.1 - 0.5 mg/dL   Indirect Bilirubin 0.5 0.3 - 0.9 mg/dL  Lipase, blood     Status: None   Collection Time: 10/09/16 11:40 AM  Result Value Ref Range   Lipase 26 11 - 51 U/L  CBG monitoring, ED     Status: None   Collection Time: 10/09/16 11:42 AM  Result Value Ref Range   Glucose-Capillary 94 65 - 99 mg/dL  TSH     Status: None   Collection Time: 10/09/16 12:55 PM  Result Value Ref Range   TSH 1.486 0.350 - 4.500 uIU/mL    Comment: Performed by a 3rd Generation assay with a functional sensitivity of <=0.01 uIU/mL. Performed at [facility], [place] [address] [place], [place] [address]   Basic metabolic panel     Status: Abnormal   Collection Time:  10/10/16  9:51 AM  Result Value Ref Range  Sodium 139 135 - 145 mEq/L   Potassium 4.9 3.5 - 5.1 mEq/L   Chloride 102 96 - 112 mEq/L   CO2 29 19 - 32 mEq/L   Glucose, Bld 88 70 - 99 mg/dL   BUN 27 (H) 6 - 23 mg/dL   Creatinine, Ser 1.53 (H) 0.40 - 1.20 mg/dL   Calcium 9.6 8.4 - 10.5 mg/dL   GFR 34.54 (L) >60.00 mL/min    Assessment/Plan: 1. Elevated serum creatinine Patient has been hydrating well. Repeat labs today. - Basic metabolic panel  2. Abnormality of gait Resolved by time of ER assessment. Likely related to somnolence at the time. Neuro exam remains unremarkable. Patient ambulating well with use of her chronic assistive device. Neurology follow-up pending. Strict ER precautions given.  3. PND (post-nasal drip) Start Flonase and saline nasal rinses. Follow-up if not resolving.     Leeanne Rio, PA-C

## 2016-10-14 DIAGNOSIS — L821 Other seborrheic keratosis: Secondary | ICD-10-CM | POA: Diagnosis not present

## 2016-10-14 DIAGNOSIS — D2261 Melanocytic nevi of right upper limb, including shoulder: Secondary | ICD-10-CM | POA: Diagnosis not present

## 2016-10-14 DIAGNOSIS — L570 Actinic keratosis: Secondary | ICD-10-CM | POA: Diagnosis not present

## 2016-10-14 DIAGNOSIS — D2262 Melanocytic nevi of left upper limb, including shoulder: Secondary | ICD-10-CM | POA: Diagnosis not present

## 2016-10-15 ENCOUNTER — Ambulatory Visit (INDEPENDENT_AMBULATORY_CARE_PROVIDER_SITE_OTHER): Payer: Medicare Other | Admitting: Physician Assistant

## 2016-10-15 ENCOUNTER — Encounter: Payer: Self-pay | Admitting: Physician Assistant

## 2016-10-15 VITALS — BP 118/60 | HR 80 | Temp 97.6°F | Resp 14 | Ht 66.0 in | Wt 215.0 lb

## 2016-10-15 DIAGNOSIS — R7989 Other specified abnormal findings of blood chemistry: Secondary | ICD-10-CM

## 2016-10-15 DIAGNOSIS — J310 Chronic rhinitis: Secondary | ICD-10-CM

## 2016-10-15 DIAGNOSIS — K219 Gastro-esophageal reflux disease without esophagitis: Secondary | ICD-10-CM | POA: Diagnosis not present

## 2016-10-15 NOTE — Progress Notes (Signed)
Pre visit review using our clinic review tool, if applicable. No additional management support is needed unless otherwise documented below in the visit note. 

## 2016-10-15 NOTE — Telephone Encounter (Signed)
We will review at follow up visit, have sent to Dr Birdie Riddle and Dr Gwenlyn Found per her request.

## 2016-10-15 NOTE — Patient Instructions (Signed)
Please stay well-hydrated. Run a humidifier in the bedroom at night.  Continue your Pantoprazole (Protonix) daily. Get an over-the-counter Zantac to take each evening (150 mg).  Stop the Claritin and Start Xyzal for allergies. Follow the diet below.  Follow-up in 2 weeks.   Food Choices for Gastroesophageal Reflux Disease, Adult When you have gastroesophageal reflux disease (GERD), the foods you eat and your eating habits are very important. Choosing the right foods can help ease your discomfort. What guidelines do I need to follow?  Choose fruits, vegetables, whole grains, and low-fat dairy products.  Choose low-fat meat, fish, and poultry.  Limit fats such as oils, salad dressings, butter, nuts, and avocado.  Keep a food diary. This helps you identify foods that cause symptoms.  Avoid foods that cause symptoms. These may be different for everyone.  Eat small meals often instead of 3 large meals a day.  Eat your meals slowly, in a place where you are relaxed.  Limit fried foods.  Cook foods using methods other than frying.  Avoid drinking alcohol.  Avoid drinking large amounts of liquids with your meals.  Avoid bending over or lying down until 2-3 hours after eating. What foods are not recommended? These are some foods and drinks that may make your symptoms worse: Vegetables Tomatoes. Tomato juice. Tomato and spaghetti sauce. Chili peppers. Onion and garlic. Horseradish. Fruits Oranges, grapefruit, and lemon (fruit and juice). Meats High-fat meats, fish, and poultry. This includes hot dogs, ribs, ham, sausage, salami, and bacon. Dairy Whole milk and chocolate milk. Sour cream. Cream. Butter. Ice cream. Cream cheese. Drinks Coffee and tea. Bubbly (carbonated) drinks or energy drinks. Condiments Hot sauce. Barbecue sauce. Sweets/Desserts Chocolate and cocoa. Donuts. Peppermint and spearmint. Fats and Oils High-fat foods. This includes [place] fries and potato  chips. Other Vinegar. Strong spices. This includes black pepper, white pepper, red pepper, cayenne, curry powder, cloves, ginger, and chili powder. The items listed above may not be a complete list of foods and drinks to avoid. Contact your dietitian for more information. This information is not intended to replace advice given to you by your health care provider. Make sure you discuss any questions you have with your health care provider. Document Released: 06/25/2011 Document Revised: 06/01/2015 Document Reviewed: 10/28/2012 Elsevier Interactive Patient Education  2017 [employer].

## 2016-10-15 NOTE — Progress Notes (Signed)
Patient presents to clinic today for 1 week follow-up. Patient endorses doing very well overall. Tension in shoulders has resolved and she denies any recurrence in somnolence or change in gate. Has seen Neurology. Patient is still dealing with PND and nasal congestion. Denies sinus pain, ear pain or tooth pain. Denies fever, chills or chest congestion. Is taking Claritin. Using Flonase occasionally. Notes increase in GERD symptoms despite use of Protonix. Denies abd pain, nausea or vomiting. Denies change in bladder or bowel habits.   Past Medical History:  Diagnosis Date  . Arthritis   . Bacterial infection    in lungs in Jan 2015  . Bilateral lower extremity edema   . Cancer ([place])    skin cancer, back, legs melonama  . Chronic back pain   . GERD (gastroesophageal reflux disease)    takes Omeprazole daily  . Headache(784.0)   . History of migraine    last one about 15 yrs ago  . Hyperlipidemia    takes Zetia daily  . Hypertension    takes HCTZ daily  . Joint pain   . Joint swelling   . Myocardial infarction [facility])    pt states EKG always shows infarct but no change from previous ekg;never knew anything about it  . Nocturia   . Osteoarthritis   . Pneumonia    hx of-many yrs ago  . Pneumonia   . PONV (postoperative nausea and vomiting)   . Shortness of breath    with exertion  . Urinary frequency   . Urinary incontinence   . Urinary urgency     Current Outpatient Prescriptions on File Prior to Visit  Medication Sig Dispense Refill  . acetaminophen (TYLENOL) 500 MG tablet Take 1 tablet (500 mg total) by mouth every 8 (eight) hours as needed for moderate pain. 90 tablet 1  . allopurinol (ZYLOPRIM) 300 MG tablet Take 300 mg by mouth daily.  0  . BEE POLLEN PO Take by mouth.    . Coenzyme Q10 (CO Q 10 PO) Take by mouth.    . ezetimibe (ZETIA) 10 MG tablet take 1 tablet by mouth once daily 30 tablet 5  . fenofibrate 160 MG tablet take 1 tablet by mouth once daily 30 tablet 6    . fluticasone (FLONASE) 50 MCG/ACT nasal spray Place 2 sprays into both nostrils daily. 16 g 6  . furosemide (LASIX) 40 MG tablet Take 40 mg by mouth daily.   0  . gabapentin (NEURONTIN) 300 MG capsule take 1 capsule by mouth every evening for 7 days then take 1 capsule bid for 7 days then 1 capsule tid  0  . ibuprofen (ADVIL,MOTRIN) 200 MG tablet Take 200 mg by mouth every 6 (six) hours as needed.    [name] levothyroxine (SYNTHROID, LEVOTHROID) 50 MCG tablet take 1 tablet by mouth once daily 30 tablet 6  . loratadine (CLARITIN) 10 MG tablet Take 10 mg by mouth daily.    . naproxen sodium (ANAPROX) 220 MG tablet Take 220 mg by mouth 2 (two) times daily with a meal.    . pantoprazole (PROTONIX) 40 MG tablet Take 1 tablet (40 mg total) by mouth daily. 30 tablet 3  . tiZANidine (ZANAFLEX) 4 MG tablet take 1 tablet by mouth three times a day if needed  0   No current facility-administered medications on file prior to visit.     Allergies  Allergen Reactions  . Aspirin Other (See Comments)    Noted caused 2 holes in  retina, not sure   . Codeine Hives    Family History  Problem Relation Age of Onset  . Kidney disease Mother   . Heart disease Mother   . Hypertension Mother   . Varicose Veins Mother   . Kidney failure Mother   . Alcohol abuse Father   . Stroke Father        several   . Hypertension Father   . Varicose Veins Father   . Alcohol abuse Brother   . Hyperlipidemia Brother   . Hypertension Brother   . Early death Brother   . Varicose Veins Brother   . Heart disease Brother   . Hypertension Brother   . Arthritis Brother        Gout  . Varicose Veins Son   . Deep vein thrombosis Son   . Breast cancer Maternal Aunt     Social History   Social History  . Marital status: Widowed    Spouse name: N/A  . Number of children: N/A  . Years of education: N/A   Social History Main Topics  . Smoking status: Never Smoker  . Smokeless tobacco: Never Used  . Alcohol use No  .  Drug use: No  . Sexual activity: Not Asked   Other Topics Concern  . None   Social History Narrative  . None   Review of Systems - See HPI.  All other ROS are negative.  BP 118/60   Pulse 80   Temp 97.6 F (36.4 C) (Oral)   Resp 14   Ht 5' 6" (1.676 m)   Wt 215 lb (97.5 kg)   SpO2 96%   BMI 34.70 kg/m   Physical Exam  Constitutional: She is well-developed, well-nourished, and in no distress.  HENT:  Head: Normocephalic and atraumatic.  Right Ear: Tympanic membrane normal.  Left Ear: Tympanic membrane normal.  Nose: Mucosal edema and rhinorrhea present. Right sinus exhibits no maxillary sinus tenderness and no frontal sinus tenderness. Left sinus exhibits no maxillary sinus tenderness and no frontal sinus tenderness.  Mouth/Throat: Uvula is midline, oropharynx is clear and moist and mucous membranes are normal.  Eyes: Conjunctivae are normal.  Neck: Neck supple.  Cardiovascular: Normal rate, regular rhythm, normal heart sounds and intact distal pulses.   Pulmonary/Chest: Effort normal and breath sounds normal. No respiratory distress. She has no wheezes. She has no rales. She exhibits no tenderness.  Abdominal: Soft. Bowel sounds are normal. She exhibits no distension. There is no tenderness.  Lymphadenopathy:    She has no cervical adenopathy.  Vitals reviewed.   Recent Results (from the past 2160 hour(s))  Lipid panel     Status: None   Collection Time: 07/30/16  9:58 AM  Result Value Ref Range   Cholesterol 151 0 - 200 mg/dL    Comment: ATP III Classification       Desirable:  < 200 mg/dL               Borderline High:  200 - 239 mg/dL          High:  > = 240 mg/dL   Triglycerides 91.0 0.0 - 149.0 mg/dL    Comment: Normal:  <150 mg/dLBorderline High:  150 - 199 mg/dL   HDL 56.80 >39.00 mg/dL   VLDL 18.2 0.0 - 40.0 mg/dL   LDL Cholesterol 76 0 - 99 mg/dL   Total CHOL/HDL Ratio 3     Comment:  Men          Women1/2 Average Risk     3.4           3.3Average Risk          5.0          4.42X Average Risk          9.6          7.13X Average Risk          15.0          11.0                       NonHDL 94.03     Comment: NOTE:  Non-HDL goal should be 30 mg/dL higher than patient's LDL goal (i.e. LDL goal of < 70 mg/dL, would have non-HDL goal of < 100 mg/dL)  Basic metabolic panel     Status: Abnormal   Collection Time: 07/30/16  9:58 AM  Result Value Ref Range   Sodium 138 135 - 145 mEq/L   Potassium 5.2 (H) 3.5 - 5.1 mEq/L   Chloride 103 96 - 112 mEq/L   CO2 29 19 - 32 mEq/L   Glucose, Bld 87 70 - 99 mg/dL   BUN 20 6 - 23 mg/dL   Creatinine, Ser 1.36 (H) 0.40 - 1.20 mg/dL   Calcium 9.8 8.4 - 10.5 mg/dL   GFR 39.58 (L) >60.00 mL/min  TSH     Status: None   Collection Time: 07/30/16  9:58 AM  Result Value Ref Range   TSH 3.29 0.35 - 4.50 uIU/mL  Hepatic function panel     Status: None   Collection Time: 07/30/16  9:58 AM  Result Value Ref Range   Total Bilirubin 0.7 0.2 - 1.2 mg/dL   Bilirubin, Direct 0.2 0.0 - 0.3 mg/dL   Alkaline Phosphatase 81 39 - 117 U/L   AST 21 0 - 37 U/L   ALT 13 0 - 35 U/L   Total Protein 6.9 6.0 - 8.3 g/dL   Albumin 3.8 3.5 - 5.2 g/dL  CBC with Differential/Platelet     Status: None   Collection Time: 07/30/16  9:58 AM  Result Value Ref Range   WBC 7.8 4.0 - 10.5 K/uL   RBC 4.06 3.87 - 5.11 Mil/uL   Hemoglobin 12.4 12.0 - 15.0 g/dL   HCT 37.7 36.0 - 46.0 %   MCV 92.7 78.0 - 100.0 fl   MCHC 32.8 30.0 - 36.0 g/dL   RDW 15.5 11.5 - 15.5 %   Platelets 349.0 150.0 - 400.0 K/uL   Neutrophils Relative % 66.5 43.0 - 77.0 %   Lymphocytes Relative 23.0 12.0 - 46.0 %   Monocytes Relative 7.5 3.0 - 12.0 %   Eosinophils Relative 2.2 0.0 - 5.0 %   Basophils Relative 0.8 0.0 - 3.0 %   Neutro Abs 5.2 1.4 - 7.7 K/uL   Lymphs Abs 1.8 0.7 - 4.0 K/uL   Monocytes Absolute 0.6 0.1 - 1.0 K/uL   Eosinophils Absolute 0.2 0.0 - 0.7 K/uL   Basophils Absolute 0.1 0.0 - 0.1 K/uL  Urinalysis, Routine w reflex  microscopic     Status: Abnormal   Collection Time: 09/04/16  9:27 AM  Result Value Ref Range   Color, Urine YELLOW YELLOW   APPearance CLEAR CLEAR   Specific Gravity, Urine 1.014 1.001 - 1.035   pH 7.5 5.0 - 8.0   Glucose, UA NEGATIVE NEGATIVE  Bilirubin Urine NEGATIVE NEGATIVE   Ketones, ur NEGATIVE NEGATIVE   Hgb urine dipstick NEGATIVE NEGATIVE   Protein, ur NEGATIVE NEGATIVE   Nitrite NEGATIVE NEGATIVE   Leukocytes, UA 1+ (A) NEGATIVE  Sedimentation rate     Status: Abnormal   Collection Time: 09/04/16  9:27 AM  Result Value Ref Range   Sed Rate 32 (H) 0 - 30 mm/hr  Antinuclear Antib (ANA)     Status: None   Collection Time: 09/04/16  9:27 AM  Result Value Ref Range   Anit Nuclear Antibody(ANA) NEG NEGATIVE  [id number] ENA PANEL     Status: None   Collection Time: 09/04/16  9:27 AM  Result Value Ref Range   ds DNA Ab <1 IU/mL    Comment:                                 IU/mL       Interpretation                               < or = 4    Negative                               5-9         Indeterminate                               > or = 10   Positive      Ribonucleic Protein(ENA) Antibody, IgG <1.0 NEG <1.0 NEG AI   Scleroderma (Scl-70) (ENA) Antibody, IgG <1.0 NEG <1.0 NEG AI   SSA (Ro) (ENA) Antibody, IgG <1.0 NEG <1.0 NEG AI   SSB (La) (ENA) Antibody, IgG <1.0 NEG <1.0 NEG AI   ENA SM Ab Ser-aCnc <1.0 NEG <1.0 NEG AI  C3 and C4     Status: None   Collection Time: 09/04/16  9:27 AM  Result Value Ref Range   C3 Complement 169 Not estab mg/dL   C4 Complement 24 Not estab mg/dL  Beta-2 glycoprotein antibodies     Status: None   Collection Time: 09/04/16  9:27 AM  Result Value Ref Range   Beta-2 Glyco I IgG <9 <=20 SGU   Beta-2-Glycoprotein I IgM 9 <=20 SMU   Beta-2-Glycoprotein I IgA <9 <=20 SAU    Comment:   The Antiphospholipid Antibody Syndrome (APS) is a clinical-pathologic correlation that includes a clinical event (e.g. thrombosis, pregnancy loss,  thrombocytopenia) and persistent positive Antiphospholipid Antibodies (IgM or IgG ACA >40 MPL/GPL, IgM or IgG anti-B2GPI antibodies, or a Lupus Anticoagulant). The IgA isotype has been implicated in smaller studies, but have not yet been incorporated into the APS criteria. International consensus guidelines suggest waiting at least 12 weeks before retesting to confirm antibody persistence. Reference J Thromb Haemost 2006: 4; 295   For more information on this test, go to http://education.[facility].com/faq/FAQ109   Cardiolipin antibodies, IgG, IgM, IgA     Status: Abnormal   Collection Time: 09/04/16  9:27 AM  Result Value Ref Range   Anticardiolipin IgA <11 APL    Comment:  Value      Interpretation                                 -------     --------------                               < or = 11     Negative                                 12 - 20     Indeterminate                                 21 - 80     Low to Medium Positive                                    > 80     High Positive    Anticardiolipin IgG <14 GPL    Comment:                                    Value      Interpretation                                 -------     --------------                               < or = 14     Negative                                 15 - 20     Indeterminate                                 21 - 80     Low to Medium Positive                                    > 80     High Positive    Anticardiolipin IgM 70 (H) MPL    Comment:                                    Value      Interpretation                                 -------     --------------                               < or = 12     Negative  13 - 20     Indeterminate                                 21 - 80     Low to Medium Positive                                    > 80     High Positive > = 40 MPL is a risk factor for thrombosis and pregnancy loss    The antiphospholipid antibody syndrome (APS) is a clinical-pathologic correlation that includes a clinical event (e.g. thrombosis, pregnancy loss, thrombocytopenia) and persistent positive antiphospholipid antibodies (IgM or IgG ACA >40 MPL/GPL, IgM or IgG anti-b2GPI antibodies or a lupus anticoagulant). The IgA isotype has been implicated in smaller studies, but have not yet been incorporated into the APS criteria. International consensus guidelines suggest waiting at least 12 weeks before retesting to confirm antibody persistence. Reference: J Thromb Haem ost 2006: 4; 295   Lupus anticoagulant panel     Status: None   Collection Time: 09/04/16  9:27 AM  Result Value Ref Range   Lupus Anticoagulant Eval REPORT     Comment: A Lupus Anticoaguant is not detected. Common causes for a prolonged screen and negative confirmatory test include factor deficiencies or anticoagulant therapy. Reference Range:  Not Detected http://education.[facility].com/faq/LupusAnticoag ------------------------------------------------------- This interpretation is based on the following test results.   Uric acid     Status: None   Collection Time: 09/04/16  9:27 AM  Result Value Ref Range   Uric Acid, Serum 5.7 2.5 - 7.0 mg/dL  Rheumatoid Factor     Status: None   Collection Time: 09/04/16  9:27 AM  Result Value Ref Range   Rhuematoid fact SerPl-aCnc <84 <16 IU/mL  Cyclic citrul peptide antibody, IgG     Status: None   Collection Time: 09/04/16  9:27 AM  Result Value Ref Range   Cyclic Citrullin Peptide Ab <16 Units    Comment:   Reference Range Negative               < 20 Weak Positive            20 - 39 Moderate Positive        40 - 59 Strong Positive        > 59   rfx dRVVT Scr w/rflx Conf 1:1 Mix     Status: None   Collection Time: 09/04/16  9:27 AM  Result Value Ref Range   dRVVT Mix Interp. REPORT     Comment: Not Indicated   dRVVT Screen 39 <=45 sec   dRVVT REPORT     Comment:  Additional testing is not indicated.  rflx hexagonal Phase Confirm     Status: None   Collection Time: 09/04/16  9:27 AM  Result Value Ref Range   Hexagonal Phase Confirm Negative Negative  rfx PTT-LA w/rfx to Hex Phase Conf     Status: Abnormal   Collection Time: 09/04/16  9:27 AM  Result Value Ref Range   PTT-LA Screen 43 (H) <=40 sec   Additional Testing REPORT     Comment: Not indicated  Urine Microscopic     Status: None   Collection Time: 09/04/16  9:27 AM  Result Value Ref Range   WBC, UA 0-5 <=5 WBC/HPF   RBC / HPF 0-2 <=  2 RBC/HPF   Squamous Epithelial / LPF 0-5 <=5 HPF   Bacteria, UA NONE SEEN NONE SEEN HPF   Crystals NONE SEEN NONE SEEN HPF   Casts NONE SEEN NONE SEEN LPF   Yeast NONE SEEN NONE SEEN HPF  Comprehensive metabolic panel     Status: Abnormal   Collection Time: 09/06/16  4:55 PM  Result Value Ref Range   Sodium 137 135 - 145 mmol/L   Potassium 3.9 3.5 - 5.1 mmol/L   Chloride 101 101 - 111 mmol/L   CO2 26 22 - 32 mmol/L   Glucose, Bld 95 65 - 99 mg/dL   BUN 24 (H) 6 - 20 mg/dL   Creatinine, Ser 1.34 (H) 0.44 - 1.00 mg/dL   Calcium 9.0 8.9 - 10.3 mg/dL   Total Protein 7.5 6.5 - 8.1 g/dL   Albumin 4.0 3.5 - 5.0 g/dL   AST 25 15 - 41 U/L   ALT 17 14 - 54 U/L   Alkaline Phosphatase 90 38 - 126 U/L   Total Bilirubin 0.5 0.3 - 1.2 mg/dL   GFR calc non Af Amer 36 (L) >60 mL/min   GFR calc Af Amer 42 (L) >60 mL/min    Comment: (NOTE) The eGFR has been calculated using the CKD EPI equation. This calculation has not been validated in all clinical situations. eGFR's persistently <60 mL/min signify possible Chronic Kidney Disease.    Anion gap 10 5 - 15  Lipase, blood     Status: None   Collection Time: 09/06/16  4:55 PM  Result Value Ref Range   Lipase 31 11 - 51 U/L  CBC with Diff     Status: Abnormal   Collection Time: 09/06/16  4:55 PM  Result Value Ref Range   WBC 9.0 4.0 - 10.5 K/uL   RBC 3.92 3.87 - 5.11 MIL/uL   Hemoglobin 11.8 (L) 12.0 - 15.0  g/dL   HCT 35.8 (L) 36.0 - 46.0 %   MCV 91.3 78.0 - 100.0 fL   MCH 30.1 26.0 - 34.0 pg   MCHC 33.0 30.0 - 36.0 g/dL   RDW 14.6 11.5 - 15.5 %   Platelets 330 150 - 400 K/uL   Neutrophils Relative % 65 %   Neutro Abs 5.8 1.7 - 7.7 K/uL   Lymphocytes Relative 23 %   Lymphs Abs 2.1 0.7 - 4.0 K/uL   Monocytes Relative 10 %   Monocytes Absolute 0.9 0.1 - 1.0 K/uL   Eosinophils Relative 2 %   Eosinophils Absolute 0.2 0.0 - 0.7 K/uL   Basophils Relative 0 %   Basophils Absolute 0.0 0.0 - 0.1 K/uL  Urinalysis, Routine w reflex microscopic     Status: Abnormal   Collection Time: 09/06/16  4:55 PM  Result Value Ref Range   Color, Urine YELLOW YELLOW   APPearance CLEAR CLEAR   Specific Gravity, Urine <1.005 (L) 1.005 - 1.030   pH 7.0 5.0 - 8.0   Glucose, UA NEGATIVE NEGATIVE mg/dL   Hgb urine dipstick TRACE (A) NEGATIVE   Bilirubin Urine NEGATIVE NEGATIVE   Ketones, ur NEGATIVE NEGATIVE mg/dL   Protein, ur NEGATIVE NEGATIVE mg/dL   Nitrite NEGATIVE NEGATIVE   Leukocytes, UA TRACE (A) NEGATIVE  Urinalysis, Microscopic (reflex)     Status: Abnormal   Collection Time: 09/06/16  4:55 PM  Result Value Ref Range   RBC / HPF NONE SEEN 0 - 5 RBC/hpf   WBC, UA 0-5 0 - 5 WBC/hpf   Bacteria,  UA RARE (A) NONE SEEN   Squamous Epithelial / LPF 0-5 (A) NONE SEEN  Urine culture     Status: Abnormal   Collection Time: 09/06/16  4:55 PM  Result Value Ref Range   Specimen Description URINE, RANDOM    Special Requests NONE    Culture (A)     <10,000 COLONIES/mL INSIGNIFICANT GROWTH Performed at [facility], [place] [address] [place], [place] [address]    Report Status 09/08/2016 FINAL   Basic metabolic panel     Status: Abnormal   Collection Time: 10/09/16 11:40 AM  Result Value Ref Range   Sodium 135 135 - 145 mmol/L   Potassium 4.2 3.5 - 5.1 mmol/L   Chloride 103 101 - 111 mmol/L   CO2 25 22 - 32 mmol/L   Glucose, Bld 98 65 - 99 mg/dL   BUN 30 (H) 6 - 20 mg/dL   Creatinine, Ser 1.64  (H) 0.44 - 1.00 mg/dL   Calcium 9.0 8.9 - 10.3 mg/dL   GFR calc non Af Amer 28 (L) >60 mL/min   GFR calc Af Amer 33 (L) >60 mL/min    Comment: (NOTE) The eGFR has been calculated using the CKD EPI equation. This calculation has not been validated in all clinical situations. eGFR's persistently <60 mL/min signify possible Chronic Kidney Disease.    Anion gap 7 5 - 15  CBC     Status: Abnormal   Collection Time: 10/09/16 11:40 AM  Result Value Ref Range   WBC 10.7 (H) 4.0 - 10.5 K/uL   RBC 3.93 3.87 - 5.11 MIL/uL   Hemoglobin 11.9 (L) 12.0 - 15.0 g/dL   HCT 36.3 36.0 - 46.0 %   MCV 92.4 78.0 - 100.0 fL   MCH 30.3 26.0 - 34.0 pg   MCHC 32.8 30.0 - 36.0 g/dL   RDW 14.4 11.5 - 15.5 %   Platelets 331 150 - 400 K/uL  Protime-INR     Status: None   Collection Time: 10/09/16 11:40 AM  Result Value Ref Range   Prothrombin Time 13.9 11.4 - 15.2 seconds   INR 1.08   Hepatic function panel     Status: None   Collection Time: 10/09/16 11:40 AM  Result Value Ref Range   Total Protein 7.6 6.5 - 8.1 g/dL   Albumin 3.9 3.5 - 5.0 g/dL   AST 38 15 - 41 U/L   ALT 21 14 - 54 U/L   Alkaline Phosphatase 81 38 - 126 U/L   Total Bilirubin 0.6 0.3 - 1.2 mg/dL   Bilirubin, Direct 0.1 0.1 - 0.5 mg/dL   Indirect Bilirubin 0.5 0.3 - 0.9 mg/dL  Lipase, blood     Status: None   Collection Time: 10/09/16 11:40 AM  Result Value Ref Range   Lipase 26 11 - 51 U/L  CBG monitoring, ED     Status: None   Collection Time: 10/09/16 11:42 AM  Result Value Ref Range   Glucose-Capillary 94 65 - 99 mg/dL  TSH     Status: None   Collection Time: 10/09/16 12:55 PM  Result Value Ref Range   TSH 1.486 0.350 - 4.500 uIU/mL    Comment: Performed by a 3rd Generation assay with a functional sensitivity of <=0.01 uIU/mL. Performed at [facility], [place] [address], [place], [place] [address]   Basic metabolic panel     Status: Abnormal   Collection Time: 10/10/16  9:51 AM  Result Value Ref Range  Sodium 139  135 - 145 mEq/L   Potassium 4.9 3.5 - 5.1 mEq/L   Chloride 102 96 - 112 mEq/L   CO2 29 19 - 32 mEq/L   Glucose, Bld 88 70 - 99 mg/dL   BUN 27 (H) 6 - 23 mg/dL   Creatinine, Ser 1.53 (H) 0.40 - 1.20 mg/dL   Calcium 9.6 8.4 - 10.5 mg/dL   GFR 34.54 (L) >60.00 mL/min   Assessment/Plan: 1. Chronic rhinitis Start Xyzal and resume Flonase. Follow-up if not resolving.  2. Gastroesophageal reflux disease without esophagitis Worsened by rhinitis. Resume Protonix. Add on nightly Zantac 150 mg. GERD diet.   3. Elevated serum creatinine Improved on last check. Repeat today to assess return to normal. - Basic metabolic panel  Follow-up 2 weeks.  Leeanne Rio, PA-C

## 2016-10-16 ENCOUNTER — Ambulatory Visit
Admission: RE | Admit: 2016-10-16 | Discharge: 2016-10-16 | Disposition: A | Discharge status: Home / Self Care | Payer: Medicare Other | Source: Ambulatory Visit | Attending: Family Medicine | Admitting: Family Medicine

## 2016-10-16 ENCOUNTER — Encounter: Payer: Self-pay | Admitting: General Practice

## 2016-10-16 DIAGNOSIS — Z780 Asymptomatic menopausal state: Secondary | ICD-10-CM | POA: Diagnosis not present

## 2016-10-16 DIAGNOSIS — E2839 Other primary ovarian failure: Secondary | ICD-10-CM

## 2016-10-16 DIAGNOSIS — M8589 Other specified disorders of bone density and structure, multiple sites: Secondary | ICD-10-CM | POA: Diagnosis not present

## 2016-10-20 ENCOUNTER — Emergency Department (HOSPITAL_BASED_OUTPATIENT_CLINIC_OR_DEPARTMENT_OTHER)
Admission: EM | Admit: 2016-10-20 | Discharge: 2016-10-20 | Disposition: A | Discharge status: Home / Self Care | Payer: Medicare Other | Attending: Emergency Medicine | Admitting: Emergency Medicine

## 2016-10-20 ENCOUNTER — Encounter (HOSPITAL_BASED_OUTPATIENT_CLINIC_OR_DEPARTMENT_OTHER): Payer: Self-pay | Admitting: Emergency Medicine

## 2016-10-20 DIAGNOSIS — T887XXA Unspecified adverse effect of drug or medicament, initial encounter: Secondary | ICD-10-CM | POA: Insufficient documentation

## 2016-10-20 DIAGNOSIS — Z96653 Presence of artificial knee joint, bilateral: Secondary | ICD-10-CM | POA: Insufficient documentation

## 2016-10-20 DIAGNOSIS — I10 Essential (primary) hypertension: Secondary | ICD-10-CM | POA: Diagnosis not present

## 2016-10-20 DIAGNOSIS — Y658 Other specified misadventures during surgical and medical care: Secondary | ICD-10-CM | POA: Diagnosis not present

## 2016-10-20 DIAGNOSIS — T50905A Adverse effect of unspecified drugs, medicaments and biological substances, initial encounter: Secondary | ICD-10-CM | POA: Diagnosis not present

## 2016-10-20 DIAGNOSIS — I252 Old myocardial infarction: Secondary | ICD-10-CM | POA: Diagnosis not present

## 2016-10-20 DIAGNOSIS — E039 Hypothyroidism, unspecified: Secondary | ICD-10-CM | POA: Insufficient documentation

## 2016-10-20 DIAGNOSIS — Z85828 Personal history of other malignant neoplasm of skin: Secondary | ICD-10-CM | POA: Insufficient documentation

## 2016-10-20 DIAGNOSIS — R410 Disorientation, unspecified: Secondary | ICD-10-CM | POA: Insufficient documentation

## 2016-10-20 DIAGNOSIS — R55 Syncope and collapse: Secondary | ICD-10-CM | POA: Insufficient documentation

## 2016-10-20 DIAGNOSIS — Z79899 Other long term (current) drug therapy: Secondary | ICD-10-CM | POA: Insufficient documentation

## 2016-10-20 DIAGNOSIS — T428X5A Adverse effect of antiparkinsonism drugs and other central muscle-tone depressants, initial encounter: Secondary | ICD-10-CM | POA: Diagnosis not present

## 2016-10-20 LAB — CBC
HCT: 34.8 % — ABNORMAL LOW (ref 36.0–46.0)
Hemoglobin: 11.2 g/dL — ABNORMAL LOW (ref 12.0–15.0)
MCH: 30 pg (ref 26.0–34.0)
MCHC: 32.2 g/dL (ref 30.0–36.0)
MCV: 93.3 fL (ref 78.0–100.0)
Platelets: 316 10*3/uL (ref 150–400)
RBC: 3.73 MIL/uL — ABNORMAL LOW (ref 3.87–5.11)
RDW: 14.4 % (ref 11.5–15.5)
WBC: 9.1 10*3/uL (ref 4.0–10.5)

## 2016-10-20 LAB — BASIC METABOLIC PANEL
Anion gap: 7 (ref 5–15)
BUN: 31 mg/dL — ABNORMAL HIGH (ref 6–20)
CO2: 26 mmol/L (ref 22–32)
Calcium: 8.6 mg/dL — ABNORMAL LOW (ref 8.9–10.3)
Chloride: 105 mmol/L (ref 101–111)
Creatinine, Ser: 1.65 mg/dL — ABNORMAL HIGH (ref 0.44–1.00)
GFR calc Af Amer: 32 mL/min — ABNORMAL LOW (ref >60)
GFR calc non Af Amer: 28 mL/min — ABNORMAL LOW (ref >60)
Glucose, Bld: 106 mg/dL — ABNORMAL HIGH (ref 65–99)
Potassium: 3.9 mmol/L (ref 3.5–5.1)
Sodium: 138 mmol/L (ref 135–145)

## 2016-10-20 LAB — URINALYSIS, ROUTINE W REFLEX MICROSCOPIC
Bilirubin Urine: NEGATIVE
Glucose, UA: NEGATIVE mg/dL
Hgb urine dipstick: NEGATIVE
Ketones, ur: NEGATIVE mg/dL
Nitrite: NEGATIVE
Protein, ur: NEGATIVE mg/dL
Specific Gravity, Urine: 1.01 (ref 1.005–1.030)
pH: 7 (ref 5.0–8.0)

## 2016-10-20 LAB — URINALYSIS, MICROSCOPIC (REFLEX)

## 2016-10-20 LAB — CBG MONITORING, ED: Glucose-Capillary: 92 mg/dL (ref 65–99)

## 2016-10-20 NOTE — Discharge Instructions (Signed)
° ° °  Please discontinue using the Zanaflex 4 muscle spasm. It is likely the cause of these 2 incidents. Follow closely with her primary care physician in the next 2-3 days. Return for the reasons listed below. Get help right away if: You have a severe headache. You have unusual pain in your chest, abdomen, or back. You are bleeding from your mouth or rectum, or you have black or tarry stool. You have a very fast or irregular heartbeat (palpitations). You have pain with breathing. You faint once or repeatedly. You have a seizure. You are confused. You have trouble walking. You have severe weakness. You have vision problems.

## 2016-10-20 NOTE — ED Triage Notes (Signed)
Patient states that she was at home and at 130 and she describes having confusion and then possibly passing out for about 2 -3 minutes. The patient reports that she does not know if she passed out or if fell asleep

## 2016-10-20 NOTE — ED Provider Notes (Signed)
[place] DEPT [facility] Provider Note   CSN: [id number] Arrival date & time: 10/20/16  1408     History   Chief Complaint Chief Complaint  Patient presents with  . Near Syncope    HPI Kristen Manning is a 83 y.o. female he presents emergency Department with chief complaint of presyncope. Patient was seen in the emergency department on 10 March, 2018 for the same complaint. She believes it is related to the Zanaflex she was prescribed by her neurologist. The patient states that on 3 October, 2018 she had taken a dose of her Zanaflex that morning prior to eating breakfast because of cramps in her legs. Her husband states that she fell asleep at the table and had to shake her and wake her up that happened twice and then they came to the emergency department. She had a workup that included head CT which was negative. She denies having had any episodes until today. She was again having cramps, took her Zanaflex at 1 PM at 1:30 she was eating lunch with her husband. He left the room and she states that she became extremely sleepy and confused. She is unsure where she was at the time. When he came in she became very sleepy and since she started falling over like she is going to take a nap. She shook her and she woke up. When she got up from the table she walked over to the sink to wash dishes and states that she felt extremely tired. She told her husband that she felt she needed to go the hospital. She did not lose consciousness. She denies chest pain, racing or skipping in her heart. She did not have any facial droop, slurred speech, unilateral weakness, or vertigo. She has a mild sinus headache at this time. HPI  Past Medical History:  Diagnosis Date  . Arthritis   . Bacterial infection    in lungs in Jan 2015  . Bilateral lower extremity edema   . Cancer ([place])    skin cancer, back, legs melonama  . Chronic back pain   . GERD (gastroesophageal reflux disease)    takes Omeprazole daily  .  Headache(784.0)   . History of migraine    last one about 15 yrs ago  . Hyperlipidemia    takes Zetia daily  . Hypertension    takes HCTZ daily  . Joint pain   . Joint swelling   . Myocardial infarction [facility])    pt states EKG always shows infarct but no change from previous ekg;never knew anything about it  . Nocturia   . Osteoarthritis   . Pneumonia    hx of-many yrs ago  . Pneumonia   . PONV (postoperative nausea and vomiting)   . Shortness of breath    with exertion  . Urinary frequency   . Urinary incontinence   . Urinary urgency     Patient Active Problem List   Diagnosis Date Noted  . DDD (degenerative disc disease), lumbar 10/07/2016  . Primary osteoarthritis of both hands 10/07/2016  . Primary osteoarthritis of both feet 10/07/2016  . Tension-type headache, not intractable 09/16/2016  . GERD (gastroesophageal reflux disease) 09/11/2016  . ANA positive 09/02/2016  . Myalgia 06/26/2016  . Chest pain 08/01/2015  . Shortness of breath 03/22/2015  . History of total knee replacement, bilateral 03/20/2015  . Hypothyroidism 09/21/2014  . Cephalalgia 09/15/2014  . Gout 06/20/2014  . Skin infection 06/20/2014  . Lower leg edema 06/13/2014  . Popliteal pain  06/13/2014  . Pain in both feet 06/13/2014  . Chest wall pain 04/29/2014  . Sinusitis, acute maxillary 12/24/2013  . Meningioma ([place]) 10/07/2013  . Chronic tension-type headache, intractable 10/07/2013  . Redness-Right Leg 08/11/2013  . Pain of right lower leg 08/11/2013  . Lymphangitis, acute, lower leg 08/11/2013  . Neck pain 07/28/2013  . Swelling of limb 07/07/2013  . Headache(784.0) 07/07/2013  . Depression 06/23/2013  . Insomnia 05/26/2013  . Routine general medical examination at a health care facility 10/17/2011  . Sinusitis 09/19/2011  . HTN (hypertension) 07/03/2011  . Hyperlipidemia 07/03/2011  . Venous insufficiency, peripheral 07/03/2011    Past Surgical History:  Procedure Laterality Date    . bladder tack  1998  . BLEPHAROPLASTY Bilateral   . JOINT REPLACEMENT Left 10/02/2009  . JOINT REPLACEMENT Right 02-22-13   Knee  . KNEE SURGERY Bilateral 2009 and 2011  . OOPHORECTOMY  1998   only one  . TONSILLECTOMY  as a child ? date  . TOTAL KNEE ARTHROPLASTY Right 02/22/2013   DR Ronnie Derby  . TOTAL KNEE ARTHROPLASTY Right 02/22/2013   Procedure: TOTAL KNEE ARTHROPLASTY;  Surgeon: Vickey Huger, MD;  Location: [place];  Service: Orthopedics;  Laterality: Right;    OB History    No data available       Home Medications    Prior to Admission medications   Medication Sig Start Date End Date Taking? Authorizing Provider  acetaminophen (TYLENOL) 500 MG tablet Take 1 tablet (500 mg total) by mouth every 8 (eight) hours as needed for moderate pain. 09/11/16   Midge Minium, MD  allopurinol (ZYLOPRIM) 300 MG tablet Take 300 mg by mouth daily. 06/27/15   [provider]  BEE POLLEN PO Take by mouth.    [provider]  Coenzyme Q10 (CO Q 10 PO) Take by mouth.    [provider]  ezetimibe (ZETIA) 10 MG tablet take 1 tablet by mouth once daily 04/17/16   Midge Minium, MD  fenofibrate 160 MG tablet take 1 tablet by mouth once daily 05/13/16   Midge Minium, MD  fluticasone [facility]) 50 MCG/ACT nasal spray Place 2 sprays into both nostrils daily. 10/10/16   Brunetta Jeans, PA-C  furosemide (LASIX) 40 MG tablet Take 40 mg by mouth daily.  08/06/16   [provider]  gabapentin (NEURONTIN) 300 MG capsule take 1 capsule by mouth every evening for 7 days then take 1 capsule bid for 7 days then 1 capsule tid 10/03/16   [provider]  ibuprofen (ADVIL,MOTRIN) 200 MG tablet Take 200 mg by mouth every 6 (six) hours as needed.    [provider]  levothyroxine (SYNTHROID, LEVOTHROID) 50 MCG tablet take 1 tablet by mouth once daily 11/13/15   Midge Minium, MD  loratadine (CLARITIN) 10 MG tablet Take 10 mg by mouth daily.     [provider]  naproxen sodium (ANAPROX) 220 MG tablet Take 220 mg by mouth 2 (two) times daily with a meal.    [provider]  pantoprazole (PROTONIX) 40 MG tablet Take 1 tablet (40 mg total) by mouth daily. 09/11/16   Midge Minium, MD  tiZANidine (ZANAFLEX) 4 MG tablet take 1 tablet by mouth three times a day if needed 10/03/16   [provider]    Family History Family History  Problem Relation Age of Onset  . Kidney disease Mother   . Heart disease Mother   . Hypertension Mother   .  Varicose Veins Mother   . Kidney failure Mother   . Alcohol abuse Father   . Stroke Father        several   . Hypertension Father   . Varicose Veins Father   . Alcohol abuse Brother   . Hyperlipidemia Brother   . Hypertension Brother   . Early death Brother   . Varicose Veins Brother   . Heart disease Brother   . Hypertension Brother   . Arthritis Brother        Gout  . Varicose Veins Son   . Deep vein thrombosis Son   . Breast cancer Maternal Aunt     Social History Social History  Substance Use Topics  . Smoking status: Never Smoker  . Smokeless tobacco: Never Used  . Alcohol use No     Allergies   Aspirin and Codeine   Review of Systems Review of Systems  Ten systems reviewed and are negative for acute change, except as noted in the HPI.   Physical Exam Updated Vital Signs BP 136/69 (BP Location: Left Arm)   Pulse 80   Temp 97.9 F (36.6 C) (Oral)   Resp 20   Ht 5\' 6"  (1.676 m)   Wt 97.5 kg (215 lb)   SpO2 96%   BMI 34.70 kg/m   Physical Exam  Constitutional: She is oriented to person, place, and time. She appears well-developed and well-nourished. No distress.  HENT:  Head: Normocephalic and atraumatic.  Mouth/Throat: Oropharynx is clear and moist.  Eyes: Pupils are equal, round, and reactive to light. Conjunctivae and EOM are normal. No scleral icterus.  No horizontal, vertical or rotational nystagmus  Neck: Normal range of  motion. Neck supple.  Full active and passive ROM without pain No midline or paraspinal tenderness No nuchal rigidity or meningeal signs  Cardiovascular: Normal rate, regular rhythm and intact distal pulses.   Pulmonary/Chest: Effort normal and breath sounds normal. No respiratory distress. She has no wheezes. She has no rales.  Abdominal: Soft. Bowel sounds are normal. There is no tenderness. There is no rebound and no guarding.  Musculoskeletal: Normal range of motion.  Lymphadenopathy:    She has no cervical adenopathy.  Neurological: She is alert and oriented to person, place, and time. No cranial nerve deficit. She exhibits normal muscle tone. Coordination normal.  Mental Status:  Alert, oriented, thought content appropriate. Speech fluent without evidence of aphasia. Able to follow 2 step commands without difficulty.  Cranial Nerves:  II:  Peripheral visual fields grossly normal, pupils equal, round, reactive to light III,IV, VI: ptosis not present, extra-ocular motions intact bilaterally  V,VII: smile symmetric, facial light touch sensation equal VIII: hearing grossly normal bilaterally  IX,X: midline uvula rise  XI: bilateral shoulder shrug equal and strong XII: midline tongue extension  Motor:  5/5 in upper and lower extremities bilaterally including strong and equal grip strength and dorsiflexion/plantar flexion Sensory: Pinprick and light touch normal in all extremities.  Cerebellar: normal finger-to-nose with bilateral upper extremities Gait: normal gait and balance CV: distal pulses palpable throughout   Skin: Skin is warm and dry. No rash noted. She is not diaphoretic.  Psychiatric: She has a normal mood and affect. Her behavior is normal. Judgment and thought content normal.  Nursing note and vitals reviewed.    ED Treatments / Results  Labs (all labs ordered are listed, but only abnormal results are displayed) Labs Reviewed  BASIC METABOLIC PANEL - Abnormal;  Notable for the following:  Result Value   Glucose, Bld 106 (*)    BUN 31 (*)    Creatinine, Ser 1.65 (*)    Calcium 8.6 (*)    GFR calc non Af Amer 28 (*)    GFR calc Af Amer 32 (*)    All other components within normal limits  CBC - Abnormal; Notable for the following:    RBC 3.73 (*)    Hemoglobin 11.2 (*)    HCT 34.8 (*)    All other components within normal limits  URINALYSIS, ROUTINE W REFLEX MICROSCOPIC - Abnormal; Notable for the following:    Leukocytes, UA TRACE (*)    All other components within normal limits  URINALYSIS, MICROSCOPIC (REFLEX) - Abnormal; Notable for the following:    Bacteria, UA MANY (*)    Squamous Epithelial / LPF 0-5 (*)    All other components within normal limits  CBG MONITORING, ED    EKG  EKG Interpretation  Date/Time:  Sunday October 20 2016 14:32:41 EDT Ventricular Rate:  72 PR Interval:    QRS Duration: 111 QT Interval:  407 QTC Calculation: 446 R Axis:   -27 Text Interpretation:  Sinus rhythm Inferior infarct, old Baseline wander in lead(s) V4 similar to previous EKG  t-wave abnoramlities in anterior leads similar to previous on 10/05/09 Confirmed by Brantley Stage [phone]) on 10/20/2016 2:39:43 PM       Radiology No results found.  Procedures Procedures (including critical care time)  Medications Ordered in ED Medications - No data to display   Initial Impression / Assessment and Plan / ED Course  I have reviewed the triage vital signs and the nursing notes.  Pertinent labs & imaging results that were available during my care of the patient were reviewed by me and considered in my medical decision making (see chart for details).    patient with episode of confusion and somnolence likely related to adverse reaction to her Zanaflex prescription as both events have occurred after taking the medication. It is a CNS depressant nd likely not a good medication for this 83 year old female to continue taking. She is back to her  baseline. Do not feel she needs further workup. There is no evidence of CVA. I doubt TIA. Patient appears safe for discharge at this time.Patient seen in shared visit with attending physician. Who agrees with assessment, work up , treatment, and plan for discharge      Final Clinical Impressions(s) / ED Diagnoses   Final diagnoses:  None    New Prescriptions New Prescriptions   No medications on file     Margarita Mail, PA-C 10/20/16 1632    Forde Dandy, MD 10/20/16 1659

## 2016-10-22 ENCOUNTER — Ambulatory Visit (INDEPENDENT_AMBULATORY_CARE_PROVIDER_SITE_OTHER): Payer: Medicare Other | Admitting: Physician Assistant

## 2016-10-22 ENCOUNTER — Encounter: Payer: Self-pay | Admitting: Physician Assistant

## 2016-10-22 VITALS — BP 110/60 | HR 72 | Temp 97.7°F | Resp 14 | Ht 66.0 in | Wt 219.0 lb

## 2016-10-22 DIAGNOSIS — B9689 Other specified bacterial agents as the cause of diseases classified elsewhere: Secondary | ICD-10-CM | POA: Diagnosis not present

## 2016-10-22 DIAGNOSIS — R42 Dizziness and giddiness: Secondary | ICD-10-CM

## 2016-10-22 DIAGNOSIS — R55 Syncope and collapse: Secondary | ICD-10-CM | POA: Diagnosis not present

## 2016-10-22 DIAGNOSIS — J019 Acute sinusitis, unspecified: Secondary | ICD-10-CM | POA: Diagnosis not present

## 2016-10-22 DIAGNOSIS — N289 Disorder of kidney and ureter, unspecified: Secondary | ICD-10-CM

## 2016-10-22 MED ORDER — DOXYCYCLINE HYCLATE 100 MG PO CAPS: 100.0000 mg | ORAL_CAPSULE | Freq: Two times a day (BID) | ORAL | 0 refills | Status: DC

## 2016-10-22 NOTE — Progress Notes (Signed)
Patient presents to clinic today for ER follow-up. Patient was assessed in the ER on 10/20/16 with c/o lightheadedness. Patient had taken one of her tizanidine for shoulder tension/headache prescribed by neurology. After this she noted falling asleep. States her husband had to wake her twice and was worried so brought her to the ER. ER workup included CT scan negative for any acute changes. Zanaflex discontinued. Patient discharged to follow-up if there were any recurrent symptoms.   Since discharge, patient endorses doing well overall. Denies any recurrent lightheadedness, dizziness or hypersomnolence. Has not taken any more tizanidine. Is noting worsening sinus pressure despite medications, now with sinus pain and thick, yellow nasal discharge. Denies fever, chills, chest congestion or cough.  Past Medical History:  Diagnosis Date  . Arthritis   . Bacterial infection    in lungs in Jan 2015  . Bilateral lower extremity edema   . Cancer ([place])    skin cancer, back, legs melonama  . Chronic back pain   . GERD (gastroesophageal reflux disease)    takes Omeprazole daily  . Headache(784.0)   . History of migraine    last one about 15 yrs ago  . Hyperlipidemia    takes Zetia daily  . Hypertension    takes HCTZ daily  . Joint pain   . Joint swelling   . Myocardial infarction [facility])    pt states EKG always shows infarct but no change from previous ekg;never knew anything about it  . Nocturia   . Osteoarthritis   . Pneumonia    hx of-many yrs ago  . Pneumonia   . PONV (postoperative nausea and vomiting)   . Shortness of breath    with exertion  . Urinary frequency   . Urinary incontinence   . Urinary urgency     Current Outpatient Prescriptions on File Prior to Visit  Medication Sig Dispense Refill  . acetaminophen (TYLENOL) 500 MG tablet Take 1 tablet (500 mg total) by mouth every 8 (eight) hours as needed for moderate pain. 90 tablet 1  . allopurinol (ZYLOPRIM) 300 MG tablet  Take 300 mg by mouth daily.  0  . BEE POLLEN PO Take by mouth.    . Coenzyme Q10 (CO Q 10 PO) Take by mouth.    . ezetimibe (ZETIA) 10 MG tablet take 1 tablet by mouth once daily 30 tablet 5  . fenofibrate 160 MG tablet take 1 tablet by mouth once daily 30 tablet 6  . fluticasone (FLONASE) 50 MCG/ACT nasal spray Place 2 sprays into both nostrils daily. 16 g 6  . furosemide (LASIX) 40 MG tablet Take 40 mg by mouth daily.   0  . gabapentin (NEURONTIN) 300 MG capsule take 1 capsule by mouth every evening for 7 days then take 1 capsule bid for 7 days then 1 capsule tid  0  . ibuprofen (ADVIL,MOTRIN) 200 MG tablet Take 200 mg by mouth every 6 (six) hours as needed.    [name] levothyroxine (SYNTHROID, LEVOTHROID) 50 MCG tablet take 1 tablet by mouth once daily 30 tablet 6  . loratadine (CLARITIN) 10 MG tablet Take 10 mg by mouth daily.    . naproxen sodium (ANAPROX) 220 MG tablet Take 220 mg by mouth 2 (two) times daily with a meal.    . pantoprazole (PROTONIX) 40 MG tablet Take 1 tablet (40 mg total) by mouth daily. 30 tablet 3   No current facility-administered medications on file prior to visit.     Allergies  Allergen  Reactions  . Aspirin Other (See Comments)    Noted caused 2 holes in retina, not sure   . Codeine Hives  . Tizanidine Hcl Other (See Comments)    Confusion    Family History  Problem Relation Age of Onset  . Kidney disease Mother   . Heart disease Mother   . Hypertension Mother   . Varicose Veins Mother   . Kidney failure Mother   . Alcohol abuse Father   . Stroke Father        several   . Hypertension Father   . Varicose Veins Father   . Alcohol abuse Brother   . Hyperlipidemia Brother   . Hypertension Brother   . Early death Brother   . Varicose Veins Brother   . Heart disease Brother   . Hypertension Brother   . Arthritis Brother        Gout  . Varicose Veins Son   . Deep vein thrombosis Son   . Breast cancer Maternal Aunt     Social History   Social  History  . Marital status: Widowed    Spouse name: N/A  . Number of children: N/A  . Years of education: N/A   Social History Main Topics  . Smoking status: Never Smoker  . Smokeless tobacco: Never Used  . Alcohol use No  . Drug use: No  . Sexual activity: Not Asked   Other Topics Concern  . None   Social History Narrative  . None   Review of Systems - See HPI.  All other ROS are negative.  BP 110/60   Pulse 72   Temp 97.7 F (36.5 C) (Oral)   Resp 14   Ht 5' 6"  (1.676 m)   Wt 219 lb (99.3 kg)   SpO2 97%   BMI 35.35 kg/m   Physical Exam  Constitutional: She is oriented to person, place, and time and well-developed, well-nourished, and in no distress.  HENT:  Head: Normocephalic and atraumatic.  Right Ear: Tympanic membrane normal.  Left Ear: Tympanic membrane normal.  Nose: Mucosal edema and rhinorrhea present. Right sinus exhibits maxillary sinus tenderness and frontal sinus tenderness. Left sinus exhibits frontal sinus tenderness. Left sinus exhibits no maxillary sinus tenderness.  Mouth/Throat: Uvula is midline, oropharynx is clear and moist and mucous membranes are normal.  Eyes: Conjunctivae are normal.  Neck: Neck supple.  Cardiovascular: Normal rate, regular rhythm, normal heart sounds and intact distal pulses.   Pulmonary/Chest: Effort normal and breath sounds normal. No respiratory distress. She has no wheezes. She has no rales. She exhibits no tenderness.  Lymphadenopathy:    She has no cervical adenopathy.  Neurological: She is alert and oriented to person, place, and time.  Skin: Skin is warm and dry. No rash noted.  Psychiatric: Affect normal.  Vitals reviewed.  Recent Results (from the past 2160 hour(s))  Lipid panel     Status: None   Collection Time: 07/30/16  9:58 AM  Result Value Ref Range   Cholesterol 151 0 - 200 mg/dL    Comment: ATP III Classification       Desirable:  < 200 mg/dL               Borderline High:  200 - 239 mg/dL           High:  > = 240 mg/dL   Triglycerides 91.0 0.0 - 149.0 mg/dL    Comment: Normal:  <150 mg/dLBorderline High:  150 - 199 mg/dL  HDL 56.80 >39.00 mg/dL   VLDL 18.2 0.0 - 40.0 mg/dL   LDL Cholesterol 76 0 - 99 mg/dL   Total CHOL/HDL Ratio 3     Comment:                Men          Women1/2 Average Risk     3.4          3.3Average Risk          5.0          4.42X Average Risk          9.6          7.13X Average Risk          15.0          11.0                       NonHDL 94.03     Comment: NOTE:  Non-HDL goal should be 30 mg/dL higher than patient's LDL goal (i.e. LDL goal of < 70 mg/dL, would have non-HDL goal of < 100 mg/dL)  Basic metabolic panel     Status: Abnormal   Collection Time: 07/30/16  9:58 AM  Result Value Ref Range   Sodium 138 135 - 145 mEq/L   Potassium 5.2 (H) 3.5 - 5.1 mEq/L   Chloride 103 96 - 112 mEq/L   CO2 29 19 - 32 mEq/L   Glucose, Bld 87 70 - 99 mg/dL   BUN 20 6 - 23 mg/dL   Creatinine, Ser 1.36 (H) 0.40 - 1.20 mg/dL   Calcium 9.8 8.4 - 10.5 mg/dL   GFR 39.58 (L) >60.00 mL/min  TSH     Status: None   Collection Time: 07/30/16  9:58 AM  Result Value Ref Range   TSH 3.29 0.35 - 4.50 uIU/mL  Hepatic function panel     Status: None   Collection Time: 07/30/16  9:58 AM  Result Value Ref Range   Total Bilirubin 0.7 0.2 - 1.2 mg/dL   Bilirubin, Direct 0.2 0.0 - 0.3 mg/dL   Alkaline Phosphatase 81 39 - 117 U/L   AST 21 0 - 37 U/L   ALT 13 0 - 35 U/L   Total Protein 6.9 6.0 - 8.3 g/dL   Albumin 3.8 3.5 - 5.2 g/dL  CBC with Differential/Platelet     Status: None   Collection Time: 07/30/16  9:58 AM  Result Value Ref Range   WBC 7.8 4.0 - 10.5 K/uL   RBC 4.06 3.87 - 5.11 Mil/uL   Hemoglobin 12.4 12.0 - 15.0 g/dL   HCT 37.7 36.0 - 46.0 %   MCV 92.7 78.0 - 100.0 fl   MCHC 32.8 30.0 - 36.0 g/dL   RDW 15.5 11.5 - 15.5 %   Platelets 349.0 150.0 - 400.0 K/uL   Neutrophils Relative % 66.5 43.0 - 77.0 %   Lymphocytes Relative 23.0 12.0 - 46.0 %   Monocytes  Relative 7.5 3.0 - 12.0 %   Eosinophils Relative 2.2 0.0 - 5.0 %   Basophils Relative 0.8 0.0 - 3.0 %   Neutro Abs 5.2 1.4 - 7.7 K/uL   Lymphs Abs 1.8 0.7 - 4.0 K/uL   Monocytes Absolute 0.6 0.1 - 1.0 K/uL   Eosinophils Absolute 0.2 0.0 - 0.7 K/uL   Basophils Absolute 0.1 0.0 - 0.1 K/uL  Urinalysis, Routine w reflex microscopic     Status: Abnormal  Collection Time: 09/04/16  9:27 AM  Result Value Ref Range   Color, Urine YELLOW YELLOW   APPearance CLEAR CLEAR   Specific Gravity, Urine 1.014 1.001 - 1.035   pH 7.5 5.0 - 8.0   Glucose, UA NEGATIVE NEGATIVE   Bilirubin Urine NEGATIVE NEGATIVE   Ketones, ur NEGATIVE NEGATIVE   Hgb urine dipstick NEGATIVE NEGATIVE   Protein, ur NEGATIVE NEGATIVE   Nitrite NEGATIVE NEGATIVE   Leukocytes, UA 1+ (A) NEGATIVE  Sedimentation rate     Status: Abnormal   Collection Time: 09/04/16  9:27 AM  Result Value Ref Range   Sed Rate 32 (H) 0 - 30 mm/hr  Antinuclear Antib (ANA)     Status: None   Collection Time: 09/04/16  9:27 AM  Result Value Ref Range   Anit Nuclear Antibody(ANA) NEG NEGATIVE  [id number] ENA PANEL     Status: None   Collection Time: 09/04/16  9:27 AM  Result Value Ref Range   ds DNA Ab <1 IU/mL    Comment:                                 IU/mL       Interpretation                               < or = 4    Negative                               5-9         Indeterminate                               > or = 10   Positive      Ribonucleic Protein(ENA) Antibody, IgG <1.0 NEG <1.0 NEG AI   Scleroderma (Scl-70) (ENA) Antibody, IgG <1.0 NEG <1.0 NEG AI   SSA (Ro) (ENA) Antibody, IgG <1.0 NEG <1.0 NEG AI   SSB (La) (ENA) Antibody, IgG <1.0 NEG <1.0 NEG AI   ENA SM Ab Ser-aCnc <1.0 NEG <1.0 NEG AI  C3 and C4     Status: None   Collection Time: 09/04/16  9:27 AM  Result Value Ref Range   C3 Complement 169 Not estab mg/dL   C4 Complement 24 Not estab mg/dL  Beta-2 glycoprotein antibodies     Status: None   Collection Time:  09/04/16  9:27 AM  Result Value Ref Range   Beta-2 Glyco I IgG <9 <=20 SGU   Beta-2-Glycoprotein I IgM 9 <=20 SMU   Beta-2-Glycoprotein I IgA <9 <=20 SAU    Comment:   The Antiphospholipid Antibody Syndrome (APS) is a clinical-pathologic correlation that includes a clinical event (e.g. thrombosis, pregnancy loss, thrombocytopenia) and persistent positive Antiphospholipid Antibodies (IgM or IgG ACA >40 MPL/GPL, IgM or IgG anti-B2GPI antibodies, or a Lupus Anticoagulant). The IgA isotype has been implicated in smaller studies, but have not yet been incorporated into the APS criteria. International consensus guidelines suggest waiting at least 12 weeks before retesting to confirm antibody persistence. Reference J Thromb Haemost 2006: 4; 295   For more information on this test, go to http://education.[facility].com/faq/FAQ109   Cardiolipin antibodies, IgG, IgM, IgA     Status: Abnormal   Collection Time: 09/04/16  9:27 AM  Result Value Ref Range   Anticardiolipin IgA <11 APL    Comment:                                    Value      Interpretation                                 -------     --------------                               < or = 11     Negative                                 12 - 20     Indeterminate                                 21 - 80     Low to Medium Positive                                    > 80     High Positive    Anticardiolipin IgG <14 GPL    Comment:                                    Value      Interpretation                                 -------     --------------                               < or = 14     Negative                                 15 - 20     Indeterminate                                 21 - 80     Low to Medium Positive                                    > 80     High Positive    Anticardiolipin IgM 70 (H) MPL    Comment:                                    Value      Interpretation                                 -------      --------------                               <  or = 12     Negative                                 13 - 20     Indeterminate                                 21 - 80     Low to Medium Positive                                    > 80     High Positive > = 40 MPL is a risk factor for thrombosis and pregnancy loss   The antiphospholipid antibody syndrome (APS) is a clinical-pathologic correlation that includes a clinical event (e.g. thrombosis, pregnancy loss, thrombocytopenia) and persistent positive antiphospholipid antibodies (IgM or IgG ACA >40 MPL/GPL, IgM or IgG anti-b2GPI antibodies or a lupus anticoagulant). The IgA isotype has been implicated in smaller studies, but have not yet been incorporated into the APS criteria. International consensus guidelines suggest waiting at least 12 weeks before retesting to confirm antibody persistence. Reference: J Thromb Haem ost 2006: 4; 295   Lupus anticoagulant panel     Status: None   Collection Time: 09/04/16  9:27 AM  Result Value Ref Range   Lupus Anticoagulant Eval REPORT     Comment: A Lupus Anticoaguant is not detected. Common causes for a prolonged screen and negative confirmatory test include factor deficiencies or anticoagulant therapy. Reference Range:  Not Detected http://education.[facility].com/faq/LupusAnticoag ------------------------------------------------------- This interpretation is based on the following test results.   Uric acid     Status: None   Collection Time: 09/04/16  9:27 AM  Result Value Ref Range   Uric Acid, Serum 5.7 2.5 - 7.0 mg/dL  Rheumatoid Factor     Status: None   Collection Time: 09/04/16  9:27 AM  Result Value Ref Range   Rhuematoid fact SerPl-aCnc <16 <10 IU/mL  Cyclic citrul peptide antibody, IgG     Status: None   Collection Time: 09/04/16  9:27 AM  Result Value Ref Range   Cyclic Citrullin Peptide Ab <16 Units    Comment:   Reference Range Negative               < 20 Weak  Positive            20 - 39 Moderate Positive        40 - 59 Strong Positive        > 59   rfx dRVVT Scr w/rflx Conf 1:1 Mix     Status: None   Collection Time: 09/04/16  9:27 AM  Result Value Ref Range   dRVVT Mix Interp. REPORT     Comment: Not Indicated   dRVVT Screen 39 <=45 sec   dRVVT REPORT     Comment: Additional testing is not indicated.  rflx hexagonal Phase Confirm     Status: None   Collection Time: 09/04/16  9:27 AM  Result Value Ref Range   Hexagonal Phase Confirm Negative Negative  rfx PTT-LA w/rfx to Hex Phase Conf     Status: Abnormal   Collection Time: 09/04/16  9:27 AM  Result Value Ref Range   PTT-LA Screen 43 (H) <=40 sec   Additional Testing REPORT  Comment: Not indicated  Urine Microscopic     Status: None   Collection Time: 09/04/16  9:27 AM  Result Value Ref Range   WBC, UA 0-5 <=5 WBC/HPF   RBC / HPF 0-2 <=2 RBC/HPF   Squamous Epithelial / LPF 0-5 <=5 HPF   Bacteria, UA NONE SEEN NONE SEEN HPF   Crystals NONE SEEN NONE SEEN HPF   Casts NONE SEEN NONE SEEN LPF   Yeast NONE SEEN NONE SEEN HPF  Comprehensive metabolic panel     Status: Abnormal   Collection Time: 09/06/16  4:55 PM  Result Value Ref Range   Sodium 137 135 - 145 mmol/L   Potassium 3.9 3.5 - 5.1 mmol/L   Chloride 101 101 - 111 mmol/L   CO2 26 22 - 32 mmol/L   Glucose, Bld 95 65 - 99 mg/dL   BUN 24 (H) 6 - 20 mg/dL   Creatinine, Ser 1.34 (H) 0.44 - 1.00 mg/dL   Calcium 9.0 8.9 - 10.3 mg/dL   Total Protein 7.5 6.5 - 8.1 g/dL   Albumin 4.0 3.5 - 5.0 g/dL   AST 25 15 - 41 U/L   ALT 17 14 - 54 U/L   Alkaline Phosphatase 90 38 - 126 U/L   Total Bilirubin 0.5 0.3 - 1.2 mg/dL   GFR calc non Af Amer 36 (L) >60 mL/min   GFR calc Af Amer 42 (L) >60 mL/min    Comment: (NOTE) The eGFR has been calculated using the CKD EPI equation. This calculation has not been validated in all clinical situations. eGFR's persistently <60 mL/min signify possible Chronic Kidney Disease.    Anion gap  10 5 - 15  Lipase, blood     Status: None   Collection Time: 09/06/16  4:55 PM  Result Value Ref Range   Lipase 31 11 - 51 U/L  CBC with Diff     Status: Abnormal   Collection Time: 09/06/16  4:55 PM  Result Value Ref Range   WBC 9.0 4.0 - 10.5 K/uL   RBC 3.92 3.87 - 5.11 MIL/uL   Hemoglobin 11.8 (L) 12.0 - 15.0 g/dL   HCT 35.8 (L) 36.0 - 46.0 %   MCV 91.3 78.0 - 100.0 fL   MCH 30.1 26.0 - 34.0 pg   MCHC 33.0 30.0 - 36.0 g/dL   RDW 14.6 11.5 - 15.5 %   Platelets 330 150 - 400 K/uL   Neutrophils Relative % 65 %   Neutro Abs 5.8 1.7 - 7.7 K/uL   Lymphocytes Relative 23 %   Lymphs Abs 2.1 0.7 - 4.0 K/uL   Monocytes Relative 10 %   Monocytes Absolute 0.9 0.1 - 1.0 K/uL   Eosinophils Relative 2 %   Eosinophils Absolute 0.2 0.0 - 0.7 K/uL   Basophils Relative 0 %   Basophils Absolute 0.0 0.0 - 0.1 K/uL  Urinalysis, Routine w reflex microscopic     Status: Abnormal   Collection Time: 09/06/16  4:55 PM  Result Value Ref Range   Color, Urine YELLOW YELLOW   APPearance CLEAR CLEAR   Specific Gravity, Urine <1.005 (L) 1.005 - 1.030   pH 7.0 5.0 - 8.0   Glucose, UA NEGATIVE NEGATIVE mg/dL   Hgb urine dipstick TRACE (A) NEGATIVE   Bilirubin Urine NEGATIVE NEGATIVE   Ketones, ur NEGATIVE NEGATIVE mg/dL   Protein, ur NEGATIVE NEGATIVE mg/dL   Nitrite NEGATIVE NEGATIVE   Leukocytes, UA TRACE (A) NEGATIVE  Urinalysis, Microscopic (reflex)  Status: Abnormal   Collection Time: 09/06/16  4:55 PM  Result Value Ref Range   RBC / HPF NONE SEEN 0 - 5 RBC/hpf   WBC, UA 0-5 0 - 5 WBC/hpf   Bacteria, UA RARE (A) NONE SEEN   Squamous Epithelial / LPF 0-5 (A) NONE SEEN  Urine culture     Status: Abnormal   Collection Time: 09/06/16  4:55 PM  Result Value Ref Range   Specimen Description URINE, RANDOM    Special Requests NONE    Culture (A)     <10,000 COLONIES/mL INSIGNIFICANT GROWTH Performed at [facility], [place] [address], [place], [place] [address]    Report Status 09/08/2016  FINAL   Basic metabolic panel     Status: Abnormal   Collection Time: 10/09/16 11:40 AM  Result Value Ref Range   Sodium 135 135 - 145 mmol/L   Potassium 4.2 3.5 - 5.1 mmol/L   Chloride 103 101 - 111 mmol/L   CO2 25 22 - 32 mmol/L   Glucose, Bld 98 65 - 99 mg/dL   BUN 30 (H) 6 - 20 mg/dL   Creatinine, Ser 1.64 (H) 0.44 - 1.00 mg/dL   Calcium 9.0 8.9 - 10.3 mg/dL   GFR calc non Af Amer 28 (L) >60 mL/min   GFR calc Af Amer 33 (L) >60 mL/min    Comment: (NOTE) The eGFR has been calculated using the CKD EPI equation. This calculation has not been validated in all clinical situations. eGFR's persistently <60 mL/min signify possible Chronic Kidney Disease.    Anion gap 7 5 - 15  CBC     Status: Abnormal   Collection Time: 10/09/16 11:40 AM  Result Value Ref Range   WBC 10.7 (H) 4.0 - 10.5 K/uL   RBC 3.93 3.87 - 5.11 MIL/uL   Hemoglobin 11.9 (L) 12.0 - 15.0 g/dL   HCT 36.3 36.0 - 46.0 %   MCV 92.4 78.0 - 100.0 fL   MCH 30.3 26.0 - 34.0 pg   MCHC 32.8 30.0 - 36.0 g/dL   RDW 14.4 11.5 - 15.5 %   Platelets 331 150 - 400 K/uL  Protime-INR     Status: None   Collection Time: 10/09/16 11:40 AM  Result Value Ref Range   Prothrombin Time 13.9 11.4 - 15.2 seconds   INR 1.08   Hepatic function panel     Status: None   Collection Time: 10/09/16 11:40 AM  Result Value Ref Range   Total Protein 7.6 6.5 - 8.1 g/dL   Albumin 3.9 3.5 - 5.0 g/dL   AST 38 15 - 41 U/L   ALT 21 14 - 54 U/L   Alkaline Phosphatase 81 38 - 126 U/L   Total Bilirubin 0.6 0.3 - 1.2 mg/dL   Bilirubin, Direct 0.1 0.1 - 0.5 mg/dL   Indirect Bilirubin 0.5 0.3 - 0.9 mg/dL  Lipase, blood     Status: None   Collection Time: 10/09/16 11:40 AM  Result Value Ref Range   Lipase 26 11 - 51 U/L  CBG monitoring, ED     Status: None   Collection Time: 10/09/16 11:42 AM  Result Value Ref Range   Glucose-Capillary 94 65 - 99 mg/dL  TSH     Status: None   Collection Time: 10/09/16 12:55 PM  Result Value Ref Range   TSH  1.486 0.350 - 4.500 uIU/mL    Comment: Performed by a 3rd Generation assay with a functional sensitivity of <=0.01 uIU/mL.  Performed at [facility], [place] [address] [place], [place] [address]   Basic metabolic panel     Status: Abnormal   Collection Time: 10/10/16  9:51 AM  Result Value Ref Range   Sodium 139 135 - 145 mEq/L   Potassium 4.9 3.5 - 5.1 mEq/L   Chloride 102 96 - 112 mEq/L   CO2 29 19 - 32 mEq/L   Glucose, Bld 88 70 - 99 mg/dL   BUN 27 (H) 6 - 23 mg/dL   Creatinine, Ser 1.53 (H) 0.40 - 1.20 mg/dL   Calcium 9.6 8.4 - 10.5 mg/dL   GFR 34.54 (L) >60.00 mL/min  Basic metabolic panel     Status: Abnormal   Collection Time: 10/20/16  2:48 PM  Result Value Ref Range   Sodium 138 135 - 145 mmol/L   Potassium 3.9 3.5 - 5.1 mmol/L   Chloride 105 101 - 111 mmol/L   CO2 26 22 - 32 mmol/L   Glucose, Bld 106 (H) 65 - 99 mg/dL   BUN 31 (H) 6 - 20 mg/dL   Creatinine, Ser 1.65 (H) 0.44 - 1.00 mg/dL   Calcium 8.6 (L) 8.9 - 10.3 mg/dL   GFR calc non Af Amer 28 (L) >60 mL/min   GFR calc Af Amer 32 (L) >60 mL/min    Comment: (NOTE) The eGFR has been calculated using the CKD EPI equation. This calculation has not been validated in all clinical situations. eGFR's persistently <60 mL/min signify possible Chronic Kidney Disease.    Anion gap 7 5 - 15  CBC     Status: Abnormal   Collection Time: 10/20/16  2:48 PM  Result Value Ref Range   WBC 9.1 4.0 - 10.5 K/uL   RBC 3.73 (L) 3.87 - 5.11 MIL/uL   Hemoglobin 11.2 (L) 12.0 - 15.0 g/dL   HCT 34.8 (L) 36.0 - 46.0 %   MCV 93.3 78.0 - 100.0 fL   MCH 30.0 26.0 - 34.0 pg   MCHC 32.2 30.0 - 36.0 g/dL   RDW 14.4 11.5 - 15.5 %   Platelets 316 150 - 400 K/uL  Urinalysis, Routine w reflex microscopic     Status: Abnormal   Collection Time: 10/20/16  2:59 PM  Result Value Ref Range   Color, Urine YELLOW YELLOW   APPearance CLEAR CLEAR   Specific Gravity, Urine 1.010 1.005 - 1.030   pH 7.0 5.0 - 8.0   Glucose, UA NEGATIVE NEGATIVE  mg/dL   Hgb urine dipstick NEGATIVE NEGATIVE   Bilirubin Urine NEGATIVE NEGATIVE   Ketones, ur NEGATIVE NEGATIVE mg/dL   Protein, ur NEGATIVE NEGATIVE mg/dL   Nitrite NEGATIVE NEGATIVE   Leukocytes, UA TRACE (A) NEGATIVE  Urinalysis, Microscopic (reflex)     Status: Abnormal   Collection Time: 10/20/16  2:59 PM  Result Value Ref Range   RBC / HPF 0-5 0 - 5 RBC/hpf   WBC, UA 0-5 0 - 5 WBC/hpf   Bacteria, UA MANY (A) NONE SEEN   Squamous Epithelial / LPF 0-5 (A) NONE SEEN   Hyaline Casts, UA PRESENT   CBG monitoring, ED     Status: None   Collection Time: 10/20/16  3:18 PM  Result Value Ref Range   Glucose-Capillary 92 65 - 99 mg/dL   Assessment/Plan: 1. Renal insufficiency Slight elevation in creatinine on labs in ER. BUN also elevated -- needs increased hydration. She is to work on this. Repeat BMP 1 week. If not improving, needs Nephrology assessment.  -  Basic metabolic panel; Future  2. Postural dizziness with presyncope Secondary to tizanidine. She has completely stopped this medication. No recurrence of symptoms. She will discuss alternative medications for her spasms with her Neurologist.   3. Acute bacterial sinusitis Rx Doxycycline.  Increase fluids.  Rest.  Saline nasal spray.  Probiotic.  Mucinex as directed.  Humidifier in bedroom. Continue allergy medications.  Call or return to clinic if symptoms are not improving.    Leeanne Rio, PA-C

## 2016-10-22 NOTE — Patient Instructions (Signed)
No more tizanidine. Increase fluids as you are not hydrating well-enough. This is affecting kidney enzymes. Want to recheck kidney function in 1 week. If not improved, would want a kidney doctor to take a look.   Please follow-up with Neurology next week as scheduled.   Please take antibiotic as directed.  Increase fluid intake.  Use Saline nasal spray.  Take a daily multivitamin. Continue allergy medication.  Place a humidifier in the bedroom.  Please call or return clinic if symptoms are not improving.  Sinusitis Sinusitis is redness, soreness, and swelling (inflammation) of the paranasal sinuses. Paranasal sinuses are air pockets within the bones of your face (beneath the eyes, the middle of the forehead, or above the eyes). In healthy paranasal sinuses, mucus is able to drain out, and air is able to circulate through them by way of your nose. However, when your paranasal sinuses are inflamed, mucus and air can become trapped. This can allow bacteria and other germs to grow and cause infection. Sinusitis can develop quickly and last only a short time (acute) or continue over a long period (chronic). Sinusitis that lasts for more than 12 weeks is considered chronic.  CAUSES  Causes of sinusitis include:  Allergies.  Structural abnormalities, such as displacement of the cartilage that separates your nostrils (deviated septum), which can decrease the air flow through your nose and sinuses and affect sinus drainage.  Functional abnormalities, such as when the small hairs (cilia) that line your sinuses and help remove mucus do not work properly or are not present. SYMPTOMS  Symptoms of acute and chronic sinusitis are the same. The primary symptoms are pain and pressure around the affected sinuses. Other symptoms include:  Upper toothache.  Earache.  Headache.  Bad breath.  Decreased sense of smell and taste.  A cough, which worsens when you are lying flat.  Fatigue.  Fever.  Thick  drainage from your nose, which often is green and may contain pus (purulent).  Swelling and warmth over the affected sinuses. DIAGNOSIS  Your caregiver will perform a physical exam. During the exam, your caregiver may:  Look in your nose for signs of abnormal growths in your nostrils (nasal polyps).  Tap over the affected sinus to check for signs of infection.  View the inside of your sinuses (endoscopy) with a special imaging device with a light attached (endoscope), which is inserted into your sinuses. If your caregiver suspects that you have chronic sinusitis, one or more of the following tests may be recommended:  Allergy tests.  Nasal culture A sample of mucus is taken from your nose and sent to a lab and screened for bacteria.  Nasal cytology A sample of mucus is taken from your nose and examined by your caregiver to determine if your sinusitis is related to an allergy. TREATMENT  Most cases of acute sinusitis are related to a viral infection and will resolve on their own within 10 days. Sometimes medicines are prescribed to help relieve symptoms (pain medicine, decongestants, nasal steroid sprays, or saline sprays).  However, for sinusitis related to a bacterial infection, your caregiver will prescribe antibiotic medicines. These are medicines that will help kill the bacteria causing the infection.  Rarely, sinusitis is caused by a fungal infection. In theses cases, your caregiver will prescribe antifungal medicine. For some cases of chronic sinusitis, surgery is needed. Generally, these are cases in which sinusitis recurs more than 3 times per year, despite other treatments. HOME CARE INSTRUCTIONS   Drink plenty of  water. Water helps thin the mucus so your sinuses can drain more easily.  Use a humidifier.  Inhale steam 3 to 4 times a day (for example, sit in the bathroom with the shower running).  Apply a warm, moist washcloth to your face 3 to 4 times a day, or as directed by  your caregiver.  Use saline nasal sprays to help moisten and clean your sinuses.  Take over-the-counter or prescription medicines for pain, discomfort, or fever only as directed by your caregiver. SEEK IMMEDIATE MEDICAL CARE IF:  You have increasing pain or severe headaches.  You have nausea, vomiting, or drowsiness.  You have swelling around your face.  You have vision problems.  You have a stiff neck.  You have difficulty breathing. MAKE SURE YOU:   Understand these instructions.  Will watch your condition.  Will get help right away if you are not doing well or get worse. Document Released: 12/24/2004 Document Revised: 03/18/2011 Document Reviewed: 01/08/2011 [facility] Patient Information 2014 [place], [place].

## 2016-10-22 NOTE — Progress Notes (Signed)
Pre visit review using our clinic review tool, if applicable. No additional management support is needed unless otherwise documented below in the visit note. 

## 2016-10-26 NOTE — Progress Notes (Signed)
Office Visit Note  Patient: Kristen Manning             Date of Birth: [birth year]           MRN: [id number]             PCP: Midge Minium, MD Referring: Midge Minium, MD Visit Date: 10/30/2016 Occupation: '@GUAROCC'$ @    Subjective:  Feet pain.   History of Present Illness: Kristen Manning is a 83 y.o. female with gout and osteoarthritis. She states she might have had 2 flares of gout in her feet. She states the last flare was about 2 weeks ago. She's been also having increased discomfort in her bilateral feet. She was seen by her podiatrist and then was referred to neurologist. She states she had EMG nerve conduction test and was diagnosed with peripheral neuropathy. She was placed on gabapentin. Patient states that she was also prescribed tizanidine. She had a passing out episode after tizanidine does and she discontinued the medication. She states she had to go to the emergency room after the passing out episode and had a CT scan of her head.  Activities of Daily Living:  Patient reports morning stiffness for 0 minute.   Patient Denies nocturnal pain.  Difficulty dressing/grooming: Denies Difficulty climbing stairs: Reports Difficulty getting out of chair: Reports Difficulty using hands for taps, buttons, cutlery, and/or writing: Denies   Review of Systems  Constitutional: Positive for fatigue. Negative for night sweats, weight gain, weight loss and weakness.  HENT: Positive for mouth dryness. Negative for mouth sores, trouble swallowing, trouble swallowing and nose dryness.   Eyes: Positive for dryness. Negative for pain, redness and visual disturbance.  Respiratory: Negative for cough, shortness of breath and difficulty breathing.   Cardiovascular: Positive for swelling in legs/feet. Negative for chest pain, palpitations, hypertension and irregular heartbeat.  Gastrointestinal: Positive for constipation. Negative for blood in stool and diarrhea.  Endocrine:  Negative for increased urination.  Genitourinary: Negative for vaginal dryness.  Musculoskeletal: Positive for arthralgias, joint pain, joint swelling and muscle tenderness. Negative for myalgias, muscle weakness, morning stiffness and myalgias.  Skin: Positive for hair loss. Negative for color change, rash, skin tightness, ulcers and sensitivity to sunlight.  Allergic/Immunologic: Negative for susceptible to infections.  Neurological: Positive for numbness. Negative for dizziness, memory loss and night sweats.  Hematological: Negative for swollen glands.  Psychiatric/Behavioral: Positive for sleep disturbance. Negative for depressed mood. The patient is not nervous/anxious.     PMFS History:  Patient Active Problem List   Diagnosis Date Noted  . Muscle spasm 10/28/2016  . DDD (degenerative disc disease), lumbar 10/07/2016  . Primary osteoarthritis of both hands 10/07/2016  . Primary osteoarthritis of both feet 10/07/2016  . Tension-type headache, not intractable 09/16/2016  . GERD (gastroesophageal reflux disease) 09/11/2016  . ANA positive 09/02/2016  . Myalgia 06/26/2016  . Shortness of breath 03/22/2015  . History of total knee replacement, bilateral 03/20/2015  . Hypothyroidism 09/21/2014  . Cephalalgia 09/15/2014  . Gout 06/20/2014  . Skin infection 06/20/2014  . Lower leg edema 06/13/2014  . Pain in both feet 06/13/2014  . Chest wall pain 04/29/2014  . Meningioma ([place]) 10/07/2013  . Chronic tension-type headache, intractable 10/07/2013  . Pain of right lower leg 08/11/2013  . Lymphangitis, acute, lower leg 08/11/2013  . Neck pain 07/28/2013  . Swelling of limb 07/07/2013  . Headache(784.0) 07/07/2013  . Depression 06/23/2013  . Insomnia 05/26/2013  . Routine general medical examination at  a health care facility 10/17/2011  . HTN (hypertension) 07/03/2011  . Hyperlipidemia 07/03/2011  . Venous insufficiency, peripheral 07/03/2011    Past Medical History:  Diagnosis  Date  . Arthritis   . Bacterial infection    in lungs in Jan 2015  . Bilateral lower extremity edema   . Cancer (HCC)    skin cancer, back, legs melonama  . Chronic back pain   . GERD (gastroesophageal reflux disease)    takes Omeprazole daily  . Headache(784.0)   . History of migraine    last one about 15 yrs ago  . Hyperlipidemia    takes Zetia daily  . Hypertension    takes HCTZ daily  . Joint pain   . Joint swelling   . Myocardial infarction [facility])    pt states EKG always shows infarct but no change from previous ekg;never knew anything about it  . Nocturia   . Osteoarthritis   . Pneumonia    hx of-many yrs ago  . Pneumonia   . PONV (postoperative nausea and vomiting)   . Shortness of breath    with exertion  . Urinary frequency   . Urinary incontinence   . Urinary urgency     Family History  Problem Relation Age of Onset  . Kidney disease Mother   . Heart disease Mother   . Hypertension Mother   . Varicose Veins Mother   . Kidney failure Mother   . Alcohol abuse Father   . Stroke Father        several   . Hypertension Father   . Varicose Veins Father   . Alcohol abuse Brother   . Hyperlipidemia Brother   . Hypertension Brother   . Early death Brother   . Varicose Veins Brother   . Heart disease Brother   . Hypertension Brother   . Arthritis Brother        Gout  . Varicose Veins Son   . Deep vein thrombosis Son   . Breast cancer Maternal Aunt    Past Surgical History:  Procedure Laterality Date  . bladder tack  1998  . BLEPHAROPLASTY Bilateral   . JOINT REPLACEMENT Left 10/02/2009  . JOINT REPLACEMENT Right 02-22-13   Knee  . KNEE SURGERY Bilateral 2009 and 2011  . OOPHORECTOMY  1998   only one  . TONSILLECTOMY  as a child ? date  . TOTAL KNEE ARTHROPLASTY Right 02/22/2013   DR Sherlean Foot  . TOTAL KNEE ARTHROPLASTY Right 02/22/2013   Procedure: TOTAL KNEE ARTHROPLASTY;  Surgeon: Dannielle Huh, MD;  Location: [facility] OR;  Service: Orthopedics;  Laterality:  Right;   Social History   Social History Narrative  . No narrative on file     Objective: Vital Signs: BP 130/62 (BP Location: Left Arm, Patient Position: Sitting, Cuff Size: Normal)   Pulse 82   Ht 5\' 5"  (1.651 m)   Wt 219 lb (99.3 kg)   BMI 36.44 kg/m    Physical Exam  Constitutional: She is oriented to person, place, and time. She appears well-developed and well-nourished.  HENT:  Head: Normocephalic and atraumatic.  Eyes: Conjunctivae and EOM are normal.  Neck: Normal range of motion.  Cardiovascular: Normal rate, regular rhythm, normal heart sounds and intact distal pulses.   Bilateral pedal edema  Pulmonary/Chest: Effort normal and breath sounds normal.  Abdominal: Soft. Bowel sounds are normal.  Lymphadenopathy:    She has no cervical adenopathy.  Neurological: She is alert and oriented to person, place,  and time.  Skin: Skin is warm and dry. Capillary refill takes less than 2 seconds.  Psychiatric: She has a normal mood and affect. Her behavior is normal.  Nursing note and vitals reviewed.    Musculoskeletal Exam: C-spine limited range of motion. She has some thoracic kyphosis. Shoulder joints elbow joints wrist joint MCPs PIPs DIPs with good range of motion with no synovitis. Hip joints knee joints ankles MTPs PIPs DIPs with good range of motion with no synovitis.  CDAI Exam: No CDAI exam completed.    Investigation: No additional findings. 09/04/2016 UA negative, ESR 32, ANA negative, ENA negative, C3-C4 normal, beta-2 GP 1 negative, anticardiolipin IgM 70, lupus anticoagulant negative, RF negative, anti-CCP negative, uric acid 5.7  Imaging: Ct Head Wo Contrast  Result Date: 10/09/2016 CLINICAL DATA:  Altered mental status with headache and blurred vision EXAM: CT HEAD WITHOUT CONTRAST TECHNIQUE: Contiguous axial images were obtained from the base of the skull through the vertex without intravenous contrast. COMPARISON:  None. FINDINGS: Brain: There is age  related volume loss. There is no intracranial mass, hemorrhage, extra-axial fluid collection, or midline shift. There is small vessel disease in the centra semiovale bilaterally. No acute infarct evident. Elsewhere gray-white compartments appear normal. Vascular: No evident hyperdense vessel. There is calcification in each carotid siphon region. There is also mild calcification in each distal vertebral artery. Skull: There are small exostoses along the frontal bone. Bony calvarium appears intact. Sinuses/Orbits: There is mucosal thickening in several ethmoid air cells bilaterally. There is evidence of previous antrostomy on the right. Other visualized paranasal sinuses are clear. Visualized orbits appear symmetric bilaterally. Other: Mastoid air cells are clear. IMPRESSION: Age-related volume loss with patchy small vessel disease in the centra semiovale bilaterally. No intracranial mass, hemorrhage, or extra-axial fluid collection. No acute appearing infarct. There are foci of arterial vascular calcification. There is mild paranasal sinus disease. Electronically Signed   By: Lowella Grip III M.D.   On: 10/09/2016 12:32   Dexascan  Result Date: 10/16/2016 EXAM: DUAL X-RAY ABSORPTIOMETRY (DXA) FOR BONE MINERAL DENSITY IMPRESSION: Referring Physician:  Annye Asa PATIENT: Name: RAYLEIGH, GILLYARD Patient ID: [id number] Birth Date: 10-Oct-1932 Height: 63.5 in. Sex: Female Measured: 10/16/2016 Weight: 216.3 lbs. Indications: Caucasian, Estrogen Deficient, Gabapentin, Hypothyroid, Hysterectomy, Levothyroxine, One ovary removed, Postmenopausal Fractures: None Treatments: None ASSESSMENT: The BMD measured at AP Spine L1-L4 is 0.984 g/cm2 with a T-score of -1.7. This patient is considered osteopenic according to [facility]) criteria. There has been no statistically significant change in BMD of Lumbar spine, or of Left hip since prior exam dated 04/27/2014. Site Region Measured Date Measured  Age YA BMD Significant CHANGE T-score AP Spine  L1-L4      12/16/[age]         -1.7    0.984 g/cm2 DualFemur Neck Right 10/16/2016    [age]         -1.6    0.816 g/cm2 World Health Organization [facility]) criteria for post-menopausal, Caucasian Women: Normal       T-score at or above -1 SD Osteopenia   T-score between -1 and -2.5 SD Osteoporosis T-score at or below -2.5 SD RECOMMENDATION: [place] recommends that FDA-approved medical therapies be considered in postmenopausal women and men age 40 or older with a: 1. Hip or vertebral (clinical or morphometric) fracture. 2. T-score of <-2.5 at the spine or hip. 3. Ten-year fracture probability by FRAX of 3% or greater for hip fracture or  20% or greater for major osteoporotic fracture. All treatment decisions require clinical judgment and consideration of individual patient factors, including patient preferences, co-morbidities, previous drug use, risk factors not captured in the FRAX model (e.g. falls, vitamin D deficiency, increased bone turnover, interval significant decline in bone density) and possible under - or over-estimation of fracture risk by FRAX. All patients should ensure an adequate intake of dietary calcium (1200 mg/d) and vitamin D (800 IU daily) unless contraindicated. FOLLOW-UP: People with diagnosed cases of osteoporosis or at high risk for fracture should have regular bone mineral density tests. For patients eligible for Medicare, routine testing is allowed once every 2 years. The testing frequency can be increased to one year for patients who have rapidly progressing disease, those who are receiving or discontinuing medical therapy to restore bone mass, or have additional risk factors. I have reviewed this report, and agree with the above findings. [facility] FRAX* 10-year Probability of Fracture Based on femoral neck BMD: DualFemur (Right) Major Osteoporotic Fracture: 12.5% Hip Fracture:                3.2%  Population:                  [place] (Caucasian) Risk Factors:                None *FRAX is a Materials engineer of the [employer] for Metabolic Bone Disease, a World Pharmacologist (WHO) [facility]. ASSESSMENT: The probability of a major osteoporotic fracture is 12.5 % within the next ten years. The probability of a hip fracture is 3.2 % within the next ten years. Electronically Signed   By: Marijo Conception, M.D.   On: 10/16/2016 09:29    Speciality Comments: No specialty comments available.    Procedures:  No procedures performed Allergies: Aspirin; Codeine; and Tizanidine hcl   Assessment / Plan:     Visit Diagnoses: ANA positive - Repeat ANA negative, ENA negative, C3-C4 normal, anticardiolipin IgM positive nonspecific. She does have elevated cardiolipin antibody IgM patient would prefer not to take aspirin. She states she has side effects from aspirin.  Idiopathic chronic gout of multiple sites without tophus - On allopurinol 300 mg by mouth daily. Her uric acid isn't desirable range. She had recent gout flare. Need to monitor her dietary intake of red meat was discussed. Weight loss diet and exercise was also discussed.  Primary osteoarthritis of both hands: Joint protection and muscle strengthening was discussed.  History of total knee replacement, bilateral: Doing fairly well she still have some difficulty with mobility due to instability.  Neck pain: Some stiffness  DDD (degenerative disc disease), lumbar: Chronic pain  Primary osteoarthritis of both feet: She's been seen by podiatrist  History of peripheral neuropathy: She's been recently diagnosed with peripheral neuropathy by neurologist. She is taking Neurontin now. Essential hypertension  Pure hypercholesterolemia: Weight loss would be helpful.  Venous insufficiency, peripheral    Orders: Orders Placed This Encounter  Procedures  . CBC with Differential/Platelet  . COMPLETE  METABOLIC PANEL WITH GFR  . Uric acid   No orders of the defined types were placed in this encounter.   Face-to-face time spent with patient was 30 minutes. Greater than 50% of time was spent in counseling and coordination of care.  Follow-Up Instructions: Return in about 6 months (around 04/30/2017) for Gout, Osteoarthritis.   Bo Merino, MD  Note - This record has been created using Editor, commissioning.  Chart  creation errors have been sought, but may not always  have been located. Such creation errors do not reflect on  the standard of medical care.

## 2016-10-28 ENCOUNTER — Encounter: Payer: Self-pay | Admitting: Neurology

## 2016-10-28 ENCOUNTER — Other Ambulatory Visit: Payer: Medicaid Other

## 2016-10-28 ENCOUNTER — Ambulatory Visit (INDEPENDENT_AMBULATORY_CARE_PROVIDER_SITE_OTHER): Payer: Medicare Other | Admitting: Neurology

## 2016-10-28 VITALS — BP 130/68 | HR 73 | Ht 65.0 in | Wt 221.0 lb

## 2016-10-28 DIAGNOSIS — G629 Polyneuropathy, unspecified: Secondary | ICD-10-CM

## 2016-10-28 DIAGNOSIS — M62838 Other muscle spasm: Secondary | ICD-10-CM | POA: Insufficient documentation

## 2016-10-28 DIAGNOSIS — G44209 Tension-type headache, unspecified, not intractable: Secondary | ICD-10-CM

## 2016-10-28 NOTE — Progress Notes (Signed)
Rest   NEUROLOGY FOLLOW UP OFFICE NOTE  Kristen Manning [id number]  HISTORY OF PRESENT ILLNESS: I had the pleasure of seeing Kristen Manning in the neurology clinic on 10/28/2016.  The patient was last seen 6 weeks ago for tension-type headaches. She presents for an earlier urgent visit due to recent ER visits for altered mental status. On 10/09/16, she was brought to the ER for transient gait abnormality, blurred vision, mild headache, nausea, and somnolence. She did not recall prior conversations with her brother. She was back to baseline in the ER with normal exam and discharged home. She was back in the ER on 10/20/16 with similar symptoms, she was confused and somnolent, slumped over. She reports today that both times she had taken Tizanidine, which was recently prescribed for her muscle spasms. She was referred by her podiatrist to neurologist Dr. Trula Ore, she reported muscle spasms in her hands, leg, foot. She also reported numbness and tingling, burning in both feet, particularly the sides of her feet. She also had some numbness in both hands. She had been having pain on her flank region, she felt like something was pushing her stomach out, going into her spine. She reports having a nerve conduction study done and told she had "neuropathy everywhere." She was started on gabapentin, currently on 300mg  in AM, 600mg  in PM. She feels this has helped, the pain is not as bad.   HPI: This is a pleasant 83 yo RH woman with a history of hypertension, hyperlipidemia, melanoma, who presented with headaches that started after knee surgery last February 2015. She had a difficult time during the admission, with no recollection of her hospital stay. She asked for hospital records which reported walking several steps, which she does not remember. She recalls waking up in pain, and "waking up" when she was to be transferred to the nursing home. She did not have a good experience at the SNF, and feels  she had headaches at that time but was taking pain medications for her knee. She started to notice the daily headaches once she got home in March. She reports headaches are constant over the left temporal region, usually a 2 or 3/10, but increasing in severity up to a 5 or 6/10. Headaches would radiate over the periorbital, frontal, and occipital regions, described as a throbbing pain that would cause some dizziness and nausea when more severe. She has a more intense headache today. She has noticed blurred vision in both eyes. She takes Advil 2-3 times a week when pain is more severe, but it only helps calm the pain down. She denies having any headache-free days in the past 4 months. She reports that she had trouble sleeping once she got home, bothered by the memories of other patients at the [facility]. She was prescribed Zoloft by her PCP but did not take it due to listed side effects on the package. She reports that sleep is a little better now, she usually gets 6 hours of sleep but wakes up several times to urinate due to the HCTZ she takes at bedtime. She rarely takes her brother's Ativan to sleep (1-2/month per patient). She reports a history of migraines with her period when younger, but has been headache-free for 15 years until last March. She denies any family history of migraines.   Diagnostic Data: I personally reviewed her MRI brain with and without contrast with the patient, there is a moderate amount of chronic microvascular disease in the bilateral subcortical regions.  There is a small 1.8 x 1.1 x 1.4 cm meningioma in the left temporal region with minimal mass effect, no edema. There is note of a prominent left PCA infundibulum extending off the left ICA versus small aneurysm, incompletely evaluated on this exam.  MRI brain 09/13/14 unchanged.  PAST MEDICAL HISTORY: Past Medical History:  Diagnosis Date  . Arthritis   . Bacterial infection    in lungs in Jan 2015  . Bilateral lower extremity edema     . Cancer ([place])    skin cancer, back, legs melonama  . Chronic back pain   . GERD (gastroesophageal reflux disease)    takes Omeprazole daily  . Headache(784.0)   . History of migraine    last one about 15 yrs ago  . Hyperlipidemia    takes Zetia daily  . Hypertension    takes HCTZ daily  . Joint pain   . Joint swelling   . Myocardial infarction [facility])    pt states EKG always shows infarct but no change from previous ekg;never knew anything about it  . Nocturia   . Osteoarthritis   . Pneumonia    hx of-many yrs ago  . Pneumonia   . PONV (postoperative nausea and vomiting)   . Shortness of breath    with exertion  . Urinary frequency   . Urinary incontinence   . Urinary urgency     MEDICATIONS: Current Outpatient Prescriptions on File Prior to Visit  Medication Sig Dispense Refill  . acetaminophen (TYLENOL) 500 MG tablet Take 1 tablet (500 mg total) by mouth every 8 (eight) hours as needed for moderate pain. 90 tablet 1  . allopurinol (ZYLOPRIM) 300 MG tablet Take 300 mg by mouth daily.  0  . BEE POLLEN PO Take by mouth.    . Coenzyme Q10 (CO Q 10 PO) Take by mouth.    . doxycycline (VIBRAMYCIN) 100 MG capsule Take 1 capsule (100 mg total) by mouth 2 (two) times daily. 20 capsule 0  . ezetimibe (ZETIA) 10 MG tablet take 1 tablet by mouth once daily 30 tablet 5  . fenofibrate 160 MG tablet take 1 tablet by mouth once daily 30 tablet 6  . fluticasone (FLONASE) 50 MCG/ACT nasal spray Place 2 sprays into both nostrils daily. 16 g 6  . furosemide (LASIX) 40 MG tablet Take 40 mg by mouth daily.   0  . gabapentin (NEURONTIN) 300 MG capsule take 1 capsule by mouth every evening for 7 days then take 1 capsule bid for 7 days then 1 capsule tid  0  . ibuprofen (ADVIL,MOTRIN) 200 MG tablet Take 200 mg by mouth every 6 (six) hours as needed.    [name] levothyroxine (SYNTHROID, LEVOTHROID) 50 MCG tablet take 1 tablet by mouth once daily 30 tablet 6  . loratadine (CLARITIN) 10 MG tablet Take  10 mg by mouth daily.    . naproxen sodium (ANAPROX) 220 MG tablet Take 220 mg by mouth 2 (two) times daily with a meal.    . pantoprazole (PROTONIX) 40 MG tablet Take 1 tablet (40 mg total) by mouth daily. 30 tablet 3   No current facility-administered medications on file prior to visit.     ALLERGIES: Allergies  Allergen Reactions  . Aspirin Other (See Comments)    Noted caused 2 holes in retina, not sure   . Codeine Hives  . Tizanidine Hcl Other (See Comments)    Confusion    FAMILY HISTORY: Family History  Problem Relation Age  of Onset  . Kidney disease Mother   . Heart disease Mother   . Hypertension Mother   . Varicose Veins Mother   . Kidney failure Mother   . Alcohol abuse Father   . Stroke Father        several   . Hypertension Father   . Varicose Veins Father   . Alcohol abuse Brother   . Hyperlipidemia Brother   . Hypertension Brother   . Early death Brother   . Varicose Veins Brother   . Heart disease Brother   . Hypertension Brother   . Arthritis Brother        Gout  . Varicose Veins Son   . Deep vein thrombosis Son   . Breast cancer Maternal Aunt     SOCIAL HISTORY: Social History   Social History  . Marital status: Widowed    Spouse name: N/A  . Number of children: N/A  . Years of education: N/A   Occupational History  . Not on file.   Social History Main Topics  . Smoking status: Never Smoker  . Smokeless tobacco: Never Used  . Alcohol use No  . Drug use: No  . Sexual activity: Not on file   Other Topics Concern  . Not on file   Social History Narrative  . No narrative on file    REVIEW OF SYSTEMS: Constitutional: No fevers, chills, or sweats, no generalized fatigue, change in appetite Eyes: No visual changes, double vision, eye pain Ear, nose and throat: No hearing loss, ear pain, nasal congestion, sore throat Cardiovascular: No chest pain, palpitations Respiratory:  No shortness of breath at rest or with exertion,  wheezes GastrointestinaI: No nausea, vomiting, diarrhea, abdominal pain, fecal incontinence Genitourinary:  No dysuria, urinary retention or frequency Musculoskeletal:  + neck pain, back pain Integumentary: No rash, pruritus, skin lesions Neurological: as above Psychiatric: + depression, insomnia, anxiety Endocrine: No palpitations, fatigue, diaphoresis, mood swings, change in appetite, change in weight, increased thirst Hematologic/Lymphatic:  No anemia, purpura, petechiae. Allergic/Immunologic: no itchy/runny eyes,+ nasal congestion,no recent allergic reactions, rashes  PHYSICAL EXAM: Vitals:   10/28/16 1556  BP: 130/68  Pulse: 73  SpO2: 97%   General: No acute distress Head:  Normocephalic/atraumatic Neck: supple, no paraspinal tenderness, full range of motion Heart:  Regular rate and rhythm Lungs:  Clear to auscultation bilaterally Back: No paraspinal tenderness Skin/Extremities: No rash, no edema Neurological Exam: alert and oriented to person, place, and time. No aphasia or dysarthria. Fund of knowledge is appropriate.  Recent and remote memory are intact.  Attention and concentration are normal.    Able to name objects and repeat phrases. Cranial nerves: Pupils equal, round, reactive to light.Extraocular movements intact with no nystagmus. Visual fields full. Facial sensation intact. No facial asymmetry. Tongue, uvula, palate midline.  Motor: Bulk and tone normal, muscle strength 5/5 throughout with no pronator drift.  Sensation to light touch intact.  No extinction to double simultaneous stimulation.  Deep tendon reflexes +1 throughout, toes downgoing.  Finger to nose testing intact.  Gait slow and cautious. Romberg negative.  IMPRESSION: This is a pleasant 83 yo RH woman with a history of hypertension, hyperlipidemia, melanoma, who presented with new onset daily headaches in March 2015. MRI brain had shown an incidental finding of a small meningioma over the left temporal region,  stable in size with repeat imaging in 2016. The headaches she initially presented with have resolved and have not recurred. She presents for an earlier visit today  after 2 recent ER visits for confusion, somnolence. These appear to have been related to intake of Tizanidine both times. She has stopped medication. She continues to report muscle spasms, check ferritin, magnesium, calcium. She was advised to do stretching exercises. She will discuss very low dose Flexeril with her PCP. Continue gabapentin 300mg  in AM, 600mg  in PM. She reports being told about injections in her back, I discussed with her that our office does not do epidural injections, she is not interested in them and would like to continue with gabapentin for symptomatic treatment of neuropathic pain. She will Manning in 4-5 months and knows to call for any changes.   Thank you for allowing me to participate in her care.  Please do not hesitate to call for any questions or concerns.  The duration of this appointment visit was 25 minutes of face-to-face time with the patient.  Greater than 50% of this time was spent in counseling, explanation of diagnosis, planning of further management, and coordination of care.   Ellouise Newer, M.D.   CC: Dr. Birdie Riddle

## 2016-10-28 NOTE — Patient Instructions (Addendum)
1. Bloodwork for ferritin, magnesium, calcium 2. Continue gabapentin 300mg : take 1 cap in AM, 2 caps in PM 3. Discuss low dose of Flexeril with your PCP 4. Follow-up in 4-5 months, call for any changes

## 2016-10-29 ENCOUNTER — Telehealth: Payer: Self-pay | Admitting: General Practice

## 2016-10-29 ENCOUNTER — Encounter: Payer: Self-pay | Admitting: Family Medicine

## 2016-10-29 ENCOUNTER — Ambulatory Visit (INDEPENDENT_AMBULATORY_CARE_PROVIDER_SITE_OTHER): Payer: Medicare Other | Admitting: Family Medicine

## 2016-10-29 VITALS — BP 121/62 | HR 74 | Temp 98.0°F | Resp 16 | Ht 66.0 in | Wt 218.5 lb

## 2016-10-29 DIAGNOSIS — R7989 Other specified abnormal findings of blood chemistry: Secondary | ICD-10-CM | POA: Diagnosis not present

## 2016-10-29 DIAGNOSIS — N289 Disorder of kidney and ureter, unspecified: Secondary | ICD-10-CM | POA: Diagnosis not present

## 2016-10-29 DIAGNOSIS — J329 Chronic sinusitis, unspecified: Secondary | ICD-10-CM

## 2016-10-29 LAB — BASIC METABOLIC PANEL
BUN: 42 mg/dL — ABNORMAL HIGH (ref 6–23)
CO2: 30 mEq/L (ref 19–32)
Calcium: 9.5 mg/dL (ref 8.4–10.5)
Chloride: 103 mEq/L (ref 96–112)
Creatinine, Ser: 1.56 mg/dL — ABNORMAL HIGH (ref 0.40–1.20)
GFR: 33.77 mL/min — ABNORMAL LOW (ref >60.00)
Glucose, Bld: 89 mg/dL (ref 70–99)
Potassium: 4.5 mEq/L (ref 3.5–5.1)
Sodium: 141 mEq/L (ref 135–145)

## 2016-10-29 LAB — FERRITIN: Ferritin: 36 ng/mL (ref 20–288)

## 2016-10-29 LAB — MAGNESIUM: Magnesium: 2 mg/dL (ref 1.5–2.5)

## 2016-10-29 LAB — CALCIUM: Calcium: 9.5 mg/dL (ref 8.6–10.4)

## 2016-10-29 MED ORDER — CYCLOBENZAPRINE HCL 5 MG PO TABS: 5.0000 mg | ORAL_TABLET | Freq: Two times a day (BID) | ORAL | 0 refills | Status: DC | PRN

## 2016-10-29 NOTE — Addendum Note (Signed)
Addended by: Katina Dung on: 10/29/2016 09:25 AM   Modules accepted: Orders

## 2016-10-29 NOTE — Progress Notes (Signed)
   Subjective:    Patient ID: Kristen Manning, female    DOB: 1932/09/09, 83 y.o.   MRN: [id number]  HPI Elevated Cr- noted during recent ER visit.  She had f/u w/ Elyn Aquas on 10/16.  Pt reports good water intake.  Pt is on NSAIDs for pain and Doxy for sinusitis.  Denies swelling of hands/feet  Sinusitis- improving w/ course of Doxycycline and Flonase.  No longer having facial pain/pressure.   Review of Systems For ROS see HPI     Objective:   Physical Exam  Constitutional: She is oriented to person, place, and time. She appears well-developed and well-nourished. No distress.  obese  HENT:  Head: Normocephalic and atraumatic.  Eyes: Pupils are equal, round, and reactive to light. Conjunctivae and EOM are normal.  Neck: Normal range of motion. Neck supple. No thyromegaly present.  Cardiovascular: Normal rate, regular rhythm, normal heart sounds and intact distal pulses.   No murmur heard. Pulmonary/Chest: Effort normal and breath sounds normal. No respiratory distress.  Abdominal: Soft. She exhibits no distension. There is no tenderness.  Musculoskeletal: She exhibits no edema.  Lymphadenopathy:    She has no cervical adenopathy.  Neurological: She is alert and oriented to person, place, and time.  Skin: Skin is warm and dry.  Psychiatric: She has a normal mood and affect. Her behavior is normal.  Vitals reviewed.         Assessment & Plan:  1) elevated Cr- due for recheck today.  Pt reports she is drinking more fluids but she is also on NSAIDs prn.  Repeat labs and adjust medications prn.  2) Sinusitis- improving w/ the Doxy and Flonase.  Encouraged her to continue allergy meds to decrease sinus congestion and swelling.  Pt expressed understanding and is in agreement w/ plan.

## 2016-10-29 NOTE — Telephone Encounter (Signed)
Medication has been sent to pharmacy, will watch for PA to come through and will complete if necessary.

## 2016-10-29 NOTE — Telephone Encounter (Signed)
Ok for Flexeril 5mg  BID prn, #60.  This will likely need a prior auth- pt was unable to tolerate Tizanidine (caused syncope)

## 2016-10-29 NOTE — Telephone Encounter (Signed)
Pt made aware that Rx was sent to pharmacy

## 2016-10-29 NOTE — Patient Instructions (Signed)
Follow up as scheduled in January We'll notify you of your lab results and make any changes if needed Continue to drink plenty of fluids Exercise as you are able and make healthy food choices Call with any questions or concerns Hang in there!!!

## 2016-10-29 NOTE — Telephone Encounter (Signed)
Pt asked as she was leaving the office she asked if you could send in flexeril. I advised patient that flexeril was not the approved medication in the elderly. However pt cannot take tizanidine as it caused her symptoms of syncope. Please advise?

## 2016-10-30 ENCOUNTER — Ambulatory Visit (INDEPENDENT_AMBULATORY_CARE_PROVIDER_SITE_OTHER): Payer: Medicare Other | Admitting: Rheumatology

## 2016-10-30 ENCOUNTER — Encounter: Payer: Self-pay | Admitting: Rheumatology

## 2016-10-30 VITALS — BP 130/62 | HR 82 | Ht 65.0 in | Wt 219.0 lb

## 2016-10-30 DIAGNOSIS — E7800 Pure hypercholesterolemia, unspecified: Secondary | ICD-10-CM

## 2016-10-30 DIAGNOSIS — M5136 Other intervertebral disc degeneration, lumbar region: Secondary | ICD-10-CM

## 2016-10-30 DIAGNOSIS — M19041 Primary osteoarthritis, right hand: Secondary | ICD-10-CM

## 2016-10-30 DIAGNOSIS — R768 Other specified abnormal immunological findings in serum: Secondary | ICD-10-CM

## 2016-10-30 DIAGNOSIS — M19072 Primary osteoarthritis, left ankle and foot: Secondary | ICD-10-CM

## 2016-10-30 DIAGNOSIS — I10 Essential (primary) hypertension: Secondary | ICD-10-CM | POA: Diagnosis not present

## 2016-10-30 DIAGNOSIS — I872 Venous insufficiency (chronic) (peripheral): Secondary | ICD-10-CM

## 2016-10-30 DIAGNOSIS — M1A09X0 Idiopathic chronic gout, multiple sites, without tophus (tophi): Secondary | ICD-10-CM

## 2016-10-30 DIAGNOSIS — M19071 Primary osteoarthritis, right ankle and foot: Secondary | ICD-10-CM

## 2016-10-30 DIAGNOSIS — Z96653 Presence of artificial knee joint, bilateral: Secondary | ICD-10-CM

## 2016-10-30 DIAGNOSIS — Z8669 Personal history of other diseases of the nervous system and sense organs: Secondary | ICD-10-CM | POA: Diagnosis not present

## 2016-10-30 DIAGNOSIS — Z5181 Encounter for therapeutic drug level monitoring: Secondary | ICD-10-CM

## 2016-10-30 DIAGNOSIS — M19042 Primary osteoarthritis, left hand: Secondary | ICD-10-CM

## 2016-10-30 DIAGNOSIS — M542 Cervicalgia: Secondary | ICD-10-CM | POA: Diagnosis not present

## 2016-10-30 DIAGNOSIS — M51369 Other intervertebral disc degeneration, lumbar region without mention of lumbar back pain or lower extremity pain: Secondary | ICD-10-CM

## 2016-10-30 NOTE — Patient Instructions (Addendum)
Your next blood work is due in April  which will include CBC, CMP, uric acid.

## 2016-11-01 ENCOUNTER — Telehealth: Payer: Self-pay

## 2016-11-01 NOTE — Telephone Encounter (Signed)
Spoke with pt relaying message below.   

## 2016-11-01 NOTE — Telephone Encounter (Signed)
-----   Message from Cameron Sprang, MD sent at 10/29/2016 11:54 AM EDT ----- Pls let her know bloodwork is normal. Try the tonic water and stretching exercises. Thanks!

## 2016-11-05 ENCOUNTER — Other Ambulatory Visit: Payer: Self-pay | Admitting: Physician Assistant

## 2016-11-05 DIAGNOSIS — N289 Disorder of kidney and ureter, unspecified: Secondary | ICD-10-CM

## 2016-11-08 ENCOUNTER — Other Ambulatory Visit (INDEPENDENT_AMBULATORY_CARE_PROVIDER_SITE_OTHER): Payer: Medicare Other

## 2016-11-08 DIAGNOSIS — N289 Disorder of kidney and ureter, unspecified: Secondary | ICD-10-CM

## 2016-11-08 DIAGNOSIS — Z23 Encounter for immunization: Secondary | ICD-10-CM | POA: Diagnosis not present

## 2016-11-08 LAB — BASIC METABOLIC PANEL
BUN: 32 mg/dL — ABNORMAL HIGH (ref 6–23)
CO2: 28 mEq/L (ref 19–32)
Calcium: 9.2 mg/dL (ref 8.4–10.5)
Chloride: 102 mEq/L (ref 96–112)
Creatinine, Ser: 1.51 mg/dL — ABNORMAL HIGH (ref 0.40–1.20)
GFR: 35.06 mL/min — ABNORMAL LOW (ref >60.00)
Glucose, Bld: 79 mg/dL (ref 70–99)
Potassium: 4.8 mEq/L (ref 3.5–5.1)
Sodium: 137 mEq/L (ref 135–145)

## 2016-11-15 ENCOUNTER — Other Ambulatory Visit: Payer: Self-pay | Admitting: General Practice

## 2016-11-15 MED ORDER — EZETIMIBE 10 MG PO TABS: 10.0000 mg | ORAL_TABLET | Freq: Every day | ORAL | 5 refills | Status: DC

## 2016-12-04 ENCOUNTER — Other Ambulatory Visit: Payer: Self-pay | Admitting: General Practice

## 2016-12-04 NOTE — Telephone Encounter (Signed)
Please verify that pt has been taking this daily.  If yes, she can have a refill #30, 6 refills.  If she has not been taking, we will need to re-assess at upcoming visit

## 2016-12-04 NOTE — Telephone Encounter (Signed)
Please advise, per our records:  Last OV 10/29/16 Levothyroxine last filled 11/13/15 #30 with 6  Should pt be on levo still? Last TSH at hospital in October was 1.486, before that TSH was 3.29 on 07/30/16

## 2016-12-05 ENCOUNTER — Ambulatory Visit: Payer: Self-pay

## 2016-12-05 ENCOUNTER — Telehealth: Payer: Self-pay | Admitting: Family Medicine

## 2016-12-05 NOTE — Telephone Encounter (Signed)
OK for PEC to verify mediation and dosage with patient and refill medication per PCP

## 2016-12-05 NOTE — Telephone Encounter (Signed)
LMOVM for cell [phone]. Patient is in [place] with her son. Needing to verify if patient has been taking her Levothyroxine daily. Pharmacy requesting refill of medication

## 2016-12-05 NOTE — Telephone Encounter (Signed)
Phone call from pt.  Reported she is in [place],  [place]. With her son, and cam down with a cold on Monday.  C/o of scratchy throat and hoarse voice, nasal congestion, intermittent cough with grayish phlegm, headache, and muscle aches. c/o feeling tired. Requested an appt. with her PCP, when she returns next week on Wednesday, 12/5.  Appt. given for 12/5.  Advised if her symptoms worsen, she should go to UC in [place]. Pt. Verb. Understanding.     Reason for Disposition . [1] Sinus congestion (pressure, fullness) AND [2] present > 10 days  Answer Assessment - Initial Assessment Questions 1. ONSET: "When did the nasal discharge start?"      Monday 2. AMOUNT: "How much discharge is there?"      Blowing nose a lot;clear mucus with light blood tinge 3. COUGH: "Do you have a cough?" If yes, ask: "Describe the color of your sputum" (clear, white, yellow, green)     Cough with grayish phlegm, intermittent 4. RESPIRATORY DISTRESS: "Describe your breathing."      "I'm always short of breath." 5. FEVER: "Do you have a fever?" If so, ask: "What is your temperature, how was it measured, and when did it start?"     No fever; some muscle aches 6. SEVERITY: "Overall, how bad are you feeling right now?" (e.g., doesn't interfere with normal activities, staying home from school/work, staying in bed)      Feels tired; not sleeping well due to the cough  7. OTHER SYMPTOMS: "Do you have any other symptoms?" (e.g., sore throat, earache, wheezing, vomiting)     Headache, sore throat, intermittent wheezing 8. PREGNANCY: "Is there any chance you are pregnant?" "When was your last menstrual period?"     no  Protocols used: COMMON COLD-A-AH

## 2016-12-05 NOTE — Telephone Encounter (Signed)
Pt. Returned call.  Reported she has been taking her thyroid medication daily.  Stated there had been automatic refills from [facility] on her medication, and she got a "back log" of medicine.  Stated when she gets a refill, she pours the new tablets in the current bottle.  Reported her bottle on hand was dated 06/07/16, but she thinks it has been refilled since then.  I advised her that according to the medication chart, her last prescription was written on 11/13/15.  The pt. stated she will need the prescription to be rewritten, because she will be out of refills after the current bottle she is using.   Will make Dr. Birdie Riddle aware.

## 2016-12-06 MED ORDER — LEVOTHYROXINE SODIUM 50 MCG PO TABS: 50.0000 ug | ORAL_TABLET | Freq: Every day | ORAL | 6 refills | Status: DC

## 2016-12-06 NOTE — Telephone Encounter (Signed)
Noted.  Prescription has been filled

## 2016-12-06 NOTE — Telephone Encounter (Signed)
Medication filled to pharmacy as requested.   

## 2016-12-11 ENCOUNTER — Other Ambulatory Visit: Payer: Self-pay

## 2016-12-11 ENCOUNTER — Telehealth: Payer: Self-pay | Admitting: Family Medicine

## 2016-12-11 ENCOUNTER — Ambulatory Visit (INDEPENDENT_AMBULATORY_CARE_PROVIDER_SITE_OTHER): Payer: Medicare Other | Admitting: Physician Assistant

## 2016-12-11 ENCOUNTER — Encounter: Payer: Self-pay | Admitting: Physician Assistant

## 2016-12-11 VITALS — BP 120/68 | HR 89 | Temp 98.0°F | Resp 14 | Ht 65.0 in | Wt 220.0 lb

## 2016-12-11 DIAGNOSIS — B9689 Other specified bacterial agents as the cause of diseases classified elsewhere: Secondary | ICD-10-CM

## 2016-12-11 DIAGNOSIS — J019 Acute sinusitis, unspecified: Secondary | ICD-10-CM

## 2016-12-11 MED ORDER — AMOXICILLIN-POT CLAVULANATE 875-125 MG PO TABS: 1.0000 | ORAL_TABLET | Freq: Two times a day (BID) | ORAL | 0 refills | Status: DC

## 2016-12-11 MED ORDER — BENZONATATE 100 MG PO CAPS: 100.0000 mg | ORAL_CAPSULE | Freq: Three times a day (TID) | ORAL | 0 refills | Status: DC | PRN

## 2016-12-11 NOTE — Progress Notes (Signed)
Patient presents to clinic today c/o > 1 week of sinus pressure, sinus pain, L ear pain, fatigue and cough. Patient endorses sore throat, PND. Cough is now productive of green sputum. Denies chest pain. Has notes some chest tightness on occasion. Denies sick contact. Has taken some Dayquil for symptoms with only some improvement.   Past Medical History:  Diagnosis Date  . Arthritis   . Bacterial infection    in lungs in Jan 2015  . Bilateral lower extremity edema   . Cancer ([place])    skin cancer, back, legs melonama  . Chronic back pain   . GERD (gastroesophageal reflux disease)    takes Omeprazole daily  . Headache(784.0)   . History of migraine    last one about 15 yrs ago  . Hyperlipidemia    takes Zetia daily  . Hypertension    takes HCTZ daily  . Joint pain   . Joint swelling   . Myocardial infarction [facility])    pt states EKG always shows infarct but no change from previous ekg;never knew anything about it  . Nocturia   . Osteoarthritis   . Pneumonia    hx of-many yrs ago  . Pneumonia   . PONV (postoperative nausea and vomiting)   . Shortness of breath    with exertion  . Urinary frequency   . Urinary incontinence   . Urinary urgency     Current Outpatient Medications on File Prior to Visit  Medication Sig Dispense Refill  . acetaminophen (TYLENOL) 500 MG tablet Take 1 tablet (500 mg total) by mouth every 8 (eight) hours as needed for moderate pain. 90 tablet 1  . allopurinol (ZYLOPRIM) 300 MG tablet Take 300 mg by mouth daily.  0  . BEE POLLEN PO Take by mouth.    . Coenzyme Q10 (CO Q 10 PO) Take by mouth.    . cyclobenzaprine (FLEXERIL) 5 MG tablet Take 1 tablet (5 mg total) by mouth 2 (two) times daily as needed for muscle spasms. 60 tablet 0  . ezetimibe (ZETIA) 10 MG tablet Take 1 tablet (10 mg total) daily by mouth. 30 tablet 5  . fenofibrate 160 MG tablet take 1 tablet by mouth once daily 30 tablet 6  . fluticasone (FLONASE) 50 MCG/ACT nasal spray Place 2  sprays into both nostrils daily. 16 g 6  . furosemide (LASIX) 40 MG tablet Take 40 mg by mouth daily.   0  . gabapentin (NEURONTIN) 300 MG capsule take 1 capsule by mouth every evening for 7 days then take 1 capsule bid for 7 days then 1 capsule tid  0  . ibuprofen (ADVIL,MOTRIN) 200 MG tablet Take 200 mg by mouth every 6 (six) hours as needed.    [name] levothyroxine (SYNTHROID, LEVOTHROID) 50 MCG tablet Take 1 tablet (50 mcg total) by mouth daily. 30 tablet 6  . loratadine (CLARITIN) 10 MG tablet Take 10 mg by mouth daily.    . naproxen sodium (ANAPROX) 220 MG tablet Take 220 mg by mouth 2 (two) times daily with a meal.    . pantoprazole (PROTONIX) 40 MG tablet Take 1 tablet (40 mg total) by mouth daily. 30 tablet 3   No current facility-administered medications on file prior to visit.     Allergies  Allergen Reactions  . Aspirin Other (See Comments)    Noted caused 2 holes in retina, not sure   . Codeine Hives  . Tizanidine Hcl Other (See Comments)    Confusion  Family History  Problem Relation Age of Onset  . Kidney disease Mother   . Heart disease Mother   . Hypertension Mother   . Varicose Veins Mother   . Kidney failure Mother   . Alcohol abuse Father   . Stroke Father        several   . Hypertension Father   . Varicose Veins Father   . Alcohol abuse Brother   . Hyperlipidemia Brother   . Hypertension Brother   . Early death Brother   . Varicose Veins Brother   . Heart disease Brother   . Hypertension Brother   . Arthritis Brother        Gout  . Varicose Veins Son   . Deep vein thrombosis Son   . Breast cancer Maternal Aunt     Social History   Socioeconomic History  . Marital status: Widowed    Spouse name: None  . Number of children: None  . Years of education: None  . Highest education level: None  Social Needs  . Financial resource strain: None  . Food insecurity - worry: None  . Food insecurity - inability: None  . Transportation needs - medical:  None  . Transportation needs - non-medical: None  Occupational History  . None  Tobacco Use  . Smoking status: Never Smoker  . Smokeless tobacco: Never Used  Substance and Sexual Activity  . Alcohol use: No  . Drug use: No  . Sexual activity: None  Other Topics Concern  . None  Social History Narrative  . None    Review of Systems - See HPI.  All other ROS are negative.  BP 120/68   Pulse 89   Temp 98 F (36.7 C) (Oral)   Resp 14   Ht 5' 5" (1.651 m)   Wt 220 lb (99.8 kg)   SpO2 96%   BMI 36.61 kg/m   Physical Exam  Constitutional: She is well-developed, well-nourished, and in no distress.  HENT:  Head: Normocephalic and atraumatic.  Right Ear: Tympanic membrane and external ear normal.  Left Ear: Tympanic membrane and external ear normal.  Nose: Mucosal edema and rhinorrhea present. Right sinus exhibits frontal sinus tenderness. Left sinus exhibits frontal sinus tenderness.  Mouth/Throat: Uvula is midline and oropharynx is clear and moist. No oropharyngeal exudate, posterior oropharyngeal edema, posterior oropharyngeal erythema or tonsillar abscesses.  Eyes: Conjunctivae are normal.  Neck: Neck supple.  Cardiovascular: Normal rate, regular rhythm, normal heart sounds and intact distal pulses.  Pulmonary/Chest: Effort normal and breath sounds normal. No respiratory distress. She has no wheezes. She has no rales. She exhibits no tenderness.  Skin: Skin is warm and dry. No rash noted.  Vitals reviewed.   Recent Results (from the past 2160 hour(s))  Basic metabolic panel     Status: Abnormal   Collection Time: 10/09/16 11:40 AM  Result Value Ref Range   Sodium 135 135 - 145 mmol/L   Potassium 4.2 3.5 - 5.1 mmol/L   Chloride 103 101 - 111 mmol/L   CO2 25 22 - 32 mmol/L   Glucose, Bld 98 65 - 99 mg/dL   BUN 30 (H) 6 - 20 mg/dL   Creatinine, Ser 1.64 (H) 0.44 - 1.00 mg/dL   Calcium 9.0 8.9 - 10.3 mg/dL   GFR calc non Af Amer 28 (L) >60 mL/min   GFR calc Af Amer 33  (L) >60 mL/min    Comment: (NOTE) The eGFR has been calculated using the CKD EPI  equation. This calculation has not been validated in all clinical situations. eGFR's persistently <60 mL/min signify possible Chronic Kidney Disease.    Anion gap 7 5 - 15  CBC     Status: Abnormal   Collection Time: 10/09/16 11:40 AM  Result Value Ref Range   WBC 10.7 (H) 4.0 - 10.5 K/uL   RBC 3.93 3.87 - 5.11 MIL/uL   Hemoglobin 11.9 (L) 12.0 - 15.0 g/dL   HCT 36.3 36.0 - 46.0 %   MCV 92.4 78.0 - 100.0 fL   MCH 30.3 26.0 - 34.0 pg   MCHC 32.8 30.0 - 36.0 g/dL   RDW 14.4 11.5 - 15.5 %   Platelets 331 150 - 400 K/uL  Protime-INR     Status: None   Collection Time: 10/09/16 11:40 AM  Result Value Ref Range   Prothrombin Time 13.9 11.4 - 15.2 seconds   INR 1.08   Hepatic function panel     Status: None   Collection Time: 10/09/16 11:40 AM  Result Value Ref Range   Total Protein 7.6 6.5 - 8.1 g/dL   Albumin 3.9 3.5 - 5.0 g/dL   AST 38 15 - 41 U/L   ALT 21 14 - 54 U/L   Alkaline Phosphatase 81 38 - 126 U/L   Total Bilirubin 0.6 0.3 - 1.2 mg/dL   Bilirubin, Direct 0.1 0.1 - 0.5 mg/dL   Indirect Bilirubin 0.5 0.3 - 0.9 mg/dL  Lipase, blood     Status: None   Collection Time: 10/09/16 11:40 AM  Result Value Ref Range   Lipase 26 11 - 51 U/L  CBG monitoring, ED     Status: None   Collection Time: 10/09/16 11:42 AM  Result Value Ref Range   Glucose-Capillary 94 65 - 99 mg/dL  TSH     Status: None   Collection Time: 10/09/16 12:55 PM  Result Value Ref Range   TSH 1.486 0.350 - 4.500 uIU/mL    Comment: Performed by a 3rd Generation assay with a functional sensitivity of <=0.01 uIU/mL. Performed at [facility], [place] [address], [place], [place] [address]   Basic metabolic panel     Status: Abnormal   Collection Time: 10/10/16  9:51 AM  Result Value Ref Range   Sodium 139 135 - 145 mEq/L   Potassium 4.9 3.5 - 5.1 mEq/L   Chloride 102 96 - 112 mEq/L   CO2 29 19 - 32 mEq/L   Glucose, Bld  88 70 - 99 mg/dL   BUN 27 (H) 6 - 23 mg/dL   Creatinine, Ser 1.53 (H) 0.40 - 1.20 mg/dL   Calcium 9.6 8.4 - 10.5 mg/dL   GFR 34.54 (L) >60.00 mL/min  Basic metabolic panel     Status: Abnormal   Collection Time: 10/20/16  2:48 PM  Result Value Ref Range   Sodium 138 135 - 145 mmol/L   Potassium 3.9 3.5 - 5.1 mmol/L   Chloride 105 101 - 111 mmol/L   CO2 26 22 - 32 mmol/L   Glucose, Bld 106 (H) 65 - 99 mg/dL   BUN 31 (H) 6 - 20 mg/dL   Creatinine, Ser 1.65 (H) 0.44 - 1.00 mg/dL   Calcium 8.6 (L) 8.9 - 10.3 mg/dL   GFR calc non Af Amer 28 (L) >60 mL/min   GFR calc Af Amer 32 (L) >60 mL/min    Comment: (NOTE) The eGFR has been calculated using the CKD EPI equation. This calculation has not been validated  in all clinical situations. eGFR's persistently <60 mL/min signify possible Chronic Kidney Disease.    Anion gap 7 5 - 15  CBC     Status: Abnormal   Collection Time: 10/20/16  2:48 PM  Result Value Ref Range   WBC 9.1 4.0 - 10.5 K/uL   RBC 3.73 (L) 3.87 - 5.11 MIL/uL   Hemoglobin 11.2 (L) 12.0 - 15.0 g/dL   HCT 34.8 (L) 36.0 - 46.0 %   MCV 93.3 78.0 - 100.0 fL   MCH 30.0 26.0 - 34.0 pg   MCHC 32.2 30.0 - 36.0 g/dL   RDW 14.4 11.5 - 15.5 %   Platelets 316 150 - 400 K/uL  Urinalysis, Routine w reflex microscopic     Status: Abnormal   Collection Time: 10/20/16  2:59 PM  Result Value Ref Range   Color, Urine YELLOW YELLOW   APPearance CLEAR CLEAR   Specific Gravity, Urine 1.010 1.005 - 1.030   pH 7.0 5.0 - 8.0   Glucose, UA NEGATIVE NEGATIVE mg/dL   Hgb urine dipstick NEGATIVE NEGATIVE   Bilirubin Urine NEGATIVE NEGATIVE   Ketones, ur NEGATIVE NEGATIVE mg/dL   Protein, ur NEGATIVE NEGATIVE mg/dL   Nitrite NEGATIVE NEGATIVE   Leukocytes, UA TRACE (A) NEGATIVE  Urinalysis, Microscopic (reflex)     Status: Abnormal   Collection Time: 10/20/16  2:59 PM  Result Value Ref Range   RBC / HPF 0-5 0 - 5 RBC/hpf   WBC, UA 0-5 0 - 5 WBC/hpf   Bacteria, UA MANY (A) NONE SEEN    Squamous Epithelial / LPF 0-5 (A) NONE SEEN   Hyaline Casts, UA PRESENT   CBG monitoring, ED     Status: None   Collection Time: 10/20/16  3:18 PM  Result Value Ref Range   Glucose-Capillary 92 65 - 99 mg/dL  Calcium     Status: None   Collection Time: 10/28/16  4:47 PM  Result Value Ref Range   Calcium 9.5 8.6 - 10.4 mg/dL  Magnesium     Status: None   Collection Time: 10/28/16  4:47 PM  Result Value Ref Range   Magnesium 2.0 1.5 - 2.5 mg/dL  Ferritin     Status: None   Collection Time: 10/28/16  4:47 PM  Result Value Ref Range   Ferritin 36 20 - 288 ng/mL  Basic metabolic panel     Status: Abnormal   Collection Time: 10/29/16  9:25 AM  Result Value Ref Range   Sodium 141 135 - 145 mEq/L   Potassium 4.5 3.5 - 5.1 mEq/L   Chloride 103 96 - 112 mEq/L   CO2 30 19 - 32 mEq/L   Glucose, Bld 89 70 - 99 mg/dL   BUN 42 (H) 6 - 23 mg/dL   Creatinine, Ser 1.56 (H) 0.40 - 1.20 mg/dL   Calcium 9.5 8.4 - 10.5 mg/dL   GFR 33.77 (L) >60.00 mL/min  Basic metabolic panel     Status: Abnormal   Collection Time: 11/08/16  8:13 AM  Result Value Ref Range   Sodium 137 135 - 145 mEq/L   Potassium 4.8 3.5 - 5.1 mEq/L   Chloride 102 96 - 112 mEq/L   CO2 28 19 - 32 mEq/L   Glucose, Bld 79 70 - 99 mg/dL   BUN 32 (H) 6 - 23 mg/dL   Creatinine, Ser 1.51 (H) 0.40 - 1.20 mg/dL   Calcium 9.2 8.4 - 10.5 mg/dL   GFR 35.06 (L) >60.00 mL/min  Assessment/Plan: 1. Acute bacterial sinusitis Rx Augmentin.  Increase fluids.  Rest.  Saline nasal spray.  Probiotic.  Mucinex as directed.  Humidifier in bedroom. Tessalon per orders.  Call or return to clinic if symptoms are not improving.  - amoxicillin-clavulanate (AUGMENTIN) 875-125 MG tablet; Take 1 tablet by mouth 2 (two) times daily.  Dispense: 14 tablet; Refill: 0 - benzonatate (TESSALON) 100 MG capsule; Take 1 capsule (100 mg total) by mouth 3 (three) times daily as needed for cough.  Dispense: 30 capsule; Refill: 0   Leeanne Rio, PA-C

## 2016-12-11 NOTE — Telephone Encounter (Signed)
I did send in as the generic. Will have to have her take Delsym over-the-counter for cough. This works pretty well.

## 2016-12-11 NOTE — Telephone Encounter (Signed)
Copied from [place] [phone]. Topic: General - Other >> Dec 11, 2016  1:15 PM Darl Householder, RMA wrote: Reason for CRM: patient called to notify Kristen Manning that insurance will not cover the medication he prescribed for her today which was Tessalon capsule 100 mg, please send a generic for this medication to [facility] on [address]

## 2016-12-11 NOTE — Telephone Encounter (Signed)
LM for patient to call to discuss.  

## 2016-12-11 NOTE — Patient Instructions (Signed)
Please take antibiotic as directed.  Increase fluid intake.  Use Saline nasal spray.  Take a daily multivitamin. Use the Tessalon as directed for cough.  Place a humidifier in the bedroom.  Please call or return clinic if symptoms are not improving.  Sinusitis Sinusitis is redness, soreness, and swelling (inflammation) of the paranasal sinuses. Paranasal sinuses are air pockets within the bones of your face (beneath the eyes, the middle of the forehead, or above the eyes). In healthy paranasal sinuses, mucus is able to drain out, and air is able to circulate through them by way of your nose. However, when your paranasal sinuses are inflamed, mucus and air can become trapped. This can allow bacteria and other germs to grow and cause infection. Sinusitis can develop quickly and last only a short time (acute) or continue over a long period (chronic). Sinusitis that lasts for more than 12 weeks is considered chronic.  CAUSES  Causes of sinusitis include:  Allergies.  Structural abnormalities, such as displacement of the cartilage that separates your nostrils (deviated septum), which can decrease the air flow through your nose and sinuses and affect sinus drainage.  Functional abnormalities, such as when the small hairs (cilia) that line your sinuses and help remove mucus do not work properly or are not present. SYMPTOMS  Symptoms of acute and chronic sinusitis are the same. The primary symptoms are pain and pressure around the affected sinuses. Other symptoms include:  Upper toothache.  Earache.  Headache.  Bad breath.  Decreased sense of smell and taste.  A cough, which worsens when you are lying flat.  Fatigue.  Fever.  Thick drainage from your nose, which often is green and may contain pus (purulent).  Swelling and warmth over the affected sinuses. DIAGNOSIS  Your caregiver will perform a physical exam. During the exam, your caregiver may:  Look in your nose for signs of abnormal  growths in your nostrils (nasal polyps).  Tap over the affected sinus to check for signs of infection.  View the inside of your sinuses (endoscopy) with a special imaging device with a light attached (endoscope), which is inserted into your sinuses. If your caregiver suspects that you have chronic sinusitis, one or more of the following tests may be recommended:  Allergy tests.  Nasal culture A sample of mucus is taken from your nose and sent to a lab and screened for bacteria.  Nasal cytology A sample of mucus is taken from your nose and examined by your caregiver to determine if your sinusitis is related to an allergy. TREATMENT  Most cases of acute sinusitis are related to a viral infection and will resolve on their own within 10 days. Sometimes medicines are prescribed to help relieve symptoms (pain medicine, decongestants, nasal steroid sprays, or saline sprays).  However, for sinusitis related to a bacterial infection, your caregiver will prescribe antibiotic medicines. These are medicines that will help kill the bacteria causing the infection.  Rarely, sinusitis is caused by a fungal infection. In theses cases, your caregiver will prescribe antifungal medicine. For some cases of chronic sinusitis, surgery is needed. Generally, these are cases in which sinusitis recurs more than 3 times per year, despite other treatments. HOME CARE INSTRUCTIONS   Drink plenty of water. Water helps thin the mucus so your sinuses can drain more easily.  Use a humidifier.  Inhale steam 3 to 4 times a day (for example, sit in the bathroom with the shower running).  Apply a warm, moist washcloth to your  face 3 to 4 times a day, or as directed by your caregiver.  Use saline nasal sprays to help moisten and clean your sinuses.  Take over-the-counter or prescription medicines for pain, discomfort, or fever only as directed by your caregiver. SEEK IMMEDIATE MEDICAL CARE IF:  You have increasing pain or  severe headaches.  You have nausea, vomiting, or drowsiness.  You have swelling around your face.  You have vision problems.  You have a stiff neck.  You have difficulty breathing. MAKE SURE YOU:   Understand these instructions.  Will watch your condition.  Will get help right away if you are not doing well or get worse. Document Released: 12/24/2004 Document Revised: 03/18/2011 Document Reviewed: 01/08/2011 [facility] Patient Information 2014 [place], [place].

## 2016-12-12 NOTE — Telephone Encounter (Signed)
Patient said she called back to the pharmacy and she was able to get the medicine.

## 2017-01-03 ENCOUNTER — Other Ambulatory Visit: Payer: Self-pay | Admitting: Emergency Medicine

## 2017-01-03 MED ORDER — FENOFIBRATE 160 MG PO TABS: 160.0000 mg | ORAL_TABLET | Freq: Every day | ORAL | 6 refills | Status: DC

## 2017-01-15 NOTE — Progress Notes (Signed)
Subjective:   Kristen Manning is a 84 y.o. female who presents for Medicare Annual (Subsequent) preventive examination.  Review of Systems:  No ROS.  Medicare Wellness Visit. Additional risk factors are reflected in the social history.    Sleep patterns: Home Safety/Smoke Alarms: Feels safe in home. Smoke alarms in place.  Living environment; residence and Firearm Safety:  [place] Safety/Bike Helmet: Wears seat belt.   Female:   Pap-N/A       Mammo-06/12/2016, Negative.        Dexa scan-10/16/2016, Osteopenia.        CCS-     Objective:     Vitals: There were no vitals taken for this visit.  There is no height or weight on file to calculate BMI.  Advanced Directives 10/20/2016 10/09/2016 09/11/2016 09/06/2016 12/19/2015 08/31/2015 04/03/2015  Does Patient Have a Medical Advance Directive? No Yes Yes Yes Yes No Yes  Type of Industrial/product designer of [place];Living will [place];Living will [place];Living will [place];Living will - [place];Living will  Does patient want to make changes to medical advance directive? - - - - No - Patient declined - No - Patient declined  Copy of [place] in Chart? No - copy requested - No - copy requested - - - No - copy requested  Would patient like information on creating a medical advance directive? - - - - - No - patient declined information -  Pre-existing out of facility DNR order (yellow form or pink MOST form) - - - - - - -    Tobacco Social History   Tobacco Use  Smoking Status Never Smoker  Smokeless Tobacco Never Used     Counseling given: Not Answered   Past Medical History:  Diagnosis Date  . Arthritis   . Bacterial infection    in lungs in Jan 2015  . Bilateral lower extremity edema   . Cancer ([place])    skin cancer, back, legs melonama  . Chronic back pain   . GERD  (gastroesophageal reflux disease)    takes Omeprazole daily  . Headache(784.0)   . History of migraine    last one about 15 yrs ago  . Hyperlipidemia    takes Zetia daily  . Hypertension    takes HCTZ daily  . Joint pain   . Joint swelling   . Myocardial infarction [facility])    pt states EKG always shows infarct but no change from previous ekg;never knew anything about it  . Nocturia   . Osteoarthritis   . Pneumonia    hx of-many yrs ago  . Pneumonia   . PONV (postoperative nausea and vomiting)   . Shortness of breath    with exertion  . Urinary frequency   . Urinary incontinence   . Urinary urgency    Past Surgical History:  Procedure Laterality Date  . bladder tack  1998  . BLEPHAROPLASTY Bilateral   . JOINT REPLACEMENT Left 10/02/2009  . JOINT REPLACEMENT Right 02-22-13   Knee  . KNEE SURGERY Bilateral 2009 and 2011  . OOPHORECTOMY  1998   only one  . TONSILLECTOMY  as a child ? date  . TOTAL KNEE ARTHROPLASTY Right 02/22/2013   DR Ronnie Derby  . TOTAL KNEE ARTHROPLASTY Right 02/22/2013   Procedure: TOTAL KNEE ARTHROPLASTY;  Surgeon: Vickey Huger, MD;  Location: [place];  Service: Orthopedics;  Laterality: Right;  Family History  Problem Relation Age of Onset  . Kidney disease Mother   . Heart disease Mother   . Hypertension Mother   . Varicose Veins Mother   . Kidney failure Mother   . Alcohol abuse Father   . Stroke Father        several   . Hypertension Father   . Varicose Veins Father   . Alcohol abuse Brother   . Hyperlipidemia Brother   . Hypertension Brother   . Early death Brother   . Varicose Veins Brother   . Heart disease Brother   . Hypertension Brother   . Arthritis Brother        Gout  . Varicose Veins Son   . Deep vein thrombosis Son   . Breast cancer Maternal Aunt    Social History   Socioeconomic History  . Marital status: Widowed    Spouse name: Not on file  . Number of children: Not on file  . Years of education: Not on file  . Highest  education level: Not on file  Social Needs  . Financial resource strain: Not on file  . Food insecurity - worry: Not on file  . Food insecurity - inability: Not on file  . Transportation needs - medical: Not on file  . Transportation needs - non-medical: Not on file  Occupational History  . Not on file  Tobacco Use  . Smoking status: Never Smoker  . Smokeless tobacco: Never Used  Substance and Sexual Activity  . Alcohol use: No  . Drug use: No  . Sexual activity: Not on file  Other Topics Concern  . Not on file  Social History Narrative  . Not on file    Outpatient Encounter Medications as of 01/16/2017  Medication Sig  . acetaminophen (TYLENOL) 500 MG tablet Take 1 tablet (500 mg total) by mouth every 8 (eight) hours as needed for moderate pain.  [name] allopurinol (ZYLOPRIM) 300 MG tablet Take 300 mg by mouth daily.  [name] amoxicillin-clavulanate (AUGMENTIN) 875-125 MG tablet Take 1 tablet by mouth 2 (two) times daily.  [name] BEE POLLEN PO Take by mouth.  . benzonatate (TESSALON) 100 MG capsule Take 1 capsule (100 mg total) by mouth 3 (three) times daily as needed for cough.  . Coenzyme Q10 (CO Q 10 PO) Take by mouth.  . cyclobenzaprine (FLEXERIL) 5 MG tablet Take 1 tablet (5 mg total) by mouth 2 (two) times daily as needed for muscle spasms.  [name] ezetimibe (ZETIA) 10 MG tablet Take 1 tablet (10 mg total) daily by mouth.  . fenofibrate 160 MG tablet Take 1 tablet (160 mg total) by mouth daily.  . fluticasone (FLONASE) 50 MCG/ACT nasal spray Place 2 sprays into both nostrils daily.  . furosemide (LASIX) 40 MG tablet Take 40 mg by mouth daily.   [name] gabapentin (NEURONTIN) 300 MG capsule take 1 capsule by mouth every evening for 7 days then take 1 capsule bid for 7 days then 1 capsule tid  . ibuprofen (ADVIL,MOTRIN) 200 MG tablet Take 200 mg by mouth every 6 (six) hours as needed.  [name] levothyroxine (SYNTHROID, LEVOTHROID) 50 MCG tablet Take 1 tablet (50 mcg total) by mouth daily.  [name] loratadine  (CLARITIN) 10 MG tablet Take 10 mg by mouth daily.  . naproxen sodium (ANAPROX) 220 MG tablet Take 220 mg by mouth 2 (two) times daily with a meal.  . pantoprazole (PROTONIX) 40 MG tablet Take 1 tablet (40 mg total) by mouth daily.  No facility-administered encounter medications on file as of 01/16/2017.     Activities of Daily Living In your present state of health, do you have any difficulty performing the following activities: 09/11/2016 09/11/2016  Hearing? Y Y  Comment - Hearing aids, does not wear  Vision? N N  Difficulty concentrating or making decisions? N N  Walking or climbing stairs? Y Y  Comment - SOB with climbing steps  Dressing or bathing? N N  Doing errands, shopping? N N  Preparing Food and eating ? - N  Using the Toilet? - N  In the past six months, have you accidently leaked urine? - N  Do you have problems with loss of bowel control? - N  Managing your Medications? - N  Managing your Finances? - N  Housekeeping or managing your Housekeeping? - N  Some recent data might be hidden    Patient Care Team: Midge Minium, MD as PCP - General (Family Medicine) Vickey Huger, MD as Consulting Physician (Orthopedic Surgery) Harriett Sine, MD as Consulting Physician (Dermatology) Jana Half, DPM as Consulting Physician (Podiatry) Cameron Sprang, MD as Consulting Physician (Neurology) Lorretta Harp, MD as Consulting Physician (Cardiology)    Assessment:   This is a routine wellness examination for [employer].  Exercise Activities and Dietary recommendations   Diet (meal preparation, eat out, water intake, caffeinated beverages, dairy products, fruits and vegetables):   Breakfast: Lunch:  Dinner:      Goals    . Eat more fruits and vegetables    . Increase physical activity     Silver Sneakers Go back to [employer] with next door neighbor.         Fall Risk Fall Risk  10/29/2016 09/16/2016 09/11/2016 09/11/2016 07/30/2016  Falls in the past  year? No No No No No  Number falls in past yr: - - - - -  Injury with Fall? - - - - -  Risk for fall due to : - - - - -  Risk for fall due to: Comment - - - - -    Depression Screen PHQ 2/9 Scores 09/11/2016 09/11/2016 07/30/2016 05/13/2016  PHQ - 2 Score 0 0 1 0  PHQ- 9 Score 0 - 1 0     Cognitive Function MMSE - Mini Mental State Exam 04/03/2015  Orientation to time 5  Orientation to Place 5  Registration 3  Attention/ Calculation 5  Recall 3  Language- name 2 objects 2  Language- repeat 1  Language- follow 3 step command 3  Language- read & follow direction 1  Write a sentence 1  Copy design 1  Total score 30        Immunization History  Administered Date(s) Administered  . Influenza,inj,Quad PF,6+ Mos 09/21/2014  . Influenza-Unspecified 10/29/2016  . Pneumococcal Conjugate-13 09/21/2014  . Pneumococcal Polysaccharide-23 11/08/2016  . Td 11/26/2012    Screening Tests Health Maintenance  Topic Date Due  . MAMMOGRAM  06/12/2017  . TETANUS/TDAP  11/27/2022  . INFLUENZA VACCINE  Completed  . DEXA SCAN  Completed  . PNA vac Low Risk Adult  Completed        Plan:     I have personally reviewed and noted the following in the patient's chart:   . Medical and social history . Use of alcohol, tobacco or illicit drugs  . Current medications and supplements . Functional ability and status . Nutritional status . Physical activity . Advanced directives . List of other physicians .  Hospitalizations, surgeries, and ER visits in previous 12 months . Vitals . Screenings to include cognitive, depression, and falls . Referrals and appointments  In addition, I have reviewed and discussed with patient certain preventive protocols, quality metrics, and best practice recommendations. A written personalized care plan for preventive services as well as general preventive health recommendations were provided to patient.     Gerilyn Nestle, RN  01/15/2017

## 2017-01-16 ENCOUNTER — Other Ambulatory Visit: Payer: Self-pay

## 2017-01-16 ENCOUNTER — Ambulatory Visit (INDEPENDENT_AMBULATORY_CARE_PROVIDER_SITE_OTHER): Payer: Medicare Other | Admitting: Family Medicine

## 2017-01-16 ENCOUNTER — Encounter: Payer: Self-pay | Admitting: Family Medicine

## 2017-01-16 VITALS — BP 122/72 | HR 68 | Resp 16 | Ht 65.0 in | Wt 219.0 lb

## 2017-01-16 DIAGNOSIS — E7800 Pure hypercholesterolemia, unspecified: Secondary | ICD-10-CM

## 2017-01-16 DIAGNOSIS — E038 Other specified hypothyroidism: Secondary | ICD-10-CM

## 2017-01-16 LAB — TSH: TSH: 3.75 u[IU]/mL (ref 0.35–4.50)

## 2017-01-16 LAB — BASIC METABOLIC PANEL
BUN: 29 mg/dL — ABNORMAL HIGH (ref 6–23)
CO2: 29 mEq/L (ref 19–32)
Calcium: 9.7 mg/dL (ref 8.4–10.5)
Chloride: 101 mEq/L (ref 96–112)
Creatinine, Ser: 1.59 mg/dL — ABNORMAL HIGH (ref 0.40–1.20)
GFR: 33.01 mL/min — ABNORMAL LOW (ref >60.00)
Glucose, Bld: 95 mg/dL (ref 70–99)
Potassium: 4.7 mEq/L (ref 3.5–5.1)
Sodium: 138 mEq/L (ref 135–145)

## 2017-01-16 LAB — LIPID PANEL
Cholesterol: 157 mg/dL (ref 0–200)
HDL: 53.3 mg/dL (ref >39.00)
LDL Cholesterol: 82 mg/dL (ref 0–99)
NonHDL: 103.65
Total CHOL/HDL Ratio: 3
Triglycerides: 108 mg/dL (ref 0.0–149.0)
VLDL: 21.6 mg/dL (ref 0.0–40.0)

## 2017-01-16 LAB — CBC WITH DIFFERENTIAL/PLATELET
Basophils Absolute: 0.1 10*3/uL (ref 0.0–0.1)
Basophils Relative: 0.8 % (ref 0.0–3.0)
Eosinophils Absolute: 0.2 10*3/uL (ref 0.0–0.7)
Eosinophils Relative: 2 % (ref 0.0–5.0)
HCT: 39.2 % (ref 36.0–46.0)
Hemoglobin: 12.8 g/dL (ref 12.0–15.0)
Lymphocytes Relative: 20.6 % (ref 12.0–46.0)
Lymphs Abs: 1.8 10*3/uL (ref 0.7–4.0)
MCHC: 32.7 g/dL (ref 30.0–36.0)
MCV: 91.8 fl (ref 78.0–100.0)
Monocytes Absolute: 0.7 10*3/uL (ref 0.1–1.0)
Monocytes Relative: 7.4 % (ref 3.0–12.0)
Neutro Abs: 6.2 10*3/uL (ref 1.4–7.7)
Neutrophils Relative %: 69.2 % (ref 43.0–77.0)
Platelets: 347 10*3/uL (ref 150.0–400.0)
RBC: 4.27 Mil/uL (ref 3.87–5.11)
RDW: 15.4 % (ref 11.5–15.5)
WBC: 9 10*3/uL (ref 4.0–10.5)

## 2017-01-16 LAB — HEPATIC FUNCTION PANEL
ALT: 12 U/L (ref 0–35)
AST: 21 U/L (ref 0–37)
Albumin: 4.1 g/dL (ref 3.5–5.2)
Alkaline Phosphatase: 82 U/L (ref 39–117)
Bilirubin, Direct: 0.1 mg/dL (ref 0.0–0.3)
Total Bilirubin: 0.5 mg/dL (ref 0.2–1.2)
Total Protein: 7.7 g/dL (ref 6.0–8.3)

## 2017-01-16 MED ORDER — LORATADINE 10 MG PO TABS: 10.0000 mg | ORAL_TABLET | Freq: Every day | ORAL | Status: DC

## 2017-01-16 MED ORDER — FUROSEMIDE 40 MG PO TABS: 40.0000 mg | ORAL_TABLET | Freq: Every day | ORAL | 0 refills | Status: DC

## 2017-01-16 NOTE — Patient Instructions (Signed)
Schedule your complete physical in 6 months We'll notify you of your lab results and make any changes if needed Continue to make healthy food choices and walk/exercise as you are able Call with any questions or concerns Happy New Year!!!

## 2017-01-16 NOTE — Progress Notes (Signed)
   Subjective:    Patient ID: KEYIA MORETTO, female    DOB: [birth year], 84 y.o.   MRN: [id number]  HPI Hyperlipidemia- chronic problem, on Fenofibrate and Zetia 10mg  daily.  No CP, SOB above baseline (w/ exertion), abd pain, N/V.  Hypothyroid- chronic problem, on Levothyroxine 83mcg daily.  Denies excessive fatigue, changes to skin/hair/nails.   Review of Systems For ROS see HPI     Objective:   Physical Exam  Constitutional: She is oriented to person, place, and time. She appears well-developed and well-nourished. No distress.  obese  HENT:  Head: Normocephalic and atraumatic.  Eyes: Conjunctivae and EOM are normal. Pupils are equal, round, and reactive to light.  Neck: Normal range of motion. Neck supple. No thyromegaly present.  Cardiovascular: Normal rate, regular rhythm, normal heart sounds and intact distal pulses.  No murmur heard. Pulmonary/Chest: Effort normal and breath sounds normal. No respiratory distress.  Abdominal: Soft. She exhibits no distension. There is no tenderness.  Musculoskeletal: She exhibits edema (chronic LE edema).  Lymphadenopathy:    She has no cervical adenopathy.  Neurological: She is alert and oriented to person, place, and time.  Skin: Skin is warm and dry.  Psychiatric: She has a normal mood and affect. Her behavior is normal.  Vitals reviewed.         Assessment & Plan:

## 2017-01-16 NOTE — Assessment & Plan Note (Signed)
Chronic problem.  Currently asymptomatic.  Check labs.  Adjust meds prn  

## 2017-01-16 NOTE — Assessment & Plan Note (Signed)
Chronic problem.  Intolerant to statins.  On Zetia and Fenofibrate w/o difficulty.  Check labs.  Adjust meds prn

## 2017-01-17 NOTE — Addendum Note (Signed)
Addended by: Sabas Sous R on: 01/17/2017 11:18 AM   Modules accepted: Level of Service, SmartSet

## 2017-01-30 ENCOUNTER — Telehealth: Payer: Self-pay | Admitting: Cardiovascular Disease

## 2017-02-18 ENCOUNTER — Ambulatory Visit: Payer: Medicaid Other | Admitting: Neurology

## 2017-02-27 ENCOUNTER — Ambulatory Visit: Payer: Medicare Other | Admitting: Family Medicine

## 2017-03-03 ENCOUNTER — Ambulatory Visit (INDEPENDENT_AMBULATORY_CARE_PROVIDER_SITE_OTHER): Payer: Medicare Other | Admitting: Family Medicine

## 2017-03-03 ENCOUNTER — Encounter: Payer: Self-pay | Admitting: Family Medicine

## 2017-03-03 ENCOUNTER — Other Ambulatory Visit: Payer: Self-pay

## 2017-03-03 VITALS — BP 123/81 | HR 86 | Temp 97.8°F | Resp 17 | Ht 65.0 in | Wt 219.0 lb

## 2017-03-03 DIAGNOSIS — J329 Chronic sinusitis, unspecified: Secondary | ICD-10-CM

## 2017-03-03 DIAGNOSIS — B9689 Other specified bacterial agents as the cause of diseases classified elsewhere: Secondary | ICD-10-CM | POA: Diagnosis not present

## 2017-03-03 MED ORDER — AMOXICILLIN 875 MG PO TABS
875.0000 mg | ORAL_TABLET | Freq: Two times a day (BID) | ORAL | 0 refills | Status: DC
Start: 2017-03-03 — End: 2017-08-01

## 2017-03-03 NOTE — Patient Instructions (Signed)
Follow up as scheduled or as needed Start the Amoxicillin twice daily- take w/ food Drink plenty of fluids REST! Call with any questions or concerns Hang in there!!!

## 2017-03-03 NOTE — Progress Notes (Signed)
   Subjective:    Patient ID: Kristen Manning, female    DOB: [birth year], 84 y.o.   MRN: [id number]  HPI URI- 'it's the same thing with my sinuses'.  + sinus pressure, nasal congestion, sore throat, L ear pain, body aches.  No fevers.  sxs started 'a couple of days ago'.  No known sick contacts.  No N/V.  Rare cough.   Review of Systems For ROS see HPI     Objective:   Physical Exam  Constitutional: She is oriented to person, place, and time. She appears well-developed and well-nourished. No distress.  HENT:  Head: Normocephalic and atraumatic.  Right Ear: Tympanic membrane normal.  Left Ear: Tympanic membrane normal.  Nose: Mucosal edema and rhinorrhea present. Right sinus exhibits maxillary sinus tenderness and frontal sinus tenderness. Left sinus exhibits maxillary sinus tenderness and frontal sinus tenderness.  Mouth/Throat: Uvula is midline and mucous membranes are normal. Posterior oropharyngeal erythema present. No oropharyngeal exudate.  Eyes: Conjunctivae and EOM are normal. Pupils are equal, round, and reactive to light.  Neck: Normal range of motion. Neck supple.  Cardiovascular: Normal rate, regular rhythm and normal heart sounds.  Pulmonary/Chest: Effort normal and breath sounds normal. No respiratory distress. She has no wheezes.  Lymphadenopathy:    She has no cervical adenopathy.  Neurological: She is alert and oriented to person, place, and time.  Psychiatric: She has a normal mood and affect. Her behavior is normal. Thought content normal.  Vitals reviewed.         Assessment & Plan:  Sinus infxn- new.  Pt's sxs and PE consistent w/ infxn.  She has hx of this.  Start abx.  Reviewed supportive care and red flags that should prompt return.  Pt expressed understanding and is in agreement w/ plan.

## 2017-03-04 DIAGNOSIS — I739 Peripheral vascular disease, unspecified: Secondary | ICD-10-CM | POA: Diagnosis not present

## 2017-03-24 DIAGNOSIS — L820 Inflamed seborrheic keratosis: Secondary | ICD-10-CM | POA: Diagnosis not present

## 2017-03-24 DIAGNOSIS — Z85820 Personal history of malignant melanoma of skin: Secondary | ICD-10-CM | POA: Diagnosis not present

## 2017-03-24 DIAGNOSIS — L821 Other seborrheic keratosis: Secondary | ICD-10-CM | POA: Diagnosis not present

## 2017-03-24 DIAGNOSIS — L570 Actinic keratosis: Secondary | ICD-10-CM | POA: Diagnosis not present

## 2017-03-24 DIAGNOSIS — L814 Other melanin hyperpigmentation: Secondary | ICD-10-CM | POA: Diagnosis not present

## 2017-03-24 DIAGNOSIS — Z85828 Personal history of other malignant neoplasm of skin: Secondary | ICD-10-CM | POA: Diagnosis not present

## 2017-03-31 ENCOUNTER — Other Ambulatory Visit: Payer: Self-pay

## 2017-03-31 ENCOUNTER — Ambulatory Visit (INDEPENDENT_AMBULATORY_CARE_PROVIDER_SITE_OTHER): Payer: Medicare Other | Admitting: Neurology

## 2017-03-31 ENCOUNTER — Telehealth: Payer: Self-pay | Admitting: Neurology

## 2017-03-31 ENCOUNTER — Encounter: Payer: Self-pay | Admitting: Neurology

## 2017-03-31 VITALS — BP 154/72 | HR 86 | Resp 18 | Ht 65.0 in | Wt 219.0 lb

## 2017-03-31 DIAGNOSIS — G629 Polyneuropathy, unspecified: Secondary | ICD-10-CM | POA: Diagnosis not present

## 2017-03-31 NOTE — Patient Instructions (Signed)
1. Start Lyrica samples 50mg : take 1 cap at night for 2 weeks, then increase to 1 capsule twice a day 2. Please let us know the name of the neurologist so we can request records 3. Follow-up in 4-5 months, call for any changes

## 2017-03-31 NOTE — Telephone Encounter (Signed)
Patient wanted to give [place] the name of the provider who did her EMG test. It was [facility] in [place]

## 2017-03-31 NOTE — Telephone Encounter (Signed)
Thank you :)

## 2017-03-31 NOTE — Progress Notes (Signed)
Rest   NEUROLOGY FOLLOW UP OFFICE NOTE  Kristen Manning [id number]  DOB: October 16, 1932  HISTORY OF PRESENT ILLNESS: I had the pleasure of seeing Kristen Manning in follow-up in the neurology clinic on 03/31/2017.  The patient was last seen 5 months ago for tension-type headaches. She had ER visits for altered mental status in October 2018 felt to be related to tizanidine intake. She has neuropathy and was prescribed gabapentin 300mg  in AM, 600mg  in PM by another neurologist who she reports told her she had "neuropathy everywhere" after EMG. Records requested for review. She however was starting to "think of death all the time." On her next refill, she got a print out of gabapentin side effects and saw this was on it, and stopped gabapentin. The thoughts of death have stopped. The gabapentin was prescribed for bilateral flank pain. It appeared to be helping with the pain, it is worse off medication, she takes prn Tylenol which really helps. She was initially seen for headaches, these have overall resolved, she is having a different type of headache today worse every morning, related to sinus congestion and occasional nasal drainage and sore throat. She will be seeing her PCP and asking for antibiotics. She denies any dizziness, diplopia. She has bilateral knee pain, no recent falls. She feels her feet don't go where she wants them to. She has left-sided neck pain, as well as pain in the mid-thoracic region radiating up her spine to the shoulders.   HPI: This is a pleasant 84 yo RH woman with a history of hypertension, hyperlipidemia, melanoma, who presented with headaches that started after knee surgery last February 2015. She had a difficult time during the admission, with no recollection of her hospital stay. She asked for hospital records which reported walking several steps, which she does not remember. She recalls waking up in pain, and "waking up" when she was to be transferred to the nursing home. She  did not have a good experience at the SNF, and feels she had headaches at that time but was taking pain medications for her knee. She started to notice the daily headaches once she got home in March. She reports headaches are constant over the left temporal region, usually a 2 or 3/10, but increasing in severity up to a 5 or 6/10. Headaches would radiate over the periorbital, frontal, and occipital regions, described as a throbbing pain that would cause some dizziness and nausea when more severe. She has a more intense headache today. She has noticed blurred vision in both eyes. She takes Advil 2-3 times a week when pain is more severe, but it only helps calm the pain down. She denies having any headache-free days in the past 4 months. She reports that she had trouble sleeping once she got home, bothered by the memories of other patients at the [facility]. She was prescribed Zoloft by her PCP but did not take it due to listed side effects on the package. She reports that sleep is a little better now, she usually gets 6 hours of sleep but wakes up several times to urinate due to the HCTZ she takes at bedtime. She rarely takes her brother's Ativan to sleep (1-2/month per patient). She reports a history of migraines with her period when younger, but has been headache-free for 15 years until last March. She denies any family history of migraines.   Diagnostic Data: I personally reviewed her MRI brain with and without contrast with the patient, there is a moderate amount  of chronic microvascular disease in the bilateral subcortical regions. There is a small 1.8 x 1.1 x 1.4 cm meningioma in the left temporal region with minimal mass effect, no edema. There is note of a prominent left PCA infundibulum extending off the left ICA versus small aneurysm, incompletely evaluated on this exam.  MRI brain 09/13/14 unchanged.  PAST MEDICAL HISTORY: Past Medical History:  Diagnosis Date  . Arthritis   . Bacterial infection    in  lungs in Jan 2015  . Bilateral lower extremity edema   . Cancer ([place])    skin cancer, back, legs melonama  . Chronic back pain   . GERD (gastroesophageal reflux disease)    takes Omeprazole daily  . Headache(784.0)   . History of migraine    last one about 15 yrs ago  . Hyperlipidemia    takes Zetia daily  . Hypertension    takes HCTZ daily  . Joint pain   . Joint swelling   . Myocardial infarction [facility])    pt states EKG always shows infarct but no change from previous ekg;never knew anything about it  . Nocturia   . Osteoarthritis   . Pneumonia    hx of-many yrs ago  . Pneumonia   . PONV (postoperative nausea and vomiting)   . Shortness of breath    with exertion  . Urinary frequency   . Urinary incontinence   . Urinary urgency     MEDICATIONS: Current Outpatient Medications on File Prior to Visit  Medication Sig Dispense Refill  . acetaminophen (TYLENOL) 500 MG tablet Take 1 tablet (500 mg total) by mouth every 8 (eight) hours as needed for moderate pain. 90 tablet 1  . allopurinol (ZYLOPRIM) 300 MG tablet Take 300 mg by mouth daily.  0  . amoxicillin (AMOXIL) 875 MG tablet Take 1 tablet (875 mg total) by mouth 2 (two) times daily. 20 tablet 0  . Calcium 600-200 MG-UNIT tablet Take 1 tablet by mouth daily.    [name] ezetimibe (ZETIA) 10 MG tablet Take 1 tablet (10 mg total) daily by mouth. 30 tablet 5  . fenofibrate 160 MG tablet Take 1 tablet (160 mg total) by mouth daily. 30 tablet 6  . fluticasone (FLONASE) 50 MCG/ACT nasal spray Place 2 sprays into both nostrils daily. 16 g 6  . furosemide (LASIX) 40 MG tablet Take 1 tablet (40 mg total) by mouth daily. 30 tablet 0  . gabapentin (NEURONTIN) 300 MG capsule take 1 capsule by mouth every evening for 7 days then take 1 capsule bid for 7 days then 1 capsule tid (Patient not taking)  0  . levothyroxine (SYNTHROID, LEVOTHROID) 50 MCG tablet Take 1 tablet (50 mcg total) by mouth daily. 30 tablet 6  . loratadine (CLARITIN) 10 MG  tablet Take 1 tablet (10 mg total) by mouth daily.    . naproxen sodium (ALEVE) 220 MG tablet Take 220 mg by mouth.    . Omega-3 Fatty Acids (FISH OIL) 1000 MG CAPS Take by mouth.    [name] omeprazole (PRILOSEC) 20 MG capsule Take 20 mg by mouth daily.     No current facility-administered medications on file prior to visit.     ALLERGIES: Allergies  Allergen Reactions  . Aspirin Other (See Comments)    Noted caused 2 holes in retina, not sure   . Codeine Hives  . Tizanidine Hcl Other (See Comments)    Confusion    FAMILY HISTORY: Family History  Problem Relation Age of Onset  .  Kidney disease Mother   . Heart disease Mother   . Hypertension Mother   . Varicose Veins Mother   . Kidney failure Mother   . Alcohol abuse Father   . Stroke Father        several   . Hypertension Father   . Varicose Veins Father   . Alcohol abuse Brother   . Hyperlipidemia Brother   . Hypertension Brother   . Early death Brother   . Varicose Veins Brother   . Heart disease Brother   . Hypertension Brother   . Arthritis Brother        Gout  . Varicose Veins Son   . Deep vein thrombosis Son   . Breast cancer Maternal Aunt     SOCIAL HISTORY: Social History   Socioeconomic History  . Marital status: Widowed    Spouse name: Not on file  . Number of children: Not on file  . Years of education: Not on file  . Highest education level: Not on file  Occupational History  . Not on file  Social Needs  . Financial resource strain: Not on file  . Food insecurity:    Worry: Not on file    Inability: Not on file  . Transportation needs:    Medical: Not on file    Non-medical: Not on file  Tobacco Use  . Smoking status: Never Smoker  . Smokeless tobacco: Never Used  Substance and Sexual Activity  . Alcohol use: No  . Drug use: No  . Sexual activity: Not on file  Lifestyle  . Physical activity:    Days per week: Not on file    Minutes per session: Not on file  . Stress: Not on file    Relationships  . Social connections:    Talks on phone: Not on file    Gets together: Not on file    Attends religious service: Not on file    Active member of club or organization: Not on file    Attends meetings of clubs or organizations: Not on file    Relationship status: Not on file  . Intimate partner violence:    Fear of current or ex partner: Not on file    Emotionally abused: Not on file    Physically abused: Not on file    Forced sexual activity: Not on file  Other Topics Concern  . Not on file  Social History Narrative  . Not on file    REVIEW OF SYSTEMS: Constitutional: No fevers, chills, or sweats, no generalized fatigue, change in appetite Eyes: No visual changes, double vision, eye pain Ear, nose and throat: No hearing loss, ear pain, nasal congestion, sore throat Cardiovascular: No chest pain, palpitations Respiratory:  No shortness of breath at rest or with exertion, wheezes GastrointestinaI: No nausea, vomiting, diarrhea, abdominal pain, fecal incontinence Genitourinary:  No dysuria, urinary retention or frequency Musculoskeletal:  + neck pain, back pain Integumentary: No rash, pruritus, skin lesions Neurological: as above Psychiatric: + depression, insomnia, anxiety Endocrine: No palpitations, fatigue, diaphoresis, mood swings, change in appetite, change in weight, increased thirst Hematologic/Lymphatic:  No anemia, purpura, petechiae. Allergic/Immunologic: no itchy/runny eyes,+ nasal congestion,no recent allergic reactions, rashes  PHYSICAL EXAM: Vitals:   03/31/17 1016  BP: (!) 154/72  Pulse: 86  Resp: 18   General: No acute distress Head:  Normocephalic/atraumatic Neck: supple, no paraspinal tenderness, full range of motion Heart:  Regular rate and rhythm Lungs:  Clear to auscultation bilaterally Back: No paraspinal  tenderness Skin/Extremities: No rash, no edema Neurological Exam: alert and oriented to person, place, and time. No aphasia or  dysarthria. Fund of knowledge is appropriate.  Recent and remote memory are intact.  Attention and concentration are normal.    Able to name objects and repeat phrases. Cranial nerves: Pupils equal, round, reactive to light.Extraocular movements intact with no nystagmus. Visual fields full. Facial sensation intact. No facial asymmetry. Tongue, uvula, palate midline.  Motor: Bulk and tone normal, muscle strength 5/5 throughout with no pronator drift, reporting pain on muscle testing of left LE.  Sensation to light touch intact.  No extinction to double simultaneous stimulation.  Deep tendon reflexes +1 throughout, toes downgoing.  Finger to nose testing intact.  Gait slow and cautious due to knee pain. Romberg negative.  IMPRESSION: This is a pleasant 84 yo RH woman with a history of hypertension, hyperlipidemia, melanoma, who presented with new onset daily headaches in March 2015. MRI brain had shown an incidental finding of a small meningioma over the left temporal region, stable in size with repeat imaging in 2016. The headaches she initially presented with have resolved and have not recurred. She presents today with neuropathic pain, more over the bilateral flank regions. She reports having an EMG/NCV with another neurologist, records will be requested for review. She was started on gabapentin which seemed to help but caused "thoughts of death." She is agreeable to trying low dose Lyrica 50mg  qhs x 2 weeks, then increase to 50mg  BID. Side effects were discussed, we may uptitrate as tolerated. She mostly has sinus headaches and will follow-up with PCP. She will follow-up in 4-5 months and knows to call for any changes.   Thank you for allowing me to participate in her care.  Please do not hesitate to call for any questions or concerns.  The duration of this appointment visit was 25 minutes of face-to-face time with the patient.  Greater than 50% of this time was spent in counseling, explanation of  diagnosis, planning of further management, and coordination of care.   Ellouise Newer, M.D.   CC: Dr. Birdie Riddle

## 2017-03-31 NOTE — Telephone Encounter (Signed)
Called Dr. Desiree Hane office and requested EMG report.

## 2017-04-18 NOTE — Progress Notes (Deleted)
Office Visit Note  Patient: Kristen Manning             Date of Birth: June 15, 1932           MRN: [id number]             PCP: Midge Minium, MD Referring: Midge Minium, MD Visit Date: 05/02/2017 Occupation: @GUAROCC @    Subjective:  No chief complaint on file.   History of Present Illness: Kristen Manning is a 84 y.o. female ***   Activities of Daily Living:  Patient reports morning stiffness for *** {minute/hour:19697}.   Patient {ACTIONS;DENIES/REPORTS:21021675::"Denies"} nocturnal pain.  Difficulty dressing/grooming: {ACTIONS;DENIES/REPORTS:21021675::"Denies"} Difficulty climbing stairs: {ACTIONS;DENIES/REPORTS:21021675::"Denies"} Difficulty getting out of chair: {ACTIONS;DENIES/REPORTS:21021675::"Denies"} Difficulty using hands for taps, buttons, cutlery, and/or writing: {ACTIONS;DENIES/REPORTS:21021675::"Denies"}   No Rheumatology ROS completed.   PMFS History:  Patient Active Problem List   Diagnosis Date Noted  . Muscle spasm 10/28/2016  . DDD (degenerative disc disease), lumbar 10/07/2016  . Primary osteoarthritis of both hands 10/07/2016  . Primary osteoarthritis of both feet 10/07/2016  . Tension-type headache, not intractable 09/16/2016  . GERD (gastroesophageal reflux disease) 09/11/2016  . ANA positive 09/02/2016  . Myalgia 06/26/2016  . Shortness of breath 03/22/2015  . History of total knee replacement, bilateral 03/20/2015  . Hypothyroidism 09/21/2014  . Cephalalgia 09/15/2014  . Gout 06/20/2014  . Lower leg edema 06/13/2014  . Pain in both feet 06/13/2014  . Chest wall pain 04/29/2014  . Meningioma ([place]) 10/07/2013  . Chronic tension-type headache, intractable 10/07/2013  . Pain of right lower leg 08/11/2013  . Neck pain 07/28/2013  . Swelling of limb 07/07/2013  . Headache(784.0) 07/07/2013  . Depression 06/23/2013  . Insomnia 05/26/2013  . Routine general medical examination at a health care facility 10/17/2011  . HTN  (hypertension) 07/03/2011  . Hyperlipidemia 07/03/2011  . Venous insufficiency, peripheral 07/03/2011    Past Medical History:  Diagnosis Date  . Arthritis   . Bacterial infection    in lungs in Jan 2015  . Bilateral lower extremity edema   . Cancer ([place])    skin cancer, back, legs melonama  . Chronic back pain   . GERD (gastroesophageal reflux disease)    takes Omeprazole daily  . Headache(784.0)   . History of migraine    last one about 15 yrs ago  . Hyperlipidemia    takes Zetia daily  . Hypertension    takes HCTZ daily  . Joint pain   . Joint swelling   . Myocardial infarction [facility])    pt states EKG always shows infarct but no change from previous ekg;never knew anything about it  . Nocturia   . Osteoarthritis   . Pneumonia    hx of-many yrs ago  . Pneumonia   . PONV (postoperative nausea and vomiting)   . Shortness of breath    with exertion  . Urinary frequency   . Urinary incontinence   . Urinary urgency     Family History  Problem Relation Age of Onset  . Kidney disease Mother   . Heart disease Mother   . Hypertension Mother   . Varicose Veins Mother   . Kidney failure Mother   . Alcohol abuse Father   . Stroke Father        several   . Hypertension Father   . Varicose Veins Father   . Alcohol abuse Brother   . Hyperlipidemia Brother   . Hypertension Brother   . Early death Brother   .  Varicose Veins Brother   . Heart disease Brother   . Hypertension Brother   . Arthritis Brother        Gout  . Varicose Veins Son   . Deep vein thrombosis Son   . Breast cancer Maternal Aunt    Past Surgical History:  Procedure Laterality Date  . bladder tack  1998  . BLEPHAROPLASTY Bilateral   . JOINT REPLACEMENT Left 10/02/2009  . JOINT REPLACEMENT Right 02-22-13   Knee  . KNEE SURGERY Bilateral 2009 and 2011  . OOPHORECTOMY  1998   only one  . TONSILLECTOMY  as a child ? date  . TOTAL KNEE ARTHROPLASTY Right 02/22/2013   DR Ronnie Derby  . TOTAL KNEE  ARTHROPLASTY Right 02/22/2013   Procedure: TOTAL KNEE ARTHROPLASTY;  Surgeon: Vickey Huger, MD;  Location: [place];  Service: Orthopedics;  Laterality: Right;   Social History   Social History Narrative  . Not on file     Objective: Vital Signs: There were no vitals taken for this visit.   Physical Exam   Musculoskeletal Exam: ***  CDAI Exam: No CDAI exam completed.    Investigation: No additional findings. CBC Latest Ref Rng & Units 01/16/2017 10/20/2016 10/09/2016  WBC 4.0 - 10.5 K/uL 9.0 9.1 10.7(H)  Hemoglobin 12.0 - 15.0 g/dL 12.8 11.2(L) 11.9(L)  Hematocrit 36.0 - 46.0 % 39.2 34.8(L) 36.3  Platelets 150.0 - 400.0 K/uL 347.0 316 331   CMP Latest Ref Rng & Units 01/16/2017 11/08/2016 10/29/2016  Glucose 70 - 99 mg/dL 95 79 89  BUN 6 - 23 mg/dL 29(H) 32(H) 42(H)  Creatinine 0.40 - 1.20 mg/dL 1.59(H) 1.51(H) 1.56(H)  Sodium 135 - 145 mEq/L 138 137 141  Potassium 3.5 - 5.1 mEq/L 4.7 4.8 4.5  Chloride 96 - 112 mEq/L 101 102 103  CO2 19 - 32 mEq/L 29 28 30   Calcium 8.4 - 10.5 mg/dL 9.7 9.2 9.5  Total Protein 6.0 - 8.3 g/dL 7.7 - -  Total Bilirubin 0.2 - 1.2 mg/dL 0.5 - -  Alkaline Phos 39 - 117 U/L 82 - -  AST 0 - 37 U/L 21 - -  ALT 0 - 35 U/L 12 - -    Imaging: No results found.  Speciality Comments: No specialty comments available.    Procedures:  No procedures performed Allergies: Aspirin; Codeine; and Tizanidine hcl   Assessment / Plan:     Visit Diagnoses: Positive ANA (antinuclear antibody) - Repeat ANA negative, ENA negative, C3-C4 normal, anticardiolipin IgM positive nonspecific. She does have elevated cardiolipin antibody IgM-not on ASA  Idiopathic chronic gout of multiple sites without tophus - Allopurinol 300 mg dailyUric acid: dueCBC and CMP: 01/16/17  Primary osteoarthritis of both hands  History of total bilateral knee replacement  Neck pain  DDD (degenerative disc disease), lumbar  Primary osteoarthritis of both feet  History of peripheral  neuropathy  Pure hypercholesterolemia  Venous insufficiency (chronic) (peripheral)    Orders: No orders of the defined types were placed in this encounter.  No orders of the defined types were placed in this encounter.   Face-to-face time spent with patient was *** minutes. 50% of time was spent in counseling and coordination of care.  Follow-Up Instructions: No follow-ups on file.   Ofilia Neas, PA-C  Note - This record has been created using Dragon software.  Chart creation errors have been sought, but may not always  have been located. Such creation errors do not reflect on  the standard of medical  care.

## 2017-05-02 ENCOUNTER — Ambulatory Visit: Payer: Medicare Other | Admitting: Physician Assistant

## 2017-05-02 ENCOUNTER — Ambulatory Visit: Payer: Medicare Other | Admitting: Rheumatology

## 2017-06-11 ENCOUNTER — Other Ambulatory Visit: Payer: Self-pay | Admitting: Family Medicine

## 2017-06-11 DIAGNOSIS — Z1231 Encounter for screening mammogram for malignant neoplasm of breast: Secondary | ICD-10-CM

## 2017-06-27 NOTE — Telephone Encounter (Signed)
Disregard opened in error °

## 2017-07-01 ENCOUNTER — Ambulatory Visit
Admission: RE | Admit: 2017-07-01 | Discharge: 2017-07-01 | Disposition: A | Discharge status: Home / Self Care | Payer: Medicare Other | Source: Ambulatory Visit | Attending: Family Medicine | Admitting: Family Medicine

## 2017-07-01 DIAGNOSIS — Z1231 Encounter for screening mammogram for malignant neoplasm of breast: Secondary | ICD-10-CM

## 2017-07-04 ENCOUNTER — Other Ambulatory Visit: Payer: Self-pay | Admitting: *Deleted

## 2017-07-04 MED ORDER — FUROSEMIDE 40 MG PO TABS: 40.0000 mg | ORAL_TABLET | Freq: Every day | ORAL | 3 refills | Status: DC

## 2017-07-09 ENCOUNTER — Other Ambulatory Visit: Payer: Self-pay | Admitting: Family Medicine

## 2017-07-25 ENCOUNTER — Ambulatory Visit: Payer: Medicare Other | Admitting: Neurology

## 2017-08-01 ENCOUNTER — Other Ambulatory Visit: Payer: Self-pay

## 2017-08-01 ENCOUNTER — Ambulatory Visit (INDEPENDENT_AMBULATORY_CARE_PROVIDER_SITE_OTHER): Payer: Medicare Other | Admitting: Family Medicine

## 2017-08-01 ENCOUNTER — Encounter: Payer: Self-pay | Admitting: Family Medicine

## 2017-08-01 VITALS — BP 124/76 | HR 80 | Temp 98.0°F | Ht 65.0 in | Wt 216.4 lb

## 2017-08-01 DIAGNOSIS — E7800 Pure hypercholesterolemia, unspecified: Secondary | ICD-10-CM

## 2017-08-01 DIAGNOSIS — M1A00X0 Idiopathic chronic gout, unspecified site, without tophus (tophi): Secondary | ICD-10-CM | POA: Diagnosis not present

## 2017-08-01 DIAGNOSIS — J0100 Acute maxillary sinusitis, unspecified: Secondary | ICD-10-CM

## 2017-08-01 DIAGNOSIS — E038 Other specified hypothyroidism: Secondary | ICD-10-CM

## 2017-08-01 DIAGNOSIS — I10 Essential (primary) hypertension: Secondary | ICD-10-CM | POA: Diagnosis not present

## 2017-08-01 DIAGNOSIS — I872 Venous insufficiency (chronic) (peripheral): Secondary | ICD-10-CM

## 2017-08-01 DIAGNOSIS — Z0000 Encounter for general adult medical examination without abnormal findings: Secondary | ICD-10-CM

## 2017-08-01 LAB — CBC WITH DIFFERENTIAL/PLATELET
Basophils Absolute: 0.1 10*3/uL (ref 0.0–0.1)
Basophils Relative: 1 % (ref 0.0–3.0)
Eosinophils Absolute: 0.2 10*3/uL (ref 0.0–0.7)
Eosinophils Relative: 1.7 % (ref 0.0–5.0)
HCT: 38.7 % (ref 36.0–46.0)
Hemoglobin: 13.1 g/dL (ref 12.0–15.0)
Lymphocytes Relative: 22.8 % (ref 12.0–46.0)
Lymphs Abs: 2.1 10*3/uL (ref 0.7–4.0)
MCHC: 33.9 g/dL (ref 30.0–36.0)
MCV: 91 fl (ref 78.0–100.0)
Monocytes Absolute: 0.7 10*3/uL (ref 0.1–1.0)
Monocytes Relative: 7.6 % (ref 3.0–12.0)
Neutro Abs: 6.2 10*3/uL (ref 1.4–7.7)
Neutrophils Relative %: 66.9 % (ref 43.0–77.0)
Platelets: 357 10*3/uL (ref 150.0–400.0)
RBC: 4.26 Mil/uL (ref 3.87–5.11)
RDW: 15.2 % (ref 11.5–15.5)
WBC: 9.2 10*3/uL (ref 4.0–10.5)

## 2017-08-01 LAB — COMPREHENSIVE METABOLIC PANEL
ALT: 13 U/L (ref 0–35)
AST: 20 U/L (ref 0–37)
Albumin: 4.3 g/dL (ref 3.5–5.2)
Alkaline Phosphatase: 84 U/L (ref 39–117)
BUN: 28 mg/dL — ABNORMAL HIGH (ref 6–23)
CO2: 28 mEq/L (ref 19–32)
Calcium: 9.8 mg/dL (ref 8.4–10.5)
Chloride: 97 mEq/L (ref 96–112)
Creatinine, Ser: 1.45 mg/dL — ABNORMAL HIGH (ref 0.40–1.20)
GFR: 36.67 mL/min — ABNORMAL LOW (ref >60.00)
Glucose, Bld: 90 mg/dL (ref 70–99)
Potassium: 4 mEq/L (ref 3.5–5.1)
Sodium: 137 mEq/L (ref 135–145)
Total Bilirubin: 0.7 mg/dL (ref 0.2–1.2)
Total Protein: 7.9 g/dL (ref 6.0–8.3)

## 2017-08-01 LAB — LIPID PANEL
Cholesterol: 175 mg/dL (ref 0–200)
HDL: 65.5 mg/dL (ref >39.00)
LDL Cholesterol: 93 mg/dL (ref 0–99)
NonHDL: 109.28
Total CHOL/HDL Ratio: 3
Triglycerides: 80 mg/dL (ref 0.0–149.0)
VLDL: 16 mg/dL (ref 0.0–40.0)

## 2017-08-01 LAB — URIC ACID: Uric Acid, Serum: 6.6 mg/dL (ref 2.4–7.0)

## 2017-08-01 LAB — TSH: TSH: 3.32 u[IU]/mL (ref 0.35–4.50)

## 2017-08-01 MED ORDER — TRIAMCINOLONE ACETONIDE 0.1 % EX CREA
1.0000 | TOPICAL_CREAM | Freq: Two times a day (BID) | CUTANEOUS | 0 refills | Status: DC
Start: 2017-08-01 — End: 2017-09-15

## 2017-08-01 MED ORDER — AMOXICILLIN 875 MG PO TABS: 875.0000 mg | ORAL_TABLET | Freq: Two times a day (BID) | ORAL | 0 refills | Status: AC

## 2017-08-01 MED ORDER — ZOSTER VAC RECOMB ADJUVANTED 50 MCG/0.5ML IM SUSR: 0.5000 mL | Freq: Once | INTRAMUSCULAR | 0 refills | Status: AC

## 2017-08-01 NOTE — Progress Notes (Signed)
Please call patient: I have reviewed his/her lab results. All blood work is stable. No change in medications is needed at this time.

## 2017-08-01 NOTE — Progress Notes (Signed)
Subjective  Chief Complaint  Patient presents with  . Annual Exam    doing well   . Sinus Problem    Sinus pain and pressure x 2 days  . Hyperlipidemia  . Hypertension    HPI: Kristen Manning is a 84 y.o. female who presents to [place] at [facility] today for a Female Wellness Visit. She also has the concerns and/or needs as listed above in the chief complaint. These will be addressed in addition to the Health Maintenance Visit.   Wellness Visit: annual visit with health maintenance review and exam without Pap   HM is up to date. Due shingrix. dexa due next year. Stable osteopenia.  Independent with ADLs/ AWV up to date  Chronic disease f/u and/or acute problem visit: (deemed necessary to be done in addition to the wellness visit):  Chronic medical problems: HTN, HLD, low thyroid: all stable clinically. Due labs. Tolerating meds. No cp or sob. Active.   C/o sinus pain L>R x 3 days with thick drainage and facial pain. Has h/o sinusitis. No f/c/s or sob or cough. Last treated with amox in feb for same and did well   Redness and le edema with flaking w/o pain.   Assessment  1. Annual physical exam   2. Essential hypertension   3. Pure hypercholesterolemia   4. Other specified hypothyroidism   5. Idiopathic chronic gout without tophus, unspecified site   6. Acute non-recurrent maxillary sinusitis   7. Venous insufficiency (chronic) (peripheral)      Plan  Female Wellness Visit:  Age appropriate Health Maintenance and Prevention measures were discussed with patient. Included topics are cancer screening recommendations, ways to keep healthy (see AVS) including dietary and exercise recommendations, regular eye and dental care, use of seat belts, and avoidance of moderate alcohol use and tobacco use.   BMI: discussed patient's BMI and encouraged positive lifestyle modifications to help get to or maintain a target BMI.  HM needs and immunizations were  addressed and ordered. See below for orders. See HM and immunization section for updates.RX for shingrix  Routine labs and screening tests ordered including cmp, cbc and lipids where appropriate.  Discussed recommendations regarding Vit D and calcium supplementation (see AVS)  Chronic disease management visit and/or acute problem visit:  HTN, HLD, low thyroid: check labs. Good control. Tolerating meds. Statin intolerant.  Acute recurrent sinus infection: treat with amox and supportive care/f/u if not improving.   Chronic venous insuf derm: tac crearm as needed, consider compression stockings. On lasix  Gout: check ua and renal function on allopurinol  Follow up: Return in about 6 months (around 02/01/2018) for follow up Hypertension.  Orders Placed This Encounter  Procedures  . CBC with Differential/Platelet  . Comprehensive metabolic panel  . Lipid panel  . TSH  . Uric acid   Meds ordered this encounter  Medications  . amoxicillin (AMOXIL) 875 MG tablet    Sig: Take 1 tablet (875 mg total) by mouth 2 (two) times daily for 7 days.    Dispense:  14 tablet    Refill:  0  . triamcinolone cream (KENALOG) 0.1 %    Sig: Apply 1 application topically 2 (two) times daily. For two weeks as needed    Dispense:  45 g    Refill:  0  . Zoster Vaccine Adjuvanted [facility]) injection    Sig: Inject 0.5 mLs into the muscle once for 1 dose. Please give 2nd dose 2-6 months after first dose  Dispense:  2 each    Refill:  0      Lifestyle: Body mass index is 36.01 kg/m. Wt Readings from Last 3 Encounters:  08/01/17 216 lb 6.4 oz (98.2 kg)  03/31/17 219 lb (99.3 kg)  03/03/17 219 lb (99.3 kg)     Patient Active Problem List   Diagnosis Date Noted  . Muscle spasm 10/28/2016  . DDD (degenerative disc disease), lumbar 10/07/2016  . Primary osteoarthritis of both hands 10/07/2016  . Primary osteoarthritis of both feet 10/07/2016  . Tension-type headache, not intractable 09/16/2016    . GERD (gastroesophageal reflux disease) 09/11/2016  . ANA positive 09/02/2016  . Myalgia 06/26/2016  . Shortness of breath 03/22/2015    Shortness of breath   . History of total knee replacement, bilateral 03/20/2015  . Hypothyroidism 09/21/2014  . Cephalalgia 09/15/2014  . Gout 06/20/2014  . Lower leg edema 06/13/2014    Bilateral lower extremity edema   . Pain in both feet 06/13/2014  . Chest wall pain 04/29/2014  . Meningioma ([place]) 10/07/2013  . Chronic tension-type headache, intractable 10/07/2013  . Pain of right lower leg 08/11/2013  . Neck pain 07/28/2013  . Swelling of limb 07/07/2013    Bilateral lower extremity edema   . Headache(784.0) 07/07/2013  . Depression 06/23/2013  . Insomnia 05/26/2013  . Routine general medical examination at a health care facility 10/17/2011  . HTN (hypertension) 07/03/2011    hypertension   . Hyperlipidemia 07/03/2011    Intolerant to statins   . Venous insufficiency, peripheral 07/03/2011   Health Maintenance  Topic Date Due  . INFLUENZA VACCINE  08/07/2017  . MAMMOGRAM  07/02/2018  . TETANUS/TDAP  11/27/2022  . DEXA SCAN  Completed  . PNA vac Low Risk Adult  Completed   Immunization History  Administered Date(s) Administered  . Influenza,inj,Quad PF,6+ Mos 09/21/2014  . Influenza-Unspecified 10/29/2016  . Pneumococcal Conjugate-13 09/21/2014  . Pneumococcal Polysaccharide-23 11/08/2016  . Td 11/26/2012   We updated and reviewed the patient's past history in detail and it is documented below. Allergies: Patient is allergic to aspirin; codeine; and tizanidine hcl. Past Medical History Patient  has a past medical history of Arthritis, Bacterial infection, Bilateral lower extremity edema, Cancer ([place]), Chronic back pain, GERD (gastroesophageal reflux disease), Headache(784.0), History of migraine, Hyperlipidemia, Hypertension, Joint pain, Joint swelling, Myocardial infarction ([place]), Nocturia, Osteoarthritis, Pneumonia,  Pneumonia, PONV (postoperative nausea and vomiting), Shortness of breath, Urinary frequency, Urinary incontinence, and Urinary urgency. Past Surgical History Patient  has a past surgical history that includes Knee surgery (Bilateral, 2009 and 2011); bladder tack (1998); Tonsillectomy (as a child ? date); Oophorectomy (1998); Blepharoplasty (Bilateral); Total knee arthroplasty (Right, 02/22/2013); Total knee arthroplasty (Right, 02/22/2013); Joint replacement (Left, 10/02/2009); and Joint replacement (Right, 02-22-13). Family History: Patient family history includes Alcohol abuse in her brother and father; Arthritis in her brother; Breast cancer in her maternal aunt; Deep vein thrombosis in her son; Early death in her brother; Heart disease in her brother and mother; Hyperlipidemia in her brother; Hypertension in her brother, brother, father, and mother; Kidney disease in her mother; Kidney failure in her mother; Stroke in her father; Varicose Veins in her brother, father, mother, and son. Social History:  Patient  reports that she has never smoked. She has never used smokeless tobacco. She reports that she does not drink alcohol or use drugs.  Review of Systems: Constitutional: negative for fever or malaise Ophthalmic: negative for photophobia, double vision or loss of vision Cardiovascular: negative  for chest pain, dyspnea on exertion, or new LE swelling Respiratory: negative for SOB or persistent cough Gastrointestinal: negative for abdominal pain, change in bowel habits or melena Genitourinary: negative for dysuria or gross hematuria, no abnormal uterine bleeding or disharge Musculoskeletal: negative for new gait disturbance or muscular weakness Integumentary: negative for new or persistent rashes, no breast lumps Neurological: negative for TIA or stroke symptoms Psychiatric: negative for SI or delusions Allergic/Immunologic: negative for hives  Patient Care Team    Relationship Specialty  Notifications Start End  Midge Minium, MD PCP - General Family Medicine  07/03/11    Comment: Shirlean Kelly, MD Consulting Physician Orthopedic Surgery  08/16/13   Harriett Sine, MD Consulting Physician Dermatology  03/21/14   Jana Half, [place] Consulting Physician Podiatry  03/21/15   Cameron Sprang, MD Consulting Physician Neurology  04/03/15   Lorretta Harp, MD Consulting Physician Cardiology  09/11/16     Objective  Vitals: BP 124/76   Pulse 80   Temp 98 F (36.7 C)   Ht 5\' 5"  (1.651 m)   Wt 216 lb 6.4 oz (98.2 kg)   SpO2 96%   BMI 36.01 kg/m  General:  Well developed, well nourished, no acute distress  Psych:  Alert and orientedx3,normal mood and affect HEENT:  Normocephalic, atraumatic, non-icteric sclera, PERRL, oropharynx is clear without mass or exudate, supple neck without adenopathy, mass or thyromegaly, left sinus ttp present Cardiovascular:  Normal S1, S2, RRR without gallop, rub or murmur, nondisplaced PMI Respiratory:  Good breath sounds bilaterally, CTAB with normal respiratory effort Gastrointestinal: normal bowel sounds, soft, non-tender, no noted masses. No HSM MSK: no deformities, contusions.Bilateral lower ext edema with redness and flaking to mid shin Skin:  Warm, no rashes or suspicious lesions noted   Commons side effects, risks, benefits, and alternatives for medications and treatment plan prescribed today were discussed, and the patient expressed understanding of the given instructions. Patient is instructed to call or message via MyChart if he/she has any questions or concerns regarding our treatment plan. No barriers to understanding were identified. We discussed Red Flag symptoms and signs in detail. Patient expressed understanding regarding what to do in case of urgent or emergency type symptoms.   Medication list was reconciled, printed and provided to the patient in AVS. Patient instructions and summary information was reviewed with  the patient as documented in the AVS. This note was prepared with assistance of Dragon voice recognition software. Occasional wrong-word or sound-a-like substitutions may have occurred due to the inherent limitations of voice recognition software

## 2017-08-01 NOTE — Patient Instructions (Signed)
Please return in 6 months with Dr. Birdie Riddle for recheck.  If you have any questions or concerns, please don't hesitate to send me a message via MyChart or call the office at [phone]. Thank you for visiting with [place] today! It's our pleasure caring for you.  Please do these things to maintain good health!   Exercise at least 30-45 minutes a day,  4-5 days a week.   Eat a low-fat diet with lots of fruits and vegetables, up to 7-9 servings per day.  Drink plenty of water daily. Try to drink 8 8oz glasses per day.  Seatbelts can save your life. Always wear your seatbelt.  Place Smoke Detectors on every level of your home and check batteries every year.  Schedule an appointment with an eye doctor for an eye exam every 1-2 years  Safe sex - use condoms to protect yourself from STDs if you could be exposed to these types of infections. Use birth control if you do not want to become pregnant and are sexually active.  Avoid heavy alcohol use. If you drink, keep it to less than 2 drinks/day and not every day.  [place].  Choose someone you trust that could speak for you if you became unable to speak for yourself.  Depression is common in our stressful world.If you're feeling down or losing interest in things you normally enjoy, please come in for a visit.  If anyone is threatening or hurting you, please get help. Physical or Emotional Violence is never OK.

## 2017-08-02 ENCOUNTER — Other Ambulatory Visit: Payer: Self-pay

## 2017-08-02 ENCOUNTER — Emergency Department (HOSPITAL_BASED_OUTPATIENT_CLINIC_OR_DEPARTMENT_OTHER)
Admission: EM | Admit: 2017-08-02 | Discharge: 2017-08-02 | Disposition: A | Discharge status: Home / Self Care | Payer: Medicare Other | Attending: Emergency Medicine | Admitting: Emergency Medicine

## 2017-08-02 DIAGNOSIS — T8089XA Other complications following infusion, transfusion and therapeutic injection, initial encounter: Secondary | ICD-10-CM | POA: Diagnosis not present

## 2017-08-02 DIAGNOSIS — T887XXA Unspecified adverse effect of drug or medicament, initial encounter: Secondary | ICD-10-CM | POA: Insufficient documentation

## 2017-08-02 DIAGNOSIS — M79632 Pain in left forearm: Secondary | ICD-10-CM | POA: Diagnosis not present

## 2017-08-02 DIAGNOSIS — I10 Essential (primary) hypertension: Secondary | ICD-10-CM | POA: Insufficient documentation

## 2017-08-02 DIAGNOSIS — Z79899 Other long term (current) drug therapy: Secondary | ICD-10-CM | POA: Insufficient documentation

## 2017-08-02 DIAGNOSIS — Y658 Other specified misadventures during surgical and medical care: Secondary | ICD-10-CM | POA: Diagnosis not present

## 2017-08-02 DIAGNOSIS — R52 Pain, unspecified: Secondary | ICD-10-CM

## 2017-08-02 DIAGNOSIS — Z96653 Presence of artificial knee joint, bilateral: Secondary | ICD-10-CM | POA: Diagnosis not present

## 2017-08-02 DIAGNOSIS — T50B95A Adverse effect of other viral vaccines, initial encounter: Secondary | ICD-10-CM

## 2017-08-02 DIAGNOSIS — M79622 Pain in left upper arm: Secondary | ICD-10-CM | POA: Diagnosis present

## 2017-08-02 DIAGNOSIS — Z85828 Personal history of other malignant neoplasm of skin: Secondary | ICD-10-CM | POA: Insufficient documentation

## 2017-08-02 MED ORDER — HYDROCODONE-ACETAMINOPHEN 5-325 MG PO TABS
1.0000 | ORAL_TABLET | Freq: Once | ORAL | Status: AC
  Administered 2017-08-02: 1 via ORAL
  Filled 2017-08-02: qty 1

## 2017-08-02 MED ORDER — ONDANSETRON 8 MG PO TBDP: 8.0000 mg | ORAL_TABLET | Freq: Three times a day (TID) | ORAL | 0 refills | Status: DC | PRN

## 2017-08-02 MED ORDER — ONDANSETRON 8 MG PO TBDP
8.0000 mg | ORAL_TABLET | Freq: Once | ORAL | Status: AC
  Administered 2017-08-02: 8 mg via ORAL
  Filled 2017-08-02: qty 1

## 2017-08-02 MED ORDER — HYDROCODONE-ACETAMINOPHEN 5-325 MG PO TABS: 0.5000 | ORAL_TABLET | Freq: Four times a day (QID) | ORAL | 0 refills | Status: DC | PRN

## 2017-08-02 NOTE — ED Provider Notes (Signed)
[facility] Provider Note: Georgena Spurling, MD, FACEP  CSN: [id number] MRN: [id number] ARRIVAL: 08/02/17 at 0614 ROOM: [place]  Arm Pain (Left)   HISTORY OF PRESENT ILLNESS  08/02/17 6:32 AM Kristen Manning is a 84 y.o. female who was seen by her PCP, Dr. Jonni Sanger, yesterday for her regular checkup.  She received the Shingrix zoster vaccine in the left deltoid.  She is here with severe pain at the injection site that began earlier this morning.  The pain radiates upward to her shoulder and down to to her left hand, primarily the hyperthenar eminence.  There is no associated fever, redness or warmth.  Pain is worse with palpation or movement.  She is having nausea which she attributes to the pain.    Past Medical History:  Diagnosis Date  . Arthritis   . Bacterial infection    in lungs in Jan 2015  . Bilateral lower extremity edema   . Cancer ([place])    skin cancer, back, legs melonama  . Chronic back pain   . GERD (gastroesophageal reflux disease)    takes Omeprazole daily  . Headache(784.0)   . History of migraine    last one about 15 yrs ago  . Hyperlipidemia    takes Zetia daily  . Hypertension    takes HCTZ daily  . Joint pain   . Joint swelling   . Myocardial infarction [facility])    pt states EKG always shows infarct but no change from previous ekg;never knew anything about it  . Nocturia   . Osteoarthritis   . Pneumonia    hx of-many yrs ago  . Pneumonia   . PONV (postoperative nausea and vomiting)   . Shortness of breath    with exertion  . Urinary frequency   . Urinary incontinence   . Urinary urgency     Past Surgical History:  Procedure Laterality Date  . bladder tack  1998  . BLEPHAROPLASTY Bilateral   . JOINT REPLACEMENT Left 10/02/2009  . JOINT REPLACEMENT Right 02-22-13   Knee  . KNEE SURGERY Bilateral 2009 and 2011  . OOPHORECTOMY  1998   only one  . TONSILLECTOMY  as a child ? date  . TOTAL KNEE ARTHROPLASTY Right  02/22/2013   DR Ronnie Derby  . TOTAL KNEE ARTHROPLASTY Right 02/22/2013   Procedure: TOTAL KNEE ARTHROPLASTY;  Surgeon: Vickey Huger, MD;  Location: [facility];  Service: Orthopedics;  Laterality: Right;    Family History  Problem Relation Age of Onset  . Kidney disease Mother   . Heart disease Mother   . Hypertension Mother   . Varicose Veins Mother   . Kidney failure Mother   . Alcohol abuse Father   . Stroke Father        several   . Hypertension Father   . Varicose Veins Father   . Alcohol abuse Brother   . Hyperlipidemia Brother   . Hypertension Brother   . Early death Brother   . Varicose Veins Brother   . Heart disease Brother   . Hypertension Brother   . Arthritis Brother        Gout  . Varicose Veins Son   . Deep vein thrombosis Son   . Breast cancer Maternal Aunt     Social History   Tobacco Use  . Smoking status: Never Smoker  . Smokeless tobacco: Never Used  Substance Use Topics  . Alcohol use: No  . Drug use: No  Prior to Admission medications   Medication Sig Start Date End Date Taking? Authorizing Provider  acetaminophen (TYLENOL) 500 MG tablet Take 1 tablet (500 mg total) by mouth every 8 (eight) hours as needed for moderate pain. 09/11/16   Midge Minium, MD  allopurinol (ZYLOPRIM) 300 MG tablet Take 300 mg by mouth daily. 06/27/15   [provider]  amoxicillin (AMOXIL) 875 MG tablet Take 1 tablet (875 mg total) by mouth 2 (two) times daily for 7 days. 08/01/17 08/08/17  Leamon Arnt, MD  Calcium 600-200 MG-UNIT tablet Take 1 tablet by mouth daily.    [provider]  ezetimibe (ZETIA) 10 MG tablet Take 1 tablet (10 mg total) daily by mouth. 11/15/16   Midge Minium, MD  fenofibrate 160 MG tablet Take 1 tablet (160 mg total) by mouth daily. 01/03/17   Midge Minium, MD  fluticasone (FLONASE) 50 MCG/ACT nasal spray Place 2 sprays into both nostrils daily. 10/10/16   Brunetta Jeans, PA-C  furosemide (LASIX) 40 MG tablet Take 1  tablet (40 mg total) by mouth daily. 07/04/17   Lorretta Harp, MD  levothyroxine (SYNTHROID, LEVOTHROID) 50 MCG tablet TAKE 1 TABLET BY MOUTH ONCE DAILY 07/09/17   Midge Minium, MD  loratadine (CLARITIN) 10 MG tablet Take 1 tablet (10 mg total) by mouth daily. 01/16/17   Midge Minium, MD  Omega-3 Fatty Acids (FISH OIL) 1000 MG CAPS Take by mouth.    [provider]  omeprazole (PRILOSEC) 20 MG capsule Take 20 mg by mouth daily.    [provider]  triamcinolone cream (KENALOG) 0.1 % Apply 1 application topically 2 (two) times daily. For two weeks as needed 08/01/17   Leamon Arnt, MD    Allergies Aspirin; Codeine; and Tizanidine hcl   REVIEW OF SYSTEMS  Negative except as noted here or in the History of Present Illness.   PHYSICAL EXAMINATION  Initial Vital Signs Blood pressure (!) 169/67, pulse 69, temperature 97.6 F (36.4 C), temperature source Oral, resp. rate 18, SpO2 99 %.  Examination General: Well-developed, well-nourished female in no acute distress; appearance consistent with age of record HENT: normocephalic; atraumatic Eyes: pupils equal, round and reactive to light; extraocular muscles intact Neck: supple Heart: regular rate and rhythm Lungs: clear to auscultation bilaterally Abdomen: soft; nondistended; nontender; bowel sounds present Extremities: No deformity; tenderness of left deltoid without erythema or warmth; tenderness of left forearm and hand, notably on movement Neurologic: Awake, alert and oriented; motor function intact in all extremities and symmetric; no facial droop Skin: Warm and dry Psychiatric: Normal mood and affect   RESULTS  Summary of this visit's results, reviewed by myself:   EKG Interpretation  Date/Time:    Ventricular Rate:    PR Interval:    QRS Duration:   QT Interval:    QTC Calculation:   R Axis:     Text Interpretation:        Laboratory Studies: Results for orders placed or performed in  visit on 08/01/17 (from the past 24 hour(s))  CBC with Differential/Platelet     Status: None   Collection Time: 08/01/17  9:31 AM  Result Value Ref Range   WBC 9.2 4.0 - 10.5 K/uL   RBC 4.26 3.87 - 5.11 Mil/uL   Hemoglobin 13.1 12.0 - 15.0 g/dL   HCT 38.7 36.0 - 46.0 %   MCV 91.0 78.0 - 100.0 fl   MCHC 33.9 30.0 - 36.0 g/dL   RDW  15.2 11.5 - 15.5 %   Platelets 357.0 150.0 - 400.0 K/uL   Neutrophils Relative % 66.9 43.0 - 77.0 %   Lymphocytes Relative 22.8 12.0 - 46.0 %   Monocytes Relative 7.6 3.0 - 12.0 %   Eosinophils Relative 1.7 0.0 - 5.0 %   Basophils Relative 1.0 0.0 - 3.0 %   Neutro Abs 6.2 1.4 - 7.7 K/uL   Lymphs Abs 2.1 0.7 - 4.0 K/uL   Monocytes Absolute 0.7 0.1 - 1.0 K/uL   Eosinophils Absolute 0.2 0.0 - 0.7 K/uL   Basophils Absolute 0.1 0.0 - 0.1 K/uL  Comprehensive metabolic panel     Status: Abnormal   Collection Time: 08/01/17  9:31 AM  Result Value Ref Range   Sodium 137 135 - 145 mEq/L   Potassium 4.0 3.5 - 5.1 mEq/L   Chloride 97 96 - 112 mEq/L   CO2 28 19 - 32 mEq/L   Glucose, Bld 90 70 - 99 mg/dL   BUN 28 (H) 6 - 23 mg/dL   Creatinine, Ser 1.45 (H) 0.40 - 1.20 mg/dL   Total Bilirubin 0.7 0.2 - 1.2 mg/dL   Alkaline Phosphatase 84 39 - 117 U/L   AST 20 0 - 37 U/L   ALT 13 0 - 35 U/L   Total Protein 7.9 6.0 - 8.3 g/dL   Albumin 4.3 3.5 - 5.2 g/dL   Calcium 9.8 8.4 - 10.5 mg/dL   GFR 36.67 (L) >60.00 mL/min  Lipid panel     Status: None   Collection Time: 08/01/17  9:31 AM  Result Value Ref Range   Cholesterol 175 0 - 200 mg/dL   Triglycerides 80.0 0.0 - 149.0 mg/dL   HDL 65.50 >39.00 mg/dL   VLDL 16.0 0.0 - 40.0 mg/dL   LDL Cholesterol 93 0 - 99 mg/dL   Total CHOL/HDL Ratio 3    NonHDL 109.28   TSH     Status: None   Collection Time: 08/01/17  9:31 AM  Result Value Ref Range   TSH 3.32 0.35 - 4.50 uIU/mL  Uric acid     Status: None   Collection Time: 08/01/17  9:31 AM  Result Value Ref Range   Uric Acid, Serum 6.6 2.4 - 7.0 mg/dL   Imaging  Studies: No results found.  ED COURSE and MDM  Nursing notes and initial vitals signs, including pulse oximetry, reviewed.  Vitals:   08/02/17 0625  BP: (!) 169/67  Pulse: 69  Resp: 18  Temp: 97.6 F (36.4 C)  TempSrc: Oral  SpO2: 99%   Significant pain at the injection site is a known side effect of the Shingrix vaccine.  We will treat the patient symptomatically.  From personal experience the discomfort only lasts about 2 days.   PROCEDURES    ED DIAGNOSES     ICD-10-CM   1. Pain at injection site, initial encounter T80.89XA    R52   2. Adverse effect of zoster vaccine T50.B95A        Jatia Musa, Jenny Reichmann, MD 08/02/17 [phone]

## 2017-08-02 NOTE — ED Triage Notes (Signed)
Pt c/o left arm pain originating at the site of injection then radiating down her left arm. Pt received IM SHINGRIX vaccine in left arm yesterday. Pt A+OX4, NAD.

## 2017-08-04 ENCOUNTER — Telehealth: Payer: Self-pay | Admitting: General Practice

## 2017-08-04 NOTE — Telephone Encounter (Signed)
Called pt to advise of her lab results. She advised that she was getting ready to contact the office. She had received her Shingrix vaccination last week and ended up in the ER this weekend with severe arm pain radiating from the location of the shot. She was advised to contact the office just to give them an update as to how she is feeling. Pt states that her arm is beginning to feel better as the ER gave her pain medication.   Message is just an FYI.

## 2017-08-05 ENCOUNTER — Ambulatory Visit (INDEPENDENT_AMBULATORY_CARE_PROVIDER_SITE_OTHER): Payer: Medicare Other | Admitting: Cardiovascular Disease

## 2017-08-05 ENCOUNTER — Encounter: Payer: Self-pay | Admitting: Cardiovascular Disease

## 2017-08-05 VITALS — BP 140/80 | HR 79 | Ht 65.0 in | Wt 218.0 lb

## 2017-08-05 DIAGNOSIS — I10 Essential (primary) hypertension: Secondary | ICD-10-CM | POA: Diagnosis not present

## 2017-08-05 DIAGNOSIS — E7800 Pure hypercholesterolemia, unspecified: Secondary | ICD-10-CM

## 2017-08-05 DIAGNOSIS — I872 Venous insufficiency (chronic) (peripheral): Secondary | ICD-10-CM

## 2017-08-05 NOTE — Assessment & Plan Note (Signed)
History of chronic lower extremity edema daily.  This is not changed in severity since I saw her a year ago.  She does have venous stasis changes.  She did have a venous reflux study that was positive.  Continue leg elevation, salt restriction and diuretics.

## 2017-08-05 NOTE — Patient Instructions (Signed)
Medication Instructions:  Your physician recommends that you continue on your current medications as directed. Please refer to the Current Medication list given to you today.   Labwork: none  Testing/Procedures: none  Follow-Up: Follow up with Dr. Berry as needed.   Any Other Special Instructions Will Be Listed Below (If Applicable).     If you need a refill on your cardiac medications before your next appointment, please call your pharmacy.   

## 2017-08-05 NOTE — Progress Notes (Signed)
08/05/2017 Kristen Manning   Aug 10, 1932  [id number]  Primary Physician Midge Minium, MD Primary Cardiologist: Lorretta Harp MD Garret Reddish, [place], [place]  HPI:  Kristen Manning is a 84 y.o.  moderate to severely overweight widowed Caucasian female mother of 32, grandmother of her grandchildren referred by Dr. Birdie Riddle for cardiovascular evaluation. I last saw her in the office  07/31/2016 .Her brother [name] is also a patient here. Has a history of treated hypertension and hyperlipidemia. She has had bilateral total knee replacements and has gout She received pneumonia and flu vaccine in the fall of last year. Should she suddenly developed pneumonia and the flow. She has complained of increasing lower extremity edema and dyspnea on exertion with occasional chest pain. I doubled her Lasix to twice a day which resulted in marked improvement in her edema. Since I saw her a year ago she's remained clinically stable. She still has chronic bilateral lower extremity edema on oral diuretics. A venous reflux study apparently was positive for venous reflux.  Since I saw her a year ago she is remained stable.  She denies chest pain or shortness of breath.  She takes her diuretic once a day.  Her lower extremity edema has not changed in severity.   Current Meds  Medication Sig  . acetaminophen (TYLENOL) 500 MG tablet Take 1 tablet (500 mg total) by mouth every 8 (eight) hours as needed for moderate pain.  [name] allopurinol (ZYLOPRIM) 300 MG tablet Take 300 mg by mouth daily.  [name] amoxicillin (AMOXIL) 875 MG tablet Take 1 tablet (875 mg total) by mouth 2 (two) times daily for 7 days.  . Calcium 600-200 MG-UNIT tablet Take 1 tablet by mouth daily.  [name] ezetimibe (ZETIA) 10 MG tablet Take 1 tablet (10 mg total) daily by mouth.  . fenofibrate 160 MG tablet Take 1 tablet (160 mg total) by mouth daily.  . fluticasone (FLONASE) 50 MCG/ACT nasal spray Place 2 sprays into both nostrils daily.  .  furosemide (LASIX) 40 MG tablet Take 1 tablet (40 mg total) by mouth daily.  [name] HYDROcodone-acetaminophen (NORCO) 5-325 MG tablet Take 0.5-1 tablets by mouth every 6 (six) hours as needed (for pain; may cause constipation).  [name] levothyroxine (SYNTHROID, LEVOTHROID) 50 MCG tablet TAKE 1 TABLET BY MOUTH ONCE DAILY  . loratadine (CLARITIN) 10 MG tablet Take 1 tablet (10 mg total) by mouth daily.  . Omega-3 Fatty Acids (FISH OIL) 1000 MG CAPS Take by mouth.  [name] omeprazole (PRILOSEC) 20 MG capsule Take 20 mg by mouth daily.  . ondansetron (ZOFRAN ODT) 8 MG disintegrating tablet Take 1 tablet (8 mg total) by mouth every 8 (eight) hours as needed for nausea or vomiting.  . triamcinolone cream (KENALOG) 0.1 % Apply 1 application topically 2 (two) times daily. For two weeks as needed     Allergies  Allergen Reactions  . Aspirin Other (See Comments)    Noted caused 2 holes in retina, not sure   . Codeine Hives  . Tizanidine Hcl Other (See Comments)    Confusion    Social History   Socioeconomic History  . Marital status: Widowed    Spouse name: Not on file  . Number of children: Not on file  . Years of education: Not on file  . Highest education level: Not on file  Occupational History  . Not on file  Social Needs  . Financial resource strain: Not on file  . Food insecurity:  Worry: Not on file    Inability: Not on file  . Transportation needs:    Medical: Not on file    Non-medical: Not on file  Tobacco Use  . Smoking status: Never Smoker  . Smokeless tobacco: Never Used  Substance and Sexual Activity  . Alcohol use: No  . Drug use: No  . Sexual activity: Not on file  Lifestyle  . Physical activity:    Days per week: Not on file    Minutes per session: Not on file  . Stress: Not on file  Relationships  . Social connections:    Talks on phone: Not on file    Gets together: Not on file    Attends religious service: Not on file    Active member of club or organization: Not  on file    Attends meetings of clubs or organizations: Not on file    Relationship status: Not on file  . Intimate partner violence:    Fear of current or ex partner: Not on file    Emotionally abused: Not on file    Physically abused: Not on file    Forced sexual activity: Not on file  Other Topics Concern  . Not on file  Social History Narrative  . Not on file     Review of Systems: General: negative for chills, fever, night sweats or weight changes.  Cardiovascular: negative for chest pain, dyspnea on exertion, edema, orthopnea, palpitations, paroxysmal nocturnal dyspnea or shortness of breath Dermatological: negative for rash Respiratory: negative for cough or wheezing Urologic: negative for hematuria Abdominal: negative for nausea, vomiting, diarrhea, bright red blood per rectum, melena, or hematemesis Neurologic: negative for visual changes, syncope, or dizziness All other systems reviewed and are otherwise negative except as noted above.    Blood pressure 140/80, pulse 79, height 5\' 5"  (1.651 m), weight 218 lb (98.9 kg).  General appearance: alert and no distress Neck: no adenopathy, no carotid bruit, no JVD, supple, symmetrical, trachea midline and thyroid not enlarged, symmetric, no tenderness/mass/nodules Lungs: clear to auscultation bilaterally Heart: regular rate and rhythm, S1, S2 normal, no murmur, click, rub or gallop Extremities: 2-3+ bilateral lower extremity edema with venous stasis changes. Pulses: 2+ and symmetric Skin: Bilateral venous stasis changes. Neurologic: Alert and oriented X 3, normal strength and tone. Normal symmetric reflexes. Normal coordination and gait  EKG normal sinus rhythm at 79 without ST or T wave changes.  I personally reviewed this EKG.  ASSESSMENT AND PLAN:   HTN (hypertension) History of essential hypertension her blood pressure measured today 140/80.  She is on no antihypertensive medications.  Hyperlipidemia History of  hyperlipidemia intolerant to statins on Zetia and fenofibrate.  Venous insufficiency, peripheral History of chronic lower extremity edema daily.  This is not changed in severity since I saw her a year ago.  She does have venous stasis changes.  She did have a venous reflux study that was positive.  Continue leg elevation, salt restriction and diuretics.      Lorretta Harp MD FACP,FACC,FAHA, [facility] 08/05/2017 8:39 AM

## 2017-08-05 NOTE — Assessment & Plan Note (Signed)
History of hyperlipidemia intolerant to statins on Zetia and fenofibrate.

## 2017-08-05 NOTE — Assessment & Plan Note (Signed)
History of essential hypertension her blood pressure measured today 140/80.  She is on no antihypertensive medications.

## 2017-08-27 ENCOUNTER — Other Ambulatory Visit: Payer: Self-pay | Admitting: Family Medicine

## 2017-08-27 DIAGNOSIS — J019 Acute sinusitis, unspecified: Secondary | ICD-10-CM | POA: Diagnosis not present

## 2017-09-03 ENCOUNTER — Telehealth: Payer: Self-pay | Admitting: Emergency Medicine

## 2017-09-03 NOTE — Telephone Encounter (Signed)
Copied from [place] [id number]. Topic: Referral - Request >> Sep 03, 2017  2:26 PM Selinda Flavin B, [place] wrote: Reason for CRM: Patient calling and states that she was seen in an urgent care and was told that she needs a GI referral. Patient is requesting a GI referral at this time. CB#: [phone]

## 2017-09-05 ENCOUNTER — Encounter: Payer: Self-pay | Admitting: Gastroenterology

## 2017-09-15 ENCOUNTER — Other Ambulatory Visit: Payer: Self-pay

## 2017-09-15 ENCOUNTER — Ambulatory Visit (INDEPENDENT_AMBULATORY_CARE_PROVIDER_SITE_OTHER): Payer: Medicare Other | Admitting: Family Medicine

## 2017-09-15 ENCOUNTER — Encounter: Payer: Self-pay | Admitting: Family Medicine

## 2017-09-15 VITALS — BP 124/72 | HR 80 | Temp 98.3°F | Ht 63.39 in | Wt 212.6 lb

## 2017-09-15 DIAGNOSIS — I10 Essential (primary) hypertension: Secondary | ICD-10-CM

## 2017-09-15 DIAGNOSIS — E038 Other specified hypothyroidism: Secondary | ICD-10-CM | POA: Diagnosis not present

## 2017-09-15 DIAGNOSIS — N289 Disorder of kidney and ureter, unspecified: Secondary | ICD-10-CM

## 2017-09-15 DIAGNOSIS — E785 Hyperlipidemia, unspecified: Secondary | ICD-10-CM | POA: Diagnosis not present

## 2017-09-15 DIAGNOSIS — I890 Lymphedema, not elsewhere classified: Secondary | ICD-10-CM | POA: Diagnosis not present

## 2017-09-15 DIAGNOSIS — M25512 Pain in left shoulder: Secondary | ICD-10-CM

## 2017-09-15 MED ORDER — DICLOFENAC SODIUM 1 % TD GEL: 2.0000 g | Freq: Four times a day (QID) | TRANSDERMAL | 1 refills | Status: DC

## 2017-09-15 MED ORDER — TRIAMCINOLONE ACETONIDE 0.1 % EX CREA: 1.0000 "application " | TOPICAL_CREAM | Freq: Two times a day (BID) | CUTANEOUS | 0 refills | Status: DC

## 2017-09-15 NOTE — Progress Notes (Signed)
9/9/20199:35 AM  Kristen Manning 01-Oct-1932, 84 y.o. female [id number]  Chief Complaint  Patient presents with  . Establish Care    having bodyaches and left arm pain from shingle shot. Says pain is nagging. Has gone to Rhuematologist for the pain. Has a cream that is used on the legs, wants refill if possible    HPI:   Patient is a 84 y.o. female with past medical history significant for melanoma, OA, hypothyroidis, HTN, HLP, gout who presents today to establish care  Used to see Dr Birdie Riddle and she has moved far away so she can get into them  Last melanoma was 2 years ago, sees derm annually  Sees cards for fluid retention secondary to venous insufficiency, tried increasing lasix but did not tolerate increased urination Uses topical steroids occasionally, requesting refill Unable to do compression stockings due to knee replacement Saw vasc surg in 2017, neg CHF at that time, no CKD, no VTE Dx with lymphedema  Had shingles vaccine about 6 weeks ago, LUE Since then has had pain in deltoid  Worse with trying to lift arm overhead No swelling, redness or warmth Tingling down to her fingers, intermittent Sometimes has pain go her neck  Seen in er in July for this  Had CPE in July, labs UTD, stable crt 1.45, GFR 36, stable  Patient Care Team: Vickey Huger, MD as Consulting Physician (Orthopedic Surgery) Harriett Sine, MD as Consulting Physician (Dermatology) Jana Half, DPM as Consulting Physician (Podiatry) Delice Lesch Lezlie Octave, MD as Consulting Physician (Neurology) Lorretta Harp, MD as Consulting Physician (Cardiology)  Fall Risk  09/15/2017 08/01/2017 03/31/2017 03/03/2017 01/16/2017  Falls in the past year? No No No Yes Yes  Number falls in past yr: - - - 1 1  Injury with Fall? - - - No No  Risk for fall due to : - - - Impaired balance/gait -  Risk for fall due to: Comment - - - - -  Follow up - - - Falls evaluation completed -     Depression screen [facility] 2/9  09/15/2017 08/01/2017 03/03/2017  Decreased Interest 0 0 0  Down, Depressed, Hopeless 0 0 0  PHQ - 2 Score 0 0 0  Altered sleeping - - 0  Tired, decreased energy - - 0  Change in appetite - - 0  Feeling bad or failure about yourself  - - 0  Trouble concentrating - - 0  Moving slowly or fidgety/restless - - 0  Suicidal thoughts - - 0  PHQ-9 Score - - 0  Difficult doing work/chores - - Not difficult at all    Allergies  Allergen Reactions  . Aspirin Other (See Comments)    Noted caused 2 holes in retina, not sure   . Codeine Hives  . Tizanidine Hcl Other (See Comments)    Confusion    Prior to Admission medications   Medication Sig Start Date End Date Taking? Authorizing Provider  oxyCODONE (OXY IR/ROXICODONE) 5 MG immediate release tablet  03/09/13  Yes [provider]  acetaminophen (TYLENOL) 500 MG tablet Take 1 tablet (500 mg total) by mouth every 8 (eight) hours as needed for moderate pain. 09/11/16   Midge Minium, MD  allopurinol (ZYLOPRIM) 300 MG tablet Take 300 mg by mouth daily. 06/27/15   [provider]  Calcium 600-200 MG-UNIT tablet Take 1 tablet by mouth daily.    [provider]  ezetimibe (ZETIA) 10 MG tablet TAKE 1 TABLET BY MOUTH ONCE DAILY  08/28/17   Midge Minium, MD  fenofibrate 160 MG tablet Take 1 tablet (160 mg total) by mouth daily. 01/03/17   Midge Minium, MD  fluticasone (FLONASE) 50 MCG/ACT nasal spray Place 2 sprays into both nostrils daily. 10/10/16   Brunetta Jeans, PA-C  furosemide (LASIX) 40 MG tablet Take 1 tablet (40 mg total) by mouth daily. 07/04/17   Lorretta Harp, MD  HYDROcodone-acetaminophen (NORCO) 5-325 MG tablet Take 0.5-1 tablets by mouth every 6 (six) hours as needed (for pain; may cause constipation). Patient not taking: Reported on 09/15/2017 08/02/17   Molpus, John, MD  levothyroxine (SYNTHROID, LEVOTHROID) 50 MCG tablet TAKE 1 TABLET BY MOUTH ONCE DAILY 07/09/17   Midge Minium, MD    loratadine (CLARITIN) 10 MG tablet Take 1 tablet (10 mg total) by mouth daily. 01/16/17   Midge Minium, MD  Omega-3 Fatty Acids (FISH OIL) 1000 MG CAPS Take by mouth.    [provider]  omeprazole (PRILOSEC) 20 MG capsule Take 20 mg by mouth daily.    [provider]  ondansetron (ZOFRAN ODT) 8 MG disintegrating tablet Take 1 tablet (8 mg total) by mouth every 8 (eight) hours as needed for nausea or vomiting. Patient not taking: Reported on 09/15/2017 08/02/17   Molpus, John, MD  triamcinolone cream (KENALOG) 0.1 % Apply 1 application topically 2 (two) times daily. For two weeks as needed 08/01/17   Leamon Arnt, MD    Past Medical History:  Diagnosis Date  . Arthritis   . Bacterial infection    in lungs in Jan 2015  . Bilateral lower extremity edema   . Cancer ([place])    skin cancer, back, legs melonama  . Chronic back pain   . GERD (gastroesophageal reflux disease)    takes Omeprazole daily  . Headache(784.0)   . History of migraine    last one about 15 yrs ago  . Hyperlipidemia    takes Zetia daily  . Hypertension    takes HCTZ daily  . Joint pain   . Joint swelling   . Myocardial infarction [facility])    pt states EKG always shows infarct but no change from previous ekg;never knew anything about it  . Nocturia   . Osteoarthritis   . Pneumonia    hx of-many yrs ago  . Pneumonia   . PONV (postoperative nausea and vomiting)   . Shortness of breath    with exertion  . Urinary frequency   . Urinary incontinence   . Urinary urgency     Past Surgical History:  Procedure Laterality Date  . bladder tack  1998  . BLEPHAROPLASTY Bilateral   . JOINT REPLACEMENT Left 10/02/2009  . JOINT REPLACEMENT Right 02-22-13   Knee  . KNEE SURGERY Bilateral 2009 and 2011  . OOPHORECTOMY  1998   only one  . TONSILLECTOMY  as a child ? date  . TOTAL KNEE ARTHROPLASTY Right 02/22/2013   DR Ronnie Derby  . TOTAL KNEE ARTHROPLASTY Right 02/22/2013   Procedure: TOTAL KNEE  ARTHROPLASTY;  Surgeon: Vickey Huger, MD;  Location: [facility];  Service: Orthopedics;  Laterality: Right;    Social History   Tobacco Use  . Smoking status: Never Smoker  . Smokeless tobacco: Never Used  Substance Use Topics  . Alcohol use: No    Family History  Problem Relation Age of Onset  . Kidney disease Mother   . Heart disease Mother   . Hypertension Mother   . Varicose  Veins Mother   . Kidney failure Mother   . Alcohol abuse Father   . Stroke Father        several   . Hypertension Father   . Varicose Veins Father   . Alcohol abuse Brother   . Hyperlipidemia Brother   . Hypertension Brother   . Early death Brother   . Varicose Veins Brother   . Heart disease Brother   . Hypertension Brother   . Arthritis Brother        Gout  . Varicose Veins Son   . Deep vein thrombosis Son   . Breast cancer Maternal Aunt     Review of Systems  Constitutional: Negative for chills and fever.  Respiratory: Negative for cough and shortness of breath.   Cardiovascular: Negative for chest pain, palpitations and leg swelling.  Gastrointestinal: Positive for heartburn. Negative for abdominal pain, nausea and vomiting.  Musculoskeletal: Positive for joint pain, myalgias and neck pain. Negative for falls.  Neurological: Positive for tingling and focal weakness.  All other systems reviewed and are negative.    OBJECTIVE:  Blood pressure (!) 144/76, pulse 80, temperature 98.3 F (36.8 C), temperature source Oral, height 5' 3.39" (1.61 m), weight 212 lb 9.6 oz (96.4 kg), SpO2 96 %. Body mass index is 37.2 kg/m.   Physical Exam  Constitutional: She is oriented to person, place, and time. She appears well-developed and well-nourished.  HENT:  Head: Normocephalic and atraumatic.  Mouth/Throat: Oropharynx is clear and moist. No oropharyngeal exudate.  Eyes: Pupils are equal, round, and reactive to light. Conjunctivae and EOM are normal. No scleral icterus.  Neck: Neck supple.    Cardiovascular: Normal rate, regular rhythm and normal heart sounds. Exam reveals no gallop and no friction rub.  No murmur heard. Pulmonary/Chest: Effort normal and breath sounds normal. She has no wheezes. She has no rales.  Musculoskeletal: She exhibits no edema.       Left shoulder: She exhibits decreased range of motion (in all planes) and tenderness (over deltoid). She exhibits no bony tenderness and no swelling.  Neurological: She is alert and oriented to person, place, and time.  Skin: Skin is warm and dry.  Psychiatric: She has a normal mood and affect.  Nursing note and vitals reviewed.   ASSESSMENT and PLAN  1. Lymphedema of both lower extremities Referring to PT for compression therapy - Ambulatory referral to Physical Therapy  2. Essential hypertension Acceptable. Cont current regime  3. Renal insufficiency Stable, avoid nephrotoxic meds 4. Other specified hypothyroidism Controlled. Continue current regime.   5. Hyperlipidemia, unspecified hyperlipidemia type Controlled. Continue current regime.   6. Left shoulder pain, unspecified chronicity Exam suggestive of frozen shoulder. Diclofenac gel as CKD and handout for home exercises  Other orders - triamcinolone cream (KENALOG) 0.1 %; Apply 1 application topically 2 (two) times daily. For two weeks as needed - diclofenac sodium (VOLTAREN) 1 % GEL; Apply 2 g topically 4 (four) times daily.  Return in about 2 months (around 11/15/2017) for followup on lymphedema and shoulder.    Rutherford Guys, MD Primary [facility] at [facility] [place], [place] [address] Ph.  [phone] Fax [phone]

## 2017-09-15 NOTE — Patient Instructions (Signed)
° ° ° °  If you have lab work done today you will be contacted with your lab results within the next 2 weeks.  If you have not heard from us then please contact us. The fastest way to get your results is to register for My Chart. ° ° °IF you received an x-ray today, you will receive an invoice from [facility]. Please contact [facility] at [phone] with questions or concerns regarding your invoice.  ° °IF you received labwork today, you will receive an invoice from [facility]. Please contact [facility] at [phone] with questions or concerns regarding your invoice.  ° °Our billing staff will not be able to assist you with questions regarding bills from these companies. ° °You will be contacted with the lab results as soon as they are available. The fastest way to get your results is to activate your My Chart account. Instructions are located on the last page of this paperwork. If you have not heard from us regarding the results in 2 weeks, please contact this office. °  ° ° ° °

## 2017-09-18 DIAGNOSIS — L821 Other seborrheic keratosis: Secondary | ICD-10-CM | POA: Diagnosis not present

## 2017-09-18 DIAGNOSIS — C44319 Basal cell carcinoma of skin of other parts of face: Secondary | ICD-10-CM | POA: Diagnosis not present

## 2017-09-23 ENCOUNTER — Other Ambulatory Visit: Payer: Self-pay | Admitting: Family Medicine

## 2017-09-24 ENCOUNTER — Encounter: Payer: Self-pay | Admitting: Neurology

## 2017-09-24 ENCOUNTER — Ambulatory Visit (INDEPENDENT_AMBULATORY_CARE_PROVIDER_SITE_OTHER): Payer: Medicare Other | Admitting: Neurology

## 2017-09-24 ENCOUNTER — Other Ambulatory Visit: Payer: Self-pay

## 2017-09-24 ENCOUNTER — Telehealth: Payer: Self-pay | Admitting: Family Medicine

## 2017-09-24 ENCOUNTER — Ambulatory Visit: Payer: Medicare Other | Admitting: Physical Therapy

## 2017-09-24 VITALS — BP 142/84 | HR 75 | Ht 63.0 in | Wt 216.0 lb

## 2017-09-24 DIAGNOSIS — M7502 Adhesive capsulitis of left shoulder: Secondary | ICD-10-CM

## 2017-09-24 DIAGNOSIS — M25512 Pain in left shoulder: Principal | ICD-10-CM

## 2017-09-24 DIAGNOSIS — G629 Polyneuropathy, unspecified: Secondary | ICD-10-CM

## 2017-09-24 DIAGNOSIS — G8929 Other chronic pain: Secondary | ICD-10-CM

## 2017-09-24 MED ORDER — NORTRIPTYLINE HCL 10 MG PO CAPS: 10.0000 mg | ORAL_CAPSULE | Freq: Every day | ORAL | 11 refills | Status: DC

## 2017-09-24 NOTE — Progress Notes (Signed)
[facility]   NEUROLOGY FOLLOW UP OFFICE NOTE  Kristen Manning [id number]  DOB: 1932/08/13  HISTORY OF PRESENT ILLNESS: I had the pleasure of seeing Kristen Manning in follow-up in the neurology clinic on 09/24/2017.  The patient was last seen 6 months ago for tension-type headaches. She has been doing very well with no further similar headaches since her last visit. She has some sinus headaches. She had been complaining of bilateral flank pain and reports being prescribed gabapentin by a different neurologist because she was told she had "neuropathy everywhere." It appeared to help the pain but she had side effects of "thinking of death all the time." She was given samples for Lyrica but did not tolerate it. She presents today with continued report of bilateral flank pain, she also reports swelling and "puffing out" of her abdomen even if she does not eat much. She points to pain in her abdomen as well, and reports feeling mucus coming up and gagging her. One time she had difficulty swallowing. She is reporting hoarseness and voice changes. She will be seeing GI. She had a shingles vaccine 6 weeks ago and has had a frozen shoulder with terrible pain since then.   HPI: This is a pleasant 84 yo RH woman with a history of hypertension, hyperlipidemia, melanoma, who presented with headaches that started after knee surgery last February 2015. She had a difficult time during the admission, with no recollection of her hospital stay. She asked for hospital records which reported walking several steps, which she does not remember. She recalls waking up in pain, and "waking up" when she was to be transferred to the nursing home. She did not have a good experience at the SNF, and feels she had headaches at that time but was taking pain medications for her knee. She started to notice the daily headaches once she got home in March. She reports headaches are constant over the left temporal region, usually a 2 or 3/10, but  increasing in severity up to a 5 or 6/10. Headaches would radiate over the periorbital, frontal, and occipital regions, described as a throbbing pain that would cause some dizziness and nausea when more severe. She has a more intense headache today. She has noticed blurred vision in both eyes. She takes Advil 2-3 times a week when pain is more severe, but it only helps calm the pain down. She denies having any headache-free days in the past 4 months. She reports that she had trouble sleeping once she got home, bothered by the memories of other patients at the [facility]. She was prescribed Zoloft by her PCP but did not take it due to listed side effects on the package. She reports that sleep is a little better now, she usually gets 6 hours of sleep but wakes up several times to urinate due to the HCTZ she takes at bedtime. She rarely takes her brother's Ativan to sleep (1-2/month per patient). She reports a history of migraines with her period when younger, but has been headache-free for 15 years until last March. She denies any family history of migraines.   Diagnostic Data: I personally reviewed her MRI brain with and without contrast with the patient, there is a moderate amount of chronic microvascular disease in the bilateral subcortical regions. There is a small 1.8 x 1.1 x 1.4 cm meningioma in the left temporal region with minimal mass effect, no edema. There is note of a prominent left PCA infundibulum extending off the left ICA versus small  aneurysm, incompletely evaluated on this exam.  MRI brain 09/13/14 unchanged.  PAST MEDICAL HISTORY: Past Medical History:  Diagnosis Date  . Arthritis   . Bacterial infection    in lungs in Jan 2015  . Bilateral lower extremity edema   . Cancer ([place])    skin cancer, back, legs melonama, sees Dr Lisabeth Pick, derm  . Chronic back pain   . GERD (gastroesophageal reflux disease)    takes Omeprazole daily  . Headache(784.0)   . History of migraine    last one about 15  yrs ago  . Hyperlipidemia    takes Zetia daily  . Hypertension    takes HCTZ daily  . Joint pain   . Joint swelling   . Myocardial infarction [facility])    pt states EKG always shows infarct but no change from previous ekg;never knew anything about it  . Nocturia   . Osteoarthritis   . Pneumonia    hx of-many yrs ago  . Pneumonia   . PONV (postoperative nausea and vomiting)   . Shortness of breath    with exertion  . Urinary frequency   . Urinary incontinence   . Urinary urgency     MEDICATIONS:  Outpatient Encounter Medications as of 09/24/2017  Medication Sig  . acetaminophen (TYLENOL) 500 MG tablet Take 1 tablet (500 mg total) by mouth every 8 (eight) hours as needed for moderate pain.  [name] allopurinol (ZYLOPRIM) 300 MG tablet Take 300 mg by mouth daily.  . Calcium 600-200 MG-UNIT tablet Take 1 tablet by mouth daily.  . diclofenac sodium (VOLTAREN) 1 % GEL Apply 2 g topically 4 (four) times daily.  [name] ezetimibe (ZETIA) 10 MG tablet TAKE 1 TABLET BY MOUTH ONCE DAILY  . fenofibrate 160 MG tablet TAKE 1 TABLET BY MOUTH ONCE DAILY  . fluticasone (FLONASE) 50 MCG/ACT nasal spray Place 2 sprays into both nostrils daily.  . furosemide (LASIX) 40 MG tablet Take 1 tablet (40 mg total) by mouth daily.  [name] levothyroxine (SYNTHROID, LEVOTHROID) 50 MCG tablet TAKE 1 TABLET BY MOUTH ONCE DAILY  . loratadine (CLARITIN) 10 MG tablet Take 1 tablet (10 mg total) by mouth daily.  . Omega-3 Fatty Acids (FISH OIL) 1000 MG CAPS Take by mouth.  [name] omeprazole (PRILOSEC) 20 MG capsule Take 20 mg by mouth daily.  [name] triamcinolone cream (KENALOG) 0.1 % Apply 1 application topically 2 (two) times daily. For two weeks as needed  .     No facility-administered encounter medications on file as of 09/24/2017.     ALLERGIES: Allergies  Allergen Reactions  . Aspirin Other (See Comments)    Noted caused 2 holes in retina, not sure   . Codeine Hives  . Tizanidine Hcl Other (See Comments)    Confusion     FAMILY HISTORY: Family History  Problem Relation Age of Onset  . Kidney disease Mother   . Heart disease Mother   . Hypertension Mother   . Varicose Veins Mother   . Kidney failure Mother   . Alcohol abuse Father   . Stroke Father        several   . Hypertension Father   . Varicose Veins Father   . Alcohol abuse Brother   . Hyperlipidemia Brother   . Hypertension Brother   . Early death Brother   . Varicose Veins Brother   . Heart disease Brother   . Hypertension Brother   . Arthritis Brother        Gout  .  Varicose Veins Son   . Deep vein thrombosis Son   . Breast cancer Maternal Aunt     SOCIAL HISTORY: Social History   Socioeconomic History  . Marital status: Widowed    Spouse name: Not on file  . Number of children: Not on file  . Years of education: Not on file  . Highest education level: Not on file  Occupational History  . Not on file  Social Needs  . Financial resource strain: Not on file  . Food insecurity:    Worry: Not on file    Inability: Not on file  . Transportation needs:    Medical: Not on file    Non-medical: Not on file  Tobacco Use  . Smoking status: Never Smoker  . Smokeless tobacco: Never Used  Substance and Sexual Activity  . Alcohol use: No  . Drug use: No  . Sexual activity: Not on file  Lifestyle  . Physical activity:    Days per week: Not on file    Minutes per session: Not on file  . Stress: Not on file  Relationships  . Social connections:    Talks on phone: Not on file    Gets together: Not on file    Attends religious service: Not on file    Active member of club or organization: Not on file    Attends meetings of clubs or organizations: Not on file    Relationship status: Not on file  . Intimate partner violence:    Fear of current or ex partner: Not on file    Emotionally abused: Not on file    Physically abused: Not on file    Forced sexual activity: Not on file  Other Topics Concern  . Not on file   Social History Narrative  . Not on file    REVIEW OF SYSTEMS: Constitutional: No fevers, chills, or sweats, no generalized fatigue, change in appetite Eyes: No visual changes, double vision, eye pain Ear, nose and throat: No hearing loss, ear pain, nasal congestion, sore throat Cardiovascular: No chest pain, palpitations Respiratory:  No shortness of breath at rest or with exertion, wheezes GastrointestinaI: No nausea, vomiting, diarrhea, abdominal pain, fecal incontinence Genitourinary:  No dysuria, urinary retention or frequency Musculoskeletal:  No neck pain, back pain Integumentary: No rash, pruritus, skin lesions Neurological: as above Psychiatric: No depression, insomnia, anxiety Endocrine: No palpitations, fatigue, diaphoresis, mood swings, change in appetite, change in weight, increased thirst Hematologic/Lymphatic:  No anemia, purpura, petechiae. Allergic/Immunologic: no itchy/runny eyes, nasal congestion,no recent allergic reactions, rashes  PHYSICAL EXAM: Vitals:   09/24/17 0842  BP: (!) 142/84  Pulse: 75  SpO2: 98%   General: No acute distress Head:  Normocephalic/atraumatic Neck: supple, no paraspinal tenderness, full range of motion Heart:  Regular rate and rhythm Lungs:  Clear to auscultation bilaterally Back: No paraspinal tenderness Skin/Extremities: No rash, +bilateral calf edema  Neurological Exam: alert and oriented to person, place, and time. No aphasia or dysarthria. Fund of knowledge is appropriate.  Recent and remote memory are intact.  Attention and concentration are normal.    Able to name objects and repeat phrases. Cranial nerves: Pupils equal, round, reactive to light.Extraocular movements intact with no nystagmus. Visual fields full. Facial sensation intact. No facial asymmetry. Tongue, uvula, palate midline.  Motor: Bulk and tone normal, muscle strength 5/5 throughout with no pronator drift.  Sensation decreased to pin and cold to ankles bilaterally.   No extinction to double simultaneous stimulation.  Deep tendon  reflexes +1 throughout, toes downgoing.  Finger to nose testing intact.  Gait slow and cautious due to knee pain (similar to prior). Romberg negative.  IMPRESSION: This is a pleasant 84 yo RH woman with a history of hypertension, hyperlipidemia, melanoma, who presented with new onset daily headaches in March 2015. MRI brain had shown an incidental finding of a small meningioma over the left temporal region, stable in size with repeat imaging in 2016. The headaches she initially presented with have resolved and have not recurred. She was reporting pain over the bilateral flank regions that seemed to improve with gabapentin, but she had side effects. Lyrica caused side effects. She is interested in trying another medication for neuropathic pain, start low dose nortriptyline 10mg  qhs, side effects discussed. She will call our office for an update in 2 months, we may uptitrate as tolerated. She has a visit with GI for abdominal pain, swelling, and GERD. She will follow-up in 6 months and knows to call for any changes.   Thank you for allowing me to participate in her care.  Please do not hesitate to call for any questions or concerns.  The duration of this appointment visit was 26 minutes of face-to-face time with the patient.  Greater than 50% of this time was spent in counseling, explanation of diagnosis, planning of further management, and coordination of care.   Ellouise Newer, M.D.   CC: Dr. Pamella Pert

## 2017-09-24 NOTE — Telephone Encounter (Signed)
Copied from [place] [id number]. Topic: Inquiry >> Sep 24, 2017  9:58 AM Oliver Pila B wrote: Reason for CRM: [facility] called to speak about the referral; they noticed that the care is for lymphedema but they do not offer care that; pt was contacted and the pt thought it was for the frozen shoulder; if pcp can change referral to frozen shoulder treatment then contact them @ [phone]

## 2017-09-24 NOTE — Patient Instructions (Addendum)
1. Start nortriptyline 10mg  every night. Call our office for an update in 2 months, depending on response, we can increase dose further  2.. Proceed with GI evaluation   3. Follow-up in 6 months or so, call for any changes

## 2017-09-29 NOTE — Telephone Encounter (Signed)
Dr. Pamella Pert, pls read message attached from [facility]. Pt thought the referral that was made for Lymphedema, was suppose to be a referral for the frozen shoulder. Pls advise. Thank you.

## 2017-09-29 NOTE — Telephone Encounter (Signed)
New referral made

## 2017-10-07 ENCOUNTER — Telehealth: Payer: Self-pay | Admitting: Neurology

## 2017-10-07 NOTE — Telephone Encounter (Signed)
EMG/NCV report received, EMG done 10/03/2016:  Abnormal due to 1. Moderate sensorimotor polyneuropathy with mixed demyelinating and axonal features. Differential diagnosis include but not limited to glucose intolerance, paraproteinemia, vitamin deficiency, toxic exposures, endocrinopathy 2. Moderate bilateral median neuropathy at the wrist consistent with CTS, L>R 3. Multilevel lumbosacral radiculopathy, acute on chronic 4. Multilevel cervical radiculopathy, acute on chronic

## 2017-10-13 ENCOUNTER — Ambulatory Visit: Payer: Medicare Other | Admitting: Physical Therapy

## 2017-10-21 ENCOUNTER — Ambulatory Visit (INDEPENDENT_AMBULATORY_CARE_PROVIDER_SITE_OTHER): Payer: Medicare Other | Admitting: Gastroenterology

## 2017-10-21 ENCOUNTER — Encounter: Payer: Self-pay | Admitting: Gastroenterology

## 2017-10-21 VITALS — BP 112/84 | HR 88 | Ht 63.0 in | Wt 209.6 lb

## 2017-10-21 DIAGNOSIS — R0789 Other chest pain: Secondary | ICD-10-CM

## 2017-10-21 NOTE — Progress Notes (Signed)
[facility] Consult Note:  History: Kristen Manning 10/21/2017  Referring physician: Rutherford Guys, MD  Reason for consult/chief complaint: Abdominal Pain (Bilateral pain that radiates to back; ) and Sore Throat (with lots of mucous)   Subjective  HPI:  This is a very pleasant 84 year old woman referred by a local urgent care [facility] [place]) before she established with a new primary care provider recently, and was sent to see [place] for bilateral flank/upper abdominal pain.  She reports over a year of dull and sharp bilateral anterolateral chest wall pain that is nearly always there at a low level, then has episodes of more intense pain.  It does not seem to have any clear triggers or relieving factors such as position, time of day, and there are certainly no associated digestive symptoms such as nausea, vomiting, dysphagia, early satiety or weight loss.  She also describes chronic "sore throat" mucus production.  She rarely gets heartburn on chronic PPI. Poetry has been frustrated by this condition, and feels that she is seeing specialists including rheumatology and neurology without much help.  A recent neurology office note seems to indicate a previous neurologic evaluation and a trial of Neurontin that caused side effects.  She denies change in bowel habits or rectal bleeding.  She does not recall ever having had prior GI evaluation.  ROS:  Review of Systems  Constitutional: Positive for fatigue. Negative for appetite change and unexpected weight change.  HENT: Negative for mouth sores and voice change.   Eyes: Negative for pain and redness.  Respiratory: Negative for cough and shortness of breath.   Cardiovascular: Positive for leg swelling. Negative for chest pain and palpitations.  Genitourinary: Negative for dysuria and hematuria.  Musculoskeletal: Positive for arthralgias, back pain and myalgias.  Skin: Negative for pallor and rash.  Neurological:  Negative for weakness and headaches.  Hematological: Negative for adenopathy.     Past Medical History: Past Medical History:  Diagnosis Date  . Arthritis   . Bacterial infection    in lungs in Jan 2015  . Bilateral lower extremity edema   . Cancer ([place])    skin cancer, back, legs melonama, sees Dr Lisabeth Pick, derm  . Chronic back pain   . GERD (gastroesophageal reflux disease)    takes Omeprazole daily  . Headache(784.0)   . History of migraine    last one about 15 yrs ago  . Hyperlipidemia    takes Zetia daily  . Hypertension    takes HCTZ daily  . Joint pain   . Joint swelling   . Myocardial infarction [facility])    pt states EKG always shows infarct but no change from previous ekg;never knew anything about it  . Nocturia   . Osteoarthritis   . Pneumonia    hx of-many yrs ago  . Pneumonia   . PONV (postoperative nausea and vomiting)   . Shortness of breath    with exertion  . Urinary frequency   . Urinary incontinence   . Urinary urgency      Past Surgical History: Past Surgical History:  Procedure Laterality Date  . bladder tack  1998  . BLEPHAROPLASTY Bilateral   . JOINT REPLACEMENT Left 10/02/2009  . JOINT REPLACEMENT Right 02-22-13   Knee  . KNEE SURGERY Bilateral 2009 and 2011  . OOPHORECTOMY  1998   only one  . TONSILLECTOMY  as a child ? date  . TOTAL KNEE ARTHROPLASTY Right 02/22/2013   DR Ronnie Derby  . TOTAL  KNEE ARTHROPLASTY Right 02/22/2013   Procedure: TOTAL KNEE ARTHROPLASTY;  Surgeon: Vickey Huger, MD;  Location: [facility];  Service: Orthopedics;  Laterality: Right;     Family History: Family History  Problem Relation Age of Onset  . Kidney disease Mother   . Heart disease Mother   . Hypertension Mother   . Varicose Veins Mother   . Kidney failure Mother   . Alcohol abuse Father   . Stroke Father        several   . Hypertension Father   . Varicose Veins Father   . Alcohol abuse Brother   . Hyperlipidemia Brother   . Hypertension Brother   .  Early death Brother   . Varicose Veins Brother   . Heart disease Brother   . Hypertension Brother   . Arthritis Brother        Gout  . Varicose Veins Son   . Deep vein thrombosis Son   . Breast cancer Maternal Aunt     Social History: Social History   Socioeconomic History  . Marital status: Widowed    Spouse name: Not on file  . Number of children: 4  . Years of education: Not on file  . Highest education level: Not on file  Occupational History  . Not on file  Social Needs  . Financial resource strain: Not on file  . Food insecurity:    Worry: Not on file    Inability: Not on file  . Transportation needs:    Medical: Not on file    Non-medical: Not on file  Tobacco Use  . Smoking status: Never Smoker  . Smokeless tobacco: Never Used  Substance and Sexual Activity  . Alcohol use: No  . Drug use: No  . Sexual activity: Not on file  Lifestyle  . Physical activity:    Days per week: Not on file    Minutes per session: Not on file  . Stress: Not on file  Relationships  . Social connections:    Talks on phone: Not on file    Gets together: Not on file    Attends religious service: Not on file    Active member of club or organization: Not on file    Attends meetings of clubs or organizations: Not on file    Relationship status: Not on file  Other Topics Concern  . Not on file  Social History Narrative  . Not on file    Allergies: Allergies  Allergen Reactions  . Aspirin Other (See Comments)    Noted caused 2 holes in retina, not sure   . Codeine Hives  . Tizanidine Hcl Other (See Comments)    Confusion    Outpatient Meds: Current Outpatient Medications  Medication Sig Dispense Refill  . acetaminophen (TYLENOL) 500 MG tablet Take 1 tablet (500 mg total) by mouth every 8 (eight) hours as needed for moderate pain. 90 tablet 1  . allopurinol (ZYLOPRIM) 300 MG tablet Take 300 mg by mouth daily.  0  . Calcium 600-200 MG-UNIT tablet Take 1 tablet by mouth  daily.    [name] co-enzyme Q-10 30 MG capsule Take 30 mg by mouth daily.    . diclofenac sodium (VOLTAREN) 1 % GEL Apply 2 g topically 4 (four) times daily. 100 g 1  . ezetimibe (ZETIA) 10 MG tablet TAKE 1 TABLET BY MOUTH ONCE DAILY 90 tablet 0  . fenofibrate 160 MG tablet TAKE 1 TABLET BY MOUTH ONCE DAILY 90 tablet 3  .  fluticasone (FLONASE) 50 MCG/ACT nasal spray Place 2 sprays into both nostrils daily. 16 g 6  . furosemide (LASIX) 40 MG tablet Take 1 tablet (40 mg total) by mouth daily. 90 tablet 3  . levothyroxine (SYNTHROID, LEVOTHROID) 50 MCG tablet TAKE 1 TABLET BY MOUTH ONCE DAILY 210 tablet 0  . loratadine (CLARITIN) 10 MG tablet Take 1 tablet (10 mg total) by mouth daily.    . NON FORMULARY Take 1 tablet by mouth daily.    . Omega-3 Fatty Acids (FISH OIL) 1000 MG CAPS Take by mouth.    [name] omeprazole (PRILOSEC) 20 MG capsule Take 20 mg by mouth daily.    [name] triamcinolone cream (KENALOG) 0.1 % Apply 1 application topically 2 (two) times daily. For two weeks as needed 45 g 0   No current facility-administered medications for this visit.       ___________________________________________________________________ Objective   Exam:  BP 112/84   Pulse 88   Ht 5\' 3"  (1.6 m)   Wt 209 lb 9.6 oz (95.1 kg)   BMI 37.13 kg/m    General: this is a(n) well-appearing elderly woman, antalgic gait, gets on exam table with assistance.  Eyes: sclera anicteric, no redness  ENT: oral mucosa moist without lesions, no cervical or supraclavicular lymphadenopathy, good dentition  CV: RRR without murmur, S1/S2, no JVD, brawny bilateral lower leg edema.  Bilateral anterolateral chest wall tenderness as well as her thoracic spinous process and paraspinous tenderness  Resp: clear to auscultation bilaterally, normal RR and effort noted  GI: soft, no tenderness, with active bowel sounds. No guarding or palpable organomegaly noted.  Skin; warm and dry, no rash or jaundice noted  Neuro: awake, alert and  oriented x 3. Normal gross motor function and fluent speech  Labs:  CBC Latest Ref Rng & Units 08/01/2017 01/16/2017 10/20/2016  WBC 4.0 - 10.5 K/uL 9.2 9.0 9.1  Hemoglobin 12.0 - 15.0 g/dL 13.1 12.8 11.2(L)  Hematocrit 36.0 - 46.0 % 38.7 39.2 34.8(L)  Platelets 150.0 - 400.0 K/uL 357.0 347.0 316   CMP Latest Ref Rng & Units 08/01/2017 01/16/2017 11/08/2016  Glucose 70 - 99 mg/dL 90 95 79  BUN 6 - 23 mg/dL 28(H) 29(H) 32(H)  Creatinine 0.40 - 1.20 mg/dL 1.45(H) 1.59(H) 1.51(H)  Sodium 135 - 145 mEq/L 137 138 137  Potassium 3.5 - 5.1 mEq/L 4.0 4.7 4.8  Chloride 96 - 112 mEq/L 97 101 102  CO2 19 - 32 mEq/L 28 29 28   Calcium 8.4 - 10.5 mg/dL 9.8 9.7 9.2  Total Protein 6.0 - 8.3 g/dL 7.9 7.7 -  Total Bilirubin 0.2 - 1.2 mg/dL 0.7 0.5 -  Alkaline Phos 39 - 117 U/L 84 82 -  AST 0 - 37 U/L 20 21 -  ALT 0 - 35 U/L 13 12 -     Radiologic Studies:  No back imaging found file  Assessment: Encounter Diagnosis  Name Primary?  . Chest wall pain Yes    This patient has musculoskeletal pain.  Plan:  We had a discussion about this.  I will forward my note to her new primary care provider to consider further imaging physical therapy, and/or medication. She does not appear to need any GI work-up at present and will see me as needed.  Thank you for the courtesy of this consult.  Please call me with any questions or concerns.  Nelida Meuse III  CC: Rutherford Guys, MD

## 2017-10-21 NOTE — Patient Instructions (Signed)
If you are age 82 or older, your body mass index should be between 23-30. Your Body mass index is 37.13 kg/m. If this is out of the aforementioned range listed, please consider follow up with your Primary Care Provider.  If you are age 7 or younger, your body mass index should be between 19-25. Your Body mass index is 37.13 kg/m. If this is out of the aformentioned range listed, please consider follow up with your Primary Care Provider.   Follow up as needed.   It was a pleasure to see you today!  Dr. Loletha Carrow

## 2017-10-23 DIAGNOSIS — C44319 Basal cell carcinoma of skin of other parts of face: Secondary | ICD-10-CM | POA: Diagnosis not present

## 2017-10-27 ENCOUNTER — Ambulatory Visit: Payer: Medicare Other

## 2017-10-30 DIAGNOSIS — Z4802 Encounter for removal of sutures: Secondary | ICD-10-CM | POA: Diagnosis not present

## 2017-11-17 ENCOUNTER — Telehealth: Payer: Self-pay | Admitting: Family Medicine

## 2017-11-17 ENCOUNTER — Ambulatory Visit (INDEPENDENT_AMBULATORY_CARE_PROVIDER_SITE_OTHER): Payer: Medicare Other | Admitting: Family Medicine

## 2017-11-17 ENCOUNTER — Other Ambulatory Visit: Payer: Self-pay

## 2017-11-17 ENCOUNTER — Encounter: Payer: Self-pay | Admitting: Family Medicine

## 2017-11-17 VITALS — BP 138/82 | HR 85 | Temp 97.8°F | Ht 63.0 in | Wt 215.8 lb

## 2017-11-17 DIAGNOSIS — N289 Disorder of kidney and ureter, unspecified: Secondary | ICD-10-CM | POA: Diagnosis not present

## 2017-11-17 DIAGNOSIS — M25512 Pain in left shoulder: Secondary | ICD-10-CM | POA: Diagnosis not present

## 2017-11-17 DIAGNOSIS — I10 Essential (primary) hypertension: Secondary | ICD-10-CM

## 2017-11-17 DIAGNOSIS — G629 Polyneuropathy, unspecified: Secondary | ICD-10-CM

## 2017-11-17 DIAGNOSIS — J0100 Acute maxillary sinusitis, unspecified: Secondary | ICD-10-CM | POA: Diagnosis not present

## 2017-11-17 DIAGNOSIS — J310 Chronic rhinitis: Secondary | ICD-10-CM

## 2017-11-17 DIAGNOSIS — G8929 Other chronic pain: Secondary | ICD-10-CM

## 2017-11-17 MED ORDER — PREGABALIN 50 MG PO CAPS: 50.0000 mg | ORAL_CAPSULE | Freq: Three times a day (TID) | ORAL | 3 refills | Status: DC

## 2017-11-17 MED ORDER — AMOXICILLIN-POT CLAVULANATE 875-125 MG PO TABS: 1.0000 | ORAL_TABLET | Freq: Two times a day (BID) | ORAL | 0 refills | Status: DC

## 2017-11-17 MED ORDER — AZELASTINE HCL 0.1 % NA SOLN: 2.0000 | Freq: Two times a day (BID) | NASAL | 0 refills | Status: DC

## 2017-11-17 MED ORDER — TRIAMCINOLONE ACETONIDE 55 MCG/ACT NA AERO: 2.0000 | INHALATION_SPRAY | Freq: Every day | NASAL | 12 refills | Status: DC

## 2017-11-17 NOTE — Progress Notes (Signed)
11/11/20198:13 AM  Kristen Manning [birth year], 84 y.o. female [id number]  Chief Complaint  Patient presents with  . Follow-up    lymphedema of both lower extremities. Still having pain in the left are travels to the fingers. Went to PT for 2 wks only. Has not been bk due to finding out she had skin cancer surgery on the face. Next appt with PT is tomorrow    HPI:   Patient is a 84 y.o. female with past medical history significant for melanoma, OA, hypothyroidis, HTN, HLP, gout  who presents today for routine followup  Last visit sent to PT for lymphedema Given exercises for frozen shoulders  Overall shoulder not doing better, has been doing home exercise program Has had to reschedule PT for her shoulder Shoulder pain now down further her arm Diclofenac gel provides very little relief Pain started after she was given a zoster vaccine  They would not see her for lymphedema, they referred her to physician specialist, none are not [place] so she will not go  In the meanwhile she has seen GI, Dr Loletha Carrow after being sent by PA at urgent care She went in for abd pain and constant sore throat GI states it is MSK in nature and not GI  Treated for sinusitis about 3-4 months ago Having darkening nasal drainage, sinus pressure, headache for past several weeks, having some scant nasal bleeding Gets them regularly Uses daily flonase, nasal saline washes and mucinex Still has daily nasal drainage  In the past she has been on gabapentin - might have made her pass out She was afraid of side effects of lyrica so never tried it Has known DDD and neuropathy  Recently had BCC removed from left side of face Sees derm regularly  Fall Risk  11/17/2017 09/24/2017 09/15/2017 08/01/2017 03/31/2017  Falls in the past year? 0 No No No No  Number falls in past yr: - - - - -  Injury with Fall? - - - - -  Risk for fall due to : - - - - -  Risk for fall due to: Comment - - - - -  Follow up - - - - -      Depression screen [facility] 2/9 09/15/2017 08/01/2017 03/03/2017  Decreased Interest 0 0 0  Down, Depressed, Hopeless 0 0 0  PHQ - 2 Score 0 0 0  Altered sleeping - - 0  Tired, decreased energy - - 0  Change in appetite - - 0  Feeling bad or failure about yourself  - - 0  Trouble concentrating - - 0  Moving slowly or fidgety/restless - - 0  Suicidal thoughts - - 0  PHQ-9 Score - - 0  Difficult doing work/chores - - Not difficult at all    Allergies  Allergen Reactions  . Aspirin Other (See Comments)    Noted caused 2 holes in retina, not sure   . Codeine Hives  . Tizanidine Hcl Other (See Comments)    Confusion    Prior to Admission medications   Medication Sig Start Date End Date Taking? Authorizing Provider  acetaminophen (TYLENOL) 500 MG tablet Take 1 tablet (500 mg total) by mouth every 8 (eight) hours as needed for moderate pain. 09/11/16  Yes Midge Minium, MD  allopurinol (ZYLOPRIM) 300 MG tablet Take 300 mg by mouth daily. 06/27/15  Yes [provider]  Calcium 600-200 MG-UNIT tablet Take 1 tablet by mouth daily.   Yes [provider]  co-enzyme  Q-10 30 MG capsule Take 30 mg by mouth daily.   Yes [provider]  diclofenac sodium (VOLTAREN) 1 % GEL Apply 2 g topically 4 (four) times daily. 09/15/17  Yes Rutherford Guys, MD  ezetimibe (ZETIA) 10 MG tablet TAKE 1 TABLET BY MOUTH ONCE DAILY 08/28/17  Yes Midge Minium, MD  fenofibrate 160 MG tablet TAKE 1 TABLET BY MOUTH ONCE DAILY 09/23/17  Yes Midge Minium, MD  fluticasone (FLONASE) 50 MCG/ACT nasal spray Place 2 sprays into both nostrils daily. 10/10/16  Yes Brunetta Jeans, PA-C  furosemide (LASIX) 40 MG tablet Take 1 tablet (40 mg total) by mouth daily. 07/04/17  Yes Lorretta Harp, MD  levothyroxine (SYNTHROID, LEVOTHROID) 50 MCG tablet TAKE 1 TABLET BY MOUTH ONCE DAILY 07/09/17  Yes Midge Minium, MD  loratadine (CLARITIN) 10 MG tablet Take 1 tablet (10 mg total) by mouth  daily. 01/16/17  Yes Midge Minium, MD  NON FORMULARY Take 1 tablet by mouth daily.   Yes [provider]  omeprazole (PRILOSEC) 20 MG capsule Take 20 mg by mouth daily.   Yes [provider]  triamcinolone cream (KENALOG) 0.1 % Apply 1 application topically 2 (two) times daily. For two weeks as needed 09/15/17  Yes Rutherford Guys, MD    Past Medical History:  Diagnosis Date  . Arthritis   . Bacterial infection    in lungs in Jan 2015  . Bilateral lower extremity edema   . Cancer ([place])    skin cancer, back, legs melonama, sees Dr Lisabeth Pick, derm  . Chronic back pain   . GERD (gastroesophageal reflux disease)    takes Omeprazole daily  . Headache(784.0)   . History of migraine    last one about 15 yrs ago  . Hyperlipidemia    takes Zetia daily  . Hypertension    takes HCTZ daily  . Joint pain   . Joint swelling   . Myocardial infarction [facility])    pt states EKG always shows infarct but no change from previous ekg;never knew anything about it  . Nocturia   . Osteoarthritis   . Pneumonia    hx of-many yrs ago  . Pneumonia   . PONV (postoperative nausea and vomiting)   . Shortness of breath    with exertion  . Urinary frequency   . Urinary incontinence   . Urinary urgency     Past Surgical History:  Procedure Laterality Date  . bladder tack  1998  . BLEPHAROPLASTY Bilateral   . JOINT REPLACEMENT Left 10/02/2009  . JOINT REPLACEMENT Right 02-22-13   Knee  . KNEE SURGERY Bilateral 2009 and 2011  . OOPHORECTOMY  1998   only one  . TONSILLECTOMY  as a child ? date  . TOTAL KNEE ARTHROPLASTY Right 02/22/2013   DR Ronnie Derby  . TOTAL KNEE ARTHROPLASTY Right 02/22/2013   Procedure: TOTAL KNEE ARTHROPLASTY;  Surgeon: Vickey Huger, MD;  Location: [facility];  Service: Orthopedics;  Laterality: Right;    Social History   Tobacco Use  . Smoking status: Never Smoker  . Smokeless tobacco: Never Used  Substance Use Topics  . Alcohol use: No    Family History   Problem Relation Age of Onset  . Kidney disease Mother   . Heart disease Mother   . Hypertension Mother   . Varicose Veins Mother   . Kidney failure Mother   . Alcohol abuse Father   . Stroke Father  several   . Hypertension Father   . Varicose Veins Father   . Alcohol abuse Brother   . Hyperlipidemia Brother   . Hypertension Brother   . Early death Brother   . Varicose Veins Brother   . Heart disease Brother   . Hypertension Brother   . Arthritis Brother        Gout  . Varicose Veins Son   . Deep vein thrombosis Son   . Breast cancer Maternal Aunt     Review of Systems  Constitutional: Negative for chills and fever.  HENT: Positive for congestion, ear pain, nosebleeds, sinus pain and sore throat.   Respiratory: Positive for cough and sputum production. Negative for shortness of breath.   Cardiovascular: Positive for leg swelling. Negative for chest pain and palpitations.  Gastrointestinal: Negative for abdominal pain, nausea and vomiting.  Musculoskeletal: Positive for back pain, joint pain and myalgias.  Neurological: Positive for tingling.   Per hpi  OBJECTIVE:  Blood pressure (!) 159/65, pulse 85, temperature 97.8 F (36.6 C), temperature source Oral, height 5\' 3"  (1.6 m), weight 215 lb 12.8 oz (97.9 kg), SpO2 97 %. Body mass index is 38.23 kg/m.   Repeat BP 138/82  Physical Exam  Constitutional: She is oriented to person, place, and time. She appears well-developed and well-nourished.  HENT:  Head: Normocephalic and atraumatic.  Right Ear: Hearing, tympanic membrane, external ear and ear canal normal.  Left Ear: Hearing, tympanic membrane, external ear and ear canal normal.  Nose: Mucosal edema and rhinorrhea present. Epistaxis is observed. Right sinus exhibits maxillary sinus tenderness. Left sinus exhibits maxillary sinus tenderness.  Mouth/Throat: Oropharynx is clear and moist.  Eyes: Pupils are equal, round, and reactive to light. Conjunctivae and  EOM are normal.  Neck: Neck supple.  Cardiovascular: Normal rate, regular rhythm and normal heart sounds. Exam reveals no gallop and no friction rub.  No murmur heard. Pulmonary/Chest: Effort normal and breath sounds normal. She has no wheezes. She has no rales.  Musculoskeletal: She exhibits no edema.  Lymphadenopathy:    She has no cervical adenopathy.  Neurological: She is alert and oriented to person, place, and time.  Skin: Skin is warm and dry.  Psychiatric: She has a normal mood and affect.  Nursing note and vitals reviewed.   ASSESSMENT and PLAN  1. Renal insufficiency - Basic Metabolic Panel  2. Chronic left shoulder pain Will start PT tomorrow  3. Neuropathy Will do trial of lyrica, r/se/b reviewed. Titrate as needed/tolerate  4. Essential hypertension Discussed supportive measures, new meds r/se/b and RTC precautions. Patient educational handout given.  5. Acute non-recurrent maxillary sinusitis Discussed supportive measures, new meds r/se/b and RTC precautions.   6. Chronic rhinitis Changing to nasacort given dry bloody nose, adding azelastine. Consider ENT referral  Other orders - pregabalin (LYRICA) 50 MG capsule; Take 1 capsule (50 mg total) by mouth 3 (three) times daily. - triamcinolone (NASACORT) 55 MCG/ACT AERO nasal inhaler; Place 2 sprays into the nose daily. - azelastine (ASTELIN) 0.1 % nasal spray; Place 2 sprays into both nostrils 2 (two) times daily. Use in each nostril as directed - amoxicillin-clavulanate (AUGMENTIN) 875-125 MG tablet; Take 1 tablet by mouth 2 (two) times daily.  Return in about 6 weeks (around 12/29/2017).    Rutherford Guys, MD [facility] at [facility] [place], [place] [address] Ph.  [phone] Fax [phone]

## 2017-11-17 NOTE — Telephone Encounter (Signed)
Copied from [place] [id number]. Topic: General - Other >> Nov 17, 2017 12:24 PM Oneta Rack wrote: Relation to pt: self  Call back number: [phone]  Reason for call:  Patient was seen today by Dr. Pamella Pert, patient was advised to call back to inform PCP of medication she was allergic too, the medication is called tizanidine hcl

## 2017-11-17 NOTE — Addendum Note (Signed)
Addended by: Gari Crown D on: 11/17/2017 11:14 AM   Modules accepted: Orders

## 2017-11-17 NOTE — Patient Instructions (Signed)
° ° ° °  If you have lab work done today you will be contacted with your lab results within the next 2 weeks.  If you have not heard from us then please contact us. The fastest way to get your results is to register for My Chart. ° ° °IF you received an x-ray today, you will receive an invoice from [facility]. Please contact  Radiology at [phone] with questions or concerns regarding your invoice.  ° °IF you received labwork today, you will receive an invoice from [facility]. Please contact [facility] at [phone] with questions or concerns regarding your invoice.  ° °Our billing staff will not be able to assist you with questions regarding bills from these companies. ° °You will be contacted with the lab results as soon as they are available. The fastest way to get your results is to activate your My Chart account. Instructions are located on the last page of this paperwork. If you have not heard from us regarding the results in 2 weeks, please contact this office. °  ° ° ° °

## 2017-11-18 ENCOUNTER — Ambulatory Visit: Payer: Medicare Other | Attending: Family Medicine | Admitting: Physical Therapy

## 2017-11-18 ENCOUNTER — Encounter: Payer: Self-pay | Admitting: Physical Therapy

## 2017-11-18 ENCOUNTER — Other Ambulatory Visit: Payer: Self-pay

## 2017-11-18 DIAGNOSIS — M25512 Pain in left shoulder: Secondary | ICD-10-CM | POA: Diagnosis not present

## 2017-11-18 DIAGNOSIS — M25612 Stiffness of left shoulder, not elsewhere classified: Secondary | ICD-10-CM | POA: Insufficient documentation

## 2017-11-18 LAB — BASIC METABOLIC PANEL
BUN/Creatinine Ratio: 18 (ref 12–28)
BUN: 23 mg/dL (ref 8–27)
CO2: 20 mmol/L (ref 20–29)
Calcium: 9.5 mg/dL (ref 8.7–10.3)
Chloride: 106 mmol/L (ref 96–106)
Creatinine, Ser: 1.25 mg/dL — ABNORMAL HIGH (ref 0.57–1.00)
GFR calc Af Amer: 46 mL/min/{1.73_m2} — ABNORMAL LOW (ref >59)
GFR calc non Af Amer: 40 mL/min/{1.73_m2} — ABNORMAL LOW (ref >59)
Glucose: 78 mg/dL (ref 65–99)
Potassium: 4.9 mmol/L (ref 3.5–5.2)
Sodium: 142 mmol/L (ref 134–144)

## 2017-11-18 NOTE — Telephone Encounter (Signed)
It is in her allergies per computer

## 2017-11-18 NOTE — Patient Instructions (Signed)
Access Code: [email or web address]  URL: [email or web address]  Date: 11/18/2017  Prepared by: Lum Babe   Exercises  Seated Shoulder Shrug Circles AROM Backward - 10 reps - 1 sets - 2 hold - 2x daily - 7x weekly  Seated Elbow Extension and Shoulder External Rotation AAROM at Table with Towel - 10 reps - 1 sets - 2 hold - 2x daily - 7x weekly  Seated Shoulder Flexion Extension AAROM with Dowel into Wall - 10 reps - 1 sets - 3 hold - 2x daily - 7x weekly  Seated Shoulder External Rotation AAROM with Dowel - 10 reps - 1 sets - 3 hold - 2x daily - 7x weekly

## 2017-11-18 NOTE — Therapy (Signed)
[facility] [address] [place], [place], [address] Phone: [phone]   Fax:  [phone]  Physical Therapy Evaluation  Patient Details  Name: Kristen Manning MRN: [id number] Date of Birth: July 07, 1932 Referring Provider (PT): Pamella Pert   Encounter Date: 11/18/2017  PT End of Session - 11/18/17 0826    Visit Number  1    Date for PT Re-Evaluation  01/18/18    PT Start Time  0800    PT Stop Time  0858    PT Time Calculation (min)  58 min    Activity Tolerance  Patient tolerated treatment well    Behavior During Therapy  [facility] for tasks assessed/performed       Past Medical History:  Diagnosis Date  . Arthritis   . Bacterial infection    in lungs in Jan 2015  . Bilateral lower extremity edema   . Cancer ([place])    skin cancer, back, legs melonama, sees Dr Lisabeth Pick, derm  . Chronic back pain   . GERD (gastroesophageal reflux disease)    takes Omeprazole daily  . Headache(784.0)   . History of migraine    last one about 15 yrs ago  . Hyperlipidemia    takes Zetia daily  . Hypertension    takes HCTZ daily  . Joint pain   . Joint swelling   . Myocardial infarction [facility])    pt states EKG always shows infarct but no change from previous ekg;never knew anything about it  . Nocturia   . Osteoarthritis   . Pneumonia    hx of-many yrs ago  . Pneumonia   . PONV (postoperative nausea and vomiting)   . Shortness of breath    with exertion  . Urinary frequency   . Urinary incontinence   . Urinary urgency     Past Surgical History:  Procedure Laterality Date  . bladder tack  1998  . BLEPHAROPLASTY Bilateral   . JOINT REPLACEMENT Left 10/02/2009  . JOINT REPLACEMENT Right 02-22-13   Knee  . KNEE SURGERY Bilateral 2009 and 2011  . OOPHORECTOMY  1998   only one  . TONSILLECTOMY  as a child ? date  . TOTAL KNEE ARTHROPLASTY Right 02/22/2013   DR Ronnie Derby  . TOTAL KNEE ARTHROPLASTY Right 02/22/2013   Procedure:  TOTAL KNEE ARTHROPLASTY;  Surgeon: Vickey Huger, MD;  Location: [facility];  Service: Orthopedics;  Laterality: Right;    There were no vitals filed for this visit.   Subjective Assessment - 11/18/17 0802    Subjective  Patient reports that in June she had a shingles vaccination, she reports that she feels like they hit the nerve, she reports that she had pain in the arm down to the hand severe to the point she went to the ED.  She reports that the pain has not improved and her ROM has gotten worse.    Limitations  Lifting;House hold activities    Patient Stated Goals  have better ROM and less pain    Currently in Pain?  Yes    Pain Score  4     Pain Location  Shoulder    Pain Orientation  Left    Pain Descriptors / Indicators  Aching    Pain Type  Acute pain    Pain Radiating Towards  reports that she has pain going down the left arm to the left hand    Pain Onset  More than a month ago  Pain Frequency  Constant    Aggravating Factors   any reaching, lifting, trying to do hair and get dressed pain up to 9/10    Pain Relieving Factors  rest, not doing anything pain at best a 4/10    Effect of Pain on Daily Activities  difficulty doing hair, getting dressed         [facility] PT Assessment - 11/18/17 0001      Assessment   Medical Diagnosis  left shoulder pain and stiffness    Referring Provider (PT)  Pamella Pert    Onset Date/Surgical Date  06/18/17    Prior Therapy  no      Precautions   Precautions  None      Balance Screen   Has the patient fallen in the past 6 months  Yes    How many times?  1    Has the patient had a decrease in activity level because of a fear of falling?   No    Is the patient reluctant to leave their home because of a fear of falling?   No      Home Environment   Additional Comments  lives with brother, she does the cooking and tries to clean      Prior Function   Level of Independence  Independent    Vocation  Retired    Occupational psychologist     Leisure  no exercise      Posture/Postural Control   Posture Comments  fwd head, rounded shoulders      ROM / Strength   AROM / PROM / Strength  AROM;PROM;Strength      AROM   Overall AROM Comments  a lot of compensation with the upper trap    AROM Assessment Site  Shoulder    Right/Left Shoulder  Left    Left Shoulder Flexion  50 Degrees    Left Shoulder ABduction  43 Degrees    Left Shoulder Internal Rotation  30 Degrees   arm at side   Left Shoulder External Rotation  5 Degrees   arm at side     PROM   PROM Assessment Site  Shoulder    Right/Left Shoulder  Left    Left Shoulder Flexion  64 Degrees    Left Shoulder ABduction  50 Degrees      Strength   Overall Strength Comments  ER/IR at neutral 3+/5, unable to get the arm away from the body to test other motions, she is very painful      Palpation   Palpation comment  she is very tender and gaurded to touch, very t=painful in the left upper arm and the left upper trap to the neck area                Objective measurements completed on examination: See above findings.      [facility] Adult PT Treatment/Exercise - 11/18/17 0001      Modalities   Modalities  Electrical Stimulation;Moist Heat      Moist Heat Therapy   Number Minutes Moist Heat  15 Minutes    Moist Heat Location  Shoulder      Electrical Stimulation   Electrical Stimulation Location  left upper arm to the upper trap    Electrical Stimulation Action  IFC    Electrical Stimulation Parameters  sitting    Electrical Stimulation Goals  Pain               PT Short  Term Goals - 11/18/17 0833      PT SHORT TERM GOAL #1   Title  independent with initial HEP    Time  2    Period  Weeks    Status  New        PT Long Term Goals - 11/18/17 3295      PT LONG TERM GOAL #1   Title  decrease pain 25%    Time  8    Period  Weeks    Status  New      PT LONG TERM GOAL #2   Title  increase left shoulder flexion to 100 degrees    Time  8     Period  Weeks    Status  New      PT LONG TERM GOAL #3   Title  repoort that she can vacuum without difficulty    Time  8    Period  Weeks    Status  New      PT LONG TERM GOAL #4   Title  report able to dress without difficulty    Time  8    Period  Weeks    Status  New      PT LONG TERM GOAL #5   Title  report able to do hair without difficulty    Time  8    Period  Weeks    Status  New             Plan - 11/18/17 0827    Clinical Impression Statement  Patient reports that she had a shingles injection in June, reports that they "hit my nerve" she reports that she had immediate pain int he arm down to the hand, reports that she went to the ED the next day due to pain, she ahs been gaurded and has lost significant ROM since that time, she has significant compensation of the upper trap, she is extremely tender in the left upper arm and the left upper trap to the neck, her AROM and PROM are extremely limited.  Seems like she has stopped moving due to the pain.    History and Personal Factors relevant to plan of care:  OA, lung issues, bilatreal knee replacement    Clinical Presentation  Evolving    Clinical Decision Making  Moderate    Rehab Potential  Fair    PT Frequency  2x / week    PT Duration  8 weeks    PT Treatment/Interventions  ADLs/Self Care Home Management;Cryotherapy;Electrical Stimulation;Iontophoresis 4mg /ml Dexamethasone;Moist Heat;Ultrasound;Therapeutic exercise;Therapeutic activities;Patient/family education;Manual techniques;Dry needling    PT Next Visit Plan  Start slow gentle motions    Consulted and Agree with Plan of Care  Patient       Patient will benefit from skilled therapeutic intervention in order to improve the following deficits and impairments:  Abnormal gait, Decreased coordination, Decreased range of motion, Difficulty walking, Impaired UE functional use, Increased muscle spasms, Pain, Improper body mechanics, Decreased mobility,  Decreased strength, Postural dysfunction  Visit Diagnosis: Acute pain of left shoulder - Plan: PT plan of care cert/re-cert  Stiffness of left shoulder, not elsewhere classified - Plan: PT plan of care cert/re-cert     Problem List Patient Active Problem List   Diagnosis Date Noted  . Muscle spasm 10/28/2016  . DDD (degenerative disc disease), lumbar 10/07/2016  . Primary osteoarthritis of both hands 10/07/2016  . Primary osteoarthritis of both feet 10/07/2016  . Tension-type headache, not intractable 09/16/2016  .  GERD (gastroesophageal reflux disease) 09/11/2016  . ANA positive 09/02/2016  . Myalgia 06/26/2016  . Shortness of breath 03/22/2015  . History of total knee replacement, bilateral 03/20/2015  . Hypothyroidism 09/21/2014  . Cephalalgia 09/15/2014  . Gout 06/20/2014  . Lower leg edema 06/13/2014  . Pain in both feet 06/13/2014  . Chest wall pain 04/29/2014  . Meningioma ([place]) 10/07/2013  . Chronic tension-type headache, intractable 10/07/2013  . Pain of right lower leg 08/11/2013  . Neck pain 07/28/2013  . Swelling of limb 07/07/2013  . Headache(784.0) 07/07/2013  . Depression 06/23/2013  . Insomnia 05/26/2013  . Routine general medical examination at a health care facility 10/17/2011  . HTN (hypertension) 07/03/2011  . Hyperlipidemia 07/03/2011  . Venous insufficiency, peripheral 07/03/2011    Sumner Boast., PT 11/18/2017, 8:45 AM  [facility] [address] [place], [place], [address] Phone: [phone]   Fax:  [phone]  Name: Kristen Manning MRN: [id number] Date of Birth: 09-01-1932

## 2017-11-19 NOTE — Telephone Encounter (Signed)
noted 

## 2017-11-24 ENCOUNTER — Ambulatory Visit: Payer: Medicare Other | Admitting: Physical Therapy

## 2017-11-24 DIAGNOSIS — M25512 Pain in left shoulder: Secondary | ICD-10-CM | POA: Diagnosis not present

## 2017-11-24 DIAGNOSIS — M25612 Stiffness of left shoulder, not elsewhere classified: Secondary | ICD-10-CM | POA: Diagnosis not present

## 2017-11-24 NOTE — Therapy (Signed)
[place] [place], [place], [address] Phone: [phone]   Fax:  [phone]  Physical Therapy Treatment  Patient Details  Name: Kristen Manning MRN: [id number] Date of Birth: [birth year] Referring Provider (PT): Pamella Pert   Encounter Date: 11/24/2017  PT End of Session - 11/24/17 0758    Visit Number  2    Date for PT Re-Evaluation  01/18/18    PT Start Time  0800    PT Stop Time  0845    PT Time Calculation (min)  45 min    Activity Tolerance  Patient limited by pain    Behavior During Therapy  [facility] for tasks assessed/performed       Past Medical History:  Diagnosis Date  . Arthritis   . Bacterial infection    in lungs in Jan 2015  . Bilateral lower extremity edema   . Cancer ([place])    skin cancer, back, legs melonama, sees Dr Lisabeth Pick, derm  . Chronic back pain   . GERD (gastroesophageal reflux disease)    takes Omeprazole daily  . Headache(784.0)   . History of migraine    last one about 15 yrs ago  . Hyperlipidemia    takes Zetia daily  . Hypertension    takes HCTZ daily  . Joint pain   . Joint swelling   . Myocardial infarction [facility])    pt states EKG always shows infarct but no change from previous ekg;never knew anything about it  . Nocturia   . Osteoarthritis   . Pneumonia    hx of-many yrs ago  . Pneumonia   . PONV (postoperative nausea and vomiting)   . Shortness of breath    with exertion  . Urinary frequency   . Urinary incontinence   . Urinary urgency     Past Surgical History:  Procedure Laterality Date  . bladder tack  1998  . BLEPHAROPLASTY Bilateral   . JOINT REPLACEMENT Left 10/02/2009  . JOINT REPLACEMENT Right 02-22-13   Knee  . KNEE SURGERY Bilateral 2009 and 2011  . OOPHORECTOMY  1998   only one  . TONSILLECTOMY  as a child ? date  . TOTAL KNEE ARTHROPLASTY Right 02/22/2013   DR Ronnie Derby  . TOTAL KNEE ARTHROPLASTY Right 02/22/2013   Procedure: TOTAL KNEE  ARTHROPLASTY;  Surgeon: Vickey Huger, MD;  Location: [place];  Service: Orthopedics;  Laterality: Right;    There were no vitals filed for this visit.  Subjective Assessment - 11/24/17 0758    Subjective  "I had a spell last night. It was really bad"    Limitations  Lifting;House hold activities    Patient Stated Goals  have better ROM and less pain    Currently in Pain?  Yes    Pain Score  3     Pain Location  Shoulder    Pain Orientation  Left    Pain Descriptors / Indicators  Aching    Pain Type  Acute pain    Pain Onset  More than a month ago                       [facility] Adult PT Treatment/Exercise - 11/24/17 0001      Exercises   Exercises  Shoulder      Shoulder Exercises: Seated   Elevation  Both;10 reps;Strengthening    Retraction  Strengthening;Both;10 reps    External Rotation  AAROM;Left;20 reps  External Rotation Limitations  with dowel      Shoulder Exercises: Standing   Flexion  AAROM;Left;20 reps    Flexion Limitations  with dowel      Shoulder Exercises: ROM/Strengthening   UBE (Upper Arm Bike)  L1 x 4 min fwd only      Shoulder Exercises: Stretch   Other Shoulder Stretches  Left UT and Levator stretch 2x30 sec ea      Modalities   Modalities  Electrical Stimulation;Iontophoresis;Moist Heat      Moist Heat Therapy   Number Minutes Moist Heat  15 Minutes    Moist Heat Location  Shoulder      Electrical Stimulation   Electrical Stimulation Location  left upper arm to the upper trap ant/post shoulder    Electrical Stimulation Action  IFC    Electrical Stimulation Parameters  supine    Electrical Stimulation Goals  Pain      Iontophoresis   Type of Iontophoresis  Dexamethasone    Location  left deltoid    Dose  11mmin    Time  4 hour patch      Manual Therapy   Manual Therapy  Joint mobilization;Scapular mobilization    Manual therapy comments  unable to do any STW or PROM due to pain     Joint Mobilization  gentle oscillations  to left shoulder    Scapular Mobilization  in supine; protraction mobs             PT Education - 11/24/17 0910    Education Details  educated on iontophoresis precautions and contraindications and about home TENS unit    Person(s) Educated  Patient    Methods  Explanation;Handout    Comprehension  Verbalized understanding       PT Short Term Goals - 11/18/17 0833      PT SHORT TERM GOAL #1   Title  independent with initial HEP    Time  2    Period  Weeks    Status  New        PT Long Term Goals - 11/18/17 07062     PT LONG TERM GOAL #1   Title  decrease pain 25%    Time  8    Period  Weeks    Status  New      PT LONG TERM GOAL #2   Title  increase left shoulder flexion to 100 degrees    Time  8    Period  Weeks    Status  New      PT LONG TERM GOAL #3   Title  repoort that she can vacuum without difficulty    Time  8    Period  Weeks    Status  New      PT LONG TERM GOAL #4   Title  report able to dress without difficulty    Time  8    Period  Weeks    Status  New      PT LONG TERM GOAL #5   Title  report able to do hair without difficulty    Time  8    Period  Weeks    Status  New            Plan - 11/24/17 0829    Clinical Impression Statement  Patient did not tolerate manual treatment well today. She did fairly well with exercise and reports compliance with HEP. She reported relief with estim and heat from  last treatment so we did this again and started a round of iontophoresis for the inflammation. No goals met as only second visit.    PT Frequency  2x / week    PT Duration  8 weeks    PT Treatment/Interventions  ADLs/Self Care Home Management;Cryotherapy;Electrical Stimulation;Iontophoresis 72m/ml Dexamethasone;Moist Heat;Ultrasound;Therapeutic exercise;Therapeutic activities;Patient/family education;Manual techniques;Dry needling    PT Next Visit Plan  Assess ionto and continue as indicated, Start slow gentle motions    Consulted and  Agree with Plan of Care  Patient       Patient will benefit from skilled therapeutic intervention in order to improve the following deficits and impairments:  Abnormal gait, Decreased coordination, Decreased range of motion, Difficulty walking, Impaired UE functional use, Increased muscle spasms, Pain, Improper body mechanics, Decreased mobility, Decreased strength, Postural dysfunction  Visit Diagnosis: Acute pain of left shoulder  Stiffness of left shoulder, not elsewhere classified     Problem List Patient Active Problem List   Diagnosis Date Noted  . Muscle spasm 10/28/2016  . DDD (degenerative disc disease), lumbar 10/07/2016  . Primary osteoarthritis of both hands 10/07/2016  . Primary osteoarthritis of both feet 10/07/2016  . Tension-type headache, not intractable 09/16/2016  . GERD (gastroesophageal reflux disease) 09/11/2016  . ANA positive 09/02/2016  . Myalgia 06/26/2016  . Shortness of breath 03/22/2015  . History of total knee replacement, bilateral 03/20/2015  . Hypothyroidism 09/21/2014  . Cephalalgia 09/15/2014  . Gout 06/20/2014  . Lower leg edema 06/13/2014  . Pain in both feet 06/13/2014  . Chest wall pain 04/29/2014  . [place] 10/07/2013  . Chronic tension-type headache, intractable 10/07/2013  . Pain of right lower leg 08/11/2013  . Neck pain 07/28/2013  . Swelling of limb 07/07/2013  . Headache(784.0) 07/07/2013  . Depression 06/23/2013  . Insomnia 05/26/2013  . Routine general medical examination at a health care facility 10/17/2011  . HTN (hypertension) 07/03/2011  . Hyperlipidemia 07/03/2011  . Venous insufficiency, peripheral 07/03/2011    JMadelyn FlavorsPT 11/24/2017, 3:20 PM  [place] [address]Phone: [phone]  Fax:  [phone] Name: Kristen KALLIOMRN: [id number] of Birth: 902-19-1934

## 2017-11-27 ENCOUNTER — Ambulatory Visit: Payer: Medicare Other | Admitting: Physical Therapy

## 2017-11-27 DIAGNOSIS — M25512 Pain in left shoulder: Secondary | ICD-10-CM | POA: Diagnosis not present

## 2017-11-27 DIAGNOSIS — M25612 Stiffness of left shoulder, not elsewhere classified: Secondary | ICD-10-CM | POA: Diagnosis not present

## 2017-11-27 NOTE — Therapy (Signed)
[place] [place], [place], [address] Phone: [phone]   Fax:  [phone]  Physical Therapy Treatment  Patient Details  Name: Kristen Manning MRN: [id number] Date of Birth: 11-13-1932 Referring Provider (PT): Pamella Pert   Encounter Date: 11/27/2017  PT End of Session - 11/27/17 0937    Visit Number  3    Date for PT Re-Evaluation  01/18/18    PT Start Time  0934    PT Stop Time  1013    PT Time Calculation (min)  39 min    Activity Tolerance  Patient limited by pain    Behavior During Therapy  [facility] for tasks assessed/performed       Past Medical History:  Diagnosis Date  . Arthritis   . Bacterial infection    in lungs in Jan 2015  . Bilateral lower extremity edema   . Cancer ([place])    skin cancer, back, legs melonama, sees Dr Lisabeth Pick, derm  . Chronic back pain   . GERD (gastroesophageal reflux disease)    takes Omeprazole daily  . Headache(784.0)   . History of migraine    last one about 15 yrs ago  . Hyperlipidemia    takes Zetia daily  . Hypertension    takes HCTZ daily  . Joint pain   . Joint swelling   . Myocardial infarction [facility])    pt states EKG always shows infarct but no change from previous ekg;never knew anything about it  . Nocturia   . Osteoarthritis   . Pneumonia    hx of-many yrs ago  . Pneumonia   . PONV (postoperative nausea and vomiting)   . Shortness of breath    with exertion  . Urinary frequency   . Urinary incontinence   . Urinary urgency     Past Surgical History:  Procedure Laterality Date  . bladder tack  1998  . BLEPHAROPLASTY Bilateral   . JOINT REPLACEMENT Left 10/02/2009  . JOINT REPLACEMENT Right 02-22-13   Knee  . KNEE SURGERY Bilateral 2009 and 2011  . OOPHORECTOMY  1998   only one  . TONSILLECTOMY  as a child ? date  . TOTAL KNEE ARTHROPLASTY Right 02/22/2013   DR Ronnie Derby  . TOTAL KNEE ARTHROPLASTY Right 02/22/2013   Procedure: TOTAL KNEE  ARTHROPLASTY;  Surgeon: Vickey Huger, MD;  Location: [place];  Service: Orthopedics;  Laterality: Right;    There were no vitals filed for this visit.  Subjective Assessment - 11/27/17 0937    Subjective  It's feeling a little better with the heat and the patch was great    Patient Stated Goals  have better ROM and less pain    Currently in Pain?  Yes    Pain Score  3     Pain Location  Shoulder    Pain Orientation  Left    Pain Descriptors / Indicators  Aching    Pain Type  Acute pain    Pain Onset  More than a month ago                       [facility] Adult PT Treatment/Exercise - 11/27/17 0001      Exercises   Exercises  Shoulder      Shoulder Exercises: Seated   Retraction  Strengthening;Both;20 reps    External Rotation  AAROM;Left;20 reps    Flexion  AAROM;Left;20 reps;10 reps    Flexion Limitations  with cane on wall      Shoulder Exercises: Pulleys   Flexion  3 minutes;1 minute      Shoulder Exercises: ROM/Strengthening   UBE (Upper Arm Bike)  L1 x 2 min fwd only      Modalities   Modalities  Iontophoresis      Iontophoresis   Type of Iontophoresis  Dexamethasone    Location  ant left shoulder    Dose  37mAmin    Time  4 hour patch      Manual Therapy   Manual Therapy  Joint mobilization;Passive ROM    Joint Mobilization  gentle oscillations to left shoulder    Passive ROM  to left shoulder ER and flexion in pain free range; attempted contract/relax but no improvement in range               PT Short Term Goals - 11/18/17 7408      PT SHORT TERM GOAL #1   Title  independent with initial HEP    Time  2    Period  Weeks    Status  New        PT Long Term Goals - 11/18/17 1448      PT LONG TERM GOAL #1   Title  decrease pain 25%    Time  8    Period  Weeks    Status  New      PT LONG TERM GOAL #2   Title  increase left shoulder flexion to 100 degrees    Time  8    Period  Weeks    Status  New      PT LONG TERM GOAL #3    Title  repoort that she can vacuum without difficulty    Time  8    Period  Weeks    Status  New      PT LONG TERM GOAL #4   Title  report able to dress without difficulty    Time  8    Period  Weeks    Status  New      PT LONG TERM GOAL #5   Title  report able to do hair without difficulty    Time  8    Period  Weeks    Status  New            Plan - 11/27/17 1245    Clinical Impression Statement  Patient tolerated treatment much better today. She reported significant relief with iontophoresis and patch #2 was applied today. She is still to tender to do STW on but could tolerate some PROM.    PT Treatment/Interventions  ADLs/Self Care Home Management;Cryotherapy;Electrical Stimulation;Iontophoresis 4mg /ml Dexamethasone;Moist Heat;Ultrasound;Therapeutic exercise;Therapeutic activities;Patient/family education;Manual techniques;Dry needling    PT Next Visit Plan  Gentle TE, ROM; continue ionto       Patient will benefit from skilled therapeutic intervention in order to improve the following deficits and impairments:  Abnormal gait, Decreased coordination, Decreased range of motion, Difficulty walking, Impaired UE functional use, Increased muscle spasms, Pain, Improper body mechanics, Decreased mobility, Decreased strength, Postural dysfunction  Visit Diagnosis: Acute pain of left shoulder  Stiffness of left shoulder, not elsewhere classified     Problem List Patient Active Problem List   Diagnosis Date Noted  . Muscle spasm 10/28/2016  . DDD (degenerative disc disease), lumbar 10/07/2016  . Primary osteoarthritis of both hands 10/07/2016  . Primary osteoarthritis of both feet 10/07/2016  . Tension-type headache, not intractable 09/16/2016  .  GERD (gastroesophageal reflux disease) 09/11/2016  . ANA positive 09/02/2016  . Myalgia 06/26/2016  . Shortness of breath 03/22/2015  . History of total knee replacement, bilateral 03/20/2015  . Hypothyroidism 09/21/2014  .  Cephalalgia 09/15/2014  . Gout 06/20/2014  . Lower leg edema 06/13/2014  . Pain in both feet 06/13/2014  . Chest wall pain 04/29/2014  . Meningioma ([place]) 10/07/2013  . Chronic tension-type headache, intractable 10/07/2013  . Pain of right lower leg 08/11/2013  . Neck pain 07/28/2013  . Swelling of limb 07/07/2013  . Headache(784.0) 07/07/2013  . Depression 06/23/2013  . Insomnia 05/26/2013  . Routine general medical examination at a health care facility 10/17/2011  . HTN (hypertension) 07/03/2011  . Hyperlipidemia 07/03/2011  . Venous insufficiency, peripheral 07/03/2011   Madelyn Flavors PT 11/27/2017, 12:48 PM  [place] [address] [place] [place], [place], [address] Phone: [phone]   Fax:  [phone]  Name: Kristen Manning MRN: [id number] Date of Birth: 1932/05/24

## 2017-12-02 ENCOUNTER — Ambulatory Visit: Payer: Medicare Other | Admitting: Physical Therapy

## 2017-12-02 ENCOUNTER — Telehealth: Payer: Self-pay | Admitting: Family Medicine

## 2017-12-02 ENCOUNTER — Encounter: Payer: Self-pay | Admitting: Physical Therapy

## 2017-12-02 DIAGNOSIS — M25512 Pain in left shoulder: Secondary | ICD-10-CM

## 2017-12-02 DIAGNOSIS — M25612 Stiffness of left shoulder, not elsewhere classified: Secondary | ICD-10-CM

## 2017-12-02 NOTE — Telephone Encounter (Signed)
Copied from [place] [id number]. Topic: Referral - Request for Referral >> Dec 02, 2017  1:15 PM Lennox Solders wrote: Has patient seen PCP for this complaint? Yes  Pt is calling and she has had 2 knee replacement and would like physical therapy on her knees due trouble walking/standing. Pt would like to go to [facility]. -pt is going to [place] for her shoulder now. Please call [phone]

## 2017-12-02 NOTE — Telephone Encounter (Signed)
Per [facility] pt has been seen there already and was seen on today.  Advised calling to see what was needed for pt to start but if she is now coming the PT request has already been taking care of.   Dgaddy, CMA

## 2017-12-02 NOTE — Therapy (Signed)
[place] [address] [place], [place], [address] Phone: [phone]   Fax:  [phone]  Physical Therapy Treatment  Patient Details  Name: Kristen Manning MRN: [id number] Date of Birth: 1932-09-03 Referring Provider (PT): Pamella Pert   Encounter Date: 12/02/2017  PT End of Session - 12/02/17 0836    Visit Number  4    Date for PT Re-Evaluation  01/18/18    PT Start Time  0805    PT Stop Time  7619    PT Time Calculation (min)  50 min    Activity Tolerance  Patient limited by pain    Behavior During Therapy  [facility] for tasks assessed/performed       Past Medical History:  Diagnosis Date  . Arthritis   . Bacterial infection    in lungs in Jan 2015  . Bilateral lower extremity edema   . Cancer ([place])    skin cancer, back, legs melonama, sees Dr Lisabeth Pick, derm  . Chronic back pain   . GERD (gastroesophageal reflux disease)    takes Omeprazole daily  . Headache(784.0)   . History of migraine    last one about 15 yrs ago  . Hyperlipidemia    takes Zetia daily  . Hypertension    takes HCTZ daily  . Joint pain   . Joint swelling   . Myocardial infarction [facility])    pt states EKG always shows infarct but no change from previous ekg;never knew anything about it  . Nocturia   . Osteoarthritis   . Pneumonia    hx of-many yrs ago  . Pneumonia   . PONV (postoperative nausea and vomiting)   . Shortness of breath    with exertion  . Urinary frequency   . Urinary incontinence   . Urinary urgency     Past Surgical History:  Procedure Laterality Date  . bladder tack  1998  . BLEPHAROPLASTY Bilateral   . JOINT REPLACEMENT Left 10/02/2009  . JOINT REPLACEMENT Right 02-22-13   Knee  . KNEE SURGERY Bilateral 2009 and 2011  . OOPHORECTOMY  1998   only one  . TONSILLECTOMY  as a child ? date  . TOTAL KNEE ARTHROPLASTY Right 02/22/2013   DR Ronnie Derby  . TOTAL KNEE ARTHROPLASTY Right 02/22/2013   Procedure: TOTAL KNEE  ARTHROPLASTY;  Surgeon: Vickey Huger, MD;  Location: [place];  Service: Orthopedics;  Laterality: Right;    There were no vitals filed for this visit.  Subjective Assessment - 12/02/17 0811    Subjective  I think it is a little better.    Currently in Pain?  Yes    Pain Score  3     Pain Location  Shoulder    Pain Orientation  Left    Aggravating Factors   any motions    Pain Relieving Factors  the heat         [facility] PT Assessment - 12/02/17 0001      AROM   Right/Left Shoulder  Left    Left Shoulder Flexion  50 Degrees    Left Shoulder External Rotation  10 Degrees      PROM   Left Shoulder Flexion  90 Degrees    Left Shoulder ABduction  70 Degrees                   [facility] Adult PT Treatment/Exercise - 12/02/17 0001      Exercises   Exercises  Shoulder  Shoulder Exercises: Seated   Retraction  Strengthening;Both;20 reps    Theraband Level (Shoulder Retraction)  Level 1 (Yellow)    Row  Both;20 reps;Theraband    Theraband Level (Shoulder Row)  Level 1 (Yellow)    External Rotation  Both;20 reps;Theraband    Theraband Level (Shoulder External Rotation)  Level 1 (Yellow)    Internal Rotation  Both;20 reps;Theraband    Theraband Level (Shoulder Internal Rotation)  Level 1 (Yellow)    Flexion  AAROM;Left;20 reps;10 reps    Flexion Limitations  with wand      Shoulder Exercises: ROM/Strengthening   UBE (Upper Arm Bike)  L1 x 3 min fwd only      Modalities   Modalities  Electrical Stimulation;Moist Heat      Moist Heat Therapy   Number Minutes Moist Heat  15 Minutes    Moist Heat Location  Shoulder      Electrical Stimulation   Electrical Stimulation Location  left upper arm to the upper trap ant/post shoulder    Electrical Stimulation Action  IFC    Electrical Stimulation Parameters  sitting    Electrical Stimulation Goals  Pain      Iontophoresis   Type of Iontophoresis  Dexamethasone    Location  ant left shoulder    Dose  45mAmin    Time  4  hour patch      Manual Therapy   Manual Therapy  Passive ROM    Passive ROM  left shoulder PROM and AAROM               PT Short Term Goals - 12/02/17 9798      PT SHORT TERM GOAL #1   Title  independent with initial HEP    Status  On-going        PT Long Term Goals - 12/02/17 9211      PT LONG TERM GOAL #1   Title  decrease pain 25%    Status  On-going      PT LONG TERM GOAL #2   Title  increase left shoulder flexion to 100 degrees    Status  On-going            Plan - 12/02/17 0836    Clinical Impression Statement  Patient with minimal increase in AROM, but very good gains in PROM/AAROM.  She seems gaurded and limited due to pain.  The arm is tender and she does not allow STM, she started to allow some increeased exercises but it seems that she has been in so much pain she is very worried about any motions    PT Next Visit Plan  Gentle TE, ROM; continue ionto    Consulted and Agree with Plan of Care  Patient       Patient will benefit from skilled therapeutic intervention in order to improve the following deficits and impairments:  Abnormal gait, Decreased coordination, Decreased range of motion, Difficulty walking, Impaired UE functional use, Increased muscle spasms, Pain, Improper body mechanics, Decreased mobility, Decreased strength, Postural dysfunction  Visit Diagnosis: Acute pain of left shoulder  Stiffness of left shoulder, not elsewhere classified     Problem List Patient Active Problem List   Diagnosis Date Noted  . Muscle spasm 10/28/2016  . DDD (degenerative disc disease), lumbar 10/07/2016  . Primary osteoarthritis of both hands 10/07/2016  . Primary osteoarthritis of both feet 10/07/2016  . Tension-type headache, not intractable 09/16/2016  . GERD (gastroesophageal reflux disease) 09/11/2016  . ANA  positive 09/02/2016  . Myalgia 06/26/2016  . Shortness of breath 03/22/2015  . History of total knee replacement, bilateral 03/20/2015   . Hypothyroidism 09/21/2014  . Cephalalgia 09/15/2014  . Gout 06/20/2014  . Lower leg edema 06/13/2014  . Pain in both feet 06/13/2014  . Chest wall pain 04/29/2014  . Meningioma ([place]) 10/07/2013  . Chronic tension-type headache, intractable 10/07/2013  . Pain of right lower leg 08/11/2013  . Neck pain 07/28/2013  . Swelling of limb 07/07/2013  . Headache(784.0) 07/07/2013  . Depression 06/23/2013  . Insomnia 05/26/2013  . Routine general medical examination at a health care facility 10/17/2011  . HTN (hypertension) 07/03/2011  . Hyperlipidemia 07/03/2011  . Venous insufficiency, peripheral 07/03/2011    Sumner Boast., PT 12/02/2017, 9:28 AM  [facility] [address] [facility] [place], [place], [address] Phone: [phone]   Fax:  [phone]  Name: Kristen Manning MRN: [id number] Date of Birth: 02/15/1932

## 2017-12-08 ENCOUNTER — Ambulatory Visit: Payer: Medicare Other | Attending: Family Medicine | Admitting: Physical Therapy

## 2017-12-08 ENCOUNTER — Encounter: Payer: Self-pay | Admitting: Physical Therapy

## 2017-12-08 DIAGNOSIS — G8929 Other chronic pain: Secondary | ICD-10-CM | POA: Insufficient documentation

## 2017-12-08 DIAGNOSIS — M25562 Pain in left knee: Secondary | ICD-10-CM | POA: Diagnosis not present

## 2017-12-08 DIAGNOSIS — M25612 Stiffness of left shoulder, not elsewhere classified: Secondary | ICD-10-CM | POA: Insufficient documentation

## 2017-12-08 DIAGNOSIS — M25512 Pain in left shoulder: Secondary | ICD-10-CM | POA: Insufficient documentation

## 2017-12-08 DIAGNOSIS — R2681 Unsteadiness on feet: Secondary | ICD-10-CM | POA: Insufficient documentation

## 2017-12-08 DIAGNOSIS — M25561 Pain in right knee: Secondary | ICD-10-CM | POA: Diagnosis not present

## 2017-12-08 DIAGNOSIS — R262 Difficulty in walking, not elsewhere classified: Secondary | ICD-10-CM | POA: Insufficient documentation

## 2017-12-08 NOTE — Therapy (Signed)
[place], [place], [address] Phone: [phone]   Fax:  [phone]  Physical Therapy Evaluation  Patient Details  Name: Kristen Manning MRN: [id number] Date of Birth: June 14, 1932 Referring Provider (PT): Pamella Pert   Encounter Date: 12/08/2017  PT End of Session - 12/08/17 0921    Visit Number  5    Date for PT Re-Evaluation  01/18/18    PT Start Time  0803    PT Stop Time  0277    PT Time Calculation (min)  52 min    Activity Tolerance  Patient limited by pain    Behavior During Therapy  [facility] for tasks assessed/performed       Past Medical History:  Diagnosis Date  . Arthritis   . Bacterial infection    in lungs in Jan 2015  . Bilateral lower extremity edema   . Cancer ([place])    skin cancer, back, legs melonama, sees Dr Lisabeth Pick, derm  . Chronic back pain   . GERD (gastroesophageal reflux disease)    takes Omeprazole daily  . Headache(784.0)   . History of migraine    last one about 15 yrs ago  . Hyperlipidemia    takes Zetia daily  . Hypertension    takes HCTZ daily  . Joint pain   . Joint swelling   . Myocardial infarction [facility])    pt states EKG always shows infarct but no change from previous ekg;never knew anything about it  . Nocturia   . Osteoarthritis   . Pneumonia    hx of-many yrs ago  . Pneumonia   . PONV (postoperative nausea and vomiting)   . Shortness of breath    with exertion  . Urinary frequency   . Urinary incontinence   . Urinary urgency     Past Surgical History:  Procedure Laterality Date  . bladder tack  1998  . BLEPHAROPLASTY Bilateral   . JOINT REPLACEMENT Left 10/02/2009  . JOINT REPLACEMENT Right 02-22-13   Knee  . KNEE SURGERY Bilateral 2009 and 2011  . OOPHORECTOMY  1998   only one  . TONSILLECTOMY  as a child ? date  . TOTAL KNEE ARTHROPLASTY Right 02/22/2013   DR Ronnie Derby  . TOTAL KNEE ARTHROPLASTY Right 02/22/2013   Procedure: TOTAL KNEE  ARTHROPLASTY;  Surgeon: Vickey Huger, MD;  Location: [place];  Service: Orthopedics;  Laterality: Right;    There were no vitals filed for this visit.   Subjective Assessment - 12/08/17 0805    Subjective  Patient reports that she called the MD the other day for [place] to see her for her knne pain, she reports difficulty walking, difficulty with ADL's die to pain in the knees and weakness.  She reports that she was originally referred for some lymphedema, we gave her places to call and go for that treatment since we do not do that at this office    Pertinent History  left TKR 7 years ago, right TKR 4 years ago.  Lymphedema of the LE's    Limitations  Walking;House hold activities    How long can you walk comfortably?  200-300 feet    Patient Stated Goals  more confident in walking, stronger    Currently in Pain?  Yes    Pain Score  2     Pain Location  Knee    Pain Orientation  Right;Left    Pain Descriptors / Indicators  Aching;Sore  Aggravating Factors   walking uneven surfaces, reports she is mostly afraid of walking pain up to 5/10    Pain Relieving Factors  heat and rest    Effect of Pain on Daily Activities  difficulty walking, getting up from sitting, has a fear of falls         [facility] PT Assessment - 12/08/17 0001      Assessment   Medical Diagnosis  bilateral knee pain and difficulty walking    Referring Provider (PT)  Pamella Pert    Onset Date/Surgical Date  12/01/17    Prior Therapy  started for the left shoulder a month aro      Balance Screen   Has the patient fallen in the past 6 months  Yes    How many times?  1    Has the patient had a decrease in activity level because of a fear of falling?   No    Is the patient reluctant to leave their home because of a fear of falling?   No      ROM / Strength   AROM / PROM / Strength  AROM;Strength      AROM   AROM Assessment Site  Knee    Right/Left Shoulder  Right;Left    Right/Left Knee  Right;Left    Right Knee Extension   10    Right Knee Flexion  107    Left Knee Extension  15    Left Knee Flexion  110      Strength   Overall Strength Comments  strength of the knees 3+/5 with some pain and popping, hips 3+/5      Palpation   Palpation comment  some crepitus with active motion, has edema and discoloration in the lower shin area      Ambulation/Gait   Gait Comments  reports that she does not use an assistive device due to fear of it, she "waddles", very small shuffling steps, seems off balance and unsteady      Standardized Balance Assessment   Standardized Balance Assessment  Timed Up and Go Test;Berg Balance Test      Berg Balance Test   Sit to Stand  Able to stand  independently using hands    Standing Unsupported  Able to stand safely 2 minutes    Sitting with Back Unsupported but Feet Supported on Floor or Stool  Able to sit safely and securely 2 minutes    Stand to Sit  Controls descent by using hands    Transfers  Able to transfer safely, definite need of hands    Standing Unsupported with Eyes Closed  Able to stand 10 seconds safely    Standing Ubsupported with Feet Together  Able to place feet together independently but unable to hold for 30 seconds    From Standing, Reach Forward with Outstretched Arm  Can reach forward >5 cm safely (2")    From Standing Position, Pick up Object from Floor  Able to pick up shoe, needs supervision    From Standing Position, Turn to Look Behind Over each Shoulder  Looks behind one side only/other side shows less weight shift    Turn 360 Degrees  Able to turn 360 degrees safely but slowly    Standing Unsupported, Alternately Place Feet on Step/Stool  Able to complete >2 steps/needs minimal assist    Standing Unsupported, One Foot in Front  Needs help to step but can hold 15 seconds    Standing on One Leg  Tries to lift leg/unable to hold 3 seconds but remains standing independently    Total Score  36      Timed Up and Go Test   Normal TUG (seconds)  24                 Objective measurements completed on examination: See above findings.      [place] Adult PT Treatment/Exercise - 12/08/17 0001      Exercises   Exercises  Knee/Hip      Knee/Hip Exercises: Aerobic   Nustep  level 4 x 6 minute      Moist Heat Therapy   Number Minutes Moist Heat  15 Minutes    Moist Heat Location  Shoulder      Electrical Stimulation   Electrical Stimulation Location  left upper arm to the upper trap ant/post shoulder    Electrical Stimulation Action  IFC    Electrical Stimulation Parameters  sitting    Electrical Stimulation Goals  Pain               PT Short Term Goals - 12/02/17 0838      PT SHORT TERM GOAL #1   Title  independent with initial HEP    Status  On-going        PT Long Term Goals - 12/08/17 4503      Additional Long Term Goals   Additional Long Term Goals  Yes      PT LONG TERM GOAL #6   Title  decreaes TUG to 18 seconds    Time  8    Period  Weeks    Status  New      PT LONG TERM GOAL #7   Title  increase Berg balance score to 45/56    Time  8    Period  Weeks    Status  New             Plan - 12/08/17 8882    Clinical Impression Statement  Patient being seen here by PT for her left shoulder pain, reports knee pain for a number of years, reports lymphedema in the LE's, has has bilatera TKA's.  Reports that over the past year she has had 1 fall, reports that she has difficulty getting up from sitting, has a fear of walking, is unsteady with walking.  Her TUG time was 24 seconds, her Berg balance score was 36/56 putting her at a high risk for falls.  Her LE strength is weak especially the hips    Clinical Presentation  Evolving    Clinical Decision Making  Moderate    Rehab Potential  Fair    PT Frequency  2x / week    PT Duration  8 weeks    PT Treatment/Interventions  ADLs/Self Care Home Management;Cryotherapy;Electrical Stimulation;Iontophoresis 4mg /ml Dexamethasone;Moist  Heat;Ultrasound;Therapeutic exercise;Therapeutic activities;Patient/family education;Manual techniques;Dry needling;Gait training;Balance training;Functional mobility training;Stair training    PT Next Visit Plan  add LE strength and gait and balance treatment to the current plan    Consulted and Agree with Plan of Care  Patient       Patient will benefit from skilled therapeutic intervention in order to improve the following deficits and impairments:  Abnormal gait, Decreased coordination, Decreased range of motion, Difficulty walking, Impaired UE functional use, Increased muscle spasms, Pain, Improper body mechanics, Decreased mobility, Decreased strength, Postural dysfunction  Visit Diagnosis: Acute pain of left shoulder - Plan: PT plan of care cert/re-cert  Stiffness of left shoulder, not  elsewhere classified - Plan: PT plan of care cert/re-cert  Chronic pain of left knee - Plan: PT plan of care cert/re-cert  Chronic pain of right knee - Plan: PT plan of care cert/re-cert  Difficulty in walking, not elsewhere classified - Plan: PT plan of care cert/re-cert  Unsteady gait - Plan: PT plan of care cert/re-cert     Problem List Patient Active Problem List   Diagnosis Date Noted  . Muscle spasm 10/28/2016  . DDD (degenerative disc disease), lumbar 10/07/2016  . Primary osteoarthritis of both hands 10/07/2016  . Primary osteoarthritis of both feet 10/07/2016  . Tension-type headache, not intractable 09/16/2016  . GERD (gastroesophageal reflux disease) 09/11/2016  . ANA positive 09/02/2016  . Myalgia 06/26/2016  . Shortness of breath 03/22/2015  . History of total knee replacement, bilateral 03/20/2015  . Hypothyroidism 09/21/2014  . Cephalalgia 09/15/2014  . Gout 06/20/2014  . Lower leg edema 06/13/2014  . Pain in both feet 06/13/2014  . Chest wall pain 04/29/2014  . Meningioma ([place]) 10/07/2013  . Chronic tension-type headache, intractable 10/07/2013  . Pain of right lower  leg 08/11/2013  . Neck pain 07/28/2013  . Swelling of limb 07/07/2013  . Headache(784.0) 07/07/2013  . Depression 06/23/2013  . Insomnia 05/26/2013  . Routine general medical examination at a health care facility 10/17/2011  . HTN (hypertension) 07/03/2011  . Hyperlipidemia 07/03/2011  . Venous insufficiency, peripheral 07/03/2011    Sumner Boast., PT 12/08/2017, 9:28 AM  [place] [address] [facility] [address] [place], [place], [address] Phone: [phone]   Fax:  [phone]  Name: Kristen Manning MRN: [id number] Date of Birth: 08/05/1932

## 2017-12-10 ENCOUNTER — Ambulatory Visit: Payer: Medicare Other | Admitting: Physical Therapy

## 2017-12-12 ENCOUNTER — Ambulatory Visit: Payer: Medicare Other | Admitting: Physical Therapy

## 2017-12-12 DIAGNOSIS — M25562 Pain in left knee: Secondary | ICD-10-CM

## 2017-12-12 DIAGNOSIS — G8929 Other chronic pain: Secondary | ICD-10-CM

## 2017-12-12 DIAGNOSIS — M25612 Stiffness of left shoulder, not elsewhere classified: Secondary | ICD-10-CM | POA: Diagnosis not present

## 2017-12-12 DIAGNOSIS — M25512 Pain in left shoulder: Secondary | ICD-10-CM

## 2017-12-12 DIAGNOSIS — M25561 Pain in right knee: Secondary | ICD-10-CM

## 2017-12-12 DIAGNOSIS — R262 Difficulty in walking, not elsewhere classified: Secondary | ICD-10-CM

## 2017-12-12 DIAGNOSIS — R2681 Unsteadiness on feet: Secondary | ICD-10-CM | POA: Diagnosis not present

## 2017-12-12 NOTE — Therapy (Signed)
[place], [place], [address] Phone: [phone]   Fax:  [phone]  Physical Therapy Treatment  Patient Details  Name: Kristen Manning MRN: [id number] Date of Birth: 07/13/1932 Referring Provider (PT): Pamella Pert   Encounter Date: 12/12/2017  PT End of Session - 12/12/17 0929    Visit Number  6    Date for PT Re-Evaluation  01/18/18    PT Start Time  0845    PT Stop Time  0943    PT Time Calculation (min)  58 min    Activity Tolerance  Patient limited by pain    Behavior During Therapy  [facility] for tasks assessed/performed       Past Medical History:  Diagnosis Date  . Arthritis   . Bacterial infection    in lungs in Jan 2015  . Bilateral lower extremity edema   . Cancer ([place])    skin cancer, back, legs melonama, sees Dr Lisabeth Pick, derm  . Chronic back pain   . GERD (gastroesophageal reflux disease)    takes Omeprazole daily  . Headache(784.0)   . History of migraine    last one about 15 yrs ago  . Hyperlipidemia    takes Zetia daily  . Hypertension    takes HCTZ daily  . Joint pain   . Joint swelling   . Myocardial infarction [facility])    pt states EKG always shows infarct but no change from previous ekg;never knew anything about it  . Nocturia   . Osteoarthritis   . Pneumonia    hx of-many yrs ago  . Pneumonia   . PONV (postoperative nausea and vomiting)   . Shortness of breath    with exertion  . Urinary frequency   . Urinary incontinence   . Urinary urgency     Past Surgical History:  Procedure Laterality Date  . bladder tack  1998  . BLEPHAROPLASTY Bilateral   . JOINT REPLACEMENT Left 10/02/2009  . JOINT REPLACEMENT Right 02-22-13   Knee  . KNEE SURGERY Bilateral 2009 and 2011  . OOPHORECTOMY  1998   only one  . TONSILLECTOMY  as a child ? date  . TOTAL KNEE ARTHROPLASTY Right 02/22/2013   DR Ronnie Derby  . TOTAL KNEE ARTHROPLASTY Right 02/22/2013   Procedure: TOTAL KNEE  ARTHROPLASTY;  Surgeon: Vickey Huger, MD;  Location: [place];  Service: Orthopedics;  Laterality: Right;    There were no vitals filed for this visit.  Subjective Assessment - 12/12/17 0847    Subjective  "Im doing good, was a little sore after last time"    Currently in Pain?  Yes    Pain Score  3     Pain Location  Shoulder    Pain Orientation  Left                       [facility] Adult PT Treatment/Exercise - 12/12/17 0001      Exercises   Exercises  Shoulder      Shoulder Exercises: Seated   Extension  AROM;Strengthening;Both;20 reps;Theraband    Theraband Level (Shoulder Extension)  Level 1 (Yellow)    Retraction  Strengthening;Both;20 reps    Theraband Level (Shoulder Retraction)  Level 1 (Yellow)    Row  Both;20 reps;Theraband    Theraband Level (Shoulder Row)  Level 1 (Yellow)    Horizontal ABduction  AROM;Strengthening;20 reps;Theraband    Theraband Level (Shoulder Horizontal ABduction)  Level 1 (  Yellow)    External Rotation  Both;20 reps;Theraband    Theraband Level (Shoulder External Rotation)  Level 1 (Yellow)    Internal Rotation  Both;20 reps;Theraband    Theraband Level (Shoulder Internal Rotation)  Level 1 (Yellow)    Flexion  AAROM;Left;20 reps;10 reps    Flexion Limitations  with wand      Shoulder Exercises: ROM/Strengthening   UBE (Upper Arm Bike)  L1 x 3 min fwd & 3 back      Modalities   Modalities  Electrical Stimulation;Moist Heat      Moist Heat Therapy   Number Minutes Moist Heat  15 Minutes    Moist Heat Location  Shoulder      Electrical Stimulation   Electrical Stimulation Location  L shoulder    Electrical Stimulation Action  IFC    Electrical Stimulation Parameters  sitting    Electrical Stimulation Goals  Pain      Manual Therapy   Manual Therapy  Passive ROM    Passive ROM  left shoulder PROM and AAROM               PT Short Term Goals - 12/12/17 0933      PT SHORT TERM GOAL #1   Title  independent with  initial HEP    Time  2    Period  Weeks    Status  Achieved        PT Long Term Goals - 12/12/17 0934      PT LONG TERM GOAL #1   Title  decrease pain 25%    Time  8    Period  Weeks    Status  On-going            Plan - 12/12/17 0930    Clinical Impression Statement  Pt tolerated progression of TE well without increased pain. Pt required mon VC to sit up tall while doing sitting exercises. Pt required frequent rest breaks with exercises due to LUE fatigue. Pt required min VC to keep L elbow tucked in tight to side during ER and IR exercises. Pt continues to lack full ROM in L shoulder with tightness as seen during PROM.     Rehab Potential  Fair    PT Frequency  2x / week    PT Duration  8 weeks    PT Treatment/Interventions  ADLs/Self Care Home Management;Cryotherapy;Electrical Stimulation;Iontophoresis 4mg /ml Dexamethasone;Moist Heat;Ultrasound;Therapeutic exercise;Therapeutic activities;Patient/family education;Manual techniques;Dry needling;Gait training;Balance training;Functional mobility training;Stair training    PT Next Visit Plan  add LE strength and gait and balance treatment to the current plan    Consulted and Agree with Plan of Care  Patient       Patient will benefit from skilled therapeutic intervention in order to improve the following deficits and impairments:  Abnormal gait, Decreased coordination, Decreased range of motion, Difficulty walking, Impaired UE functional use, Increased muscle spasms, Pain, Improper body mechanics, Decreased mobility, Decreased strength, Postural dysfunction  Visit Diagnosis: Acute pain of left shoulder  Stiffness of left shoulder, not elsewhere classified  Chronic pain of left knee  Chronic pain of right knee  Difficulty in walking, not elsewhere classified  Unsteady gait     Problem List Patient Active Problem List   Diagnosis Date Noted  . Muscle spasm 10/28/2016  . DDD (degenerative disc disease), lumbar  10/07/2016  . Primary osteoarthritis of both hands 10/07/2016  . Primary osteoarthritis of both feet 10/07/2016  . Tension-type headache, not intractable 09/16/2016  . GERD (  gastroesophageal reflux disease) 09/11/2016  . ANA positive 09/02/2016  . Myalgia 06/26/2016  . Shortness of breath 03/22/2015  . History of total knee replacement, bilateral 03/20/2015  . Hypothyroidism 09/21/2014  . Cephalalgia 09/15/2014  . Gout 06/20/2014  . Lower leg edema 06/13/2014  . Pain in both feet 06/13/2014  . Chest wall pain 04/29/2014  . Meningioma ([place]) 10/07/2013  . Chronic tension-type headache, intractable 10/07/2013  . Pain of right lower leg 08/11/2013  . Neck pain 07/28/2013  . Swelling of limb 07/07/2013  . Headache(784.0) 07/07/2013  . Depression 06/23/2013  . Insomnia 05/26/2013  . Routine general medical examination at a health care facility 10/17/2011  . HTN (hypertension) 07/03/2011  . Hyperlipidemia 07/03/2011  . Venous insufficiency, peripheral 07/03/2011    Howell Rucks, SPTA 12/12/2017, 9:35 AM  [place] [address] [place], [place], [address] Phone: [phone]   Fax:  [phone]  Name: Kristen Manning MRN: [id number] Date of Birth: 1932-12-31

## 2017-12-13 ENCOUNTER — Ambulatory Visit (INDEPENDENT_AMBULATORY_CARE_PROVIDER_SITE_OTHER): Payer: Medicare Other | Admitting: Family Medicine

## 2017-12-13 ENCOUNTER — Other Ambulatory Visit: Payer: Self-pay

## 2017-12-13 ENCOUNTER — Encounter: Payer: Self-pay | Admitting: Family Medicine

## 2017-12-13 VITALS — BP 148/82 | HR 78 | Temp 97.4°F | Resp 16 | Ht 63.0 in | Wt 221.4 lb

## 2017-12-13 DIAGNOSIS — I10 Essential (primary) hypertension: Secondary | ICD-10-CM | POA: Diagnosis not present

## 2017-12-13 DIAGNOSIS — J018 Other acute sinusitis: Secondary | ICD-10-CM

## 2017-12-13 DIAGNOSIS — M7502 Adhesive capsulitis of left shoulder: Secondary | ICD-10-CM

## 2017-12-13 DIAGNOSIS — G8929 Other chronic pain: Secondary | ICD-10-CM | POA: Diagnosis not present

## 2017-12-13 DIAGNOSIS — M25512 Pain in left shoulder: Secondary | ICD-10-CM | POA: Diagnosis not present

## 2017-12-13 DIAGNOSIS — J329 Chronic sinusitis, unspecified: Secondary | ICD-10-CM

## 2017-12-13 MED ORDER — PREDNISONE 10 MG PO TABS: 40.0000 mg | ORAL_TABLET | Freq: Every day | ORAL | 0 refills | Status: AC

## 2017-12-13 NOTE — Patient Instructions (Addendum)
Take Prednisone daily with food for the next 5 days Since you recently completed antibiotic would just recommend the steroid and nasal sprays to help with the symptoms. If you develop a fever give [place] a call.     If you have lab work done today you will be contacted with your lab results within the next 2 weeks.  If you have not heard from [place] then please contact us. The fastest way to get your results is to register for My Chart.   IF you received an x-ray today, you will receive an invoice from [facility]. Please contact [facility] at [phone] with questions or concerns regarding your invoice.   IF you received labwork today, you will receive an invoice from [facility]. Please contact [facility] at [phone] with questions or concerns regarding your invoice.   Our billing staff will not be able to assist you with questions regarding bills from these companies.  You will be contacted with the lab results as soon as they are available. The fastest way to get your results is to activate your My Chart account. Instructions are located on the last page of this paperwork. If you have not heard from [place] regarding the results in 2 weeks, please contact this office.

## 2017-12-13 NOTE — Progress Notes (Signed)
Established Patient Office Visit  Subjective:  Patient ID: Kristen Manning, female    DOB: [birth year]  Age: 84 y.o. MRN: [id number]  CC:  Chief Complaint  Patient presents with  . Follow-up    left shoulder pain- pain has been much better since starting PT but still not able to lift shoulder but so high  . Sinusitis    facial pain , heachache, yellow/green mucus, little to no cough, some throat from the drainage, left ear pain x3 days    HPI Kristen Manning presents for   Sinusitis  Patient reports that she has chronic sinusitis She states that she gets frequent bouts with it She tries to take care of it at home She has a sore throat from drainage, a pounding headache behind her eyes with facial pain No fevers  She tried heat applied to the face She also uses claritin and a nasal spray She was prescribed augmentin mid November which she completed.   Left Shoulder She reports that she has left shoulder pain and is going to physical therapy which is helping her pain but she still has some difficulty with abduction of the arm She reports that she is using TENS units and heat as well and would like to get a TENS units prescription She has arthritis and her therapist told her that this would help and her insurance would cover it   Hypertension: Patient here for follow-up of elevated blood pressure. She is not exercising and is adherent to low salt diet.  Blood pressure is well controlled at home. Cardiac symptoms none. Patient denies chest pressure/discomfort, dyspnea, exertional chest pressure/discomfort, irregular heart beat and lower extremity edema.  Cardiovascular risk factors: hypertension, obesity (BMI >= 30 kg/m2) and sedentary lifestyle. Use of agents associated with hypertension: none. History of target organ damage: none.  BP Readings from Last 3 Encounters:  12/13/17 (!) 148/82  11/17/17 138/82  10/21/17 112/84      Past Medical History:  Diagnosis Date  .  Arthritis   . Bacterial infection    in lungs in Jan 2015  . Bilateral lower extremity edema   . Cancer ([place])    skin cancer, back, legs melonama, sees Dr Lisabeth Pick, derm  . Chronic back pain   . GERD (gastroesophageal reflux disease)    takes Omeprazole daily  . Headache(784.0)   . History of migraine    last one about 15 yrs ago  . Hyperlipidemia    takes Zetia daily  . Hypertension    takes HCTZ daily  . Joint pain   . Joint swelling   . Myocardial infarction [facility])    pt states EKG always shows infarct but no change from previous ekg;never knew anything about it  . Nocturia   . Osteoarthritis   . Pneumonia    hx of-many yrs ago  . Pneumonia   . PONV (postoperative nausea and vomiting)   . Shortness of breath    with exertion  . Urinary frequency   . Urinary incontinence   . Urinary urgency     Past Surgical History:  Procedure Laterality Date  . bladder tack  1998  . BLEPHAROPLASTY Bilateral   . JOINT REPLACEMENT Left 10/02/2009  . JOINT REPLACEMENT Right 02-22-13   Knee  . KNEE SURGERY Bilateral 2009 and 2011  . OOPHORECTOMY  1998   only one  . TONSILLECTOMY  as a child ? date  . TOTAL KNEE ARTHROPLASTY Right 02/22/2013   DR Ronnie Derby  .  TOTAL KNEE ARTHROPLASTY Right 02/22/2013   Procedure: TOTAL KNEE ARTHROPLASTY;  Surgeon: Vickey Huger, MD;  Location: [facility];  Service: Orthopedics;  Laterality: Right;    Family History  Problem Relation Age of Onset  . Kidney disease Mother   . Heart disease Mother   . Hypertension Mother   . Varicose Veins Mother   . Kidney failure Mother   . Alcohol abuse Father   . Stroke Father        several   . Hypertension Father   . Varicose Veins Father   . Alcohol abuse Brother   . Hyperlipidemia Brother   . Hypertension Brother   . Early death Brother   . Varicose Veins Brother   . Heart disease Brother   . Hypertension Brother   . Arthritis Brother        Gout  . Varicose Veins Son   . Deep vein thrombosis Son   .  Breast cancer Maternal Aunt     Social History   Socioeconomic History  . Marital status: Widowed    Spouse name: Not on file  . Number of children: 4  . Years of education: Not on file  . Highest education level: Not on file  Occupational History  . Not on file  Social Needs  . Financial resource strain: Not on file  . Food insecurity:    Worry: Not on file    Inability: Not on file  . Transportation needs:    Medical: Not on file    Non-medical: Not on file  Tobacco Use  . Smoking status: Never Smoker  . Smokeless tobacco: Never Used  Substance and Sexual Activity  . Alcohol use: No  . Drug use: No  . Sexual activity: Not on file  Lifestyle  . Physical activity:    Days per week: Not on file    Minutes per session: Not on file  . Stress: Not on file  Relationships  . Social connections:    Talks on phone: Not on file    Gets together: Not on file    Attends religious service: Not on file    Active member of club or organization: Not on file    Attends meetings of clubs or organizations: Not on file    Relationship status: Not on file  . Intimate partner violence:    Fear of current or ex partner: Not on file    Emotionally abused: Not on file    Physically abused: Not on file    Forced sexual activity: Not on file  Other Topics Concern  . Not on file  Social History Narrative  . Not on file    Outpatient Medications Prior to Visit  Medication Sig Dispense Refill  . acetaminophen (TYLENOL) 500 MG tablet Take 1 tablet (500 mg total) by mouth every 8 (eight) hours as needed for moderate pain. 90 tablet 1  . allopurinol (ZYLOPRIM) 300 MG tablet Take 300 mg by mouth daily.  0  . azelastine (ASTELIN) 0.1 % nasal spray Place 2 sprays into both nostrils 2 (two) times daily. Use in each nostril as directed 30 mL 0  . Calcium 600-200 MG-UNIT tablet Take 1 tablet by mouth daily.    [name] co-enzyme Q-10 30 MG capsule Take 30 mg by mouth daily.    . diclofenac sodium  (VOLTAREN) 1 % GEL Apply 2 g topically 4 (four) times daily. 100 g 1  . ezetimibe (ZETIA) 10 MG tablet TAKE 1 TABLET BY  MOUTH ONCE DAILY 90 tablet 0  . fenofibrate 160 MG tablet TAKE 1 TABLET BY MOUTH ONCE DAILY 90 tablet 3  . furosemide (LASIX) 40 MG tablet Take 1 tablet (40 mg total) by mouth daily. 90 tablet 3  . levothyroxine (SYNTHROID, LEVOTHROID) 50 MCG tablet TAKE 1 TABLET BY MOUTH ONCE DAILY 210 tablet 0  . loratadine (CLARITIN) 10 MG tablet Take 1 tablet (10 mg total) by mouth daily.    . NON FORMULARY Take 1 tablet by mouth daily.    [name] omeprazole (PRILOSEC) 20 MG capsule Take 20 mg by mouth daily.    . pregabalin (LYRICA) 50 MG capsule Take 1 capsule (50 mg total) by mouth 3 (three) times daily. 60 capsule 3  . triamcinolone (NASACORT) 55 MCG/ACT AERO nasal inhaler Place 2 sprays into the nose daily. 1 Inhaler 12  . triamcinolone cream (KENALOG) 0.1 % Apply 1 application topically 2 (two) times daily. For two weeks as needed 45 g 0  . amoxicillin-clavulanate (AUGMENTIN) 875-125 MG tablet Take 1 tablet by mouth 2 (two) times daily. 20 tablet 0   No facility-administered medications prior to visit.     Allergies  Allergen Reactions  . Aspirin Other (See Comments)    Noted caused 2 holes in retina, not sure   . Codeine Hives  . Tizanidine Hcl Other (See Comments)    Confusion    ROS Review of Systems  Review of Systems  Constitutional: Negative for activity change, appetite change, chills and fever.  HENT: see hpi Respiratory: see hpi Gastrointestinal: Negative for diarrhea, nausea and vomiting.  Genitourinary: Negative for difficulty urinating, dysuria, flank pain and hematuria.  Musculoskeletal: Negative for back pain, joint swelling and neck pain.  Neurological: Negative for dizziness, speech difficulty, light-headedness and numbness.  See HPI. All other review of systems negative.     Objective:    Physical Exam  BP (!) 148/82 (BP Location: Right Arm, Patient  Position: Sitting, Cuff Size: Large)   Pulse 78   Temp (!) 97.4 F (36.3 C) (Oral)   Resp 16   Ht 5\' 3"  (1.6 m)   Wt 221 lb 6.4 oz (100.4 kg)   SpO2 95%   BMI 39.22 kg/m  Wt Readings from Last 3 Encounters:  12/13/17 221 lb 6.4 oz (100.4 kg)  11/17/17 215 lb 12.8 oz (97.9 kg)  10/21/17 209 lb 9.6 oz (95.1 kg)   General: alert, oriented, in NAD Head: normocephalic, atraumatic, + frontal sinus tenderness Eyes: EOM intact, no scleral icterus or conjunctival injection Ears: TM clear bilaterally Nose: mucosa nonerythematous, nonedematous Throat: no pharyngeal exudate or erythema Lymph: no posterior auricular, submental or cervical lymph adenopathy Heart: normal rate, normal sinus rhythm, no murmurs Lungs: clear to auscultation bilaterally, no wheezing  Shoulder - left with decreased range of motion, tender over the ac joint   There are no preventive care reminders to display for this patient.  There are no preventive care reminders to display for this patient.  Lab Results  Component Value Date   TSH 3.32 08/01/2017   Lab Results  Component Value Date   WBC 9.2 08/01/2017   HGB 13.1 08/01/2017   HCT 38.7 08/01/2017   MCV 91.0 08/01/2017   PLT 357.0 08/01/2017   Lab Results  Component Value Date   NA 142 11/17/2017   K 4.9 11/17/2017   CO2 20 11/17/2017   GLUCOSE 78 11/17/2017   BUN 23 11/17/2017   CREATININE 1.25 (H) 11/17/2017   BILITOT 0.7 08/01/2017  ALKPHOS 84 08/01/2017   AST 20 08/01/2017   ALT 13 08/01/2017   PROT 7.9 08/01/2017   ALBUMIN 4.3 08/01/2017   CALCIUM 9.5 11/17/2017   ANIONGAP 7 10/20/2016   GFR 36.67 (L) 08/01/2017   Lab Results  Component Value Date   CHOL 175 08/01/2017   Lab Results  Component Value Date   HDL 65.50 08/01/2017   Lab Results  Component Value Date   LDLCALC 93 08/01/2017   Lab Results  Component Value Date   TRIG 80.0 08/01/2017   Lab Results  Component Value Date   CHOLHDL 3 08/01/2017   No results  found for: HGBA1C    Assessment & Plan:   Problem List Items Addressed This Visit    None    Visit Diagnoses    Chronic left shoulder pain    -  Primary   Relevant Medications   predniSONE (DELTASONE) 10 MG tablet   Other Relevant Orders   Tens unit   Adhesive capsulitis of left shoulder       Relevant Medications   predniSONE (DELTASONE) 10 MG tablet   Other Relevant Orders   Tens unit   Recurrent sinusitis       Relevant Medications   predniSONE (DELTASONE) 10 MG tablet      Meds ordered this encounter  Medications  . predniSONE (DELTASONE) 10 MG tablet    Sig: Take 4 tablets (40 mg total) by mouth daily with breakfast for 5 days.    Dispense:  20 tablet    Refill:  0   Placida was seen today for follow-up and sinusitis.  Diagnoses and all orders for this visit:  Chronic left shoulder pain -     Tens unit  Adhesive capsulitis of left shoulder- ordered TENS units Continue PT  Tylenol for pain -     Tens unit  Recurrent sinusitis- advised pt to take prednisone to improve congestion and reduce sinus inflammation No abx at this time Continue nasal steroid -     predniSONE (DELTASONE) 10 MG tablet; Take 4 tablets (40 mg total) by mouth daily with breakfast for 5 days.  Essential hypertension - Patient's blood pressure is at goal of 139/89 or less. Condition is stable. Continue current medications and treatment plan. I recommend that you exercise for 30-45 minutes 5 days a week. I also recommend a balanced diet with fruits and vegetables every day, lean meats, and little fried foods. The DASH diet (you can find this online) is a good example of this.    Follow-up: No follow-ups on file.    Forrest Moron, MD

## 2017-12-15 ENCOUNTER — Ambulatory Visit: Payer: Medicare Other | Admitting: Physical Therapy

## 2017-12-18 ENCOUNTER — Encounter: Payer: Self-pay | Admitting: Physical Therapy

## 2017-12-18 ENCOUNTER — Ambulatory Visit: Payer: Medicare Other | Admitting: Physical Therapy

## 2017-12-18 DIAGNOSIS — R262 Difficulty in walking, not elsewhere classified: Secondary | ICD-10-CM | POA: Diagnosis not present

## 2017-12-18 DIAGNOSIS — M25562 Pain in left knee: Secondary | ICD-10-CM

## 2017-12-18 DIAGNOSIS — M25561 Pain in right knee: Secondary | ICD-10-CM

## 2017-12-18 DIAGNOSIS — G8929 Other chronic pain: Secondary | ICD-10-CM

## 2017-12-18 DIAGNOSIS — M25612 Stiffness of left shoulder, not elsewhere classified: Secondary | ICD-10-CM | POA: Diagnosis not present

## 2017-12-18 DIAGNOSIS — R2681 Unsteadiness on feet: Secondary | ICD-10-CM

## 2017-12-18 DIAGNOSIS — M25512 Pain in left shoulder: Secondary | ICD-10-CM | POA: Diagnosis not present

## 2017-12-18 NOTE — Therapy (Signed)
[facility] [address] [place], [place], [address] Phone: [phone]   Fax:  [phone]  Physical Therapy Treatment  Patient Details  Name: Kristen Manning MRN: [id number] Date of Birth: Oct 24, 1932 Referring Provider (PT): Pamella Pert   Encounter Date: 12/18/2017  PT End of Session - 12/18/17 1002    Visit Number  7    Date for PT Re-Evaluation  01/18/18    PT Start Time  0930    PT Stop Time  1030    PT Time Calculation (min)  60 min    Activity Tolerance  Patient limited by pain    Behavior During Therapy  [employer] for tasks assessed/performed       Past Medical History:  Diagnosis Date  . Arthritis   . Bacterial infection    in lungs in Jan 2015  . Bilateral lower extremity edema   . Cancer ([place])    skin cancer, back, legs melonama, sees Dr Lisabeth Pick, derm  . Chronic back pain   . GERD (gastroesophageal reflux disease)    takes Omeprazole daily  . Headache(784.0)   . History of migraine    last one about 15 yrs ago  . Hyperlipidemia    takes Zetia daily  . Hypertension    takes HCTZ daily  . Joint pain   . Joint swelling   . Myocardial infarction [facility])    pt states EKG always shows infarct but no change from previous ekg;never knew anything about it  . Nocturia   . Osteoarthritis   . Pneumonia    hx of-many yrs ago  . Pneumonia   . PONV (postoperative nausea and vomiting)   . Shortness of breath    with exertion  . Urinary frequency   . Urinary incontinence   . Urinary urgency     Past Surgical History:  Procedure Laterality Date  . bladder tack  1998  . BLEPHAROPLASTY Bilateral   . JOINT REPLACEMENT Left 10/02/2009  . JOINT REPLACEMENT Right 02-22-13   Knee  . KNEE SURGERY Bilateral 2009 and 2011  . OOPHORECTOMY  1998   only one  . TONSILLECTOMY  as a child ? date  . TOTAL KNEE ARTHROPLASTY Right 02/22/2013   DR Ronnie Derby  . TOTAL KNEE ARTHROPLASTY Right 02/22/2013   Procedure: TOTAL KNEE  ARTHROPLASTY;  Surgeon: Vickey Huger, MD;  Location: [facility];  Service: Orthopedics;  Laterality: Right;    There were no vitals filed for this visit.  Subjective Assessment - 12/18/17 0955    Subjective  My leg mms were sore    Currently in Pain?  Yes    Pain Score  3     Pain Location  Shoulder   knees   Aggravating Factors   walking                       [facility] Adult PT Treatment/Exercise - 12/18/17 0001      Knee/Hip Exercises: Aerobic   Nustep  level 4 x 6 minute      Knee/Hip Exercises: Seated   Long Arc Quad  Both;2 sets;10 reps;Weights    Long Arc Quad Weight  2 lbs.    Marching  Both;2 sets;10 reps;Weights    Marching Weights  2 lbs.    Hamstring Curl  Both;2 sets;10 reps    Hamstring Limitations  red tband      Shoulder Exercises: Seated   Row  Both;20 reps;Theraband  Theraband Level (Shoulder Row)  Level 2 (Red)    Horizontal ABduction  AROM;Strengthening;20 reps;Theraband    Theraband Level (Shoulder Horizontal ABduction)  Level 2 (Red)    External Rotation  Both;20 reps;Theraband    Theraband Level (Shoulder External Rotation)  Level 1 (Yellow)    Internal Rotation  Both;20 reps;Theraband    Theraband Level (Shoulder Internal Rotation)  Level 1 (Yellow)      Shoulder Exercises: ROM/Strengthening   UBE (Upper Arm Bike)  L1 x 3 min fwd & 3 back      Modalities   Modalities  Electrical Stimulation;Moist Heat      Moist Heat Therapy   Number Minutes Moist Heat  15 Minutes    Moist Heat Location  Shoulder;Knee      Electrical Stimulation   Electrical Stimulation Location  L shoulder    Electrical Stimulation Action  IFC    Electrical Stimulation Parameters  sitting    Electrical Stimulation Goals  Pain               PT Short Term Goals - 12/12/17 0933      PT SHORT TERM GOAL #1   Title  independent with initial HEP    Time  2    Period  Weeks    Status  Achieved        PT Long Term Goals - 12/12/17 0934      PT LONG  TERM GOAL #1   Title  decrease pain 25%    Time  8    Period  Weeks    Status  On-going            Plan - 12/18/17 1002    Clinical Impression Statement  Patient giving good effort with the exercises, she is very weak in the legs and the arms, needing a lot of cues to decreased compensation    PT Next Visit Plan  add LE strength and gait and balance treatment to the current plan    Consulted and Agree with Plan of Care  Patient       Patient will benefit from skilled therapeutic intervention in order to improve the following deficits and impairments:  Abnormal gait, Decreased coordination, Decreased range of motion, Difficulty walking, Impaired UE functional use, Increased muscle spasms, Pain, Improper body mechanics, Decreased mobility, Decreased strength, Postural dysfunction  Visit Diagnosis: Acute pain of left shoulder  Stiffness of left shoulder, not elsewhere classified  Chronic pain of left knee  Chronic pain of right knee  Difficulty in walking, not elsewhere classified  Unsteady gait     Problem List Patient Active Problem List   Diagnosis Date Noted  . Muscle spasm 10/28/2016  . DDD (degenerative disc disease), lumbar 10/07/2016  . Primary osteoarthritis of both hands 10/07/2016  . Primary osteoarthritis of both feet 10/07/2016  . Tension-type headache, not intractable 09/16/2016  . GERD (gastroesophageal reflux disease) 09/11/2016  . ANA positive 09/02/2016  . Myalgia 06/26/2016  . Shortness of breath 03/22/2015  . History of total knee replacement, bilateral 03/20/2015  . Hypothyroidism 09/21/2014  . Cephalalgia 09/15/2014  . Gout 06/20/2014  . Lower leg edema 06/13/2014  . Pain in both feet 06/13/2014  . Chest wall pain 04/29/2014  . Meningioma ([place]) 10/07/2013  . Chronic tension-type headache, intractable 10/07/2013  . Pain of right lower leg 08/11/2013  . Neck pain 07/28/2013  . Swelling of limb 07/07/2013  . Headache(784.0) 07/07/2013  .  Depression 06/23/2013  . Insomnia 05/26/2013  .  Routine general medical examination at a health care facility 10/17/2011  . HTN (hypertension) 07/03/2011  . Hyperlipidemia 07/03/2011  . Venous insufficiency, peripheral 07/03/2011    Sumner Boast., PT 12/18/2017, 10:08 AM  [address] [place], [place], [address] Phone: [phone]   Fax:  [phone]  Name: JOHNEISHA BROADEN MRN: [id number] Date of Birth: 06-29-1932

## 2017-12-22 ENCOUNTER — Ambulatory Visit: Payer: Medicare Other | Admitting: Physical Therapy

## 2017-12-22 DIAGNOSIS — M25512 Pain in left shoulder: Secondary | ICD-10-CM

## 2017-12-22 DIAGNOSIS — M25561 Pain in right knee: Secondary | ICD-10-CM | POA: Diagnosis not present

## 2017-12-22 DIAGNOSIS — M25612 Stiffness of left shoulder, not elsewhere classified: Secondary | ICD-10-CM | POA: Diagnosis not present

## 2017-12-22 DIAGNOSIS — R2681 Unsteadiness on feet: Secondary | ICD-10-CM | POA: Diagnosis not present

## 2017-12-22 DIAGNOSIS — R262 Difficulty in walking, not elsewhere classified: Secondary | ICD-10-CM | POA: Diagnosis not present

## 2017-12-22 DIAGNOSIS — G8929 Other chronic pain: Secondary | ICD-10-CM

## 2017-12-22 DIAGNOSIS — M25562 Pain in left knee: Secondary | ICD-10-CM | POA: Diagnosis not present

## 2017-12-22 NOTE — Therapy (Signed)
[place], [place], [address] Phone: [phone]   Fax:  [phone]  Physical Therapy Treatment  Patient Details  Name: Kristen Manning MRN: [id number] Date of Birth: Nov 19, 1932 Referring Provider (PT): Pamella Pert   Encounter Date: 12/22/2017  PT End of Session - 12/22/17 0932    Visit Number  8    Date for PT Re-Evaluation  01/18/18    PT Start Time  0930    PT Stop Time  1027    PT Time Calculation (min)  57 min    Activity Tolerance  Patient limited by pain    Behavior During Therapy  [facility] for tasks assessed/performed       Past Medical History:  Diagnosis Date  . Arthritis   . Bacterial infection    in lungs in Jan 2015  . Bilateral lower extremity edema   . Cancer ([place])    skin cancer, back, legs melonama, sees Dr Lisabeth Pick, derm  . Chronic back pain   . GERD (gastroesophageal reflux disease)    takes Omeprazole daily  . Headache(784.0)   . History of migraine    last one about 15 yrs ago  . Hyperlipidemia    takes Zetia daily  . Hypertension    takes HCTZ daily  . Joint pain   . Joint swelling   . Myocardial infarction [facility])    pt states EKG always shows infarct but no change from previous ekg;never knew anything about it  . Nocturia   . Osteoarthritis   . Pneumonia    hx of-many yrs ago  . Pneumonia   . PONV (postoperative nausea and vomiting)   . Shortness of breath    with exertion  . Urinary frequency   . Urinary incontinence   . Urinary urgency     Past Surgical History:  Procedure Laterality Date  . bladder tack  1998  . BLEPHAROPLASTY Bilateral   . JOINT REPLACEMENT Left 10/02/2009  . JOINT REPLACEMENT Right 02-22-13   Knee  . KNEE SURGERY Bilateral 2009 and 2011  . OOPHORECTOMY  1998   only one  . TONSILLECTOMY  as a child ? date  . TOTAL KNEE ARTHROPLASTY Right 02/22/2013   DR Ronnie Derby  . TOTAL KNEE ARTHROPLASTY Right 02/22/2013   Procedure: TOTAL KNEE  ARTHROPLASTY;  Surgeon: Vickey Huger, MD;  Location: [facility];  Service: Orthopedics;  Laterality: Right;    There were no vitals filed for this visit.  Subjective Assessment - 12/22/17 0932    Subjective  My shoulder is doing better    Pertinent History  left TKR 7 years ago, right TKR 4 years ago.  Lymphedema of the LE's    Limitations  Walking;House hold activities    How long can you walk comfortably?  200-300 feet    Patient Stated Goals  more confident in walking, stronger    Currently in Pain?  Yes    Pain Score  3     Pain Location  Shoulder    Pain Orientation  Left    Pain Descriptors / Indicators  Aching;Sore    Pain Type  Acute pain    Pain Onset  More than a month ago                       [facility] Adult PT Treatment/Exercise - 12/22/17 0001      Knee/Hip Exercises: Aerobic   Nustep  level 4 x  6 minute      Knee/Hip Exercises: Seated   Long Arc Quad  Both;2 sets;10 reps;Weights    Long Arc Quad Weight  2 lbs.    Marching  Both;Weights;4 sets;5 reps    Marching Weights  2 lbs.    Hamstring Curl  Both;2 sets;10 reps    Hamstring Limitations  red tband      Shoulder Exercises: Seated   Row  Both;20 reps;Theraband    Theraband Level (Shoulder Row)  Level 2 (Red)    Horizontal ABduction  Strengthening;Right;Left;20 reps;Theraband   left arm static isometric hold   Theraband Level (Shoulder Horizontal ABduction)  Level 1 (Yellow)    External Rotation  Both;20 reps;Theraband    Theraband Level (Shoulder External Rotation)  Level 1 (Yellow)    Internal Rotation  Both;20 reps;Theraband    Theraband Level (Shoulder Internal Rotation)  Level 1 (Yellow)    Other Seated Exercises  robber x 10      Shoulder Exercises: ROM/Strengthening   UBE (Upper Arm Bike)  L3 x 3 min fwd & 3 back      Modalities   Modalities  Electrical Stimulation;Moist Heat      Moist Heat Therapy   Number Minutes Moist Heat  15 Minutes    Moist Heat Location  Shoulder;Knee       Electrical Stimulation   Electrical Stimulation Location  L shoulder    Electrical Stimulation Action  pre mod    Electrical Stimulation Parameters  sitting    Electrical Stimulation Goals  Pain               PT Short Term Goals - 12/12/17 0933      PT SHORT TERM GOAL #1   Title  independent with initial HEP    Time  2    Period  Weeks    Status  Achieved        PT Long Term Goals - 12/22/17 0941      PT LONG TERM GOAL #1   Title  decrease pain 25%    Baseline  patient reports 50% improvement in pain    Time  8    Period  Weeks    Status  Achieved      PT LONG TERM GOAL #2   Title  increase left shoulder flexion to 100 degrees    Baseline  active 80 deg in sitting    Time  8    Period  Weeks    Status  On-going            Plan - 12/22/17 1013    Clinical Impression Statement  Patient reports improvement in her left shoulder pain by 50% but still has significant flexion limitations and weakness throughout.     PT Treatment/Interventions  ADLs/Self Care Home Management;Cryotherapy;Electrical Stimulation;Iontophoresis 4mg /ml Dexamethasone;Moist Heat;Ultrasound;Therapeutic exercise;Therapeutic activities;Patient/family education;Manual techniques;Dry needling;Gait training;Balance training;Functional mobility training;Stair training    PT Next Visit Plan  add LE strength and gait and balance treatment to the current plan       Patient will benefit from skilled therapeutic intervention in order to improve the following deficits and impairments:  Abnormal gait, Decreased coordination, Decreased range of motion, Difficulty walking, Impaired UE functional use, Increased muscle spasms, Pain, Improper body mechanics, Decreased mobility, Decreased strength, Postural dysfunction  Visit Diagnosis: Acute pain of left shoulder  Stiffness of left shoulder, not elsewhere classified  Chronic pain of left knee  Chronic pain of right knee  Difficulty in walking, not  elsewhere classified     Problem List Patient Active Problem List   Diagnosis Date Noted  . Muscle spasm 10/28/2016  . DDD (degenerative disc disease), lumbar 10/07/2016  . Primary osteoarthritis of both hands 10/07/2016  . Primary osteoarthritis of both feet 10/07/2016  . Tension-type headache, not intractable 09/16/2016  . GERD (gastroesophageal reflux disease) 09/11/2016  . ANA positive 09/02/2016  . Myalgia 06/26/2016  . Shortness of breath 03/22/2015  . History of total knee replacement, bilateral 03/20/2015  . Hypothyroidism 09/21/2014  . Cephalalgia 09/15/2014  . Gout 06/20/2014  . Lower leg edema 06/13/2014  . Pain in both feet 06/13/2014  . Chest wall pain 04/29/2014  . Meningioma ([place]) 10/07/2013  . Chronic tension-type headache, intractable 10/07/2013  . Pain of right lower leg 08/11/2013  . Neck pain 07/28/2013  . Swelling of limb 07/07/2013  . Headache(784.0) 07/07/2013  . Depression 06/23/2013  . Insomnia 05/26/2013  . Routine general medical examination at a health care facility 10/17/2011  . HTN (hypertension) 07/03/2011  . Hyperlipidemia 07/03/2011  . Venous insufficiency, peripheral 07/03/2011    Madelyn Flavors PT 12/22/2017, 12:54 PM  [place] [address] [place], [place], [address] Phone: [phone]   Fax:  [phone]  Name: Kristen Manning MRN: [id number] Date of Birth: [birth year]

## 2017-12-25 ENCOUNTER — Encounter: Payer: Self-pay | Admitting: Physical Therapy

## 2017-12-25 ENCOUNTER — Ambulatory Visit: Payer: Medicare Other | Admitting: Physical Therapy

## 2017-12-25 DIAGNOSIS — R262 Difficulty in walking, not elsewhere classified: Secondary | ICD-10-CM | POA: Diagnosis not present

## 2017-12-25 DIAGNOSIS — M25612 Stiffness of left shoulder, not elsewhere classified: Secondary | ICD-10-CM | POA: Diagnosis not present

## 2017-12-25 DIAGNOSIS — M25512 Pain in left shoulder: Secondary | ICD-10-CM

## 2017-12-25 DIAGNOSIS — G8929 Other chronic pain: Secondary | ICD-10-CM | POA: Diagnosis not present

## 2017-12-25 DIAGNOSIS — R2681 Unsteadiness on feet: Secondary | ICD-10-CM | POA: Diagnosis not present

## 2017-12-25 DIAGNOSIS — M25561 Pain in right knee: Secondary | ICD-10-CM | POA: Diagnosis not present

## 2017-12-25 DIAGNOSIS — M25562 Pain in left knee: Secondary | ICD-10-CM | POA: Diagnosis not present

## 2017-12-25 NOTE — Therapy (Signed)
[place], [place], [address] Phone: [phone]   Fax:  [phone]  Physical Therapy Treatment  Patient Details  Name: Kristen Manning MRN: [id number] Date of Birth: Nov 27, 1932 Referring Provider (PT): Pamella Pert   Encounter Date: 12/25/2017  PT End of Session - 12/25/17 1003    Visit Number  9    Date for PT Re-Evaluation  01/18/18    PT Start Time  0930    PT Stop Time  1016    PT Time Calculation (min)  46 min    Activity Tolerance  Patient tolerated treatment well    Behavior During Therapy  [facility] for tasks assessed/performed       Past Medical History:  Diagnosis Date  . Arthritis   . Bacterial infection    in lungs in Jan 2015  . Bilateral lower extremity edema   . Cancer ([place])    skin cancer, back, legs melonama, sees Dr Lisabeth Pick, derm  . Chronic back pain   . GERD (gastroesophageal reflux disease)    takes Omeprazole daily  . Headache(784.0)   . History of migraine    last one about 15 yrs ago  . Hyperlipidemia    takes Zetia daily  . Hypertension    takes HCTZ daily  . Joint pain   . Joint swelling   . Myocardial infarction [facility])    pt states EKG always shows infarct but no change from previous ekg;never knew anything about it  . Nocturia   . Osteoarthritis   . Pneumonia    hx of-many yrs ago  . Pneumonia   . PONV (postoperative nausea and vomiting)   . Shortness of breath    with exertion  . Urinary frequency   . Urinary incontinence   . Urinary urgency     Past Surgical History:  Procedure Laterality Date  . bladder tack  1998  . BLEPHAROPLASTY Bilateral   . JOINT REPLACEMENT Left 10/02/2009  . JOINT REPLACEMENT Right 02-22-13   Knee  . KNEE SURGERY Bilateral 2009 and 2011  . OOPHORECTOMY  1998   only one  . TONSILLECTOMY  as a child ? date  . TOTAL KNEE ARTHROPLASTY Right 02/22/2013   DR Ronnie Derby  . TOTAL KNEE ARTHROPLASTY Right 02/22/2013   Procedure:  TOTAL KNEE ARTHROPLASTY;  Surgeon: Vickey Huger, MD;  Location: [facility];  Service: Orthopedics;  Laterality: Right;    There were no vitals filed for this visit.  Subjective Assessment - 12/25/17 0930    Subjective  Pt stated that her shoulder feels better since she has started here    Currently in Pain?  Yes    Pain Score  2     Pain Location  Shoulder    Pain Orientation  Left    Pain Descriptors / Indicators  Nagging                       [facility] Adult PT Treatment/Exercise - 12/25/17 0001      Shoulder Exercises: Seated   Row  Both;20 reps;Theraband    Theraband Level (Shoulder Row)  Level 2 (Red)    Horizontal ABduction  Strengthening;Right;Left;20 reps;Theraband    Theraband Level (Shoulder Horizontal ABduction)  Level 1 (Yellow)    External Rotation  Both;20 reps;Theraband    Theraband Level (Shoulder External Rotation)  Level 1 (Yellow)    Internal Rotation  Both;20 reps;Theraband    Theraband Level (Shoulder Internal  Rotation)  Level 1 (Yellow)    Other Seated Exercises  bicep curls 2lb 2x10     Other Seated Exercises  Flex and abd 2x10 each       Shoulder Exercises: ROM/Strengthening   UBE (Upper Arm Bike)  L1 x 3 min fwd & 3 back      Modalities   Modalities  Electrical Stimulation;Moist Heat      Moist Heat Therapy   Number Minutes Moist Heat  10 Minutes    Moist Heat Location  Shoulder      Electrical Stimulation   Electrical Stimulation Location  L shoulder    Electrical Stimulation Action  pre mod    Electrical Stimulation Parameters  sitting    Electrical Stimulation Goals  Pain               PT Short Term Goals - 12/12/17 0933      PT SHORT TERM GOAL #1   Title  independent with initial HEP    Time  2    Period  Weeks    Status  Achieved        PT Long Term Goals - 12/22/17 0941      PT LONG TERM GOAL #1   Title  decrease pain 25%    Baseline  patient reports 50% improvement in pain    Time  8    Period  Weeks     Status  Achieved      PT LONG TERM GOAL #2   Title  increase left shoulder flexion to 100 degrees    Baseline  active 80 deg in sitting    Time  8    Period  Weeks    Status  On-going            Plan - 12/25/17 1004    Clinical Impression Statement  Pt reports improvement overall. She did need tactile cues to keep elbows to her side with external rotation. Some shoulder elevation noted with flexion and abduction.     Rehab Potential  Fair    PT Frequency  2x / week    PT Duration  8 weeks    PT Treatment/Interventions  ADLs/Self Care Home Management;Cryotherapy;Electrical Stimulation;Iontophoresis 4mg /ml Dexamethasone;Moist Heat;Ultrasound;Therapeutic exercise;Therapeutic activities;Patient/family education;Manual techniques;Dry needling;Gait training;Balance training;Functional mobility training;Stair training    PT Next Visit Plan  add LE strength and gait and balance treatment to the current plan       Patient will benefit from skilled therapeutic intervention in order to improve the following deficits and impairments:  Abnormal gait, Decreased coordination, Decreased range of motion, Difficulty walking, Impaired UE functional use, Increased muscle spasms, Pain, Improper body mechanics, Decreased mobility, Decreased strength, Postural dysfunction  Visit Diagnosis: Stiffness of left shoulder, not elsewhere classified  Acute pain of left shoulder     Problem List Patient Active Problem List   Diagnosis Date Noted  . Muscle spasm 10/28/2016  . DDD (degenerative disc disease), lumbar 10/07/2016  . Primary osteoarthritis of both hands 10/07/2016  . Primary osteoarthritis of both feet 10/07/2016  . Tension-type headache, not intractable 09/16/2016  . GERD (gastroesophageal reflux disease) 09/11/2016  . ANA positive 09/02/2016  . Myalgia 06/26/2016  . Shortness of breath 03/22/2015  . History of total knee replacement, bilateral 03/20/2015  . Hypothyroidism 09/21/2014  .  Cephalalgia 09/15/2014  . Gout 06/20/2014  . Lower leg edema 06/13/2014  . Pain in both feet 06/13/2014  . Chest wall pain 04/29/2014  . Meningioma ([place]) 10/07/2013  .  Chronic tension-type headache, intractable 10/07/2013  . Pain of right lower leg 08/11/2013  . Neck pain 07/28/2013  . Swelling of limb 07/07/2013  . Headache(784.0) 07/07/2013  . Depression 06/23/2013  . Insomnia 05/26/2013  . Routine general medical examination at a health care facility 10/17/2011  . HTN (hypertension) 07/03/2011  . Hyperlipidemia 07/03/2011  . Venous insufficiency, peripheral 07/03/2011    Scot Jun, PTA 12/25/2017, 10:09 AM  [place], [place], [address] Phone: [phone]   Fax:  [phone]  Name: Kristen Manning MRN: [id number] Date of Birth: May 29, 1932

## 2017-12-29 ENCOUNTER — Ambulatory Visit: Payer: Medicare Other | Admitting: Family Medicine

## 2017-12-29 ENCOUNTER — Ambulatory Visit: Payer: Medicare Other | Admitting: Physical Therapy

## 2017-12-30 ENCOUNTER — Ambulatory Visit: Payer: Medicare Other | Admitting: Physical Therapy

## 2017-12-30 DIAGNOSIS — M25512 Pain in left shoulder: Secondary | ICD-10-CM | POA: Diagnosis not present

## 2017-12-30 DIAGNOSIS — R262 Difficulty in walking, not elsewhere classified: Secondary | ICD-10-CM | POA: Diagnosis not present

## 2017-12-30 DIAGNOSIS — G8929 Other chronic pain: Secondary | ICD-10-CM

## 2017-12-30 DIAGNOSIS — R2681 Unsteadiness on feet: Secondary | ICD-10-CM | POA: Diagnosis not present

## 2017-12-30 DIAGNOSIS — M25562 Pain in left knee: Secondary | ICD-10-CM | POA: Diagnosis not present

## 2017-12-30 DIAGNOSIS — M25561 Pain in right knee: Secondary | ICD-10-CM

## 2017-12-30 DIAGNOSIS — M25612 Stiffness of left shoulder, not elsewhere classified: Secondary | ICD-10-CM

## 2017-12-30 NOTE — Therapy (Signed)
[place] [address] [place], [place], [address] Phone: [phone]   Fax:  [phone] Progress Note Reporting Period 11/30/17 to 12/30/17 for the first 10 visits  See note below for Objective Data and Assessment of Progress/Goals.      Physical Therapy Treatment  Patient Details  Name: Kristen Manning MRN: [id number] Date of Birth: Mar 25, 1932 Referring Provider (PT): Pamella Pert   Encounter Date: 12/30/2017  PT End of Session - 12/30/17 0920    Visit Number  10    Date for PT Re-Evaluation  01/18/18    PT Start Time  0845    PT Stop Time  0945    PT Time Calculation (min)  60 min       Past Medical History:  Diagnosis Date  . Arthritis   . Bacterial infection    in lungs in Jan 2015  . Bilateral lower extremity edema   . Cancer ([place])    skin cancer, back, legs melonama, sees Dr Lisabeth Pick, derm  . Chronic back pain   . GERD (gastroesophageal reflux disease)    takes Omeprazole daily  . Headache(784.0)   . History of migraine    last one about 15 yrs ago  . Hyperlipidemia    takes Zetia daily  . Hypertension    takes HCTZ daily  . Joint pain   . Joint swelling   . Myocardial infarction [facility])    pt states EKG always shows infarct but no change from previous ekg;never knew anything about it  . Nocturia   . Osteoarthritis   . Pneumonia    hx of-many yrs ago  . Pneumonia   . PONV (postoperative nausea and vomiting)   . Shortness of breath    with exertion  . Urinary frequency   . Urinary incontinence   . Urinary urgency     Past Surgical History:  Procedure Laterality Date  . bladder tack  1998  . BLEPHAROPLASTY Bilateral   . JOINT REPLACEMENT Left 10/02/2009  . JOINT REPLACEMENT Right 02-22-13   Knee  . KNEE SURGERY Bilateral 2009 and 2011  . OOPHORECTOMY  1998   only one  . TONSILLECTOMY  as a child ? date  . TOTAL KNEE ARTHROPLASTY Right 02/22/2013   DR Ronnie Derby  . TOTAL KNEE  ARTHROPLASTY Right 02/22/2013   Procedure: TOTAL KNEE ARTHROPLASTY;  Surgeon: Vickey Huger, MD;  Location: [facility];  Service: Orthopedics;  Laterality: Right;    There were no vitals filed for this visit.  Subjective Assessment - 12/30/17 0849    Subjective  sore throat, shld is stronger - I can move it more and do more with it. doing heat at home on knees and it helps.    Currently in Pain?  Yes    Pain Score  2     Pain Location  Shoulder    Pain Orientation  Left         [facility] PT Assessment - 12/30/17 0001      PROM   PROM Assessment Site  Shoulder    Right/Left Shoulder  Left    Left Shoulder Flexion  115 Degrees    Left Shoulder ABduction  95 Degrees      Strength   Overall Strength Comments  knee 4-/5                   [facility] Adult PT Treatment/Exercise - 12/30/17 0001      Exercises  Exercises  Shoulder;Knee/Hip      Knee/Hip Exercises: Aerobic   Nustep  level 3 x 5 minute      Knee/Hip Exercises: Machines for Strengthening   Cybex Knee Extension  5# 2 sets 10    Cybex Knee Flexion  15# 2 sets 10      Shoulder Exercises: Standing   Other Standing Exercises  4# cane ex for strength and ROM 12 reps each way    Other Standing Exercises  STS with wt ball 2 sets 5      Shoulder Exercises: ROM/Strengthening   UBE (Upper Arm Bike)  L 3 3 fwd/3 back    Lat Pull  20 reps   15#   Cybex Row  20 reps   15#     Moist Heat Therapy   Number Minutes Moist Heat  15 Minutes    Moist Heat Location  Shoulder      Electrical Stimulation   Electrical Stimulation Location  L shoulder    Electrical Stimulation Action  IFC    Electrical Stimulation Parameters  sitting    Electrical Stimulation Goals  Pain               PT Short Term Goals - 12/12/17 0933      PT SHORT TERM GOAL #1   Title  independent with initial HEP    Time  2    Period  Weeks    Status  Achieved        PT Long Term Goals - 12/30/17 0920      PT LONG TERM GOAL #1   Title   decrease pain 25%      PT LONG TERM GOAL #2   Title  increase left shoulder flexion to 100 degrees    Status  Partially Met      PT LONG TERM GOAL #3   Title  repoort that she can vacuum without difficulty    Status  Partially Met      PT LONG TERM GOAL #4   Title  report able to dress without difficulty    Status  Partially Met      PT LONG TERM GOAL #5   Title  report able to do hair without difficulty    Status  Partially Met            Plan - 12/30/17 0921    Clinical Impression Statement  improved shld ROM and knee strength, pt reports overall increased func use. added new ex today and pt tolertaed well. progressing with goals    PT Treatment/Interventions  ADLs/Self Care Home Management;Cryotherapy;Electrical Stimulation;Iontophoresis 75m/ml Dexamethasone;Moist Heat;Ultrasound;Therapeutic exercise;Therapeutic activities;Patient/family education;Manual techniques;Dry needling;Gait training;Balance training;Functional mobility training;Stair training    PT Next Visit Plan  progress strength and func       Patient will benefit from skilled therapeutic intervention in order to improve the following deficits and impairments:  Abnormal gait, Decreased coordination, Decreased range of motion, Difficulty walking, Impaired UE functional use, Increased muscle spasms, Pain, Improper body mechanics, Decreased mobility, Decreased strength, Postural dysfunction  Visit Diagnosis: Stiffness of left shoulder, not elsewhere classified  Acute pain of left shoulder  Chronic pain of left knee  Chronic pain of right knee  Difficulty in walking, not elsewhere classified     Problem List Patient Active Problem List   Diagnosis Date Noted  . Muscle spasm 10/28/2016  . DDD (degenerative disc disease), lumbar 10/07/2016  . Primary osteoarthritis of both hands 10/07/2016  . Primary osteoarthritis  of both feet 10/07/2016  . Tension-type headache, not intractable 09/16/2016  . GERD  (gastroesophageal reflux disease) 09/11/2016  . ANA positive 09/02/2016  . Myalgia 06/26/2016  . Shortness of breath 03/22/2015  . History of total knee replacement, bilateral 03/20/2015  . Hypothyroidism 09/21/2014  . Cephalalgia 09/15/2014  . Gout 06/20/2014  . Lower leg edema 06/13/2014  . Pain in both feet 06/13/2014  . Chest wall pain 04/29/2014  . Meningioma ([place]) 10/07/2013  . Chronic tension-type headache, intractable 10/07/2013  . Pain of right lower leg 08/11/2013  . Neck pain 07/28/2013  . Swelling of limb 07/07/2013  . Headache(784.0) 07/07/2013  . Depression 06/23/2013  . Insomnia 05/26/2013  . Routine general medical examination at a health care facility 10/17/2011  . HTN (hypertension) 07/03/2011  . Hyperlipidemia 07/03/2011  . Venous insufficiency, peripheral 07/03/2011    Nadiya Pieratt,ANGIE PTA 12/30/2017, 9:23 AM  [place], [place], [address] Phone: [phone]   Fax:  [phone]  Name: DEVYN GRIFFING MRN: [id number] Date of Birth: [birth year]

## 2018-01-02 DIAGNOSIS — I739 Peripheral vascular disease, unspecified: Secondary | ICD-10-CM | POA: Diagnosis not present

## 2018-01-02 DIAGNOSIS — L603 Nail dystrophy: Secondary | ICD-10-CM | POA: Diagnosis not present

## 2018-01-06 ENCOUNTER — Ambulatory Visit: Payer: Medicare Other | Admitting: Physical Therapy

## 2018-01-06 DIAGNOSIS — M25612 Stiffness of left shoulder, not elsewhere classified: Secondary | ICD-10-CM | POA: Diagnosis not present

## 2018-01-06 DIAGNOSIS — M25561 Pain in right knee: Secondary | ICD-10-CM | POA: Diagnosis not present

## 2018-01-06 DIAGNOSIS — G8929 Other chronic pain: Secondary | ICD-10-CM

## 2018-01-06 DIAGNOSIS — M25562 Pain in left knee: Secondary | ICD-10-CM | POA: Diagnosis not present

## 2018-01-06 DIAGNOSIS — R2681 Unsteadiness on feet: Secondary | ICD-10-CM | POA: Diagnosis not present

## 2018-01-06 DIAGNOSIS — M25512 Pain in left shoulder: Secondary | ICD-10-CM | POA: Diagnosis not present

## 2018-01-06 DIAGNOSIS — R262 Difficulty in walking, not elsewhere classified: Secondary | ICD-10-CM | POA: Diagnosis not present

## 2018-01-06 NOTE — Therapy (Signed)
[place], [place], [address] Phone: [phone]   Fax:  [phone]  Physical Therapy Treatment  Patient Details  Name: Kristen Manning MRN: [id number] Date of Birth: April 11, 1932 Referring Provider (PT): Pamella Pert   Encounter Date: 01/06/2018  PT End of Session - 01/06/18 0924    Visit Number  11    Date for PT Re-Evaluation  01/18/18    PT Start Time  0850    PT Stop Time  0945    PT Time Calculation (min)  55 min       Past Medical History:  Diagnosis Date  . Arthritis   . Bacterial infection    in lungs in Jan 2015  . Bilateral lower extremity edema   . Cancer ([place])    skin cancer, back, legs melonama, sees Dr Lisabeth Pick, derm  . Chronic back pain   . GERD (gastroesophageal reflux disease)    takes Omeprazole daily  . Headache(784.0)   . History of migraine    last one about 15 yrs ago  . Hyperlipidemia    takes Zetia daily  . Hypertension    takes HCTZ daily  . Joint pain   . Joint swelling   . Myocardial infarction [facility])    pt states EKG always shows infarct but no change from previous ekg;never knew anything about it  . Nocturia   . Osteoarthritis   . Pneumonia    hx of-many yrs ago  . Pneumonia   . PONV (postoperative nausea and vomiting)   . Shortness of breath    with exertion  . Urinary frequency   . Urinary incontinence   . Urinary urgency     Past Surgical History:  Procedure Laterality Date  . bladder tack  1998  . BLEPHAROPLASTY Bilateral   . JOINT REPLACEMENT Left 10/02/2009  . JOINT REPLACEMENT Right 02-22-13   Knee  . KNEE SURGERY Bilateral 2009 and 2011  . OOPHORECTOMY  1998   only one  . TONSILLECTOMY  as a child ? date  . TOTAL KNEE ARTHROPLASTY Right 02/22/2013   DR Ronnie Derby  . TOTAL KNEE ARTHROPLASTY Right 02/22/2013   Procedure: TOTAL KNEE ARTHROPLASTY;  Surgeon: Vickey Huger, MD;  Location: [place];  Service: Orthopedics;  Laterality: Right;     There were no vitals filed for this visit.  Subjective Assessment - 01/06/18 0849    Subjective  doing the same. leaving Monday for 2 weeks to help son with surgery    Currently in Pain?  Yes    Pain Score  2     Pain Location  Shoulder    Pain Orientation  Left                       [facility] Adult PT Treatment/Exercise - 01/06/18 0001      Knee/Hip Exercises: Aerobic   Nustep  level 3 x 5 minute      Knee/Hip Exercises: Machines for Strengthening   Cybex Knee Extension  5# 2 sets 10    Cybex Knee Flexion  15# 2 sets 10      Knee/Hip Exercises: Seated   Sit to Sand  10 reps;2 sets;without UE support   wt ball 1 set chest press, 2nd set OH press     Shoulder Exercises: Standing   Other Standing Exercises  2# cabinet reaching 2 levels 10 times flex and abd      Shoulder Exercises:  ROM/Strengthening   UBE (Upper Arm Bike)  L 3 3 fwd/3 back      Moist Heat Therapy   Number Minutes Moist Heat  15 Minutes    Moist Heat Location  Shoulder      Electrical Stimulation   Electrical Stimulation Location  L shoulder    Electrical Stimulation Action  IFC    Electrical Stimulation Parameters  sitting    Electrical Stimulation Goals  Pain               PT Short Term Goals - 12/12/17 0933      PT SHORT TERM GOAL #1   Title  independent with initial HEP    Time  2    Period  Weeks    Status  Achieved        PT Long Term Goals - 01/06/18 0354      PT LONG TERM GOAL #1   Title  decrease pain 25%    Status  Achieved      PT LONG TERM GOAL #2   Title  increase left shoulder flexion to 100 degrees    Baseline  active 82 deg in sitting    Status  Partially Met      PT LONG TERM GOAL #3   Title  repoort that she can vacuum without difficulty    Status  Partially Met      PT LONG TERM GOAL #4   Title  report able to dress without difficulty    Status  Partially Met      PT LONG TERM GOAL #5   Title  report able to do hair without difficulty     Status  Partially Met            Plan - 01/06/18 0925    Clinical Impression Statement  progressing with STGs. strength and func increasing, pt amb slowly with short shuffled gait. cuing for control of mvmt with exercise    PT Treatment/Interventions  ADLs/Self Care Home Management;Cryotherapy;Electrical Stimulation;Iontophoresis 27m/ml Dexamethasone;Moist Heat;Ultrasound;Therapeutic exercise;Therapeutic activities;Patient/family education;Manual techniques;Dry needling;Gait training;Balance training;Functional mobility training;Stair training    PT Next Visit Plan  pt will be here Monday  then OOT 203 weeks to care for son who is having surgery, assure HEP while OOT       Patient will benefit from skilled therapeutic intervention in order to improve the following deficits and impairments:  Abnormal gait, Decreased coordination, Decreased range of motion, Difficulty walking, Impaired UE functional use, Increased muscle spasms, Pain, Improper body mechanics, Decreased mobility, Decreased strength, Postural dysfunction  Visit Diagnosis: Stiffness of left shoulder, not elsewhere classified  Acute pain of left shoulder  Chronic pain of left knee  Chronic pain of right knee     Problem List Patient Active Problem List   Diagnosis Date Noted  . Muscle spasm 10/28/2016  . DDD (degenerative disc disease), lumbar 10/07/2016  . Primary osteoarthritis of both hands 10/07/2016  . Primary osteoarthritis of both feet 10/07/2016  . Tension-type headache, not intractable 09/16/2016  . GERD (gastroesophageal reflux disease) 09/11/2016  . ANA positive 09/02/2016  . Myalgia 06/26/2016  . Shortness of breath 03/22/2015  . History of total knee replacement, bilateral 03/20/2015  . Hypothyroidism 09/21/2014  . Cephalalgia 09/15/2014  . Gout 06/20/2014  . Lower leg edema 06/13/2014  . Pain in both feet 06/13/2014  . Chest wall pain 04/29/2014  . [place] 10/07/2013  . Chronic  tension-type headache, intractable 10/07/2013  . Pain of right lower  leg 08/11/2013  . Neck pain 07/28/2013  . Swelling of limb 07/07/2013  . Headache(784.0) 07/07/2013  . Depression 06/23/2013  . Insomnia 05/26/2013  . Routine general medical examination at a health care facility 10/17/2011  . HTN (hypertension) 07/03/2011  . Hyperlipidemia 07/03/2011  . Venous insufficiency, peripheral 07/03/2011    Whittany Parish,ANGIE PTA 01/06/2018, 9:27 AM  [address] [place], [place], [address] Phone: [phone]   Fax:  [phone]  Name: Kristen Manning MRN: [id number] Date of Birth: August 30, 1932

## 2018-01-08 ENCOUNTER — Ambulatory Visit (INDEPENDENT_AMBULATORY_CARE_PROVIDER_SITE_OTHER): Payer: Medicare Other | Admitting: Family Medicine

## 2018-01-08 ENCOUNTER — Encounter: Payer: Self-pay | Admitting: Family Medicine

## 2018-01-08 ENCOUNTER — Other Ambulatory Visit: Payer: Self-pay

## 2018-01-08 VITALS — BP 144/82 | HR 96 | Temp 98.2°F | Resp 18 | Ht 63.0 in | Wt 216.8 lb

## 2018-01-08 DIAGNOSIS — J320 Chronic maxillary sinusitis: Secondary | ICD-10-CM

## 2018-01-08 MED ORDER — AMOXICILLIN-POT CLAVULANATE 875-125 MG PO TABS: 1.0000 | ORAL_TABLET | Freq: Two times a day (BID) | ORAL | 0 refills | Status: DC

## 2018-01-08 NOTE — Progress Notes (Signed)
Subjective   CC:  Chief Complaint  Patient presents with  . Sinusitis    Symptoms started about 2 days ago. She has took Tylenol   Same day acute visit; PCP not available. New pt to me. Chart reviewed.   HPI: Kristen Manning is a 85 y.o. female who presents to the office today to address the problems listed above in the chief complaint.  Patient reports sinus congestion and pressure with thick drainage, mild nonproductive cough, ear pressure without pain, and mild malaise.  Symptoms have been present for several days but sinus pain has been present for weeks. [name] high fevers, GI symptoms, shortness of breath. Shehas had sinus infections in the past and this feels similar.  Patient is a non-smoker.  No history of asthma or COPD.  Has been struggling with tx for a frozen shoulder.   Has f/u scheduled with pcp (Dr. Birdie Riddle) later this month.   I reviewed the patients updated PMH, FH, and SocHx.    Patient Active Problem List   Diagnosis Date Noted  . Muscle spasm 10/28/2016  . DDD (degenerative disc disease), lumbar 10/07/2016  . Primary osteoarthritis of both hands 10/07/2016  . Primary osteoarthritis of both feet 10/07/2016  . Tension-type headache, not intractable 09/16/2016  . GERD (gastroesophageal reflux disease) 09/11/2016  . ANA positive 09/02/2016  . Myalgia 06/26/2016  . Shortness of breath 03/22/2015  . History of total knee replacement, bilateral 03/20/2015  . Hypothyroidism 09/21/2014  . Cephalalgia 09/15/2014  . Gout 06/20/2014  . Lower leg edema 06/13/2014  . Pain in both feet 06/13/2014  . Chest wall pain 04/29/2014  . Meningioma ([place]) 10/07/2013  . Chronic tension-type headache, intractable 10/07/2013  . Pain of right lower leg 08/11/2013  . Neck pain 07/28/2013  . Swelling of limb 07/07/2013  . Headache(784.0) 07/07/2013  . Depression 06/23/2013  . Insomnia 05/26/2013  . Routine general medical examination at a health care facility 10/17/2011    . HTN (hypertension) 07/03/2011  . Hyperlipidemia 07/03/2011  . Venous insufficiency, peripheral 07/03/2011   Current Meds  Medication Sig  . acetaminophen (TYLENOL) 500 MG tablet Take 1 tablet (500 mg total) by mouth every 8 (eight) hours as needed for moderate pain.  [name] allopurinol (ZYLOPRIM) 300 MG tablet Take 300 mg by mouth daily.  [name] azelastine (ASTELIN) 0.1 % nasal spray Place 2 sprays into both nostrils 2 (two) times daily. Use in each nostril as directed  . Calcium 600-200 MG-UNIT tablet Take 1 tablet by mouth daily.  [name] co-enzyme Q-10 30 MG capsule Take 30 mg by mouth daily.  . diclofenac sodium (VOLTAREN) 1 % GEL Apply 2 g topically 4 (four) times daily.  [name] ezetimibe (ZETIA) 10 MG tablet TAKE 1 TABLET BY MOUTH ONCE DAILY  . fenofibrate 160 MG tablet TAKE 1 TABLET BY MOUTH ONCE DAILY  . furosemide (LASIX) 40 MG tablet Take 1 tablet (40 mg total) by mouth daily.  [name] levothyroxine (SYNTHROID, LEVOTHROID) 50 MCG tablet TAKE 1 TABLET BY MOUTH ONCE DAILY  . loratadine (CLARITIN) 10 MG tablet Take 1 tablet (10 mg total) by mouth daily.  . NON FORMULARY Take 1 tablet by mouth daily.  [name] omeprazole (PRILOSEC) 20 MG capsule Take 20 mg by mouth daily.  . pregabalin (LYRICA) 50 MG capsule Take 1 capsule (50 mg total) by mouth 3 (three) times daily.  [name] triamcinolone (NASACORT) 55 MCG/ACT AERO nasal inhaler Place 2 sprays into the nose daily.  [name] triamcinolone cream (KENALOG) 0.1 %  Apply 1 application topically 2 (two) times daily. For two weeks as needed    Review of Systems: Cardiovascular: negative for chest pain Respiratory: negative for SOB or persistent cough Gastrointestinal: negative for abdominal pain Genitourinary: negative for dysuria or gross hematuria  Objective  Vitals: BP (!) 144/82   Pulse 96   Temp 98.2 F (36.8 C) (Oral)   Resp 18   Ht 5\' 3"  (1.6 m)   Wt 216 lb 12.8 oz (98.3 kg)   SpO2 95%   BMI 38.40 kg/m  General: no acute distress , no respiratory  distress Psych:  Alert and oriented, normal mood and affect HEENT:  Normocephalic, atraumatic, TMs with serous effusions or retraction w/o erythema, nasal mucosa is red with purulent drainage, tender maxillary sinus present with facial swelling present, OP mild erythematous w/o eudate, supple neck with LAD Cardiovascular:  RRR without murmur or gallop. no peripheral edema Respiratory:  Good breath sounds bilaterally, CTAB with normal respiratory effort Skin:  Warm, no rashes Neurologic:   Mental status is normal. normal gait  Assessment  1. Left maxillary sinusitis      Plan    Sinusitis: History and exam is most consistent with bacterial sinus infection.  Etiology and prognosis discussed with patient.  Recommend antibiotics as ordered below.  Patient to complete course of antibiotics, use supportive medications like mucolytics and decongestants as needed.  May use Tylenol or Advil if needed.  Symptoms should improve over the next 2 weeks.  Patient will return or call if symptoms persist or worsen.  Follow up: as scheduled.    Commons side effects, risks, benefits, and alternatives for medications and treatment plan prescribed today were discussed, and the patient expressed understanding of the given instructions. Patient is instructed to call or message via MyChart if he/she has any questions or concerns regarding our treatment plan. No barriers to understanding were identified. We discussed Red Flag symptoms and signs in detail. Patient expressed understanding regarding what to do in case of urgent or emergency type symptoms.   Medication list was reconciled, printed and provided to the patient in AVS. Patient instructions and summary information was reviewed with the patient as documented in the AVS. This note was prepared with assistance of Dragon voice recognition software. Occasional wrong-word or sound-a-like substitutions may have occurred due to the inherent limitations of voice  recognition software  No orders of the defined types were placed in this encounter.  Meds ordered this encounter  Medications  . amoxicillin-clavulanate (AUGMENTIN) 875-125 MG tablet    Sig: Take 1 tablet by mouth 2 (two) times daily.    Dispense:  14 tablet    Refill:  0

## 2018-01-08 NOTE — Patient Instructions (Signed)
Please return with Dr. Birdie Riddle as scheduled.   If you have any questions or concerns, please don't hesitate to send me a message via MyChart or call the office at [phone]. Thank you for visiting with [place] today! It's our pleasure caring for you.  I've ordered antibiotics for you to take as directed/

## 2018-01-09 ENCOUNTER — Encounter: Payer: Self-pay | Admitting: Physical Therapy

## 2018-01-09 ENCOUNTER — Ambulatory Visit: Payer: Medicare Other | Attending: Family Medicine | Admitting: Physical Therapy

## 2018-01-09 DIAGNOSIS — R262 Difficulty in walking, not elsewhere classified: Secondary | ICD-10-CM | POA: Diagnosis not present

## 2018-01-09 DIAGNOSIS — M25612 Stiffness of left shoulder, not elsewhere classified: Secondary | ICD-10-CM | POA: Diagnosis not present

## 2018-01-09 DIAGNOSIS — G8929 Other chronic pain: Secondary | ICD-10-CM | POA: Insufficient documentation

## 2018-01-09 DIAGNOSIS — M25562 Pain in left knee: Secondary | ICD-10-CM | POA: Insufficient documentation

## 2018-01-09 DIAGNOSIS — M25512 Pain in left shoulder: Secondary | ICD-10-CM | POA: Insufficient documentation

## 2018-01-09 DIAGNOSIS — M25561 Pain in right knee: Secondary | ICD-10-CM | POA: Diagnosis not present

## 2018-01-09 DIAGNOSIS — R2681 Unsteadiness on feet: Secondary | ICD-10-CM | POA: Insufficient documentation

## 2018-01-09 NOTE — Therapy (Signed)
[place], [place], [address] Phone: [phone]   Fax:  [phone]  Physical Therapy Treatment  Patient Details  Name: Kristen Manning MRN: [id number] Date of Birth: 05/24/1932 Referring Provider (PT): Pamella Pert   Encounter Date: 01/09/2018  PT End of Session - 01/09/18 0915    Visit Number  12    Date for PT Re-Evaluation  01/18/18    PT Start Time  0848    PT Stop Time  0935    PT Time Calculation (min)  47 min       Past Medical History:  Diagnosis Date  . Arthritis   . Bacterial infection    in lungs in Jan 2015  . Bilateral lower extremity edema   . Cancer ([place])    skin cancer, back, legs melonama, sees Dr Lisabeth Pick, derm  . Chronic back pain   . GERD (gastroesophageal reflux disease)    takes Omeprazole daily  . Headache(784.0)   . History of migraine    last one about 15 yrs ago  . Hyperlipidemia    takes Zetia daily  . Hypertension    takes HCTZ daily  . Joint pain   . Joint swelling   . Myocardial infarction [facility])    pt states EKG always shows infarct but no change from previous ekg;never knew anything about it  . Nocturia   . Osteoarthritis   . Pneumonia    hx of-many yrs ago  . Pneumonia   . PONV (postoperative nausea and vomiting)   . Shortness of breath    with exertion  . Urinary frequency   . Urinary incontinence   . Urinary urgency     Past Surgical History:  Procedure Laterality Date  . bladder tack  1998  . BLEPHAROPLASTY Bilateral   . JOINT REPLACEMENT Left 10/02/2009  . JOINT REPLACEMENT Right 02-22-13   Knee  . KNEE SURGERY Bilateral 2009 and 2011  . OOPHORECTOMY  1998   only one  . TONSILLECTOMY  as a child ? date  . TOTAL KNEE ARTHROPLASTY Right 02/22/2013   DR Ronnie Derby  . TOTAL KNEE ARTHROPLASTY Right 02/22/2013   Procedure: TOTAL KNEE ARTHROPLASTY;  Surgeon: Vickey Huger, MD;  Location: [place];  Service: Orthopedics;  Laterality: Right;     There were no vitals filed for this visit.  Subjective Assessment - 01/09/18 0904    Subjective  I will be here Monday then OOT 2-3 weeks    Currently in Pain?  Yes    Pain Score  2     Pain Location  Shoulder                       [facility] Adult PT Treatment/Exercise - 01/09/18 0001      Knee/Hip Exercises: Machines for Strengthening   Cybex Knee Extension  5# 2 sets 10    Cybex Knee Flexion  15# 2 sets 10      Knee/Hip Exercises: Standing   Heel Raises  15 reps    Functional Squat  10 reps      Shoulder Exercises: ROM/Strengthening   Lat Pull  20 reps   15#   Cybex Row  20 reps   20#            PT Education - 01/09/18 0906    Education Details  scap stab HEP yellow tband,hip 4 way red tband, seated ankle pumps,LAQ,hip abd and marching  Person(s) Educated  Patient    Methods  Explanation;Demonstration;Handout    Comprehension  Verbalized understanding;Returned demonstration       PT Short Term Goals - 12/12/17 0933      PT SHORT TERM GOAL #1   Title  independent with initial HEP    Time  2    Period  Weeks    Status  Achieved        PT Long Term Goals - 01/09/18 5573      PT LONG TERM GOAL #2   Title  increase left shoulder flexion to 100 degrees    Baseline  AROM standing 130    Status  Achieved      PT LONG TERM GOAL #3   Title  repoort that she can vacuum without difficulty    Status  Partially Met      PT LONG TERM GOAL #4   Title  report able to dress without difficulty    Baseline  hard to get L arm in esp with coats    Status  Partially Met      PT LONG TERM GOAL #5   Title  report able to do hair without difficulty    Baseline  difficult to get up to hair and hold    Status  Partially Met            Plan - 01/09/18 0916    Clinical Impression Statement  issued HEP for UE and LE while OOT ( 1-3 weeks). increased left shld flexion but weakness is still limiting ADLs. progressing with LTGs    PT  Treatment/Interventions  ADLs/Self Care Home Management;Cryotherapy;Electrical Stimulation;Iontophoresis 98m/ml Dexamethasone;Moist Heat;Ultrasound;Therapeutic exercise;Therapeutic activities;Patient/family education;Manual techniques;Dry needling;Gait training;Balance training;Functional mobility training;Stair training    PT Next Visit Plan  assure independance with HEP as pt leaving after Monday visit for 1-3 weeks to help son who is having surgery. func strengthening       Patient will benefit from skilled therapeutic intervention in order to improve the following deficits and impairments:  Abnormal gait, Decreased coordination, Decreased range of motion, Difficulty walking, Impaired UE functional use, Increased muscle spasms, Pain, Improper body mechanics, Decreased mobility, Decreased strength, Postural dysfunction  Visit Diagnosis: Stiffness of left shoulder, not elsewhere classified  Acute pain of left shoulder  Chronic pain of left knee  Chronic pain of right knee  Difficulty in walking, not elsewhere classified  Unsteady gait     Problem List Patient Active Problem List   Diagnosis Date Noted  . Muscle spasm 10/28/2016  . DDD (degenerative disc disease), lumbar 10/07/2016  . Primary osteoarthritis of both hands 10/07/2016  . Primary osteoarthritis of both feet 10/07/2016  . Tension-type headache, not intractable 09/16/2016  . GERD (gastroesophageal reflux disease) 09/11/2016  . ANA positive 09/02/2016  . Myalgia 06/26/2016  . Shortness of breath 03/22/2015  . History of total knee replacement, bilateral 03/20/2015  . Hypothyroidism 09/21/2014  . Cephalalgia 09/15/2014  . Gout 06/20/2014  . Lower leg edema 06/13/2014  . Pain in both feet 06/13/2014  . Chest wall pain 04/29/2014  . [place] 10/07/2013  . Chronic tension-type headache, intractable 10/07/2013  . Pain of right lower leg 08/11/2013  . Neck pain 07/28/2013  . Swelling of limb 07/07/2013  .  Headache(784.0) 07/07/2013  . Depression 06/23/2013  . Insomnia 05/26/2013  . Routine general medical examination at a health care facility 10/17/2011  . HTN (hypertension) 07/03/2011  . Hyperlipidemia 07/03/2011  . Venous insufficiency, peripheral 07/03/2011  Brittin Belnap,ANGIE PTA 01/09/2018, 9:22 AM  [place] [place], [place], [address] Phone: [phone]   Fax:  [phone]  Name: LAYLYNN CAMPANELLA MRN: [id number] Date of Birth: 1932-10-14

## 2018-01-12 ENCOUNTER — Ambulatory Visit: Payer: Medicare Other | Admitting: Physical Therapy

## 2018-01-12 DIAGNOSIS — M25512 Pain in left shoulder: Secondary | ICD-10-CM | POA: Diagnosis not present

## 2018-01-12 DIAGNOSIS — R262 Difficulty in walking, not elsewhere classified: Secondary | ICD-10-CM | POA: Diagnosis not present

## 2018-01-12 DIAGNOSIS — M25612 Stiffness of left shoulder, not elsewhere classified: Secondary | ICD-10-CM

## 2018-01-12 DIAGNOSIS — M25561 Pain in right knee: Secondary | ICD-10-CM | POA: Diagnosis not present

## 2018-01-12 DIAGNOSIS — R2681 Unsteadiness on feet: Secondary | ICD-10-CM | POA: Diagnosis not present

## 2018-01-12 DIAGNOSIS — G8929 Other chronic pain: Secondary | ICD-10-CM | POA: Diagnosis not present

## 2018-01-12 DIAGNOSIS — M25562 Pain in left knee: Secondary | ICD-10-CM | POA: Diagnosis not present

## 2018-01-12 NOTE — Therapy (Signed)
[facility] [address] [place], [place], [address] Phone: [phone]   Fax:  [phone]  Physical Therapy Treatment  Patient Details  Name: Kristen Manning MRN: [id number] Date of Birth: 10/11/1932 Referring Provider (PT): Pamella Pert   Encounter Date: 01/12/2018  PT End of Session - 01/12/18 0850    Visit Number  13    Date for PT Re-Evaluation  01/18/18    PT Start Time  0850    PT Stop Time  0941    PT Time Calculation (min)  51 min    Activity Tolerance  Patient tolerated treatment well    Behavior During Therapy  [facility] for tasks assessed/performed       Past Medical History:  Diagnosis Date  . Arthritis   . Bacterial infection    in lungs in Jan 2015  . Bilateral lower extremity edema   . Cancer ([place])    skin cancer, back, legs melonama, sees Dr Lisabeth Pick, derm  . Chronic back pain   . GERD (gastroesophageal reflux disease)    takes Omeprazole daily  . Headache(784.0)   . History of migraine    last one about 15 yrs ago  . Hyperlipidemia    takes Zetia daily  . Hypertension    takes HCTZ daily  . Joint pain   . Joint swelling   . Myocardial infarction [facility])    pt states EKG always shows infarct but no change from previous ekg;never knew anything about it  . Nocturia   . Osteoarthritis   . Pneumonia    hx of-many yrs ago  . Pneumonia   . PONV (postoperative nausea and vomiting)   . Shortness of breath    with exertion  . Urinary frequency   . Urinary incontinence   . Urinary urgency     Past Surgical History:  Procedure Laterality Date  . bladder tack  1998  . BLEPHAROPLASTY Bilateral   . JOINT REPLACEMENT Left 10/02/2009  . JOINT REPLACEMENT Right 02-22-13   Knee  . KNEE SURGERY Bilateral 2009 and 2011  . OOPHORECTOMY  1998   only one  . TONSILLECTOMY  as a child ? date  . TOTAL KNEE ARTHROPLASTY Right 02/22/2013   DR Ronnie Derby  . TOTAL KNEE ARTHROPLASTY Right 02/22/2013   Procedure:  TOTAL KNEE ARTHROPLASTY;  Surgeon: Vickey Huger, MD;  Location: [facility];  Service: Orthopedics;  Laterality: Right;    There were no vitals filed for this visit.  Subjective Assessment - 01/12/18 0854    Subjective  Patient states she is doing well with her shoulder.    Pertinent History  left TKR 7 years ago, right TKR 4 years ago.  Lymphedema of the LE's    How long can you walk comfortably?  200-300 feet    Patient Stated Goals  more confident in walking, stronger    Currently in Pain?  Yes    Pain Score  2     Pain Location  Shoulder   nagging   Pain Orientation  Left    Pain Descriptors / Indicators  Nagging    Pain Type  Acute pain    Pain Radiating Towards  mostly into triceps; occassionally into hand    Pain Onset  More than a month ago    Pain Frequency  Constant                       [facility] Adult PT Treatment/Exercise -  01/12/18 0001      Knee/Hip Exercises: Aerobic   Nustep  Level 4 x 6 min      Knee/Hip Exercises: Machines for Strengthening   Cybex Knee Extension  5# 2 sets 10    Cybex Knee Flexion  15# 2 sets 10      Knee/Hip Exercises: Standing   Heel Raises  20 reps    Hip Abduction  Stengthening;Both;4 sets;5 reps    Functional Squat  20 reps      Shoulder Exercises: Standing   Extension  Strengthening;Both;20 reps;Theraband    Theraband Level (Shoulder Extension)  Level 1 (Yellow)    Extension Limitations  VC's for proper form    Row  Strengthening;Both;20 reps;Theraband    Theraband Level (Shoulder Row)  Level 1 (Yellow)    Row Limitations  VC's for proper form      Shoulder Exercises: ROM/Strengthening   Lat Pull  20 reps   15#   Cybex Row  20 reps   20#              PT Short Term Goals - 12/12/17 0933      PT SHORT TERM GOAL #1   Title  independent with initial HEP    Time  2    Period  Weeks    Status  Achieved        PT Long Term Goals - 01/09/18 2440      PT LONG TERM GOAL #2   Title  increase left shoulder  flexion to 100 degrees    Baseline  AROM standing 130    Status  Achieved      PT LONG TERM GOAL #3   Title  repoort that she can vacuum without difficulty    Status  Partially Met      PT LONG TERM GOAL #4   Title  report able to dress without difficulty    Baseline  hard to get L arm in esp with coats    Status  Partially Met      PT LONG TERM GOAL #5   Title  report able to do hair without difficulty    Baseline  difficult to get up to hair and hold    Status  Partially Met            Plan - 01/12/18 0930    Clinical Impression Statement  Patient did fairly well today with TE. We reviewed HEP and she had a lot of difficulty with hip ABD with resistance. We modified to no resistance and decreased reps to 5 per set to avoid compensation in the trunk. She also requires VCs to perform scapular stab exercises correctly. No change in goals from last visit. Patient to return to PT when she returns from out of town.    Rehab Potential  Fair    PT Frequency  2x / week    PT Duration  8 weeks    PT Treatment/Interventions  ADLs/Self Care Home Management;Cryotherapy;Electrical Stimulation;Iontophoresis 79m/ml Dexamethasone;Moist Heat;Ultrasound;Therapeutic exercise;Therapeutic activities;Patient/family education;Manual techniques;Dry needling;Gait training;Balance training;Functional mobility training;Stair training    PT Next Visit Plan  reassess goals  recert if indicated   Consulted and Agree with Plan of Care  Patient       Patient will benefit from skilled therapeutic intervention in order to improve the following deficits and impairments:  Abnormal gait, Decreased coordination, Decreased range of motion, Difficulty walking, Impaired UE functional use, Increased muscle spasms, Pain, Improper body mechanics, Decreased mobility, Decreased  strength, Postural dysfunction  Visit Diagnosis: Stiffness of left shoulder, not elsewhere classified  Acute pain of left  shoulder     Problem List Patient Active Problem List   Diagnosis Date Noted  . Muscle spasm 10/28/2016  . DDD (degenerative disc disease), lumbar 10/07/2016  . Primary osteoarthritis of both hands 10/07/2016  . Primary osteoarthritis of both feet 10/07/2016  . Tension-type headache, not intractable 09/16/2016  . GERD (gastroesophageal reflux disease) 09/11/2016  . ANA positive 09/02/2016  . Myalgia 06/26/2016  . Shortness of breath 03/22/2015  . History of total knee replacement, bilateral 03/20/2015  . Hypothyroidism 09/21/2014  . Cephalalgia 09/15/2014  . Gout 06/20/2014  . Lower leg edema 06/13/2014  . Pain in both feet 06/13/2014  . Chest wall pain 04/29/2014  . Meningioma ([place]) 10/07/2013  . Chronic tension-type headache, intractable 10/07/2013  . Pain of right lower leg 08/11/2013  . Neck pain 07/28/2013  . Swelling of limb 07/07/2013  . Headache(784.0) 07/07/2013  . Depression 06/23/2013  . Insomnia 05/26/2013  . Routine general medical examination at a health care facility 10/17/2011  . HTN (hypertension) 07/03/2011  . Hyperlipidemia 07/03/2011  . Venous insufficiency, peripheral 07/03/2011    Madelyn Flavors PT 01/12/2018, 1:29 PM  [facility] [address] [place], [place], [address] Phone: [phone]   Fax:  [phone]  Name: DEENA SHAUB MRN: [id number] Date of Birth: [birth year]

## 2018-01-26 ENCOUNTER — Ambulatory Visit: Payer: Medicare Other | Admitting: Family Medicine

## 2018-02-02 ENCOUNTER — Ambulatory Visit: Payer: Medicare Other | Admitting: Family Medicine

## 2018-02-11 ENCOUNTER — Ambulatory Visit (INDEPENDENT_AMBULATORY_CARE_PROVIDER_SITE_OTHER): Payer: Medicare Other | Admitting: Family Medicine

## 2018-02-11 ENCOUNTER — Other Ambulatory Visit: Payer: Self-pay

## 2018-02-11 ENCOUNTER — Encounter: Payer: Self-pay | Admitting: Family Medicine

## 2018-02-11 VITALS — BP 142/90 | HR 76 | Temp 98.1°F | Resp 18 | Ht 63.0 in | Wt 213.4 lb

## 2018-02-11 DIAGNOSIS — I10 Essential (primary) hypertension: Secondary | ICD-10-CM | POA: Diagnosis not present

## 2018-02-11 DIAGNOSIS — B9689 Other specified bacterial agents as the cause of diseases classified elsewhere: Secondary | ICD-10-CM

## 2018-02-11 DIAGNOSIS — J329 Chronic sinusitis, unspecified: Secondary | ICD-10-CM | POA: Diagnosis not present

## 2018-02-11 DIAGNOSIS — E7800 Pure hypercholesterolemia, unspecified: Secondary | ICD-10-CM

## 2018-02-11 DIAGNOSIS — E038 Other specified hypothyroidism: Secondary | ICD-10-CM

## 2018-02-11 LAB — HEPATIC FUNCTION PANEL
ALT: 11 U/L (ref 0–35)
AST: 16 U/L (ref 0–37)
Albumin: 3.7 g/dL (ref 3.5–5.2)
Alkaline Phosphatase: 95 U/L (ref 39–117)
Bilirubin, Direct: 0.1 mg/dL (ref 0.0–0.3)
Total Bilirubin: 0.5 mg/dL (ref 0.2–1.2)
Total Protein: 6.9 g/dL (ref 6.0–8.3)

## 2018-02-11 LAB — BASIC METABOLIC PANEL
BUN: 19 mg/dL (ref 6–23)
CO2: 25 mEq/L (ref 19–32)
Calcium: 9.3 mg/dL (ref 8.4–10.5)
Chloride: 105 mEq/L (ref 96–112)
Creatinine, Ser: 1.05 mg/dL (ref 0.40–1.20)
GFR: 50.01 mL/min — ABNORMAL LOW (ref >60.00)
Glucose, Bld: 80 mg/dL (ref 70–99)
Potassium: 4.9 mEq/L (ref 3.5–5.1)
Sodium: 141 mEq/L (ref 135–145)

## 2018-02-11 LAB — CBC WITH DIFFERENTIAL/PLATELET
Basophils Absolute: 0.1 10*3/uL (ref 0.0–0.1)
Basophils Relative: 0.9 % (ref 0.0–3.0)
Eosinophils Absolute: 0.3 10*3/uL (ref 0.0–0.7)
Eosinophils Relative: 3.9 % (ref 0.0–5.0)
HCT: 41 % (ref 36.0–46.0)
Hemoglobin: 13.7 g/dL (ref 12.0–15.0)
Lymphocytes Relative: 18 % (ref 12.0–46.0)
Lymphs Abs: 1.4 10*3/uL (ref 0.7–4.0)
MCHC: 33.4 g/dL (ref 30.0–36.0)
MCV: 89.8 fl (ref 78.0–100.0)
Monocytes Absolute: 0.5 10*3/uL (ref 0.1–1.0)
Monocytes Relative: 6.2 % (ref 3.0–12.0)
Neutro Abs: 5.7 10*3/uL (ref 1.4–7.7)
Neutrophils Relative %: 71 % (ref 43.0–77.0)
Platelets: 306 10*3/uL (ref 150.0–400.0)
RBC: 4.57 Mil/uL (ref 3.87–5.11)
RDW: 14 % (ref 11.5–15.5)
WBC: 8 10*3/uL (ref 4.0–10.5)

## 2018-02-11 LAB — LIPID PANEL
Cholesterol: 187 mg/dL (ref 0–200)
HDL: 45.8 mg/dL (ref >39.00)
LDL Cholesterol: 111 mg/dL — ABNORMAL HIGH (ref 0–99)
NonHDL: 141.16
Total CHOL/HDL Ratio: 4
Triglycerides: 151 mg/dL — ABNORMAL HIGH (ref 0.0–149.0)
VLDL: 30.2 mg/dL (ref 0.0–40.0)

## 2018-02-11 LAB — TSH: TSH: 4.44 u[IU]/mL (ref 0.35–4.50)

## 2018-02-11 MED ORDER — AMOXICILLIN 875 MG PO TABS: 875.0000 mg | ORAL_TABLET | Freq: Two times a day (BID) | ORAL | 0 refills | Status: DC

## 2018-02-11 NOTE — Assessment & Plan Note (Signed)
Given her BMI of 37.8 and her multiple medical comorbidities, she qualifies as morbidly obese.  Check labs to risk stratify.  Encouraged healthy diet and exercise as able.  Will follow.

## 2018-02-11 NOTE — Assessment & Plan Note (Signed)
Chronic problem.  Currently asymptomatic.  Check labs.  Adjust meds prn  

## 2018-02-11 NOTE — Assessment & Plan Note (Signed)
Chronic problem.  Intolerant to statins.  Applauded her efforts at healthy diet.  Will follow.

## 2018-02-11 NOTE — Patient Instructions (Addendum)
Schedule your complete physical in 6 months We'll notify you of your lab results and make any changes if needed Continue to work on healthy diet and exercise as you are able Start the Amoxicillin twice daily- take w/ food- for the sinus infection Continue your daily allergy medications Call with any questions or concerns Happy Valentine's Day!

## 2018-02-11 NOTE — Progress Notes (Signed)
   Subjective:    Patient ID: Kristen Manning, female    DOB: 06/03/1932, 85 y.o.   MRN: [id number]  HPI HTN- chronic problem, on Lasix 40mg .  No CP, SOB, visual changes, edema above baseline  Hyperlipidemia- chronic problem, intolerant to statins.  On Zetia and Fenofibrate daily.  + constipation.  Denies abd pain, N/V.  Hypothyroid- chronic problem, on Levothyroxine 79mcg.  Denies excessive fatigue, changes to skin/hair/nails.  Obesity- ongoing issue for pt.  She is down 4 lbs since last visit.    'I have that sinus thing going on'- sore throat, achy, nasal congestion, HA.  sxs started 'over the weekend'.  Using nasal sprays and Claritin for allergies.  No fevers.   Review of Systems For ROS see HPI     Objective:   Physical Exam Vitals signs reviewed.  Constitutional:      General: She is not in acute distress.    Appearance: Normal appearance. She is well-developed. She is obese.  HENT:     Head: Normocephalic and atraumatic.     Right Ear: Tympanic membrane normal.     Left Ear: Tympanic membrane normal.     Nose: Mucosal edema and rhinorrhea present.     Right Sinus: Maxillary sinus tenderness and frontal sinus tenderness present.     Left Sinus: Maxillary sinus tenderness and frontal sinus tenderness present.     Mouth/Throat:     Pharynx: Uvula midline. Posterior oropharyngeal erythema present. No oropharyngeal exudate.  Eyes:     Conjunctiva/sclera: Conjunctivae normal.     Pupils: Pupils are equal, round, and reactive to light.  Neck:     Musculoskeletal: Normal range of motion and neck supple.  Cardiovascular:     Rate and Rhythm: Normal rate and regular rhythm.     Heart sounds: Normal heart sounds.  Pulmonary:     Effort: Pulmonary effort is normal. No respiratory distress.     Breath sounds: Normal breath sounds. No wheezing.  Musculoskeletal:     Right lower leg: Edema (nonpitting) present.     Left lower leg: Edema (nonpitting) present.  Lymphadenopathy:      Cervical: No cervical adenopathy.  Neurological:     General: No focal deficit present.     Mental Status: She is alert and oriented to person, place, and time.  Psychiatric:        Mood and Affect: Mood normal.        Behavior: Behavior normal.        Thought Content: Thought content normal.           Assessment & Plan:

## 2018-02-11 NOTE — Assessment & Plan Note (Signed)
Chronic problem.  Adequate but not ideal control.  Given age, and risk for falls, I hesitate to lower BP any further.  Pt has changed her diet and is losing weight, which may impact BP.  Will follow closely.

## 2018-02-18 ENCOUNTER — Ambulatory Visit: Payer: Medicare Other | Attending: Family Medicine | Admitting: Physical Therapy

## 2018-02-18 DIAGNOSIS — M25612 Stiffness of left shoulder, not elsewhere classified: Secondary | ICD-10-CM | POA: Diagnosis not present

## 2018-02-18 DIAGNOSIS — M25562 Pain in left knee: Secondary | ICD-10-CM | POA: Insufficient documentation

## 2018-02-18 DIAGNOSIS — M25512 Pain in left shoulder: Secondary | ICD-10-CM | POA: Insufficient documentation

## 2018-02-18 DIAGNOSIS — R262 Difficulty in walking, not elsewhere classified: Secondary | ICD-10-CM | POA: Diagnosis not present

## 2018-02-18 DIAGNOSIS — G8929 Other chronic pain: Secondary | ICD-10-CM | POA: Diagnosis not present

## 2018-02-18 NOTE — Therapy (Signed)
[place], [place], [address] Phone: [phone]   Fax:  [phone]  Physical Therapy Treatment  Patient Details  Name: Kristen Manning MRN: [id number] Date of Birth: 1932-09-14 Referring Provider (PT): Pamella Pert   Encounter Date: 02/18/2018  PT End of Session - 02/18/18 0802    Visit Number  14    PT Start Time  0034    PT Stop Time  0850    PT Time Calculation (min)  52 min       Past Medical History:  Diagnosis Date  . Arthritis   . Bacterial infection    in lungs in Jan 2015  . Bilateral lower extremity edema   . Cancer ([place])    skin cancer, back, legs melonama, sees Dr Lisabeth Pick, derm  . Chronic back pain   . GERD (gastroesophageal reflux disease)    takes Omeprazole daily  . Headache(784.0)   . History of migraine    last one about 15 yrs ago  . Hyperlipidemia    takes Zetia daily  . Hypertension    takes HCTZ daily  . Joint pain   . Joint swelling   . Myocardial infarction [facility])    pt states EKG always shows infarct but no change from previous ekg;never knew anything about it  . Nocturia   . Osteoarthritis   . Pneumonia    hx of-many yrs ago  . Pneumonia   . PONV (postoperative nausea and vomiting)   . Shortness of breath    with exertion  . Urinary frequency   . Urinary incontinence   . Urinary urgency     Past Surgical History:  Procedure Laterality Date  . bladder tack  1998  . BLEPHAROPLASTY Bilateral   . JOINT REPLACEMENT Left 10/02/2009  . JOINT REPLACEMENT Right 02-22-13   Knee  . KNEE SURGERY Bilateral 2009 and 2011  . OOPHORECTOMY  1998   only one  . TONSILLECTOMY  as a child ? date  . TOTAL KNEE ARTHROPLASTY Right 02/22/2013   DR Ronnie Derby  . TOTAL KNEE ARTHROPLASTY Right 02/22/2013   Procedure: TOTAL KNEE ARTHROPLASTY;  Surgeon: Vickey Huger, MD;  Location: [facility];  Service: Orthopedics;  Laterality: Right;    There were no vitals filed for this  visit.  Subjective Assessment - 02/18/18 0758    Subjective  Pt has not been out of town caring for sick son. Worked on [employer]. Pain no worse " nagging" and hurts with movement, limiting ADLs    Currently in Pain?  Yes    Pain Score  3     Pain Location  Shoulder    Pain Orientation  Left         [facility] PT Assessment - 02/18/18 0001      AROM   Left Shoulder Flexion  100 Degrees    Left Shoulder ABduction  90 Degrees    Left Shoulder Internal Rotation  32 Degrees    Left Shoulder External Rotation  55 Degrees                   [facility] Adult PT Treatment/Exercise - 02/18/18 0001      Standardized Balance Assessment   Standardized Balance Assessment  Berg Balance Test      Berg Balance Test   Sit to Stand  Able to stand  independently using hands    Standing Unsupported  Able to stand safely 2 minutes  Sitting with Back Unsupported but Feet Supported on Floor or Stool  Able to sit safely and securely 2 minutes    Stand to Sit  Sits safely with minimal use of hands    Transfers  Able to transfer safely, minor use of hands    Standing Unsupported with Eyes Closed  Able to stand 10 seconds with supervision    Standing Ubsupported with Feet Together  Able to place feet together independently and stand for 1 minute with supervision    From Standing, Reach Forward with Outstretched Arm  Can reach forward >12 cm safely (5")    From Standing Position, Pick up Object from Floor  Able to pick up shoe, needs supervision    From Standing Position, Turn to Look Behind Over each Shoulder  Looks behind one side only/other side shows less weight shift    Turn 360 Degrees  Needs close supervision or verbal cueing    Standing Unsupported, Alternately Place Feet on Step/Stool  Needs assistance to keep from falling or unable to try    Standing Unsupported, One Foot in [employer] balance while stepping or standing    Standing on One Leg  Tries to lift leg/unable to hold 3 seconds but  remains standing independently    Total Score  36      Knee/Hip Exercises: Aerobic   Nustep  Level 4 x 6 min      Shoulder Exercises: ROM/Strengthening   UBE (Upper Arm Bike)  L 3 2 fwd/2 back    Lat Pull  20 reps   20#   Cybex Row  20 reps   20#     Moist Heat Therapy   Number Minutes Moist Heat  15 Minutes    Moist Heat Location  Shoulder      Electrical Stimulation   Electrical Stimulation Location  L shoulder    Electrical Stimulation Action  IFC    Electrical Stimulation Parameters  sitting    Electrical Stimulation Goals  Pain               PT Short Term Goals - 12/12/17 0933      PT SHORT TERM GOAL #1   Title  independent with initial HEP    Time  2    Period  Weeks    Status  Achieved        PT Long Term Goals - 02/18/18 4332      PT LONG TERM GOAL #7   Title  increase Berg balance score to 45/56    Baseline  36/56    Status  On-going            Plan - 02/18/18 0815    Clinical Impression Statement  pt has not been since in PT for a month d/t caring for son post surgery. pt very doing HEP but same nagging pain and limited ADLS. Pt ROM is bettter than at eval in Nov but regressed since last visit is Jan. TUG has increased and gait is very short shuffled steps making pt fall risk and BERG is 36/56 increasing fall risk. Started back to ex todaya dn tolerated well but pt will benefit from continued PT to maximize func, ROM and safety    PT Treatment/Interventions  ADLs/Self Care Home Management;Cryotherapy;Electrical Stimulation;Iontophoresis 4mg /ml Dexamethasone;Moist Heat;Ultrasound;Therapeutic exercise;Therapeutic activities;Patient/family education;Manual techniques;Dry needling;Gait training;Balance training;Functional mobility training;Stair training    PT Next Visit Plan  Progress Strength, ROM ,Balance and Gait. Modlaities for shld PRN  Patient will benefit from skilled therapeutic intervention in order to improve the following  deficits and impairments:  Abnormal gait, Decreased coordination, Decreased range of motion, Difficulty walking, Impaired UE functional use, Increased muscle spasms, Pain, Improper body mechanics, Decreased mobility, Decreased strength, Postural dysfunction  Visit Diagnosis: Stiffness of left shoulder, not elsewhere classified  Acute pain of left shoulder  Chronic pain of left knee  Difficulty in walking, not elsewhere classified     Problem List Patient Active Problem List   Diagnosis Date Noted  . Morbid obesity ([place]) 02/11/2018  . Muscle spasm 10/28/2016  . DDD (degenerative disc disease), lumbar 10/07/2016  . Primary osteoarthritis of both hands 10/07/2016  . Primary osteoarthritis of both feet 10/07/2016  . Tension-type headache, not intractable 09/16/2016  . GERD (gastroesophageal reflux disease) 09/11/2016  . ANA positive 09/02/2016  . Myalgia 06/26/2016  . Shortness of breath 03/22/2015  . History of total knee replacement, bilateral 03/20/2015  . Hypothyroidism 09/21/2014  . Cephalalgia 09/15/2014  . Gout 06/20/2014  . Lower leg edema 06/13/2014  . Pain in both feet 06/13/2014  . Chest wall pain 04/29/2014  . Meningioma ([place]) 10/07/2013  . Chronic tension-type headache, intractable 10/07/2013  . Pain of right lower leg 08/11/2013  . Neck pain 07/28/2013  . Swelling of limb 07/07/2013  . Headache(784.0) 07/07/2013  . Depression 06/23/2013  . Insomnia 05/26/2013  . Routine general medical examination at a health care facility 10/17/2011  . HTN (hypertension) 07/03/2011  . Hyperlipidemia 07/03/2011  . Venous insufficiency, peripheral 07/03/2011    Azeez Dunker,ANGIE PTA 02/18/2018, 8:24 AM  [address] [place], [place], [address] Phone: [phone]   Fax:  [phone]  Name: Kristen Manning MRN: [id number] Date of Birth: March 05, 1932

## 2018-02-20 ENCOUNTER — Other Ambulatory Visit: Payer: Self-pay | Admitting: Family Medicine

## 2018-02-24 ENCOUNTER — Encounter: Payer: Self-pay | Admitting: Physical Therapy

## 2018-02-24 ENCOUNTER — Ambulatory Visit: Payer: Medicare Other | Admitting: Physical Therapy

## 2018-02-24 DIAGNOSIS — M25562 Pain in left knee: Secondary | ICD-10-CM

## 2018-02-24 DIAGNOSIS — G8929 Other chronic pain: Secondary | ICD-10-CM

## 2018-02-24 DIAGNOSIS — M25512 Pain in left shoulder: Secondary | ICD-10-CM | POA: Diagnosis not present

## 2018-02-24 DIAGNOSIS — M25612 Stiffness of left shoulder, not elsewhere classified: Secondary | ICD-10-CM | POA: Diagnosis not present

## 2018-02-24 DIAGNOSIS — R262 Difficulty in walking, not elsewhere classified: Secondary | ICD-10-CM

## 2018-02-24 NOTE — Therapy (Signed)
[place], [place], [address] Phone: [phone]   Fax:  [phone]  Physical Therapy Treatment  Patient Details  Name: Kristen Manning MRN: [id number] Date of Birth: 04-Feb-1932 Referring Provider (PT): Pamella Pert   Encounter Date: 02/24/2018  PT End of Session - 02/24/18 1014    Visit Number  15    Date for PT Re-Evaluation  03/19/18    PT Start Time  0939    PT Stop Time  1028    PT Time Calculation (min)  49 min    Activity Tolerance  Patient tolerated treatment well    Behavior During Therapy  [facility] for tasks assessed/performed       Past Medical History:  Diagnosis Date  . Arthritis   . Bacterial infection    in lungs in Jan 2015  . Bilateral lower extremity edema   . Cancer ([place])    skin cancer, back, legs melonama, sees Dr Lisabeth Pick, derm  . Chronic back pain   . GERD (gastroesophageal reflux disease)    takes Omeprazole daily  . Headache(784.0)   . History of migraine    last one about 15 yrs ago  . Hyperlipidemia    takes Zetia daily  . Hypertension    takes HCTZ daily  . Joint pain   . Joint swelling   . Myocardial infarction [facility])    pt states EKG always shows infarct but no change from previous ekg;never knew anything about it  . Nocturia   . Osteoarthritis   . Pneumonia    hx of-many yrs ago  . Pneumonia   . PONV (postoperative nausea and vomiting)   . Shortness of breath    with exertion  . Urinary frequency   . Urinary incontinence   . Urinary urgency     Past Surgical History:  Procedure Laterality Date  . bladder tack  1998  . BLEPHAROPLASTY Bilateral   . JOINT REPLACEMENT Left 10/02/2009  . JOINT REPLACEMENT Right 02-22-13   Knee  . KNEE SURGERY Bilateral 2009 and 2011  . OOPHORECTOMY  1998   only one  . TONSILLECTOMY  as a child ? date  . TOTAL KNEE ARTHROPLASTY Right 02/22/2013   DR Ronnie Derby  . TOTAL KNEE ARTHROPLASTY Right 02/22/2013   Procedure:  TOTAL KNEE ARTHROPLASTY;  Surgeon: Vickey Huger, MD;  Location: [place];  Service: Orthopedics;  Laterality: Right;    There were no vitals filed for this visit.  Subjective Assessment - 02/24/18 0939    Subjective  Pt reports that she is tired, Nagging pain in her L shoulder    Currently in Pain?  Yes    Pain Score  4     Pain Location  Shoulder    Pain Orientation  Left                       [facility] Adult PT Treatment/Exercise - 02/24/18 0001      High Level Balance   High Level Balance Activities  Side stepping      Knee/Hip Exercises: Aerobic   Nustep  Level 4 x 6 min      Knee/Hip Exercises: Machines for Strengthening   Cybex Knee Extension  5# 2 sets 10    Cybex Knee Flexion  20# 2 sets 10      Knee/Hip Exercises: Seated   Sit to Sand  5 reps;without UE support;with UE support;2 sets  Shoulder Exercises: ROM/Strengthening   UBE (Upper Arm Bike)  L 3 2 fwd/2 back    Lat Pull  20 reps   20lb   Cybex Row  20 reps   20lb     Moist Heat Therapy   Number Minutes Moist Heat  15 Minutes    Moist Heat Location  Shoulder      Electrical Stimulation   Electrical Stimulation Location  L shoulder    Electrical Stimulation Action  IFC    Electrical Stimulation Parameters  sitting     Electrical Stimulation Goals  Pain               PT Short Term Goals - 12/12/17 0933      PT SHORT TERM GOAL #1   Title  independent with initial HEP    Time  2    Period  Weeks    Status  Achieved        PT Long Term Goals - 02/24/18 1015      PT LONG TERM GOAL #2   Title  increase left shoulder flexion to 100 degrees    Status  Partially Met      PT LONG TERM GOAL #3   Title  repoort that she can vacuum without difficulty    Status  Partially Met            Plan - 02/24/18 1015    Clinical Impression Statement  Pt ~ 9 minutes late for today's treatment session. increase weight tolerated on LE exercises. pt able to progress and perform sit to  stop stand without UE. Good stability with side stepping. Pt ambulated with a slow shuffling gait.    PT Frequency  2x / week    PT Duration  8 weeks    PT Treatment/Interventions  ADLs/Self Care Home Management;Cryotherapy;Electrical Stimulation;Iontophoresis 49m/ml Dexamethasone;Moist Heat;Ultrasound;Therapeutic exercise;Therapeutic activities;Patient/family education;Manual techniques;Dry needling;Gait training;Balance training;Functional mobility training;Stair training    PT Next Visit Plan  Progress Strength, ROM ,Balance and Gait. Modlaities for shld PRN       Patient will benefit from skilled therapeutic intervention in order to improve the following deficits and impairments:  Abnormal gait, Decreased coordination, Decreased range of motion, Difficulty walking, Impaired UE functional use, Increased muscle spasms, Pain, Improper body mechanics, Decreased mobility, Decreased strength, Postural dysfunction  Visit Diagnosis: Stiffness of left shoulder, not elsewhere classified  Acute pain of left shoulder  Chronic pain of left knee  Difficulty in walking, not elsewhere classified     Problem List Patient Active Problem List   Diagnosis Date Noted  . Morbid [place] 02/11/2018  . Muscle spasm 10/28/2016  . DDD (degenerative disc disease), lumbar 10/07/2016  . Primary osteoarthritis of both hands 10/07/2016  . Primary osteoarthritis of both feet 10/07/2016  . Tension-type headache, not intractable 09/16/2016  . GERD (gastroesophageal reflux disease) 09/11/2016  . ANA positive 09/02/2016  . Myalgia 06/26/2016  . Shortness of breath 03/22/2015  . History of total knee replacement, bilateral 03/20/2015  . Hypothyroidism 09/21/2014  . Cephalalgia 09/15/2014  . Gout 06/20/2014  . Lower leg edema 06/13/2014  . Pain in both feet 06/13/2014  . Chest wall pain 04/29/2014  . Meningioma ([place] 10/07/2013  . Chronic tension-type headache, intractable 10/07/2013  . Pain of right  lower leg 08/11/2013  . Neck pain 07/28/2013  . Swelling of limb 07/07/2013  . Headache(784.0) 07/07/2013  . Depression 06/23/2013  . Insomnia 05/26/2013  . Routine general medical examination at a health care  facility 10/17/2011  . HTN (hypertension) 07/03/2011  . Hyperlipidemia 07/03/2011  . Venous insufficiency, peripheral 07/03/2011    Scot Jun, PTA 02/24/2018, 10:23 AM  [place] [place], [place], [address] Phone: [phone]   Fax:  [phone]  Name: MADORA BARLETTA MRN: [id number] Date of Birth: [birth year]

## 2018-02-26 ENCOUNTER — Encounter: Payer: Self-pay | Admitting: Physical Therapy

## 2018-02-26 ENCOUNTER — Ambulatory Visit: Payer: Medicare Other | Admitting: Physical Therapy

## 2018-02-26 DIAGNOSIS — G8929 Other chronic pain: Secondary | ICD-10-CM

## 2018-02-26 DIAGNOSIS — M25512 Pain in left shoulder: Secondary | ICD-10-CM

## 2018-02-26 DIAGNOSIS — M25562 Pain in left knee: Secondary | ICD-10-CM | POA: Diagnosis not present

## 2018-02-26 DIAGNOSIS — M25612 Stiffness of left shoulder, not elsewhere classified: Secondary | ICD-10-CM | POA: Diagnosis not present

## 2018-02-26 DIAGNOSIS — R262 Difficulty in walking, not elsewhere classified: Secondary | ICD-10-CM | POA: Diagnosis not present

## 2018-02-26 NOTE — Therapy (Signed)
[place], [place], [address] Phone: [phone]   Fax:  [phone]  Physical Therapy Treatment  Patient Details  Name: Kristen Manning MRN: [id number] Date of Birth: [birth year] Referring Provider (PT): Pamella Pert   Encounter Date: 02/26/2018  PT End of Session - 02/26/18 1013    Visit Number  16    Date for PT Re-Evaluation  03/19/18    PT Start Time  0930    PT Stop Time  1024    PT Time Calculation (min)  54 min    Activity Tolerance  Patient tolerated treatment well    Behavior During Therapy  [facility] for tasks assessed/performed       Past Medical History:  Diagnosis Date  . Arthritis   . Bacterial infection    in lungs in Jan 2015  . Bilateral lower extremity edema   . Cancer ([place])    skin cancer, back, legs melonama, sees Dr Lisabeth Pick, derm  . Chronic back pain   . GERD (gastroesophageal reflux disease)    takes Omeprazole daily  . Headache(784.0)   . History of migraine    last one about 15 yrs ago  . Hyperlipidemia    takes Zetia daily  . Hypertension    takes HCTZ daily  . Joint pain   . Joint swelling   . Myocardial infarction [facility])    pt states EKG always shows infarct but no change from previous ekg;never knew anything about it  . Nocturia   . Osteoarthritis   . Pneumonia    hx of-many yrs ago  . Pneumonia   . PONV (postoperative nausea and vomiting)   . Shortness of breath    with exertion  . Urinary frequency   . Urinary incontinence   . Urinary urgency     Past Surgical History:  Procedure Laterality Date  . bladder tack  1998  . BLEPHAROPLASTY Bilateral   . JOINT REPLACEMENT Left 10/02/2009  . JOINT REPLACEMENT Right 02-22-13   Knee  . KNEE SURGERY Bilateral 2009 and 2011  . OOPHORECTOMY  1998   only one  . TONSILLECTOMY  as a child ? date  . TOTAL KNEE ARTHROPLASTY Right 02/22/2013   DR Ronnie Derby  . TOTAL KNEE ARTHROPLASTY Right 02/22/2013   Procedure:  TOTAL KNEE ARTHROPLASTY;  Surgeon: Vickey Huger, MD;  Location: [place];  Service: Orthopedics;  Laterality: Right;    There were no vitals filed for this visit.  Subjective Assessment - 02/26/18 0931    Subjective  "Im hanging in their honey "     Currently in Pain?  Yes    Pain Score  3     Pain Location  Shoulder    Pain Orientation  Left    Pain Descriptors / Indicators  Nagging         [facility] PT Assessment - 02/26/18 0001      Assessment   Medical Diagnosis  bilateral knee pain and difficulty walking    Referring Provider (PT)  Pamella Pert    Onset Date/Surgical Date  12/01/17                   [facility] Adult PT Treatment/Exercise - 02/26/18 0001      High Level Balance   High Level Balance Activities  Side stepping      Knee/Hip Exercises: Aerobic   Nustep  Level 4 x 6 min      Knee/Hip Exercises: Standing  Other Standing Knee Exercises  standing march 2x10       Knee/Hip Exercises: Seated   Sit to Sand  5 reps;2 sets;10 reps;without UE support      Shoulder Exercises: Standing   External Rotation  Strengthening;Left;Theraband;20 reps    Theraband Level (Shoulder External Rotation)  Level 1 (Yellow)    Internal Rotation  Strengthening;Left;20 reps;Theraband    Theraband Level (Shoulder Internal Rotation)  Level 1 (Yellow)    Extension  Strengthening;Both;20 reps;Theraband    Theraband Level (Shoulder Extension)  Level 3 (Green)    Medical sales representative;Theraband;20 reps;Both    Theraband Level (Shoulder Row)  Level 3 (Green)      Shoulder Exercises: ROM/Strengthening   UBE (Upper Arm Bike)  L 3 2 fwd/2 back      Moist Heat Therapy   Number Minutes Moist Heat  15 Minutes    Moist Heat Location  Shoulder      Electrical Stimulation   Electrical Stimulation Location  L shoulder    Electrical Stimulation Action  IFC    Electrical Stimulation Parameters  sitting    Electrical Stimulation Goals  Pain               PT Short Term Goals - 12/12/17  0933      PT SHORT TERM GOAL #1   Title  independent with initial HEP    Time  2    Period  Weeks    Status  Achieved        PT Long Term Goals - 02/24/18 1015      PT LONG TERM GOAL #2   Title  increase left shoulder flexion to 100 degrees    Status  Partially Met      PT LONG TERM GOAL #3   Title  repoort that she can vacuum without difficulty    Status  Partially Met            Plan - 02/26/18 1014    Clinical Impression Statement  HHA x 2 needed with side stepping. Tactile cues to keep L arm by her side with internal and external rotation. Cues to lean forward with sit to stands, prior to cues pt would push her LE into mat table to stand. Pt reports L shoulder nagging pain always.    PT Frequency  2x / week    PT Duration  8 weeks    PT Treatment/Interventions  ADLs/Self Care Home Management;Cryotherapy;Electrical Stimulation;Iontophoresis 39m/ml Dexamethasone;Moist Heat;Ultrasound;Therapeutic exercise;Therapeutic activities;Patient/family education;Manual techniques;Dry needling;Gait training;Balance training;Functional mobility training;Stair training    PT Next Visit Plan  Progress Strength, ROM ,Balance and Gait. Modlaities for shld PRN       Patient will benefit from skilled therapeutic intervention in order to improve the following deficits and impairments:  Abnormal gait, Decreased coordination, Decreased range of motion, Difficulty walking, Impaired UE functional use, Increased muscle spasms, Pain, Improper body mechanics, Decreased mobility, Decreased strength, Postural dysfunction  Visit Diagnosis: Stiffness of left shoulder, not elsewhere classified  Chronic pain of left knee  Acute pain of left shoulder  Difficulty in walking, not elsewhere classified     Problem List Patient Active Problem List   Diagnosis Date Noted  . Morbid obesity ([place] 02/11/2018  . Muscle spasm 10/28/2016  . DDD (degenerative disc disease), lumbar 10/07/2016  . Primary  osteoarthritis of both hands 10/07/2016  . Primary osteoarthritis of both feet 10/07/2016  . Tension-type headache, not intractable 09/16/2016  . GERD (gastroesophageal reflux disease) 09/11/2016  .  ANA positive 09/02/2016  . Myalgia 06/26/2016  . Shortness of breath 03/22/2015  . History of total knee replacement, bilateral 03/20/2015  . Hypothyroidism 09/21/2014  . Cephalalgia 09/15/2014  . Gout 06/20/2014  . Lower leg edema 06/13/2014  . Pain in both feet 06/13/2014  . Chest wall pain 04/29/2014  . Meningioma ([place]) 10/07/2013  . Chronic tension-type headache, intractable 10/07/2013  . Pain of right lower leg 08/11/2013  . Neck pain 07/28/2013  . Swelling of limb 07/07/2013  . Headache(784.0) 07/07/2013  . Depression 06/23/2013  . Insomnia 05/26/2013  . Routine general medical examination at a health care facility 10/17/2011  . HTN (hypertension) 07/03/2011  . Hyperlipidemia 07/03/2011  . Venous insufficiency, peripheral 07/03/2011    Scot Jun, PTA 02/26/2018, 11:24 AM  [address] [place], [place], [address] Phone: [phone]   Fax:  [phone]  Name: Kristen Manning MRN: [id number] Date of Birth: 05/06/1932

## 2018-03-03 ENCOUNTER — Ambulatory Visit: Payer: Medicare Other | Admitting: Physical Therapy

## 2018-03-05 ENCOUNTER — Ambulatory Visit: Payer: Medicare Other | Admitting: Physical Therapy

## 2018-03-05 DIAGNOSIS — M25562 Pain in left knee: Secondary | ICD-10-CM | POA: Diagnosis not present

## 2018-03-05 DIAGNOSIS — M25512 Pain in left shoulder: Secondary | ICD-10-CM

## 2018-03-05 DIAGNOSIS — G8929 Other chronic pain: Secondary | ICD-10-CM

## 2018-03-05 DIAGNOSIS — M25612 Stiffness of left shoulder, not elsewhere classified: Secondary | ICD-10-CM | POA: Diagnosis not present

## 2018-03-05 DIAGNOSIS — R262 Difficulty in walking, not elsewhere classified: Secondary | ICD-10-CM

## 2018-03-05 NOTE — Therapy (Signed)
[place], [place], [address] Phone: [phone]   Fax:  [phone]  Physical Therapy Treatment  Patient Details  Name: Kristen Manning MRN: [id number] Date of Birth: [birth year] Referring Provider (PT): Pamella Pert   Encounter Date: 03/05/2018  PT End of Session - 03/05/18 0904    Visit Number  17    Date for PT Re-Evaluation  03/19/18    PT Start Time  0840    PT Stop Time  0935    PT Time Calculation (min)  55 min       Past Medical History:  Diagnosis Date  . Arthritis   . Bacterial infection    in lungs in Jan 2015  . Bilateral lower extremity edema   . Cancer ([place])    skin cancer, back, legs melonama, sees Dr Lisabeth Pick, derm  . Chronic back pain   . GERD (gastroesophageal reflux disease)    takes Omeprazole daily  . Headache(784.0)   . History of migraine    last one about 15 yrs ago  . Hyperlipidemia    takes Zetia daily  . Hypertension    takes HCTZ daily  . Joint pain   . Joint swelling   . Myocardial infarction [facility])    pt states EKG always shows infarct but no change from previous ekg;never knew anything about it  . Nocturia   . Osteoarthritis   . Pneumonia    hx of-many yrs ago  . Pneumonia   . PONV (postoperative nausea and vomiting)   . Shortness of breath    with exertion  . Urinary frequency   . Urinary incontinence   . Urinary urgency     Past Surgical History:  Procedure Laterality Date  . bladder tack  1998  . BLEPHAROPLASTY Bilateral   . JOINT REPLACEMENT Left 10/02/2009  . JOINT REPLACEMENT Right 02-22-13   Knee  . KNEE SURGERY Bilateral 2009 and 2011  . OOPHORECTOMY  1998   only one  . TONSILLECTOMY  as a child ? date  . TOTAL KNEE ARTHROPLASTY Right 02/22/2013   DR Ronnie Derby  . TOTAL KNEE ARTHROPLASTY Right 02/22/2013   Procedure: TOTAL KNEE ARTHROPLASTY;  Surgeon: Vickey Huger, MD;  Location: [place];  Service: Orthopedics;  Laterality: Right;     There were no vitals filed for this visit.  Subjective Assessment - 03/05/18 0840    Subjective  "just can not shake this respitory junk" shld is awful esp with cold weather. amb in without AD with shuffled gait    Currently in Pain?  Yes    Pain Score  5     Pain Location  Shoulder    Pain Orientation  Left                       [facility] Adult PT Treatment/Exercise - 03/05/18 0001      Knee/Hip Exercises: Aerobic   Nustep  L 3 6 min   1 rest d/t SOB     Knee/Hip Exercises: Machines for Strengthening   Cybex Knee Extension  5# 2 sets 10    Cybex Knee Flexion  20# 2 sets 10      Knee/Hip Exercises: Standing   Other Standing Knee Exercises  red tband hip 3 way plus march 10 each   very difficult SOB and weakness     Shoulder Exercises: Standing   External Rotation  Strengthening;Left;Theraband;20 reps    Theraband  Level (Shoulder External Rotation)  Level 1 (Yellow)    Internal Rotation  Strengthening;Left;20 reps;Theraband    Theraband Level (Shoulder Internal Rotation)  Level 1 (Yellow)    Extension  Strengthening;Both;20 reps;Theraband    Theraband Level (Shoulder Extension)  Level 3 (Green)    Medical sales representative;Theraband;20 reps;Both    Theraband Level (Shoulder Row)  Level 3 (Green)      Shoulder Exercises: ROM/Strengthening   UBE (Upper Arm Bike)  L 3 2 fwd/2 back    Lat Pull  20 reps   20#   Cybex Row  20 reps   20#     Moist Heat Therapy   Number Minutes Moist Heat  15 Minutes    Moist Heat Location  Shoulder      Electrical Stimulation   Electrical Stimulation Location  L shoulder    Electrical Stimulation Action  IFC    Electrical Stimulation Parameters  sitting    Electrical Stimulation Goals  Pain               PT Short Term Goals - 12/12/17 0933      PT SHORT TERM GOAL #1   Title  independent with initial HEP    Time  2    Period  Weeks    Status  Achieved        PT Long Term Goals - 03/05/18 0844      PT LONG  TERM GOAL #2   Title  increase left shoulder flexion to 100 degrees    Status  Partially Met      PT LONG TERM GOAL #3   Title  repoort that she can vacuum without difficulty    Status  Partially Met      PT LONG TERM GOAL #4   Title  report able to dress without difficulty    Status  Partially Met      PT LONG TERM GOAL #5   Title  report able to do hair without difficulty    Status  Partially Met      PT LONG TERM GOAL #6   Title  decreaes TUG to 18 seconds    Baseline  20 sec and SOB    Status  Partially Met      PT LONG TERM GOAL #7   Title  increase Berg balance score to 45/56    Status  On-going            Plan - 03/05/18 0904    Clinical Impression Statement  pt arrived after a week d/t illness and increased SOB with no energy. focus on LE and left shld strength, encouraging full ROM with LE ex as she shuffles and has limited LE ROM with gait. no goals met this week.    PT Treatment/Interventions  ADLs/Self Care Home Management;Cryotherapy;Electrical Stimulation;Iontophoresis 4m/ml Dexamethasone;Moist Heat;Ultrasound;Therapeutic exercise;Therapeutic activities;Patient/family education;Manual techniques;Dry needling;Gait training;Balance training;Functional mobility training;Stair training    PT Next Visit Plan  Progress Strength, ROM ,Balance and Gait. Modlaities for shld PRN       Patient will benefit from skilled therapeutic intervention in order to improve the following deficits and impairments:  Abnormal gait, Decreased coordination, Decreased range of motion, Difficulty walking, Impaired UE functional use, Increased muscle spasms, Pain, Improper body mechanics, Decreased mobility, Decreased strength, Postural dysfunction  Visit Diagnosis: Stiffness of left shoulder, not elsewhere classified  Chronic pain of left knee  Acute pain of left shoulder  Difficulty in walking, not elsewhere classified  Problem List Patient Active Problem List   Diagnosis  Date Noted  . Morbid obesity ([place]) 02/11/2018  . Muscle spasm 10/28/2016  . DDD (degenerative disc disease), lumbar 10/07/2016  . Primary osteoarthritis of both hands 10/07/2016  . Primary osteoarthritis of both feet 10/07/2016  . Tension-type headache, not intractable 09/16/2016  . GERD (gastroesophageal reflux disease) 09/11/2016  . ANA positive 09/02/2016  . Myalgia 06/26/2016  . Shortness of breath 03/22/2015  . History of total knee replacement, bilateral 03/20/2015  . Hypothyroidism 09/21/2014  . Cephalalgia 09/15/2014  . Gout 06/20/2014  . Lower leg edema 06/13/2014  . Pain in both feet 06/13/2014  . Chest wall pain 04/29/2014  . Meningioma ([place]) 10/07/2013  . Chronic tension-type headache, intractable 10/07/2013  . Pain of right lower leg 08/11/2013  . Neck pain 07/28/2013  . Swelling of limb 07/07/2013  . Headache(784.0) 07/07/2013  . Depression 06/23/2013  . Insomnia 05/26/2013  . Routine general medical examination at a health care facility 10/17/2011  . HTN (hypertension) 07/03/2011  . Hyperlipidemia 07/03/2011  . Venous insufficiency, peripheral 07/03/2011    PAYSEUR,ANGIE PTA 03/05/2018, 9:07 AM  [place] [address] [place], [place], [address] Phone: [phone]   Fax:  [phone]  Name: Kristen Manning MRN: [id number] Date of Birth: 1932/02/11

## 2018-03-10 ENCOUNTER — Institutional Professional Consult (permissible substitution) (INDEPENDENT_AMBULATORY_CARE_PROVIDER_SITE_OTHER): Payer: Medicare Other | Admitting: Cardiothoracic Surgery

## 2018-03-10 ENCOUNTER — Other Ambulatory Visit: Payer: Self-pay

## 2018-03-10 ENCOUNTER — Encounter: Payer: Self-pay | Admitting: Cardiothoracic Surgery

## 2018-03-10 ENCOUNTER — Other Ambulatory Visit: Payer: Self-pay | Admitting: *Deleted

## 2018-03-10 ENCOUNTER — Emergency Department (HOSPITAL_BASED_OUTPATIENT_CLINIC_OR_DEPARTMENT_OTHER): Payer: Medicare Other

## 2018-03-10 ENCOUNTER — Ambulatory Visit: Payer: Medicare Other | Admitting: Physical Therapy

## 2018-03-10 ENCOUNTER — Emergency Department (HOSPITAL_BASED_OUTPATIENT_CLINIC_OR_DEPARTMENT_OTHER)
Admission: EM | Admit: 2018-03-10 | Discharge: 2018-03-10 | Disposition: A | Discharge status: Home / Self Care | Payer: Medicare Other | Attending: Emergency Medicine | Admitting: Emergency Medicine

## 2018-03-10 ENCOUNTER — Encounter (HOSPITAL_BASED_OUTPATIENT_CLINIC_OR_DEPARTMENT_OTHER): Payer: Self-pay | Admitting: Radiology

## 2018-03-10 VITALS — BP 185/95 | HR 107 | Resp 20 | Ht 63.0 in | Wt 214.6 lb

## 2018-03-10 DIAGNOSIS — R072 Precordial pain: Secondary | ICD-10-CM | POA: Diagnosis not present

## 2018-03-10 DIAGNOSIS — Z96651 Presence of right artificial knee joint: Secondary | ICD-10-CM | POA: Insufficient documentation

## 2018-03-10 DIAGNOSIS — Z79899 Other long term (current) drug therapy: Secondary | ICD-10-CM | POA: Diagnosis not present

## 2018-03-10 DIAGNOSIS — Z85828 Personal history of other malignant neoplasm of skin: Secondary | ICD-10-CM | POA: Insufficient documentation

## 2018-03-10 DIAGNOSIS — J9859 Other diseases of mediastinum, not elsewhere classified: Secondary | ICD-10-CM

## 2018-03-10 DIAGNOSIS — R079 Chest pain, unspecified: Secondary | ICD-10-CM

## 2018-03-10 DIAGNOSIS — R05 Cough: Secondary | ICD-10-CM | POA: Diagnosis not present

## 2018-03-10 DIAGNOSIS — R1013 Epigastric pain: Secondary | ICD-10-CM | POA: Insufficient documentation

## 2018-03-10 DIAGNOSIS — I252 Old myocardial infarction: Secondary | ICD-10-CM | POA: Diagnosis not present

## 2018-03-10 DIAGNOSIS — K5730 Diverticulosis of large intestine without perforation or abscess without bleeding: Secondary | ICD-10-CM | POA: Diagnosis not present

## 2018-03-10 DIAGNOSIS — K8020 Calculus of gallbladder without cholecystitis without obstruction: Secondary | ICD-10-CM | POA: Diagnosis not present

## 2018-03-10 DIAGNOSIS — R110 Nausea: Secondary | ICD-10-CM | POA: Diagnosis not present

## 2018-03-10 LAB — HEPATIC FUNCTION PANEL
ALT: 16 U/L (ref 0–44)
AST: 21 U/L (ref 15–41)
Albumin: 3.9 g/dL (ref 3.5–5.0)
Alkaline Phosphatase: 92 U/L (ref 38–126)
Bilirubin, Direct: 0.1 mg/dL (ref 0.0–0.2)
Indirect Bilirubin: 0.6 mg/dL (ref 0.3–0.9)
Total Bilirubin: 0.7 mg/dL (ref 0.3–1.2)
Total Protein: 7.7 g/dL (ref 6.5–8.1)

## 2018-03-10 LAB — BASIC METABOLIC PANEL
Anion gap: 8 (ref 5–15)
BUN: 16 mg/dL (ref 8–23)
CO2: 25 mmol/L (ref 22–32)
Calcium: 9 mg/dL (ref 8.9–10.3)
Chloride: 105 mmol/L (ref 98–111)
Creatinine, Ser: 1.1 mg/dL — ABNORMAL HIGH (ref 0.44–1.00)
GFR calc Af Amer: 54 mL/min — ABNORMAL LOW (ref >60)
GFR calc non Af Amer: 46 mL/min — ABNORMAL LOW (ref >60)
Glucose, Bld: 98 mg/dL (ref 70–99)
Potassium: 4.1 mmol/L (ref 3.5–5.1)
Sodium: 138 mmol/L (ref 135–145)

## 2018-03-10 LAB — PROTIME-INR
INR: 1.1 (ref 0.8–1.2)
Prothrombin Time: 13.8 seconds (ref 11.4–15.2)

## 2018-03-10 LAB — URINALYSIS, ROUTINE W REFLEX MICROSCOPIC
Bilirubin Urine: NEGATIVE
Glucose, UA: NEGATIVE mg/dL
Hgb urine dipstick: NEGATIVE
Ketones, ur: NEGATIVE mg/dL
Leukocytes,Ua: NEGATIVE
Nitrite: NEGATIVE
Protein, ur: NEGATIVE mg/dL
Specific Gravity, Urine: <1.005 — ABNORMAL LOW (ref 1.005–1.030)
pH: 7 (ref 5.0–8.0)

## 2018-03-10 LAB — CBC
HCT: 44.2 % (ref 36.0–46.0)
Hemoglobin: 13.9 g/dL (ref 12.0–15.0)
MCH: 28.9 pg (ref 26.0–34.0)
MCHC: 31.4 g/dL (ref 30.0–36.0)
MCV: 91.9 fL (ref 80.0–100.0)
Platelets: 316 10*3/uL (ref 150–400)
RBC: 4.81 MIL/uL (ref 3.87–5.11)
RDW: 13.4 % (ref 11.5–15.5)
WBC: 11.7 10*3/uL — ABNORMAL HIGH (ref 4.0–10.5)
nRBC: 0 % (ref 0.0–0.2)

## 2018-03-10 LAB — LIPASE, BLOOD: Lipase: 30 U/L (ref 11–51)

## 2018-03-10 LAB — TROPONIN I: Troponin I: <0.03 ng/mL (ref <0.03)

## 2018-03-10 MED ORDER — IOPAMIDOL (ISOVUE-370) INJECTION 76%
100.0000 mL | Freq: Once | INTRAVENOUS | Status: AC | PRN
  Administered 2018-03-10: 100 mL via INTRAVENOUS

## 2018-03-10 NOTE — ED Notes (Signed)
ED Provider at bedside. 

## 2018-03-10 NOTE — ED Triage Notes (Signed)
Pt states she has chest pain mid sternum since 12am. She states radiation to back.

## 2018-03-10 NOTE — ED Provider Notes (Signed)
[facility] EMERGENCY DEPARTMENT Provider Note   CSN: [id number] Arrival date & time: 03/10/18  8563    History   Chief Complaint Chief Complaint  Patient presents with  . Chest Pain    HPI Kristen Manning is a 85 y.o. female.     The history is provided by the patient and medical records. No language interpreter was used.  Chest Pain  Pain location:  Substernal area and epigastric Pain quality: aching   Pain quality: not crushing   Pain radiates to:  Upper back, mid back, L arm and L shoulder Pain severity:  Moderate Onset quality:  Sudden Duration:  8 hours Timing:  Constant Progression:  Improving Chronicity:  New Relieved by:  Nothing Worsened by:  Nothing Ineffective treatments:  None tried Associated symptoms: abdominal pain, back pain, cough and nausea   Associated symptoms: no altered mental status, no anorexia, no diaphoresis, no dizziness, no fatigue, no fever, no headache, no lower extremity edema (chronic), no numbness, no palpitations, no shortness of breath and no vomiting     Past Medical History:  Diagnosis Date  . Arthritis   . Bacterial infection    in lungs in Jan 2015  . Bilateral lower extremity edema   . Cancer ([place])    skin cancer, back, legs melonama, sees Dr Lisabeth Pick, derm  . Chronic back pain   . GERD (gastroesophageal reflux disease)    takes Omeprazole daily  . Headache(784.0)   . History of migraine    last one about 15 yrs ago  . Hyperlipidemia    takes Zetia daily  . Hypertension    takes HCTZ daily  . Joint pain   . Joint swelling   . Myocardial infarction [facility])    pt states EKG always shows infarct but no change from previous ekg;never knew anything about it  . Nocturia   . Osteoarthritis   . Pneumonia    hx of-many yrs ago  . Pneumonia   . PONV (postoperative nausea and vomiting)   . Shortness of breath    with exertion  . Urinary frequency   . Urinary incontinence   . Urinary urgency     Patient  Active Problem List   Diagnosis Date Noted  . Morbid obesity ([place]) 02/11/2018  . Muscle spasm 10/28/2016  . DDD (degenerative disc disease), lumbar 10/07/2016  . Primary osteoarthritis of both hands 10/07/2016  . Primary osteoarthritis of both feet 10/07/2016  . Tension-type headache, not intractable 09/16/2016  . GERD (gastroesophageal reflux disease) 09/11/2016  . ANA positive 09/02/2016  . Myalgia 06/26/2016  . Shortness of breath 03/22/2015  . History of total knee replacement, bilateral 03/20/2015  . Hypothyroidism 09/21/2014  . Cephalalgia 09/15/2014  . Gout 06/20/2014  . Lower leg edema 06/13/2014  . Pain in both feet 06/13/2014  . Chest wall pain 04/29/2014  . Meningioma ([place]) 10/07/2013  . Chronic tension-type headache, intractable 10/07/2013  . Pain of right lower leg 08/11/2013  . Neck pain 07/28/2013  . Swelling of limb 07/07/2013  . Headache(784.0) 07/07/2013  . Depression 06/23/2013  . Insomnia 05/26/2013  . Routine general medical examination at a health care facility 10/17/2011  . HTN (hypertension) 07/03/2011  . Hyperlipidemia 07/03/2011  . Venous insufficiency, peripheral 07/03/2011    Past Surgical History:  Procedure Laterality Date  . bladder tack  1998  . BLEPHAROPLASTY Bilateral   . JOINT REPLACEMENT Left 10/02/2009  . JOINT REPLACEMENT Right 02-22-13   Knee  . KNEE SURGERY  Bilateral 2009 and 2011  . OOPHORECTOMY  1998   only one  . TONSILLECTOMY  as a child ? date  . TOTAL KNEE ARTHROPLASTY Right 02/22/2013   DR Ronnie Derby  . TOTAL KNEE ARTHROPLASTY Right 02/22/2013   Procedure: TOTAL KNEE ARTHROPLASTY;  Surgeon: Vickey Huger, MD;  Location: [facility];  Service: Orthopedics;  Laterality: Right;     OB History   No obstetric history on file.      Home Medications    Prior to Admission medications   Medication Sig Start Date End Date Taking? Authorizing Provider  acetaminophen (TYLENOL) 500 MG tablet Take 1 tablet (500 mg total) by mouth every 8  (eight) hours as needed for moderate pain. 09/11/16   Midge Minium, MD  allopurinol (ZYLOPRIM) 300 MG tablet Take 300 mg by mouth daily. 06/27/15   [provider]  amoxicillin (AMOXIL) 875 MG tablet Take 1 tablet (875 mg total) by mouth 2 (two) times daily. 02/11/18   Midge Minium, MD  azelastine (ASTELIN) 0.1 % nasal spray Place 2 sprays into both nostrils 2 (two) times daily. Use in each nostril as directed 11/17/17   Rutherford Guys, MD  Calcium 600-200 MG-UNIT tablet Take 1 tablet by mouth daily.    [provider]  co-enzyme Q-10 30 MG capsule Take 30 mg by mouth daily.    [provider]  diclofenac sodium (VOLTAREN) 1 % GEL Apply 2 g topically 4 (four) times daily. 09/15/17   Rutherford Guys, MD  ezetimibe (ZETIA) 10 MG tablet TAKE 1 TABLET BY MOUTH ONCE DAILY 08/28/17   Midge Minium, MD  fenofibrate 160 MG tablet TAKE 1 TABLET BY MOUTH ONCE DAILY 09/23/17   Midge Minium, MD  furosemide (LASIX) 40 MG tablet Take 1 tablet (40 mg total) by mouth daily. 07/04/17   Lorretta Harp, MD  levothyroxine (SYNTHROID, LEVOTHROID) 50 MCG tablet TAKE 1 TABLET BY MOUTH ONCE DAILY 02/20/18   Midge Minium, MD  loratadine (CLARITIN) 10 MG tablet Take 1 tablet (10 mg total) by mouth daily. 01/16/17   Midge Minium, MD  NON FORMULARY Take 1 tablet by mouth daily.    [provider]  omeprazole (PRILOSEC) 20 MG capsule Take 20 mg by mouth daily.    [provider]  pregabalin (LYRICA) 50 MG capsule Take 1 capsule (50 mg total) by mouth 3 (three) times daily. 11/17/17   Rutherford Guys, MD  triamcinolone (NASACORT) 55 MCG/ACT AERO nasal inhaler Place 2 sprays into the nose daily. 11/17/17   Rutherford Guys, MD  triamcinolone cream (KENALOG) 0.1 % Apply 1 application topically 2 (two) times daily. For two weeks as needed 09/15/17   Rutherford Guys, MD    Family History Family History  Problem Relation Age of Onset  . Kidney  disease Mother   . Heart disease Mother   . Hypertension Mother   . Varicose Veins Mother   . Kidney failure Mother   . Alcohol abuse Father   . Stroke Father        several   . Hypertension Father   . Varicose Veins Father   . Alcohol abuse Brother   . Hyperlipidemia Brother   . Hypertension Brother   . Early death Brother   . Varicose Veins Brother   . Heart disease Brother   . Hypertension Brother   . Arthritis Brother        Gout  . Varicose Veins Son   .  Deep vein thrombosis Son   . Breast cancer Maternal Aunt     Social History Social History   Tobacco Use  . Smoking status: Never Smoker  . Smokeless tobacco: Never Used  Substance Use Topics  . Alcohol use: No  . Drug use: No     Allergies   Aspirin; Codeine; Statins; and Tizanidine hcl   Review of Systems Review of Systems  Constitutional: Negative for chills, diaphoresis, fatigue and fever.  HENT: Negative for congestion and rhinorrhea.   Respiratory: Positive for cough. Negative for chest tightness, shortness of breath, wheezing and stridor.   Cardiovascular: Positive for chest pain and leg swelling (chronic). Negative for palpitations.  Gastrointestinal: Positive for abdominal pain and nausea. Negative for anorexia, constipation, diarrhea and vomiting.  Genitourinary: Negative for enuresis, flank pain and frequency.  Musculoskeletal: Positive for back pain. Negative for neck pain and neck stiffness.  Skin: Negative for rash and wound.  Neurological: Negative for dizziness, light-headedness, numbness and headaches.  Psychiatric/Behavioral: Negative for agitation.  All other systems reviewed and are negative.    Physical Exam Updated Vital Signs BP (!) 158/98   Pulse 96   Resp (!) 22   Ht 5\' 5"  (1.651 m)   Wt 95.3 kg   SpO2 99%   BMI 34.95 kg/m   Physical Exam Vitals signs and nursing note reviewed.  Constitutional:      General: She is not in acute distress.    Appearance: She is  well-developed. She is not ill-appearing, toxic-appearing or diaphoretic.  HENT:     Head: Normocephalic and atraumatic.     Nose: No congestion or rhinorrhea.     Mouth/Throat:     Mouth: Mucous membranes are moist.     Pharynx: No oropharyngeal exudate or posterior oropharyngeal erythema.  Eyes:     Extraocular Movements: Extraocular movements intact.     Conjunctiva/sclera: Conjunctivae normal.     Pupils: Pupils are equal, round, and reactive to light.  Neck:     Musculoskeletal: Normal range of motion and neck supple. No muscular tenderness.  Cardiovascular:     Rate and Rhythm: Normal rate and regular rhythm.     Pulses: Normal pulses.     Heart sounds: No murmur.  Pulmonary:     Effort: Pulmonary effort is normal. No respiratory distress.     Breath sounds: Normal breath sounds. No decreased breath sounds, wheezing, rhonchi or rales.  Abdominal:     Palpations: Abdomen is soft.     Tenderness: There is no abdominal tenderness. There is no right CVA tenderness, left CVA tenderness or guarding.  Musculoskeletal:        General: Tenderness present.     Thoracic back: She exhibits tenderness and pain.       Back:     Right lower leg: She exhibits no tenderness. Edema present.     Left lower leg: She exhibits no tenderness. Edema present.  Skin:    General: Skin is warm and dry.     Capillary Refill: Capillary refill takes less than 2 seconds.  Neurological:     General: No focal deficit present.     Mental Status: She is alert and oriented to person, place, and time.     Cranial Nerves: No cranial nerve deficit.     Motor: No weakness.  Psychiatric:        Mood and Affect: Mood normal.      ED Treatments / Results  Labs (all labs ordered are listed,  but only abnormal results are displayed) Labs Reviewed  BASIC METABOLIC PANEL - Abnormal; Notable for the following components:      Result Value   Creatinine, Ser 1.10 (*)    GFR calc non Af Amer 46 (*)    GFR calc  Af Amer 54 (*)    All other components within normal limits  CBC - Abnormal; Notable for the following components:   WBC 11.7 (*)    All other components within normal limits  URINALYSIS, ROUTINE W REFLEX MICROSCOPIC - Abnormal; Notable for the following components:   Specific Gravity, Urine <1.005 (*)    All other components within normal limits  URINE CULTURE  TROPONIN I  HEPATIC FUNCTION PANEL  PROTIME-INR  LIPASE, BLOOD    EKG EKG Interpretation  Date/Time:  Tuesday March 10 2018 16:10:96 EST Ventricular Rate:  98 PR Interval:    QRS Duration: 102 QT Interval:  341 QTC Calculation: 436 R Axis:   -11 Text Interpretation:  Sinus rhythm Consider left atrial enlargement Low voltage, precordial leads Probable anteroseptal infarct, old When compared to prior, no significnat changes seen.  No STEMI Confirmed by Antony Blackbird [phone]) on 03/10/2018 8:11:21 AM   Radiology Ct Angio Chest/abd/pel For Dissection W And/or Wo Contrast  Result Date: 03/10/2018 CLINICAL DATA:  85 year old with acute onset of chest pain radiating through to the back and generalized abdominal pain that awoke the patient from sleep this morning. Patient also complains of a mild cough. EXAM: CT ANGIOGRAPHY CHEST, ABDOMEN AND PELVIS TECHNIQUE: Initially, multidetector CT imaging through the chest was performed prior to IV contrast administration. Subsequently, multidetector CT imaging through the chest, abdomen and pelvis was performed using the standard aortic dissection protocol during bolus administration of intravenous contrast. Multiplanar reconstructed images and MIPs were obtained and reviewed to evaluate the vascular anatomy. CONTRAST:  192mL ISOVUE-370 IOPAMIDOL INJECTION 76% IV. COMPARISON:  No prior chest CT. Chest x-rays 09/06/2016 and earlier. CT abdomen and pelvis 09/06/2016. FINDINGS: CTA CHEST FINDINGS Cardiovascular: No evidence of mural hematoma on the unenhanced images of the chest. No evidence of  thoracic aortic dissection on the post contrast images. No evidence of thoracic aortic aneurysm. Moderate thoracic aortic atherosclerosis. Central pulmonary arteries patent. Normal heart size. Prominent epicardial fat. Mild three-vessel coronary atherosclerosis. No pericardial effusion. Mediastinum/Nodes: Cystic mass with associated enhancing soft tissue elements in the ANTERIOR mediastinum to the RIGHT of midline measuring approximately 6.2 x 6.4 x 6.5 cm. No other mediastinal masses. Numerous normal sized lymph nodes throughout the mediastinum and in both hila. No pathologic lymphadenopathy. Normal appearing esophagus. Heterogeneously enhancing approximate 1.8 cm nodule involving the LOWER pole of the RIGHT lobe of the thyroid gland. Lungs/Pleura: BILATERAL lower lobe bronchiectasis, LEFT greater than RIGHT with associated mild scarring. Lungs otherwise clear. No pulmonary parenchymal nodules or masses. No evidence of interstitial lung disease. Central airways patent without significant bronchial wall thickening. No pleural effusions. Musculoskeletal: Degenerative disc disease and spondylosis throughout the thoracic spine. Osseous demineralization. No acute findings. Review of the MIP images confirms the above findings. CTA ABDOMEN AND PELVIS FINDINGS VASCULAR Aorta: No evidence of dissection or aneurysm. Severe atherosclerosis. Celiac: Patent. Atherosclerosis at its origin without evidence of significant stenosis. SMA: Patent. Atherosclerosis at its origin without evidence of significant stenosis. Renals: Single renal arteries bilaterally which are widely patent. IMA: Patent. Atherosclerosis at its origin without evidence of significant stenosis. Inflow: Moderate BILATERAL iliofemoral atherosclerosis without evidence of significant stenosis. Veins: Not evaluated. Review of the MIP images confirms the  above findings. NON-VASCULAR Hepatobiliary: Mild diffuse hepatic steatosis without focal hepatic parenchymal  abnormality. Sludge and small gallstones in the gallbladder. No pericholecystic inflammation. No biliary ductal dilation. Pancreas: Severely atrophic head and body. No mass or peripancreatic inflammation. Spleen: Normal appearance for the early arterial phase of enhancement which accounts for the heterogeneous enhancement. Adrenals/Urinary Tract: Approximate 1.6 x 2.4 cm nodule arising from the LATERAL limb of the LEFT adrenal gland which contains fat, not significantly changed in size or appearance since the prior CT abdomen and pelvis 09/06/2016. Normal appearing RIGHT adrenal gland. BILATERAL extrarenal pelves. Benign cortical cysts involving the LEFT kidney as noted on the prior CT. No significant parenchymal abnormalities involving either kidney. No hydronephrosis. No visible urinary tract calculi. Normal appearing urinary bladder. Stomach/Bowel: Small hiatal hernia containing fat. Stomach decompressed and normal in appearance. Normal-appearing small bowel. Distal descending and sigmoid colon diverticulosis without evidence of acute diverticulitis. Remainder of the colon normal in appearance. Opaque ingested material in the cecum. Normal-appearing short decompressed appendix in the RIGHT UPPER pelvis. Lymphatic: No pathologic lymphadenopathy. Reproductive: Surgically absent or markedly atrophic uterus. No adnexal masses. Other: RIGHT POSTERIOR diaphragmatic hernia containing fat. Musculoskeletal: Osseous demineralization. Degenerative disc disease at L2-3, L4-5 and L5-S1. Facet degenerative changes at L3-4, L4-5 and L5-S1. Slight degenerative grade 1 spondylolisthesis of L4 on L5. No acute findings. Review of the MIP images confirms the above findings. IMPRESSION: 1. No evidence of thoracic or abdominal aortic aneurysm or dissection. 2. No acute cardiopulmonary disease. 3. Cystic mass with associated enhancing soft tissue elements in the anterior mediastinum to the right of midline. Cystic teratoma and thymic  cyst/thymoma are the leading differential diagnostic considerations. 4. Approximate 1.8 cm nodule involving the lower pole of the right lobe of the thyroid gland. Thyroid ultrasound is recommended in further evaluation. This follows ACR consensus guidelines: Managing Incidental Thyroid Nodules Detected on Imaging: White Paper of the ACR Incidental Thyroid Findings Committee. J Am Coll Radiol 2015; 12:143-150. 5. No acute abnormalities involving the abdomen or pelvis. 6. Descending and sigmoid colon diverticulosis without evidence of acute diverticulitis. 7. Small hiatal hernia containing fat. RIGHT POSTERIOR diaphragmatic hernia containing fat. 8. Mild diffuse hepatic steatosis without focal hepatic parenchymal abnormality. 9. Cholelithiasis without CT evidence of acute cholecystitis. 10.  Aortic Atherosclerosis (ICD10-170.0) Electronically Signed   By: Evangeline Dakin M.D.   On: 03/10/2018 10:09    Procedures Procedures (including critical care time)  Medications Ordered in ED Medications  iopamidol (ISOVUE-370) 76 % injection 100 mL (100 mLs Intravenous Contrast Given 03/10/18 0905)     Initial Impression / Assessment and Plan / ED Course  I have reviewed the triage vital signs and the nursing notes.  Pertinent labs & imaging results that were available during my care of the patient were reviewed by me and considered in my medical decision making (see chart for details).        Kristen Manning is a 85 y.o. female with a past medical history significant for hypertension, hyperlipidemia, chronic headaches, hypothyroidism, GERD, and prior knee surgeries who presents with chest abdominal back pain.  Patient reports that at midnight she woke up with severe pain in her central lower chest and upper abdomen radiating straight through to her back.  She reports that pain was initially 6 out of 10 but has improved to 3 out of 10 currently.  She denies significant shortness breath or palpitations but she  reports the pain also rated into her left shoulder and some numbness in  her left arm.  She denies any vomiting or diaphoresis but does report some nausea.  She has never had this type of pain before.  She is had reflux before but thinks this feels slightly different.  She does reports a cough for the last few days but denies significant fevers or chills.  On exam, patient's abdomen and chest had very minimal tenderness.  Also minimal tenderness in her mid upper back.  Patient normal sensation and strength in extremities.  Normal grip strength.  Normal pulses in upper and lower extremities.  Edema in legs which patient reports is unchanged from her baseline.  No CVA tenderness.  Exam otherwise unremarkable.  Patient had symmetric radial pulses.  Based on patient's report of severe pain in her chest and upper abdomen straight through to her back that began suddenly, patient will have imaging to rule out dissection.  With her cough, will make sure she does not have pneumonia.  Will have screen laboratory testing.  Anticipate reassessment after work-up.  11:21 AM CT scan was reviewed by me showing evidence of an abnormality in the mediastinum.  I called radiology who looked and felt this was likely mediastinal mass and not an aneurysm.  Cardiothoracic surgery was called and they feel this is also a mediastinal mass and more of a cyst.  The CT surgery team evaluated the pictures and wants to see her in clinic this afternoon.  They do not feel she needs admission and they want to see her to discuss further management and likely surgery.  Patient was reassessed and had improvement in symptoms.  She had no chest pain or shortness of breath and was resting comfortably.  Patient agrees with discharge with strict return precautions and follow-up with the CT surgeon this afternoon.  Of note, patient reports her son had surgery last month removing a mass in his chest that was related to a thyroid mass.  CT also  revealed evidence of thyroid mass on this patient.  Patient will follow-up with the CT surgeon and PCP for further management.  Patient with questions or concerns and was discharged in good condition.   Final Clinical Impressions(s) / ED Diagnoses   Final diagnoses:  Mediastinal mass  Precordial pain  Chest pain, unspecified type    ED Discharge Orders    None     Clinical Impression: 1. Mediastinal mass   2. Precordial pain   3. Chest pain, unspecified type     Disposition: Discharge  Condition: Good  I have discussed the results, Dx and Tx plan with the pt(& family if present). He/she/they expressed understanding and agree(s) with the plan. Discharge instructions discussed at great length. Strict return precautions discussed and pt &/or family have verbalized understanding of the instructions. No further questions at time of discharge.    New Prescriptions   No medications on file    Follow Up: Ivin Poot, MD [address] [place] [id number] [phone]  Go today   Midge Minium, MD [address] [place]  [id number] [phone]        Tegeler, Gwenyth Allegra, MD 03/10/18 [phone]

## 2018-03-10 NOTE — Progress Notes (Signed)
PCP is Midge Minium, MD Referring Provider is Tegeler, Gwenyth Allegra,*  Chief Complaint  Patient presents with  . 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  Patient has had symptoms at rest and on exertion.  The scan was performed today at urgent care which shows a smooth round cystic structure on the right side of the ascending aorta without compression of the SVC.  There are no abnormal mediastinal nodes, no pleural effusion, no pulmonary nodule abnormalities.  Patient is had no previous chest CT scans with which to compare.  Previous chest x-rays have shown fairly normal mediastinal silhouette.  The patient denies previous thoracic trauma.  She states she had a probable viral pneumonia in 2018 treated as an outpatient.  She has had bilateral knee replacement surgery without cardiac or pulmonary complications.  She walks at home without a walker.  Patient had an echocardiogram 2 years ago showing LVH, normal systolic function, no valvular disease.  She has never had A. fib.  She has chronic bilateral lower extremity swelling.  Past Medical History:  Diagnosis Date  . Arthritis   . Bacterial infection    in lungs in Jan 2015  . Bilateral lower extremity edema   . Cancer ([place])    skin cancer, back, legs melonama, sees Dr Lisabeth Pick, derm  . Chronic back pain   . GERD (gastroesophageal reflux disease)    takes Omeprazole daily  . Headache(784.0)   . History of migraine    last one about 15 yrs ago  . Hyperlipidemia    takes Zetia daily  . Hypertension    takes HCTZ daily  . Joint pain   . Joint swelling   .  Myocardial infarction [facility])    pt states EKG always shows infarct but no change from previous ekg;never knew anything about it  . Nocturia   . Osteoarthritis   . Pneumonia    hx of-many yrs ago  . Pneumonia   . PONV (postoperative nausea and vomiting)   . Shortness of breath    with exertion  . Urinary frequency   . Urinary incontinence   . Urinary urgency     Past Surgical History:  Procedure Laterality Date  . bladder tack  1998  . BLEPHAROPLASTY Bilateral   . JOINT REPLACEMENT Left 10/02/2009  . JOINT REPLACEMENT Right 02-22-13   Knee  . KNEE SURGERY Bilateral 2009 and 2011  . OOPHORECTOMY  1998   only one  . TONSILLECTOMY  as a child ? date  . TOTAL KNEE ARTHROPLASTY Right 02/22/2013   DR Ronnie Derby  . TOTAL KNEE ARTHROPLASTY Right 02/22/2013   Procedure: TOTAL KNEE ARTHROPLASTY;  Surgeon: Vickey Huger, MD;  Location: [facility];  Service: Orthopedics;  Laterality: Right;    Family History  Problem Relation Age of Onset  . Kidney disease Mother   . Heart disease Mother   . Hypertension Mother   . Varicose Veins Mother   . Kidney failure Mother   . Alcohol abuse Father   . Stroke Father        several   . Hypertension Father   . Varicose Veins Father   . Alcohol abuse Brother   .  Hyperlipidemia Brother   . Hypertension Brother   . Early death Brother   . Varicose Veins Brother   . Heart disease Brother   . Hypertension Brother   . Arthritis Brother        Gout  . Varicose Veins Son   . Deep vein thrombosis Son   . Breast cancer Maternal Aunt     Social History Social History   Tobacco Use  . Smoking status: Never Smoker  . Smokeless tobacco: Never Used  Substance Use Topics  . Alcohol use: No  . Drug use: No    Current Outpatient Medications  Medication Sig Dispense Refill  . acetaminophen (TYLENOL) 500 MG tablet Take 1 tablet (500 mg total) by mouth every 8 (eight) hours as needed for moderate pain. 90 tablet 1  . allopurinol (ZYLOPRIM) 300 MG tablet Take  300 mg by mouth daily.  0  . azelastine (ASTELIN) 0.1 % nasal spray Place 2 sprays into both nostrils 2 (two) times daily. Use in each nostril as directed 30 mL 0  . Calcium 600-200 MG-UNIT tablet Take 1 tablet by mouth daily.    [name] co-enzyme Q-10 30 MG capsule Take 30 mg by mouth daily.    [name] ezetimibe (ZETIA) 10 MG tablet TAKE 1 TABLET BY MOUTH ONCE DAILY 90 tablet 0  . fenofibrate 160 MG tablet TAKE 1 TABLET BY MOUTH ONCE DAILY 90 tablet 3  . furosemide (LASIX) 40 MG tablet Take 1 tablet (40 mg total) by mouth daily. 90 tablet 3  . levothyroxine (SYNTHROID, LEVOTHROID) 50 MCG tablet TAKE 1 TABLET BY MOUTH ONCE DAILY 210 tablet 0  . loratadine (CLARITIN) 10 MG tablet Take 1 tablet (10 mg total) by mouth daily.    . NON FORMULARY Take 1 tablet by mouth daily.    [name] omeprazole (PRILOSEC) 20 MG capsule Take 20 mg by mouth daily.    [name] triamcinolone (NASACORT) 55 MCG/ACT AERO nasal inhaler Place 2 sprays into the nose daily. 1 Inhaler 12  . triamcinolone cream (KENALOG) 0.1 % Apply 1 application topically 2 (two) times daily. For two weeks as needed 45 g 0   No current facility-administered medications for this visit.     Allergies  Allergen Reactions  . Aspirin Other (See Comments)    Noted caused 2 holes in retina, not sure   . Codeine Hives  . Statins     Intolerance to statins  . Tizanidine Hcl Other (See Comments)    Confusion    Review of Systems  Weight stable No fever No headache dizziness or syncope No difficulty swallowing No dental complaints Positive for chest pressure fullness Negative for abdominal pain Negative for dysuria hematuria Positive for chronic ankle edema Negative for stroke  BP (!) 185/95 (BP Location: Right Arm, Patient Position: Sitting, Cuff Size: Large)   Pulse (!) 107   Resp 20   Ht 5\' 3"  (1.6 m)   Wt 214 lb 9.6 oz (97.3 kg)   SpO2 96% Comment: RA  BMI 38.01 kg/m  Physical Exam      Exam    General- alert and comfortable    Neck- no JVD,  no cervical adenopathy palpable, no carotid bruit   Lungs- clear without rales, wheezes   Cor- regular rate and rhythm, no murmur , gallop   Abdomen- soft, non-tender   Extremities - warm, non-tender, 3+ bilateral ankle edema   Neuro- oriented, appropriate, no focal weakness   Diagnostic Tests: CT scan images shows a substernal  round 6 cm possible thymic cyst versus teratoma without invasive characteristics  Impression: Mediastinal mass.  Appears to be cystic but patient should undergo PET scan to assess malignant potential.  This mass shows malignant characteristics at 85 she would not be a surgical candidate.  If it is a progressively growing and symptomatic benign thymic cyst then the patient would be a potential candidate for partial sternotomy and resection.  Plan: Patient will get a PET scan followed by Myoview and PFTs to assess malignant potential and surgical candidacy.   Len Childs, MD [facility] [phone]

## 2018-03-10 NOTE — Discharge Instructions (Signed)
Your work-up today revealed evidence of a mediastinal/chest mass that is likely pushing on other tissues causing your symptoms.  The cardiothoracic surgeon, Dr. Prescott Gum looked at your images and wants to see you in clinic today to discuss further management.  Please go to his appointment this afternoon in his office.  If any symptoms change or worsen acutely, please return to the nearest emergency department.  Please rest and stay hydrated.

## 2018-03-11 LAB — URINE CULTURE: Culture: NO GROWTH

## 2018-03-12 ENCOUNTER — Ambulatory Visit: Payer: Medicare Other | Admitting: Physical Therapy

## 2018-03-18 ENCOUNTER — Ambulatory Visit (HOSPITAL_COMMUNITY): Payer: Medicare Other

## 2018-03-20 ENCOUNTER — Ambulatory Visit (HOSPITAL_COMMUNITY)
Admission: RE | Admit: 2018-03-20 | Discharge: 2018-03-20 | Disposition: A | Discharge status: Home / Self Care | Payer: Medicare Other | Source: Ambulatory Visit | Attending: Cardiothoracic Surgery | Admitting: Cardiothoracic Surgery

## 2018-03-20 ENCOUNTER — Other Ambulatory Visit: Payer: Self-pay

## 2018-03-20 ENCOUNTER — Encounter (HOSPITAL_COMMUNITY): Payer: Medicare Other

## 2018-03-20 DIAGNOSIS — J9859 Other diseases of mediastinum, not elsewhere classified: Secondary | ICD-10-CM | POA: Diagnosis not present

## 2018-03-20 LAB — PULMONARY FUNCTION TEST
DL/VA % pred: 89 %
DL/VA: 3.65 ml/min/mmHg/L
DLCO cor % pred: 78 %
DLCO cor: 14.15 ml/min/mmHg
DLCO unc % pred: 79 %
DLCO unc: 14.37 ml/min/mmHg
FEF 25-75 Post: 1.29 L/sec
FEF 25-75 Pre: 1.23 L/sec
FEF2575-%Change-Post: 4 %
FEF2575-%Pred-Post: 107 %
FEF2575-%Pred-Pre: 102 %
FEV1-%Change-Post: -1 %
FEV1-%Pred-Post: 106 %
FEV1-%Pred-Pre: 108 %
FEV1-Post: 1.86 L
FEV1-Pre: 1.88 L
FEV1FVC-%Change-Post: 0 %
FEV1FVC-%Pred-Pre: 97 %
FEV6-%Change-Post: -2 %
FEV6-%Pred-Post: 113 %
FEV6-%Pred-Pre: 116 %
FEV6-Post: 2.51 L
FEV6-Pre: 2.58 L
FEV6FVC-%Change-Post: -1 %
FEV6FVC-%Pred-Post: 102 %
FEV6FVC-%Pred-Pre: 104 %
FVC-%Change-Post: -1 %
FVC-%Pred-Post: 109 %
FVC-%Pred-Pre: 111 %
FVC-Post: 2.59 L
FVC-Pre: 2.63 L
Post FEV1/FVC ratio: 72 %
Post FEV6/FVC ratio: 97 %
Pre FEV1/FVC ratio: 72 %
Pre FEV6/FVC Ratio: 98 %
RV % pred: 124 %
RV: 2.99 L
TLC % pred: 110 %
TLC: 5.42 L

## 2018-03-20 MED ORDER — ALBUTEROL SULFATE (2.5 MG/3ML) 0.083% IN NEBU
2.5000 mg | INHALATION_SOLUTION | Freq: Once | RESPIRATORY_TRACT | Status: AC
  Administered 2018-03-20: 2.5 mg via RESPIRATORY_TRACT

## 2018-03-24 ENCOUNTER — Ambulatory Visit (HOSPITAL_COMMUNITY)
Admission: RE | Admit: 2018-03-24 | Discharge: 2018-03-24 | Disposition: A | Discharge status: Home / Self Care | Payer: Medicare Other | Source: Ambulatory Visit | Attending: Cardiothoracic Surgery | Admitting: Cardiothoracic Surgery

## 2018-03-24 ENCOUNTER — Other Ambulatory Visit: Payer: Self-pay

## 2018-03-24 DIAGNOSIS — Z79899 Other long term (current) drug therapy: Secondary | ICD-10-CM | POA: Insufficient documentation

## 2018-03-24 DIAGNOSIS — J9859 Other diseases of mediastinum, not elsewhere classified: Secondary | ICD-10-CM | POA: Diagnosis not present

## 2018-03-24 LAB — GLUCOSE, CAPILLARY: Glucose-Capillary: 93 mg/dL (ref 70–99)

## 2018-03-24 MED ORDER — FLUDEOXYGLUCOSE F - 18 (FDG) INJECTION
10.4800 | Freq: Once | INTRAVENOUS | Status: AC | PRN
  Administered 2018-03-24: 10.48 via INTRAVENOUS

## 2018-03-25 ENCOUNTER — Encounter: Payer: Self-pay | Admitting: Cardiovascular Disease

## 2018-03-25 ENCOUNTER — Encounter: Payer: Self-pay | Admitting: *Deleted

## 2018-03-25 ENCOUNTER — Ambulatory Visit (INDEPENDENT_AMBULATORY_CARE_PROVIDER_SITE_OTHER): Payer: Medicare Other | Admitting: Cardiovascular Disease

## 2018-03-25 DIAGNOSIS — R079 Chest pain, unspecified: Secondary | ICD-10-CM | POA: Diagnosis not present

## 2018-03-25 DIAGNOSIS — E782 Mixed hyperlipidemia: Secondary | ICD-10-CM

## 2018-03-25 NOTE — Patient Instructions (Signed)
Medication Instructions:  Your physician recommends that you continue on your current medications as directed. Please refer to the Current Medication list given to you today.  If you need a refill on your cardiac medications before your next appointment, please call your pharmacy.   Lab work: NONE If you have labs (blood work) drawn today and your tests are completely normal, you will receive your results only by: [name] MyChart Message (if you have MyChart) OR . A paper copy in the mail If you have any lab test that is abnormal or we need to change your treatment, we will call you to review the results.  Testing/Procedures: Your physician has requested that you have a lexiscan myoview. For further information please visit [email or web address]. Please follow instruction sheet, as given.   Follow-Up: At [facility], you and your health needs are our priority.  As part of our continuing mission to provide you with exceptional heart care, we have created designated Provider Care Teams.  These Care Teams include your primary Cardiologist (physician) and Advanced Practice Providers (APPs -  Physician Assistants and Nurse Practitioners) who all work together to provide you with the care you need, when you need it. . You will need a follow up appointment in 12 months.  Please call our office 2 months in advance to schedule this appointment.  You may see Dr. Gwenlyn Found or one of the following Advanced Practice Providers on your designated Care Team:   . Kerin Ransom, [place] . Almyra Deforest, PA-C . Fabian Sharp, PA-C . Jory Sims, DNP . Rosaria Ferries, PA-C . Roby Lofts, PA-C . Sande Rives, PA-C

## 2018-03-25 NOTE — Progress Notes (Signed)
03/25/2018 Kristen Manning   10/31/1932  [id number]  Primary Physician Midge Minium, MD Primary Cardiologist: Lorretta Harp MD Garret Reddish, [place], [place]  HPI:  Kristen Manning is a 85 y.o.  moderate to severely overweight widowed Caucasian female mother of 8, grandmother of her grandchildren referred by Dr. Birdie Riddle for cardiovascular evaluation. I last saw her in the office  08/05/2017.Her brother [name] is also a patient here. Has a history of treated hypertension and hyperlipidemia. She has had bilateral total knee replacements and has gout She received pneumonia and flu vaccine in the fall of last year. Should she suddenly developed pneumonia and the flow. She has complained of increasing lower extremity edema and dyspnea on exertion with occasional chest pain. I doubled her Lasix to twice a day which resulted in marked improvement in her edema.Since I saw her a year ago she's remained clinically stable. She still has chronic bilateral lower extremity edema on oral diuretics. A venous reflux study apparently was positive for venous reflux.  Since I saw her in July of last year she was seen in the emergency room on 03/10/2018 with an episode of chest pain.  Her EKG showed no acute changes.  She ruled out for myocardial infarction.  Chest CT did show a mediastinal mass and she was referred to Dr. Darcey Nora for further evaluation.   Current Meds  Medication Sig  . acetaminophen (TYLENOL) 500 MG tablet Take 1 tablet (500 mg total) by mouth every 8 (eight) hours as needed for moderate pain.  [name] allopurinol (ZYLOPRIM) 300 MG tablet Take 300 mg by mouth daily.  [name] azelastine (ASTELIN) 0.1 % nasal spray Place 2 sprays into both nostrils 2 (two) times daily. Use in each nostril as directed  . Calcium 600-200 MG-UNIT tablet Take 1 tablet by mouth daily.  [name] co-enzyme Q-10 30 MG capsule Take 30 mg by mouth daily.  [name] ezetimibe (ZETIA) 10 MG tablet TAKE 1 TABLET BY MOUTH ONCE DAILY  .  fenofibrate 160 MG tablet TAKE 1 TABLET BY MOUTH ONCE DAILY  . furosemide (LASIX) 40 MG tablet Take 1 tablet (40 mg total) by mouth daily.  [name] levothyroxine (SYNTHROID, LEVOTHROID) 50 MCG tablet TAKE 1 TABLET BY MOUTH ONCE DAILY  . loratadine (CLARITIN) 10 MG tablet Take 1 tablet (10 mg total) by mouth daily.  . NON FORMULARY Take 1 tablet by mouth daily.  [name] omeprazole (PRILOSEC) 20 MG capsule Take 20 mg by mouth daily.  [name] triamcinolone (NASACORT) 55 MCG/ACT AERO nasal inhaler Place 2 sprays into the nose daily.  [name] triamcinolone cream (KENALOG) 0.1 % Apply 1 application topically 2 (two) times daily. For two weeks as needed     Allergies  Allergen Reactions  . Aspirin Other (See Comments)    Noted caused 2 holes in retina, not sure   . Codeine Hives  . Statins     Intolerance to statins  . Tizanidine Hcl Other (See Comments)    Confusion    Social History   Socioeconomic History  . Marital status: Widowed    Spouse name: Not on file  . Number of children: 4  . Years of education: Not on file  . Highest education level: Not on file  Occupational History  . Not on file  Social Needs  . Financial resource strain: Not on file  . Food insecurity:    Worry: Not on file    Inability: Not on file  . Transportation needs:  Medical: Not on file    Non-medical: Not on file  Tobacco Use  . Smoking status: Never Smoker  . Smokeless tobacco: Never Used  Substance and Sexual Activity  . Alcohol use: No  . Drug use: No  . Sexual activity: Not on file  Lifestyle  . Physical activity:    Days per week: Not on file    Minutes per session: Not on file  . Stress: Not on file  Relationships  . Social connections:    Talks on phone: Not on file    Gets together: Not on file    Attends religious service: Not on file    Active member of club or organization: Not on file    Attends meetings of clubs or organizations: Not on file    Relationship status: Not on file  . Intimate  partner violence:    Fear of current or ex partner: Not on file    Emotionally abused: Not on file    Physically abused: Not on file    Forced sexual activity: Not on file  Other Topics Concern  . Not on file  Social History Narrative  . Not on file     Review of Systems: General: negative for chills, fever, night sweats or weight changes.  Cardiovascular: negative for chest pain, dyspnea on exertion, edema, orthopnea, palpitations, paroxysmal nocturnal dyspnea or shortness of breath Dermatological: negative for rash Respiratory: negative for cough or wheezing Urologic: negative for hematuria Abdominal: negative for nausea, vomiting, diarrhea, bright red blood per rectum, melena, or hematemesis Neurologic: negative for visual changes, syncope, or dizziness All other systems reviewed and are otherwise negative except as noted above.    Blood pressure 140/80, pulse 85, height 5\' 3"  (1.6 m), weight 210 lb 12.8 oz (95.6 kg).  General appearance: alert and no distress Neck: no adenopathy, no carotid bruit, no JVD, supple, symmetrical, trachea midline and thyroid not enlarged, symmetric, no tenderness/mass/nodules Lungs: clear to auscultation bilaterally Heart: regular rate and rhythm, S1, S2 normal, no murmur, click, rub or gallop Extremities: extremities normal, atraumatic, no cyanosis or edema Pulses: 2+ and symmetric Skin: Skin color, texture, turgor normal. No rashes or lesions Neurologic: Alert and oriented X 3, normal strength and tone. Normal symmetric reflexes. Normal coordination and gait  EKG sinus rhythm 85 with left axis deviation and poor R wave progression.  I personally reviewed this EKG  ASSESSMENT AND PLAN:   HTN (hypertension) History of essential hypertension with blood pressure measured today 140/80.  She is on no antihypertensive medications.  Hyperlipidemia History of hyperlipidemia on Zetia and fenofibrate intolerant to statin therapy with lipid profile  performed 02/11/2018 revealing cholesterol 187, LDL 111 and HDL 45.  Atypical chest pain Ms. Flinchbaugh was referred back because of atypical chest pain.  She had a negative Myoview stress test performed 07/20/2015.  She is recently seen in the ER 03/10/2018 because of atypical chest pain.  Her EKG showed no acute changes.  Enzymes are negative.  Chest CT did show a mediastinal mass and apparently she was referred to Dr. Darcey Nora for further evaluation.       Lorretta Harp MD [place], [facility] 03/25/2018 4:03 PM

## 2018-03-25 NOTE — Assessment & Plan Note (Signed)
History of essential hypertension with blood pressure measured today 140/80.  She is on no antihypertensive medications.

## 2018-03-25 NOTE — Assessment & Plan Note (Signed)
History of hyperlipidemia on Zetia and fenofibrate intolerant to statin therapy with lipid profile performed 02/11/2018 revealing cholesterol 187, LDL 111 and HDL 45.

## 2018-03-25 NOTE — Assessment & Plan Note (Addendum)
Kristen Manning was referred back because of atypical chest pain.  She had a negative Myoview stress test performed 07/20/2015.  She is recently seen in the ER 03/10/2018 because of atypical chest pain.  Her EKG showed no acute changes.  Enzymes are negative.  Chest CT did show a mediastinal mass and apparently she was referred to Dr. Darcey Nora for further evaluation.

## 2018-03-30 DIAGNOSIS — Z85820 Personal history of malignant melanoma of skin: Secondary | ICD-10-CM | POA: Diagnosis not present

## 2018-03-30 DIAGNOSIS — Z85828 Personal history of other malignant neoplasm of skin: Secondary | ICD-10-CM | POA: Diagnosis not present

## 2018-03-30 DIAGNOSIS — I872 Venous insufficiency (chronic) (peripheral): Secondary | ICD-10-CM | POA: Diagnosis not present

## 2018-03-30 DIAGNOSIS — I8311 Varicose veins of right lower extremity with inflammation: Secondary | ICD-10-CM | POA: Diagnosis not present

## 2018-03-30 DIAGNOSIS — I8312 Varicose veins of left lower extremity with inflammation: Secondary | ICD-10-CM | POA: Diagnosis not present

## 2018-03-30 DIAGNOSIS — C4441 Basal cell carcinoma of skin of scalp and neck: Secondary | ICD-10-CM | POA: Diagnosis not present

## 2018-03-30 DIAGNOSIS — L570 Actinic keratosis: Secondary | ICD-10-CM | POA: Diagnosis not present

## 2018-03-31 ENCOUNTER — Other Ambulatory Visit: Payer: Self-pay

## 2018-04-01 ENCOUNTER — Encounter: Payer: Medicare Other | Admitting: Cardiothoracic Surgery

## 2018-04-01 ENCOUNTER — Telehealth: Payer: Self-pay

## 2018-04-01 NOTE — Telephone Encounter (Signed)
New message   Spoke with patient per staff messages from Dr. Radford Pax myocardial perfusion has been rescheduled to 5/21.

## 2018-04-01 NOTE — Telephone Encounter (Signed)
[address]       [place] [address]             [phone]     CARDIOTHORACIC SURGERY TELEPHONE VIRTUAL OFFICE NOTE  Referring Provider is No ref. provider found Primary Cardiologist is No primary care provider on file.     HPI:  I spoke with Kristen Manning via telephone on 04/01/2018 at 12:48 PM and verified that I was speaking with the correct person.  I was speaking to the patient from my office while looking at her medical record and she was speaking on the phone at her home.  Since her last visit she has had a PET scan, PFTs, and cardiology evaluation.  She has a large 6 cm cystic mass in the anterior mid mediastinum to the right of the ascending aorta.  She has had chest pain probably from this mass.  PET scan shows some mild metabolic activity of the wall of the cystic structure consistent with a slow-growing thymic cyst or possible teratoma.  It does not look like sub sternal goiter.  Her PFTs show adequate pulmonary reserve for sternotomy.  Her cardiologist has recommended a stress test which is scheduled in May--her last stress test in 2017 was negative for ischemia.  The patient is considering holding off on surgery and has requested a follow-up scan in approximately 6 months to see if the cystic mass shows signs of increase.  She will go ahead and proceed with the cardiac stress test which will be needed in case surgery is decided upon.  If she has more chest pain that interferes with her daily lifestyle then she will call us sooner.  Otherwise we will see the patient back in September with a follow-up CT scan of chest with IV contrast.   Current Outpatient Medications  Medication Sig Dispense Refill  . acetaminophen (TYLENOL) 500 MG tablet Take 1 tablet (500 mg total) by mouth every 8 (eight) hours as needed for moderate pain. 90 tablet 1  . allopurinol (ZYLOPRIM) 300 MG tablet Take 300 mg by mouth daily.  0  . azelastine (ASTELIN) 0.1 % nasal spray  Place 2 sprays into both nostrils 2 (two) times daily. Use in each nostril as directed 30 mL 0  . Calcium 600-200 MG-UNIT tablet Take 1 tablet by mouth daily.    [name] co-enzyme Q-10 30 MG capsule Take 30 mg by mouth daily.    [name] ezetimibe (ZETIA) 10 MG tablet TAKE 1 TABLET BY MOUTH ONCE DAILY 90 tablet 0  . fenofibrate 160 MG tablet TAKE 1 TABLET BY MOUTH ONCE DAILY 90 tablet 3  . furosemide (LASIX) 40 MG tablet Take 1 tablet (40 mg total) by mouth daily. 90 tablet 3  . levothyroxine (SYNTHROID, LEVOTHROID) 50 MCG tablet TAKE 1 TABLET BY MOUTH ONCE DAILY 210 tablet 0  . loratadine (CLARITIN) 10 MG tablet Take 1 tablet (10 mg total) by mouth daily.    . NON FORMULARY Take 1 tablet by mouth daily.    [name] omeprazole (PRILOSEC) 20 MG capsule Take 20 mg by mouth daily.    [name] triamcinolone (NASACORT) 55 MCG/ACT AERO nasal inhaler Place 2 sprays into the nose daily. 1 Inhaler 12  . triamcinolone cream (KENALOG) 0.1 % Apply 1 application topically 2 (two) times daily. For two weeks as needed 45 g 0   No current facility-administered medications for this visit.      Diagnostic Tests:  Results of PET scan, PFTs,  and cardiology evaluation discussed with patient   Impression:  Large 6 cm cystic mid anterior mediastinal mass with mild hypermetabolic activity consistent with a slowly growing tumor.  No evidence of metastatic disease.  The mass is in the mid mediastinum and require partial sternotomy.  Stress test is pending because of positive risk factors for heart disease and clinical history of diastolic heart failure.  Plan: Patient wishes to hold off on surgery if possible.  We will do a surveillance CT scan 6 months to see if there is any change in the cystic mass.  She understands she can call us if her symptoms worsen and she would want to proceed with surgery earlier     I discussed limitations of evaluation and management via telephone.  The patient was advised to call back or seek an in-person  evaluation if the patient's clinical condition changes in any significant manner.  I spent in excess of 9 minutes of non-face-to-face time during the conduct of this telephone virtual office consultation.  Level 1  (99441)             5-10 minutes Level 2  (99442)            11-20 minutes Level 3  (99443)            21-30 minutes   Dahlia Byes MD  04/01/2018 12:48 PM

## 2018-04-21 ENCOUNTER — Inpatient Hospital Stay (HOSPITAL_COMMUNITY): Admission: RE | Admit: 2018-04-21 | Payer: Medicare Other | Source: Ambulatory Visit

## 2018-04-23 ENCOUNTER — Other Ambulatory Visit: Payer: Self-pay | Admitting: Family Medicine

## 2018-04-28 ENCOUNTER — Telehealth: Payer: Self-pay | Admitting: Internal Medicine

## 2018-04-28 NOTE — Telephone Encounter (Signed)
Called patient  She said she has not had any more chest pain Will cancel myovue scheduled for May 1 given current restrictions/closures with COVID pandemic Will forward to Dr Gwenlyn Found for consideration as pt is asymptomatic

## 2018-05-04 ENCOUNTER — Other Ambulatory Visit (HOSPITAL_COMMUNITY): Payer: Self-pay | Admitting: Cardiovascular Disease

## 2018-05-08 ENCOUNTER — Encounter (HOSPITAL_COMMUNITY): Payer: Medicare Other

## 2018-05-11 ENCOUNTER — Ambulatory Visit: Payer: Medicare Other | Admitting: Neurology

## 2018-05-13 ENCOUNTER — Ambulatory Visit: Payer: Medicare Other | Admitting: Neurology

## 2018-05-28 ENCOUNTER — Telehealth: Payer: Self-pay

## 2018-05-28 NOTE — Telephone Encounter (Signed)
Forward to DR BERRY AND RN  UNABLE TO REACH FOR MYOVIEW

## 2018-05-28 NOTE — Telephone Encounter (Signed)
OK, try one more time.

## 2018-05-28 NOTE — Telephone Encounter (Signed)
New message     Just an FYI. We have made several attempts to contact this patient including sending a letter to schedule or reschedule their myocardial perfusion . We will be removing the patient from the WQ.   5.21.20 @ 9:36am lm on home / cell home vm Naryah Clenney  5.18.20 @ 9:10am lm on home vm Taeko Schaffer  5.14.20 @ 10:18am lm on home/ cell phone Doni Bacha  5.11.20 @ 11:27am lm on home vm Jillienne Egner  5.6.20 @ 3:10pm lm on vm to call Sharlynn Seckinger  Thank you

## 2018-07-09 ENCOUNTER — Telehealth (HOSPITAL_COMMUNITY): Payer: Self-pay

## 2018-07-09 NOTE — Telephone Encounter (Signed)
New message   Just an FYI. We have made several attempts to contact this patient including sending a letter to schedule or reschedule their MYOCARDIAL PERFUSION. We will be removing the patient from the WQ.    6.26.20 @ 2:44pm lm on home vm - Kristen Manning  5.21.20 @ 9:36am lm on home / cell home vm Kristen Manning  5.18.20 @ 9:10am lm on home vm Kristen Manning  5.14.20 @ 10:18am lm on home/ cell phone Kristen Manning  5.11.20 @ 11:27am lm on home vm Kristen Manning  5.6.20 @ 3:10pm lm on vm to call Kristen Manning

## 2018-07-10 NOTE — Telephone Encounter (Signed)
That's fine

## 2018-08-11 ENCOUNTER — Ambulatory Visit (INDEPENDENT_AMBULATORY_CARE_PROVIDER_SITE_OTHER): Payer: Medicare Other | Admitting: Family Medicine

## 2018-08-11 ENCOUNTER — Encounter: Payer: Self-pay | Admitting: Family Medicine

## 2018-08-11 ENCOUNTER — Other Ambulatory Visit: Payer: Self-pay

## 2018-08-11 VITALS — BP 136/81 | HR 83 | Temp 97.3°F | Resp 16 | Ht 63.0 in | Wt 212.5 lb

## 2018-08-11 DIAGNOSIS — J329 Chronic sinusitis, unspecified: Secondary | ICD-10-CM | POA: Diagnosis not present

## 2018-08-11 DIAGNOSIS — H269 Unspecified cataract: Secondary | ICD-10-CM | POA: Diagnosis not present

## 2018-08-11 DIAGNOSIS — B9689 Other specified bacterial agents as the cause of diseases classified elsewhere: Secondary | ICD-10-CM | POA: Diagnosis not present

## 2018-08-11 DIAGNOSIS — Z0001 Encounter for general adult medical examination with abnormal findings: Secondary | ICD-10-CM

## 2018-08-11 DIAGNOSIS — Z0000 Encounter for general adult medical examination without abnormal findings: Secondary | ICD-10-CM

## 2018-08-11 DIAGNOSIS — R441 Visual hallucinations: Secondary | ICD-10-CM

## 2018-08-11 LAB — CBC WITH DIFFERENTIAL/PLATELET
Basophils Absolute: 0.1 10*3/uL (ref 0.0–0.1)
Basophils Relative: 1 % (ref 0.0–3.0)
Eosinophils Absolute: 0.1 10*3/uL (ref 0.0–0.7)
Eosinophils Relative: 1.1 % (ref 0.0–5.0)
HCT: 40 % (ref 36.0–46.0)
Hemoglobin: 13.5 g/dL (ref 12.0–15.0)
Lymphocytes Relative: 20.9 % (ref 12.0–46.0)
Lymphs Abs: 1.7 10*3/uL (ref 0.7–4.0)
MCHC: 33.8 g/dL (ref 30.0–36.0)
MCV: 91.2 fl (ref 78.0–100.0)
Monocytes Absolute: 0.6 10*3/uL (ref 0.1–1.0)
Monocytes Relative: 7 % (ref 3.0–12.0)
Neutro Abs: 5.6 10*3/uL (ref 1.4–7.7)
Neutrophils Relative %: 70 % (ref 43.0–77.0)
Platelets: 294 10*3/uL (ref 150.0–400.0)
RBC: 4.38 Mil/uL (ref 3.87–5.11)
RDW: 13.9 % (ref 11.5–15.5)
WBC: 8 10*3/uL (ref 4.0–10.5)

## 2018-08-11 LAB — LIPID PANEL
Cholesterol: 171 mg/dL (ref 0–200)
HDL: 60.4 mg/dL (ref >39.00)
LDL Cholesterol: 91 mg/dL (ref 0–99)
NonHDL: 110.14
Total CHOL/HDL Ratio: 3
Triglycerides: 97 mg/dL (ref 0.0–149.0)
VLDL: 19.4 mg/dL (ref 0.0–40.0)

## 2018-08-11 LAB — HEPATIC FUNCTION PANEL
ALT: 14 U/L (ref 0–35)
AST: 19 U/L (ref 0–37)
Albumin: 4.2 g/dL (ref 3.5–5.2)
Alkaline Phosphatase: 90 U/L (ref 39–117)
Bilirubin, Direct: 0.1 mg/dL (ref 0.0–0.3)
Total Bilirubin: 0.5 mg/dL (ref 0.2–1.2)
Total Protein: 7.6 g/dL (ref 6.0–8.3)

## 2018-08-11 LAB — BASIC METABOLIC PANEL
BUN: 30 mg/dL — ABNORMAL HIGH (ref 6–23)
CO2: 27 mEq/L (ref 19–32)
Calcium: 10.3 mg/dL (ref 8.4–10.5)
Chloride: 101 mEq/L (ref 96–112)
Creatinine, Ser: 1.25 mg/dL — ABNORMAL HIGH (ref 0.40–1.20)
GFR: 40.85 mL/min — ABNORMAL LOW (ref >60.00)
Glucose, Bld: 93 mg/dL (ref 70–99)
Potassium: 4.7 mEq/L (ref 3.5–5.1)
Sodium: 137 mEq/L (ref 135–145)

## 2018-08-11 LAB — TSH: TSH: 3.89 u[IU]/mL (ref 0.35–4.50)

## 2018-08-11 MED ORDER — AMOXICILLIN 875 MG PO TABS: 875.0000 mg | ORAL_TABLET | Freq: Two times a day (BID) | ORAL | 0 refills | Status: DC

## 2018-08-11 NOTE — Assessment & Plan Note (Signed)
Pt's PE unchanged from previous w/ exception of TTP over sinuses.  UTD on immunizations.  Pt to call and schedule her mammo.  Check labs.  Anticipatory guidance provided.

## 2018-08-11 NOTE — Assessment & Plan Note (Signed)
Ongoing issue for pt.  She is limited in her ability to exercise due to knee replacements.  Stressed need for healthy diet.  Check labs to risk stratify.  Will follow.

## 2018-08-11 NOTE — Patient Instructions (Addendum)
Follow up in 6 months to recheck cholesterol and BP We'll notify you of your lab results and make any changes if needed Call and schedule your mammogram at your convenience START the Amoxicillin twice daily- take w/ food CONTINUE your allergy medication daily Call with any questions or concerns Stay Safe!!

## 2018-08-11 NOTE — Progress Notes (Signed)
   Subjective:    Patient ID: Kristen Manning, female    DOB: August 08, 1932, 85 y.o.   MRN: [id number]  HPI CPE- UTD on immunizations, due for repeat mammo.  No longer having colonoscopy.  mediastinal mass- saw Dr Prescott Gum.  Had a PET scan that was negative.  'I made a deal with God that if I leave it alone, it will leave me alone'.  She is not interested in doing any surgery at this time but would like a repeat scan to assess for interval growth/change.  Sinusitis- pt is taking her daily Claritin and using a nasal spray but reports she is having sinus pain/pressure over frontal and maxillary sinuses.  No fevers, chills, body aches.  + HA.   Review of Systems Patient reports no vision/ hearing changes, adenopathy,fever, weight change,  persistant/recurrent hoarseness , swallowing issues, chest pain, palpitations, edema, persistant/recurrent cough, hemoptysis, dyspnea (rest/exertional/paroxysmal nocturnal), gastrointestinal bleeding (melena, rectal bleeding), abdominal pain, significant heartburn, bowel changes, GU symptoms (dysuria, hematuria, incontinence), Gyn symptoms (abnormal  bleeding, pain),  syncope, focal weakness, memory loss, numbness & tingling, skin/hair/nail changes, abnormal bruising or bleeding, anxiety, or depression.   Pt reports she saw angels in her room the other night.  There were 4 of them, she reports she felt their robes.  Saw Jesus.  'i'm no longer afraid'.    Objective:   Physical Exam General Appearance:    Alert, cooperative, no distress, appears stated age, obese  Head:    Normocephalic, without obvious abnormality, atraumatic  Eyes:    PERRL, conjunctiva/corneas clear, EOM's intact, fundi    benign, both eyes  Ears:    Normal TM's and external ear canals, both ears  Nose:   + TTP over frontal and maxillary sinuses  Throat:   Neck:   Supple, symmetrical, trachea midline, no adenopathy;    Thyroid: no enlargement/tenderness/nodules  Back:     Symmetric, no  curvature, ROM normal, no CVA tenderness  Lungs:     Clear to auscultation bilaterally, respirations unlabored  Chest Wall:    No tenderness or deformity   Heart:    Regular rate and rhythm, S1 and S2 normal, no murmur, rub   or gallop  Breast Exam:    Deferred to mammo  Abdomen:     Soft, non-tender, bowel sounds active all four quadrants,    no masses, no organomegaly  Genitalia:    Deferred  Rectal:    Extremities:   Venous insufficiency bilaterally w/ swelling and skin changes w/o skin breakdown  Pulses:   Diminished pulses of LEs bilaterally  Skin:   Skin color, texture, turgor normal, no rashes or lesions  Lymph nodes:   Cervical, supraclavicular, and axillary nodes normal  Neurologic:   CNII-XII intact, normal strength, sensation and reflexes    throughout          Assessment & Plan:  Sinusitis- recurrent issue for pt.  Start Amox.  Reviewed supportive care and red flags that should prompt return.  Pt expressed understanding and is in agreement w/ plan.   Visual hallucinations- pt is very serious about seeing Angels and Jesus in her room.  Reports she touched their 'silky robes'.  She has not had any hallucinations prior to or since.  Will check labs to assess for metabolic abnormalities.  Encouraged pt to alert me to any other visions she may have.

## 2018-08-12 ENCOUNTER — Other Ambulatory Visit: Payer: Self-pay

## 2018-08-12 DIAGNOSIS — R7989 Other specified abnormal findings of blood chemistry: Secondary | ICD-10-CM

## 2018-08-20 ENCOUNTER — Other Ambulatory Visit: Payer: Self-pay | Admitting: Family Medicine

## 2018-08-20 DIAGNOSIS — Z1231 Encounter for screening mammogram for malignant neoplasm of breast: Secondary | ICD-10-CM

## 2018-08-27 ENCOUNTER — Other Ambulatory Visit: Payer: Self-pay | Admitting: Cardiothoracic Surgery

## 2018-08-27 DIAGNOSIS — J9859 Other diseases of mediastinum, not elsewhere classified: Secondary | ICD-10-CM

## 2018-08-27 NOTE — Progress Notes (Unsigned)
CT

## 2018-09-01 ENCOUNTER — Ambulatory Visit (INDEPENDENT_AMBULATORY_CARE_PROVIDER_SITE_OTHER): Payer: Medicare Other

## 2018-09-01 ENCOUNTER — Other Ambulatory Visit: Payer: Self-pay

## 2018-09-01 DIAGNOSIS — R7989 Other specified abnormal findings of blood chemistry: Secondary | ICD-10-CM

## 2018-09-01 LAB — BASIC METABOLIC PANEL
BUN: 25 mg/dL — ABNORMAL HIGH (ref 6–23)
CO2: 24 mEq/L (ref 19–32)
Calcium: 9.1 mg/dL (ref 8.4–10.5)
Chloride: 105 mEq/L (ref 96–112)
Creatinine, Ser: 1.2 mg/dL (ref 0.40–1.20)
GFR: 42.81 mL/min — ABNORMAL LOW (ref >60.00)
Glucose, Bld: 80 mg/dL (ref 70–99)
Potassium: 5 mEq/L (ref 3.5–5.1)
Sodium: 138 mEq/L (ref 135–145)

## 2018-09-16 DIAGNOSIS — H31092 Other chorioretinal scars, left eye: Secondary | ICD-10-CM | POA: Diagnosis not present

## 2018-09-16 DIAGNOSIS — H33302 Unspecified retinal break, left eye: Secondary | ICD-10-CM | POA: Diagnosis not present

## 2018-09-16 DIAGNOSIS — H2513 Age-related nuclear cataract, bilateral: Secondary | ICD-10-CM | POA: Diagnosis not present

## 2018-09-17 ENCOUNTER — Ambulatory Visit
Admission: RE | Admit: 2018-09-17 | Discharge: 2018-09-17 | Disposition: A | Discharge status: Home / Self Care | Payer: Medicare Other | Source: Ambulatory Visit | Attending: Family Medicine | Admitting: Family Medicine

## 2018-09-17 ENCOUNTER — Other Ambulatory Visit: Payer: Self-pay

## 2018-09-17 DIAGNOSIS — Z1231 Encounter for screening mammogram for malignant neoplasm of breast: Secondary | ICD-10-CM

## 2018-09-28 DIAGNOSIS — H2512 Age-related nuclear cataract, left eye: Secondary | ICD-10-CM | POA: Diagnosis not present

## 2018-10-05 DIAGNOSIS — H2511 Age-related nuclear cataract, right eye: Secondary | ICD-10-CM | POA: Diagnosis not present

## 2018-10-07 ENCOUNTER — Ambulatory Visit (INDEPENDENT_AMBULATORY_CARE_PROVIDER_SITE_OTHER): Payer: Medicare Other | Admitting: Cardiothoracic Surgery

## 2018-10-07 ENCOUNTER — Ambulatory Visit
Admission: RE | Admit: 2018-10-07 | Discharge: 2018-10-07 | Disposition: A | Discharge status: Home / Self Care | Payer: Medicare Other | Source: Ambulatory Visit | Attending: Cardiothoracic Surgery | Admitting: Cardiothoracic Surgery

## 2018-10-07 ENCOUNTER — Other Ambulatory Visit: Payer: Self-pay

## 2018-10-07 ENCOUNTER — Encounter: Payer: Self-pay | Admitting: Cardiothoracic Surgery

## 2018-10-07 VITALS — BP 170/80 | HR 85 | Temp 97.7°F | Resp 20 | Ht 63.0 in | Wt 217.0 lb

## 2018-10-07 DIAGNOSIS — J9859 Other diseases of mediastinum, not elsewhere classified: Secondary | ICD-10-CM

## 2018-10-07 MED ORDER — IOPAMIDOL (ISOVUE-300) INJECTION 61%
75.0000 mL | Freq: Once | INTRAVENOUS | Status: AC | PRN
  Administered 2018-10-07: 75 mL via INTRAVENOUS

## 2018-10-07 NOTE — Progress Notes (Signed)
PCP is Midge Minium, MD Referring Provider is Tegeler, Gwenyth Allegra,*  Chief Complaint  Patient presents with  . Mediastinal Mass    f/u with chest CT,review requested studies, stress test was not completed    HPI: 85 year old female with 6 cm anterior mediastinal thymic cyst diagnosed 6 months ago returns for follow-up.  A follow-up CT scan showed only slight enlargement of approximately 0.5 cm since the previous scan.  She denies symptoms.  She shows no signs of venous obstruction.  She does not wish surgery unless the problem would be life-threatening which it is not.  She recently had cataract surgery and has had a difficult time recovering.  Because we have planned on potential resection of this cyst last spring a Myoview scan was ordered but she never agreed to complete the study.  Because of her advanced age and poor general condition and lack of symptoms I have recommended to her that we hold off on surgery and further scans at this time.  She will let us know if she develops any symptoms of chest pain or venous obstruction-swollen hands or face- and she will return in 3 months to discuss her symptoms and any new problems.   Past Medical History:  Diagnosis Date  . Arthritis   . Bacterial infection    in lungs in Jan 2015  . Bilateral lower extremity edema   . Cancer ([place])    skin cancer, back, legs melonama, sees Dr Lisabeth Pick, derm  . Chronic back pain   . GERD (gastroesophageal reflux disease)    takes Omeprazole daily  . Headache(784.0)   . History of migraine    last one about 15 yrs ago  . Hyperlipidemia    takes Zetia daily  . Hypertension    takes HCTZ daily  . Joint pain   . Joint swelling   . Myocardial infarction [facility])    pt states EKG always shows infarct but no change from previous ekg;never knew anything about it  . Nocturia   . Osteoarthritis   . Pneumonia    hx of-many yrs ago  . Pneumonia   . PONV (postoperative nausea and vomiting)   .  Shortness of breath    with exertion  . Urinary frequency   . Urinary incontinence   . Urinary urgency     Past Surgical History:  Procedure Laterality Date  . bladder tack  1998  . BLEPHAROPLASTY Bilateral   . JOINT REPLACEMENT Left 10/02/2009  . JOINT REPLACEMENT Right 02-22-13   Knee  . KNEE SURGERY Bilateral 2009 and 2011  . OOPHORECTOMY  1998   only one  . TONSILLECTOMY  as a child ? date  . TOTAL KNEE ARTHROPLASTY Right 02/22/2013   DR Ronnie Derby  . TOTAL KNEE ARTHROPLASTY Right 02/22/2013   Procedure: TOTAL KNEE ARTHROPLASTY;  Surgeon: Vickey Huger, MD;  Location: [place] [facility];  Service: Orthopedics;  Laterality: Right;    Family History  Problem Relation Age of Onset  . Kidney disease Mother   . Heart disease Mother   . Hypertension Mother   . Varicose Veins Mother   . Kidney failure Mother   . Alcohol abuse Father   . Stroke Father        several   . Hypertension Father   . Varicose Veins Father   . Alcohol abuse Brother   . Hyperlipidemia Brother   . Hypertension Brother   . Early death Brother   . Varicose Veins Brother   . Heart  disease Brother   . Hypertension Brother   . Arthritis Brother        Gout  . Varicose Veins Son   . Deep vein thrombosis Son   . Breast cancer Maternal Aunt     Social History Social History   Tobacco Use  . Smoking status: Never Smoker  . Smokeless tobacco: Never Used  Substance Use Topics  . Alcohol use: No  . Drug use: No    Current Outpatient Medications  Medication Sig Dispense Refill  . acetaminophen (TYLENOL) 500 MG tablet Take 1 tablet (500 mg total) by mouth every 8 (eight) hours as needed for moderate pain. 90 tablet 1  . allopurinol (ZYLOPRIM) 300 MG tablet Take 300 mg by mouth daily.  0  . Calcium 600-200 MG-UNIT tablet Take 1 tablet by mouth daily.    [name] co-enzyme Q-10 30 MG capsule Take 30 mg by mouth daily.    [name] ezetimibe (ZETIA) 10 MG tablet TAKE 1 TABLET BY MOUTH ONCE DAILY 90 tablet 0  . fenofibrate 160 MG  tablet TAKE 1 TABLET BY MOUTH ONCE DAILY 90 tablet 3  . levothyroxine (SYNTHROID, LEVOTHROID) 50 MCG tablet TAKE 1 TABLET BY MOUTH ONCE DAILY 210 tablet 0  . loratadine (CLARITIN) 10 MG tablet Take 1 tablet (10 mg total) by mouth daily.    . NON FORMULARY Take 1 tablet by mouth daily.    [name] omeprazole (PRILOSEC) 20 MG capsule Take 20 mg by mouth daily.    [name] triamcinolone (NASACORT) 55 MCG/ACT AERO nasal inhaler Place 2 sprays into the nose daily. 1 Inhaler 12   No current facility-administered medications for this visit.     Allergies  Allergen Reactions  . Aspirin Other (See Comments)    Noted caused 2 holes in retina, not sure   . Codeine Hives  . Statins     Intolerance to statins  . Tizanidine Hcl Other (See Comments)    Confusion    Review of Systems  Recovering from recent cataract surgery Lives alone She takes her blood pressure at home is usually runs less than [id number] systolic  BP (!) 0000000   Pulse 85   Temp 97.7 F (36.5 C) (Skin)   Resp 20   Ht 5\' 3"  (1.6 m)   Wt 217 lb (98.4 kg)   SpO2 93% Comment: RA  BMI 38.44 kg/m  Physical Exam Alert anxious wearing cataract dark glasses No palpable nodes in the neck no JVD Breath sounds clear Heart rhythm regular no murmur Extremities without edema and with good pulses Neuro intact Chronic pretibial edema bilaterally  Diagnostic Tests: Chest images personally reviewed and counseled with patient.  Minimal increase in size of the anterior mediastinal thymic cyst.  Impression: Asymptomatic benign cyst.  Although it is large it is not causing symptoms and recovering from the surgery for resection of the cyst would be very difficult for this patient.  We will hold off on surgery and follow the patient.  Plan: Return in 3 months without repeat scan to discuss her symptoms.  She will call us if she has problems.   Len Childs, MD [facility] [phone]

## 2018-10-12 DIAGNOSIS — H2512 Age-related nuclear cataract, left eye: Secondary | ICD-10-CM | POA: Diagnosis not present

## 2018-10-12 DIAGNOSIS — H2511 Age-related nuclear cataract, right eye: Secondary | ICD-10-CM | POA: Diagnosis not present

## 2018-10-21 ENCOUNTER — Telehealth: Payer: Self-pay | Admitting: *Deleted

## 2018-10-21 NOTE — Telephone Encounter (Signed)
Nurse with patient's insurance company called - states that she has been talking to patient who states that she had previously had PT on her shoulder.  She states that the order has run out and she would like to know if Dr. Birdie Riddle will do a new referral for PT or if patient will need to come in for an appointment before an order can be placed.    Asked that we call patient back to let her know.

## 2018-10-21 NOTE — Telephone Encounter (Signed)
Happy to refer but if she wants home PT will need face to face

## 2018-10-22 NOTE — Telephone Encounter (Signed)
Called pt and LMOVm to inform that if:  She wants in-home PT she must have a Face-to-face visit (can be in office or VV but cannot be telephone only)  She wants to go to a PT office no appt needed just problem and I can place the referral.

## 2018-10-23 NOTE — Telephone Encounter (Signed)
Called pt and left another message

## 2018-10-26 ENCOUNTER — Encounter: Payer: Self-pay | Admitting: Family Medicine

## 2018-10-26 ENCOUNTER — Other Ambulatory Visit: Payer: Self-pay

## 2018-10-26 ENCOUNTER — Ambulatory Visit (INDEPENDENT_AMBULATORY_CARE_PROVIDER_SITE_OTHER): Payer: Medicare Other | Admitting: Family Medicine

## 2018-10-26 VITALS — BP 130/90 | HR 80 | Temp 97.8°F | Resp 17 | Ht 63.0 in | Wt 220.2 lb

## 2018-10-26 DIAGNOSIS — G8929 Other chronic pain: Secondary | ICD-10-CM

## 2018-10-26 DIAGNOSIS — H269 Unspecified cataract: Secondary | ICD-10-CM

## 2018-10-26 DIAGNOSIS — M25512 Pain in left shoulder: Secondary | ICD-10-CM | POA: Diagnosis not present

## 2018-10-26 DIAGNOSIS — Z23 Encounter for immunization: Secondary | ICD-10-CM

## 2018-10-26 NOTE — Progress Notes (Signed)
   Subjective:    Patient ID: Kristen Manning, female    DOB: 26-Sep-1932, 85 y.o.   MRN: [id number]  HPI R cataract- recently operated on.  Pt reports 'things have not been right since'.  Is asking for a referral for a 2nd opinion.  L shoulder pain- pt reports 'they hit a nerve' when she got her shingles shot in July 2019.  Saw another provider who told her she had a 'frozen shoulder' and started PT.  She was going to PT at [facility] but was told she wasn't able to continue- no reason given.  Pt is unable to raise L arm above her shoulder.  Pt describes the pain as 'a constant ache'.  Has not seen Ortho regarding this issue (sees Kristen Manning)  Pt is not able to get out and get to PT due to the pandemic.  Unable to see clearly due to cataract issue above.  She is asking for [facility] PT.     Review of Systems For ROS see HPI     Objective:   Physical Exam Vitals signs reviewed.  Constitutional:      Appearance: Normal appearance. She is obese.  HENT:     Head: Normocephalic and atraumatic.     Ears:     Comments: R eye tearing Musculoskeletal:     Comments: L shoulder forward flexion limited to 90 degrees, limited abduction, internal/external rotation  Skin:    General: Skin is warm and dry.  Neurological:     General: No focal deficit present.     Mental Status: She is alert. Mental status is at baseline.  Psychiatric:     Comments: Anxious but otherwise WNL           Assessment & Plan:  R cataract- new.  pt reports a bad experience w/ the surgery on her R eye after a successful L eye.  She reports she has blurry vision and 'it's just not right'.  Has had a difficult time w/ current office and would like a 2nd opinion.  Referral provided.  L shoulder pain- new to provider, ongoing issue for pt.  Was in PT when pandemic started and she has not been able to return.  She has not seen ortho for this issue (sees Kristen Manning).  Referral placed for Ortho and for HHPT as she is not able to get  out for appts at this time due to cataract and pandemic.  Pt appreciative.  Will follow.

## 2018-10-26 NOTE — Patient Instructions (Signed)
Follow up as needed or as scheduled We'll call you with your Orthopedic appt and eye appt Someone should call you about setting up home health PT Call with any questions or concerns Hang in there!!!

## 2018-10-29 DIAGNOSIS — M25512 Pain in left shoulder: Secondary | ICD-10-CM | POA: Diagnosis not present

## 2018-10-29 DIAGNOSIS — G8929 Other chronic pain: Secondary | ICD-10-CM | POA: Diagnosis not present

## 2018-11-06 ENCOUNTER — Telehealth: Payer: Self-pay | Admitting: *Deleted

## 2018-11-06 NOTE — Telephone Encounter (Signed)
Nurse called to give Plan of Care from [facility].   She will be receiving care twice a week for 4 weeks and then they will reassess and give [place] an update.

## 2018-11-10 ENCOUNTER — Other Ambulatory Visit: Payer: Self-pay | Admitting: General Practice

## 2018-11-10 MED ORDER — FENOFIBRATE 160 MG PO TABS: 160.0000 mg | ORAL_TABLET | Freq: Every day | ORAL | 3 refills | Status: DC

## 2018-11-26 DIAGNOSIS — Z961 Presence of intraocular lens: Secondary | ICD-10-CM | POA: Diagnosis not present

## 2018-11-26 DIAGNOSIS — H01026 Squamous blepharitis left eye, unspecified eyelid: Secondary | ICD-10-CM | POA: Diagnosis not present

## 2018-11-26 DIAGNOSIS — H01023 Squamous blepharitis right eye, unspecified eyelid: Secondary | ICD-10-CM | POA: Diagnosis not present

## 2018-11-26 DIAGNOSIS — H04121 Dry eye syndrome of right lacrimal gland: Secondary | ICD-10-CM | POA: Diagnosis not present

## 2018-11-30 ENCOUNTER — Telehealth: Payer: Self-pay | Admitting: Family Medicine

## 2018-11-30 NOTE — Telephone Encounter (Signed)
Paperwork given to PCP for completion.  

## 2018-11-30 NOTE — Telephone Encounter (Signed)
I have placed a plan of care from [facility] in the bin upfront with a charge sheet.

## 2018-12-01 DIAGNOSIS — R32 Unspecified urinary incontinence: Secondary | ICD-10-CM

## 2018-12-01 DIAGNOSIS — E785 Hyperlipidemia, unspecified: Secondary | ICD-10-CM

## 2018-12-01 DIAGNOSIS — D1432 Benign neoplasm of left bronchus and lung: Secondary | ICD-10-CM

## 2018-12-01 DIAGNOSIS — M150 Primary generalized (osteo)arthritis: Secondary | ICD-10-CM

## 2018-12-01 DIAGNOSIS — I10 Essential (primary) hypertension: Secondary | ICD-10-CM | POA: Diagnosis not present

## 2018-12-01 DIAGNOSIS — H269 Unspecified cataract: Secondary | ICD-10-CM

## 2018-12-01 DIAGNOSIS — F419 Anxiety disorder, unspecified: Secondary | ICD-10-CM

## 2018-12-01 DIAGNOSIS — G8929 Other chronic pain: Secondary | ICD-10-CM | POA: Diagnosis not present

## 2018-12-01 DIAGNOSIS — M25512 Pain in left shoulder: Secondary | ICD-10-CM | POA: Diagnosis not present

## 2018-12-01 DIAGNOSIS — M109 Gout, unspecified: Secondary | ICD-10-CM | POA: Diagnosis not present

## 2018-12-01 DIAGNOSIS — Z96653 Presence of artificial knee joint, bilateral: Secondary | ICD-10-CM

## 2018-12-01 DIAGNOSIS — E039 Hypothyroidism, unspecified: Secondary | ICD-10-CM

## 2018-12-01 DIAGNOSIS — G44209 Tension-type headache, unspecified, not intractable: Secondary | ICD-10-CM

## 2018-12-01 NOTE — Telephone Encounter (Signed)
Form completed and placed in basket  

## 2018-12-01 NOTE — Telephone Encounter (Signed)
Picked up forms and faxed to the # provided on the plan of care.

## 2018-12-01 NOTE — Telephone Encounter (Signed)
FYI

## 2018-12-07 ENCOUNTER — Telehealth: Payer: Self-pay | Admitting: Family Medicine

## 2018-12-07 NOTE — Telephone Encounter (Signed)
Disregard

## 2018-12-15 ENCOUNTER — Ambulatory Visit: Payer: Medicare Other | Admitting: Neurology

## 2018-12-17 DIAGNOSIS — G8929 Other chronic pain: Secondary | ICD-10-CM | POA: Diagnosis not present

## 2018-12-17 DIAGNOSIS — M25512 Pain in left shoulder: Secondary | ICD-10-CM | POA: Diagnosis not present

## 2018-12-17 DIAGNOSIS — R262 Difficulty in walking, not elsewhere classified: Secondary | ICD-10-CM | POA: Diagnosis not present

## 2018-12-17 DIAGNOSIS — M6281 Muscle weakness (generalized): Secondary | ICD-10-CM | POA: Diagnosis not present

## 2018-12-25 DIAGNOSIS — H04121 Dry eye syndrome of right lacrimal gland: Secondary | ICD-10-CM | POA: Diagnosis not present

## 2018-12-25 DIAGNOSIS — Z961 Presence of intraocular lens: Secondary | ICD-10-CM | POA: Diagnosis not present

## 2018-12-25 DIAGNOSIS — H01026 Squamous blepharitis left eye, unspecified eyelid: Secondary | ICD-10-CM | POA: Diagnosis not present

## 2018-12-25 DIAGNOSIS — H01023 Squamous blepharitis right eye, unspecified eyelid: Secondary | ICD-10-CM | POA: Diagnosis not present

## 2018-12-29 ENCOUNTER — Telehealth: Payer: Self-pay | Admitting: Family Medicine

## 2018-12-29 NOTE — Telephone Encounter (Signed)
Picked up from the back and faxed  

## 2018-12-29 NOTE — Telephone Encounter (Signed)
Form completed and placed in basket  

## 2018-12-29 NOTE — Telephone Encounter (Signed)
I have placed home health orders in the bin up front with a charge sheet  

## 2018-12-29 NOTE — Telephone Encounter (Signed)
Paperwork given to PCP for completion.  

## 2018-12-29 NOTE — Telephone Encounter (Signed)
Fyi.

## 2019-01-13 ENCOUNTER — Ambulatory Visit: Payer: Medicare Other | Admitting: Cardiothoracic Surgery

## 2019-02-03 ENCOUNTER — Telehealth: Payer: Medicare Other | Admitting: Cardiothoracic Surgery

## 2019-02-03 ENCOUNTER — Telehealth: Payer: Self-pay | Admitting: Cardiothoracic Surgery

## 2019-02-03 NOTE — Telephone Encounter (Signed)
[address]       [place], [address]             [phone]     CARDIOTHORACIC SURGERY TELEPHONE VIRTUAL OFFICE NOTE  Referring Provider is No ref. provider found Primary Cardiologist is No primary care provider on file. PCP is Midge Minium, MD   HPI:  I spoke with Kristen Manning (DOB 1932-08-22 ) via telephone on 02/03/2019 at 2:38 PM and verified that I was speaking with the correct person using more than one form of identification.  We discussed the reason(s) for conducting our visit virtually instead of in-person.  The patient expressed understanding the circumstances and agreed to proceed as described.  The telephone call was completed with this 86 year old patient who was was diagnosed with a mediastinal mass consistent with a thymic cyst [low activity on PET scan] about 6 months ago.  She was asymptomatic and did not want surgery.  Today I checked with the patient and see how she was feeling.  She remains asymptomatic.  She denies pain, difficulty swallowing, or shortness of breath.  She is concerned over the COVID-19 pandemic and has remained isolated and protected.  No symptoms of fever cough shortness of breath chest pain loss of taste and smell   Current Outpatient Medications  Medication Sig Dispense Refill  . acetaminophen (TYLENOL) 500 MG tablet Take 1 tablet (500 mg total) by mouth every 8 (eight) hours as needed for moderate pain. 90 tablet 1  . allopurinol (ZYLOPRIM) 300 MG tablet Take 300 mg by mouth daily.  0  . Calcium 600-200 MG-UNIT tablet Take 1 tablet by mouth daily.    [name] co-enzyme Q-10 30 MG capsule Take 30 mg by mouth daily.    [name] doxycycline (VIBRA-TABS) 100 MG tablet TK 1 T PO BID    . ezetimibe (ZETIA) 10 MG tablet TAKE 1 TABLET BY MOUTH ONCE DAILY 90 tablet 0  . fenofibrate 160 MG tablet Take 1 tablet (160 mg total) by mouth daily. 90 tablet 3  . levothyroxine (SYNTHROID, LEVOTHROID) 50 MCG tablet TAKE 1 TABLET BY MOUTH  ONCE DAILY 210 tablet 0  . loratadine (CLARITIN) 10 MG tablet Take 1 tablet (10 mg total) by mouth daily.    . NON FORMULARY Take 1 tablet by mouth daily.    [name] omeprazole (PRILOSEC) 20 MG capsule Take 20 mg by mouth daily.    [name] triamcinolone (NASACORT) 55 MCG/ACT AERO nasal inhaler Place 2 sprays into the nose daily. 1 Inhaler 12   No current facility-administered medications for this visit.     Diagnostic Tests:  No images done today previous CT scan images personally reviewed showing a round smooth cystlike structure with benign characteristics on PET scan in the anterior mediastinum   Impression:  Benign mediastinal tumor, asymptomatic This 86 year old patient does not want surgery remained symptom-free. Plan:  Patient does not wish more scans or surgical follow-up.  She will call us if she develops symptoms or would like to have another scan to assess the tumor.  She understands there is no medical therapy which would be effective in treating this.    I discussed limitations of evaluation and management via telephone.  The patient was advised to call back for repeat telephone consultation or to seek an in-person evaluation if questions arise or the patient's clinical condition changes in any significant manner.  I spent in excess of 5 minutes of non-face-to-face time during the conduct of  this telephone virtual office consultation.  Level 1  (99441)             5-10 minutes Level 2  (99442)            11-20 minutes Level 3  (99443)            21-30 minutes  Plan 02/03/2019 2:38 PM

## 2019-02-04 ENCOUNTER — Other Ambulatory Visit: Payer: Self-pay | Admitting: General Practice

## 2019-02-04 MED ORDER — EZETIMIBE 10 MG PO TABS: 10.0000 mg | ORAL_TABLET | Freq: Every day | ORAL | 0 refills | Status: DC

## 2019-02-09 ENCOUNTER — Other Ambulatory Visit: Payer: Self-pay

## 2019-02-09 ENCOUNTER — Ambulatory Visit (INDEPENDENT_AMBULATORY_CARE_PROVIDER_SITE_OTHER): Payer: Medicare Other | Admitting: Family Medicine

## 2019-02-09 ENCOUNTER — Encounter: Payer: Self-pay | Admitting: Family Medicine

## 2019-02-09 VITALS — BP 136/80 | HR 85 | Temp 97.3°F | Ht 63.0 in | Wt 210.0 lb

## 2019-02-09 DIAGNOSIS — E038 Other specified hypothyroidism: Secondary | ICD-10-CM | POA: Diagnosis not present

## 2019-02-09 DIAGNOSIS — E782 Mixed hyperlipidemia: Secondary | ICD-10-CM | POA: Diagnosis not present

## 2019-02-09 NOTE — Progress Notes (Signed)
I have discussed the procedure for the virtual visit with the patient who has given consent to proceed with assessment and treatment.   Jaliya Siegmann L Zebulen Simonis, CMA     

## 2019-02-09 NOTE — Progress Notes (Signed)
Virtual Visit via Video   I connected with patient on 02/09/19 at  8:30 AM EST by a video enabled telemedicine application and verified that I am speaking with the correct person using two identifiers.  Location patient: Home Location provider: Acupuncturist, Office Persons participating in the virtual visit: Patient, Provider, [place] (Jess B)  I discussed the limitations of evaluation and management by telemedicine and the availability of in person appointments. The patient expressed understanding and agreed to proceed.  Interactive audio and video telecommunications were attempted between this provider and patient, however failed, due to patient having technical difficulties OR patient did not have access to video capability.  We continued and completed visit with audio only.   Subjective:   HPI:   Hyperlipidemia- chronic problem, on Zetia 10mg  and Fenofibrate 160mg  daily.  No CP, SOB above baseline, abd pain, N/V.  Hypothyroid- chronic problem, on Levothyroxine 76mcg daily.  Denies changes to skin/hair/nails.  Energy level unchanged.  Obesity- ongoing issue.  BMI is 37.2.  Pt is doing her PT exercises at home.  She enjoys walking but is scared to do it alone.  Has a rollator and a life alert.  Not following any particular diet  ROS:   See pertinent positives and negatives per HPI.  Patient Active Problem List   Diagnosis Date Noted  . Morbid obesity ([place]) 02/11/2018  . DDD (degenerative disc disease), lumbar 10/07/2016  . Primary osteoarthritis of both hands 10/07/2016  . Primary osteoarthritis of both feet 10/07/2016  . Tension-type headache, not intractable 09/16/2016  . GERD (gastroesophageal reflux disease) 09/11/2016  . ANA positive 09/02/2016  . History of total knee replacement, bilateral 03/20/2015  . Hypothyroidism 09/21/2014  . Gout 06/20/2014  . Lower leg edema 06/13/2014  . Chest wall pain 04/29/2014  . Meningioma ([place]) 10/07/2013  . Chronic  tension-type headache, intractable 10/07/2013  . Depression 06/23/2013  . Insomnia 05/26/2013  . Routine general medical examination at a health care facility 10/17/2011  . HTN (hypertension) 07/03/2011  . Hyperlipidemia 07/03/2011  . Venous insufficiency, peripheral 07/03/2011    Social History   Tobacco Use  . Smoking status: Never Smoker  . Smokeless tobacco: Never Used  Substance Use Topics  . Alcohol use: No    Current Outpatient Medications:  .  acetaminophen (TYLENOL) 500 MG tablet, Take 1 tablet (500 mg total) by mouth every 8 (eight) hours as needed for moderate pain., Disp: 90 tablet, Rfl: 1 .  allopurinol (ZYLOPRIM) 300 MG tablet, Take 300 mg by mouth daily., Disp: , Rfl: 0 .  Calcium 600-200 MG-UNIT tablet, Take 1 tablet by mouth daily., Disp: , Rfl:  .  co-enzyme Q-10 30 MG capsule, Take 30 mg by mouth daily., Disp: , Rfl:  .  ezetimibe (ZETIA) 10 MG tablet, Take 1 tablet (10 mg total) by mouth daily., Disp: 90 tablet, Rfl: 0 .  fenofibrate 160 MG tablet, Take 1 tablet (160 mg total) by mouth daily., Disp: 90 tablet, Rfl: 3 .  furosemide (LASIX) 40 MG tablet, , Disp: , Rfl:  .  levothyroxine (SYNTHROID, LEVOTHROID) 50 MCG tablet, TAKE 1 TABLET BY MOUTH ONCE DAILY, Disp: 210 tablet, Rfl: 0 .  loratadine (CLARITIN) 10 MG tablet, Take 1 tablet (10 mg total) by mouth daily., Disp: , Rfl:  .  MAXITROL 3.5-10000-0.1 OINT, Apply 1 Application Both Eyes every evening, Disp: , Rfl:  .  NON FORMULARY, Take 1 tablet by mouth daily., Disp: , Rfl:  .  omeprazole (PRILOSEC) 20 MG  capsule, Take 20 mg by mouth daily., Disp: , Rfl:  .  prednisoLONE acetate (PRED FORTE) 1 % ophthalmic suspension, 1 drop every morning., Disp: , Rfl:  .  triamcinolone (NASACORT) 55 MCG/ACT AERO nasal inhaler, Place 2 sprays into the nose daily., Disp: 1 Inhaler, Rfl: 12  Allergies  Allergen Reactions  . Aspirin Other (See Comments)    Noted caused 2 holes in retina, not sure   . Codeine Hives  .  Statins     Intolerance to statins  . Tizanidine Hcl Other (See Comments)    Confusion    Objective:   Temp (!) 97.3 F (36.3 C) (Tympanic)   Ht 5\' 3"  (1.6 m)   Wt 210 lb (95.3 kg)   BMI 37.20 kg/m  Pt is able to speak clearly, coherently without shortness of breath or increased work of breathing. Thought process is linear.  Mood is appropriate.   Assessment and Plan:   Hyperlipidemia- chronic problem, on Zetia and Fenofibrate w/o difficulty.  Check labs.  Adjust meds prn   Hypothyroid- chronic problem, currently asymptomatic.  Check labs.  Adjust meds prn   Obesity- ongoing issue for pt.  She is doing her home PT exercises regularly and walking when weather permits.  Applauded her efforts.  Check labs to risk stratify.   Annye Asa, MD 02/09/2019   Time spent with the patient: 13 minutes, of which >50% was spent in obtaining information about symptoms, reviewing previous labs, evaluations, and treatments, counseling about condition (please see the discussed topics above), and developing a plan to further investigate it; had a number of questions which I addressed.

## 2019-02-10 ENCOUNTER — Ambulatory Visit (INDEPENDENT_AMBULATORY_CARE_PROVIDER_SITE_OTHER): Payer: Medicare Other

## 2019-02-10 ENCOUNTER — Other Ambulatory Visit: Payer: Self-pay

## 2019-02-10 DIAGNOSIS — E782 Mixed hyperlipidemia: Secondary | ICD-10-CM

## 2019-02-10 DIAGNOSIS — E038 Other specified hypothyroidism: Secondary | ICD-10-CM

## 2019-02-10 LAB — LIPID PANEL
Cholesterol: 193 mg/dL (ref 0–200)
HDL: 58.5 mg/dL (ref >39.00)
LDL Cholesterol: 112 mg/dL — ABNORMAL HIGH (ref 0–99)
NonHDL: 134.24
Total CHOL/HDL Ratio: 3
Triglycerides: 109 mg/dL (ref 0.0–149.0)
VLDL: 21.8 mg/dL (ref 0.0–40.0)

## 2019-02-10 LAB — CBC WITH DIFFERENTIAL/PLATELET
Basophils Absolute: 0.1 10*3/uL (ref 0.0–0.1)
Basophils Relative: 0.9 % (ref 0.0–3.0)
Eosinophils Absolute: 0.1 10*3/uL (ref 0.0–0.7)
Eosinophils Relative: 1.1 % (ref 0.0–5.0)
HCT: 41 % (ref 36.0–46.0)
Hemoglobin: 13.5 g/dL (ref 12.0–15.0)
Lymphocytes Relative: 21.7 % (ref 12.0–46.0)
Lymphs Abs: 1.9 10*3/uL (ref 0.7–4.0)
MCHC: 32.9 g/dL (ref 30.0–36.0)
MCV: 91.2 fl (ref 78.0–100.0)
Monocytes Absolute: 0.7 10*3/uL (ref 0.1–1.0)
Monocytes Relative: 7.6 % (ref 3.0–12.0)
Neutro Abs: 6.1 10*3/uL (ref 1.4–7.7)
Neutrophils Relative %: 68.7 % (ref 43.0–77.0)
Platelets: 320 10*3/uL (ref 150.0–400.0)
RBC: 4.5 Mil/uL (ref 3.87–5.11)
RDW: 13.4 % (ref 11.5–15.5)
WBC: 8.9 10*3/uL (ref 4.0–10.5)

## 2019-02-10 LAB — BASIC METABOLIC PANEL
BUN: 25 mg/dL — ABNORMAL HIGH (ref 6–23)
CO2: 29 mEq/L (ref 19–32)
Calcium: 9.6 mg/dL (ref 8.4–10.5)
Chloride: 102 mEq/L (ref 96–112)
Creatinine, Ser: 1.36 mg/dL — ABNORMAL HIGH (ref 0.40–1.20)
GFR: 37.01 mL/min — ABNORMAL LOW (ref >60.00)
Glucose, Bld: 95 mg/dL (ref 70–99)
Potassium: 5.3 mEq/L — ABNORMAL HIGH (ref 3.5–5.1)
Sodium: 139 mEq/L (ref 135–145)

## 2019-02-10 LAB — HEPATIC FUNCTION PANEL
ALT: 12 U/L (ref 0–35)
AST: 17 U/L (ref 0–37)
Albumin: 4 g/dL (ref 3.5–5.2)
Alkaline Phosphatase: 93 U/L (ref 39–117)
Bilirubin, Direct: 0.1 mg/dL (ref 0.0–0.3)
Total Bilirubin: 0.7 mg/dL (ref 0.2–1.2)
Total Protein: 7.4 g/dL (ref 6.0–8.3)

## 2019-02-10 LAB — TSH: TSH: 5.98 u[IU]/mL — ABNORMAL HIGH (ref 0.35–4.50)

## 2019-02-11 ENCOUNTER — Other Ambulatory Visit: Payer: Self-pay

## 2019-02-11 DIAGNOSIS — R7989 Other specified abnormal findings of blood chemistry: Secondary | ICD-10-CM

## 2019-02-11 DIAGNOSIS — N289 Disorder of kidney and ureter, unspecified: Secondary | ICD-10-CM

## 2019-02-11 MED ORDER — LEVOTHYROXINE SODIUM 88 MCG PO TABS: 88.0000 ug | ORAL_TABLET | Freq: Every day | ORAL | 1 refills | Status: DC

## 2019-02-12 DIAGNOSIS — H01023 Squamous blepharitis right eye, unspecified eyelid: Secondary | ICD-10-CM | POA: Diagnosis not present

## 2019-02-12 DIAGNOSIS — H01026 Squamous blepharitis left eye, unspecified eyelid: Secondary | ICD-10-CM | POA: Diagnosis not present

## 2019-02-12 DIAGNOSIS — Z961 Presence of intraocular lens: Secondary | ICD-10-CM | POA: Diagnosis not present

## 2019-02-12 DIAGNOSIS — H04121 Dry eye syndrome of right lacrimal gland: Secondary | ICD-10-CM | POA: Diagnosis not present

## 2019-03-08 ENCOUNTER — Ambulatory Visit: Payer: Medicare Other

## 2019-03-10 DIAGNOSIS — G8929 Other chronic pain: Secondary | ICD-10-CM | POA: Insufficient documentation

## 2019-03-10 DIAGNOSIS — M19012 Primary osteoarthritis, left shoulder: Secondary | ICD-10-CM | POA: Diagnosis not present

## 2019-03-10 DIAGNOSIS — M25712 Osteophyte, left shoulder: Secondary | ICD-10-CM | POA: Diagnosis not present

## 2019-03-12 NOTE — Progress Notes (Signed)
Pt chart updated for appointment

## 2019-03-15 ENCOUNTER — Encounter: Payer: Self-pay | Admitting: Neurology

## 2019-03-15 ENCOUNTER — Other Ambulatory Visit: Payer: Self-pay

## 2019-03-15 ENCOUNTER — Telehealth (INDEPENDENT_AMBULATORY_CARE_PROVIDER_SITE_OTHER): Payer: Medicare Other | Admitting: Neurology

## 2019-03-15 DIAGNOSIS — G629 Polyneuropathy, unspecified: Secondary | ICD-10-CM

## 2019-03-15 NOTE — Progress Notes (Signed)
Telephone (Audio) Visit The purpose of this telephone visit is to provide medical care while limiting exposure to the novel coronavirus.    Consent was obtained for telephone visit:  Yes.   Answered questions that patient had about telehealth interaction:  Yes.   I discussed the limitations, risks, security and privacy concerns of performing an evaluation and management service by telephone. I also discussed with the patient that there may be a patient responsible charge related to this service. The patient expressed understanding and agreed to proceed.  Pt location: Home Physician Location: office Name of referring provider:  Rutherford Guys, MD I connected with .Kristen Manning at patients initiation/request on 03/15/2019 at 11:30 AM EST by telephone and verified that I am speaking with the correct person using two identifiers.  Pt MRN:  [id number] Pt DOB:  01-Nov-1932   History of Present Illness:  The patient had a telephone visit on 03/15/2019. She was last seen in the neurology clinic in September 2019 for tension-type headaches and neuropathy. Since her last visit, she reports doing very well with no recurrence of the significant headaches she initially presented with. Headaches are mostly sinus headaches with light morning headaches. On her last visit, she was started on low dose nortriptyline for neuropathy but she is not taking it. She feels neuropathy is about the same, some days are good. She feels symptoms are tolerable rather than taking sedating medications for neuropathy. She reports a spell last Tuesday after she received a steroid injection for frozen shoulder. She had the frozen shoulder after a shingles injection in 2019, and has been getting steroid injections every 3 months which really help. After her steroid injection last Tuesday, she came home and her eyes were blurred. She noticed she was staggering when walking and had a terrible headache. She stayed up late because of  fear of dying in her sleep. She had noticed a few days prior that her face was pale, but after the shot, her face was red and hot. She is feeling much better now, her face is still red but not hot like it was a few days ago. There is no further arm pain, she can lift her arm and is back to her active self.   HPI: This is a pleasant 86 yo RH woman with a history of hypertension, hyperlipidemia, melanoma, who presented with headaches that started after knee surgery last February 2015. She had a difficult time during the admission, with no recollection of her hospital stay. She asked for hospital records which reported walking several steps, which she does not remember. She recalls waking up in pain, and "waking up" when she was to be transferred to the nursing home. She did not have a good experience at the SNF, and feels she had headaches at that time but was taking pain medications for her knee. She started to notice the daily headaches once she got home in March. She reports headaches are constant over the left temporal region, usually a 2 or 3/10, but increasing in severity up to a 5 or 6/10. Headaches would radiate over the periorbital, frontal, and occipital regions, described as a throbbing pain that would cause some dizziness and nausea when more severe. She has a more intense headache today. She has noticed blurred vision in both eyes. She takes Advil 2-3 times a week when pain is more severe, but it only helps calm the pain down. She denies having any headache-free days in the past 4 months.  She reports that she had trouble sleeping once she got home, bothered by the memories of other patients at the [facility]. She was prescribed Zoloft by her PCP but did not take it due to listed side effects on the package. She reports that sleep is a little better now, she usually gets 6 hours of sleep but wakes up several times to urinate due to the HCTZ she takes at bedtime. She rarely takes her brother's Ativan to sleep  (1-2/month per patient). She reports a history of migraines with her period when younger, but has been headache-free for 15 years until last March. She denies any family history of migraines.   Diagnostic Data: I personally reviewed her MRI brain with and without contrast with the patient, there is a moderate amount of chronic microvascular disease in the bilateral subcortical regions. There is a small 1.8 x 1.1 x 1.4 cm meningioma in the left temporal region with minimal mass effect, no edema. There is note of a prominent left PCA infundibulum extending off the left ICA versus small aneurysm, incompletely evaluated on this exam.  MRI brain 09/13/14 unchanged.  EMG/NCV report received, EMG done 10/03/2016:  Abnormal due to 1. Moderate sensorimotor polyneuropathy with mixed demyelinating and axonal features. Differential diagnosis include but not limited to glucose intolerance, paraproteinemia, vitamin deficiency, toxic exposures, endocrinopathy 2. Moderate bilateral median neuropathy at the wrist consistent with CTS, L>R 3. Multilevel lumbosacral radiculopathy, acute on chronic 4. Multilevel cervical radiculopathy, acute on chronic    MEDICATIONS:  Outpatient Encounter Medications as of 09/24/2017  Medication Sig  . acetaminophen (TYLENOL) 500 MG tablet Take 1 tablet (500 mg total) by mouth every 8 (eight) hours as needed for moderate pain.  [name] allopurinol (ZYLOPRIM) 300 MG tablet Take 300 mg by mouth daily.  . Calcium 600-200 MG-UNIT tablet Take 1 tablet by mouth daily.  . diclofenac sodium (VOLTAREN) 1 % GEL Apply 2 g topically 4 (four) times daily.  [name] ezetimibe (ZETIA) 10 MG tablet TAKE 1 TABLET BY MOUTH ONCE DAILY  . fenofibrate 160 MG tablet TAKE 1 TABLET BY MOUTH ONCE DAILY  . fluticasone (FLONASE) 50 MCG/ACT nasal spray Place 2 sprays into both nostrils daily.  . furosemide (LASIX) 40 MG tablet Take 1 tablet (40 mg total) by mouth daily.  [name] levothyroxine (SYNTHROID, LEVOTHROID) 50 MCG  tablet TAKE 1 TABLET BY MOUTH ONCE DAILY  . loratadine (CLARITIN) 10 MG tablet Take 1 tablet (10 mg total) by mouth daily.  . Omega-3 Fatty Acids (FISH OIL) 1000 MG CAPS Take by mouth.  [name] omeprazole (PRILOSEC) 20 MG capsule Take 20 mg by mouth daily.  [name] triamcinolone cream (KENALOG) 0.1 % Apply 1 application topically 2 (two) times daily. For two weeks as needed  .     No facility-administered encounter medications on file as of 09/24/2017.       Observations/Objective:   Exam limited due to nature of phone visit. Patient is awake, alert, able to answer questions without dysarthria.  Assessment and Plan:   This is a pleasant 87 yo RH woman with a history of hypertension, hyperlipidemia, melanoma, who presented with new onset daily headaches in March 2015. MRI brain had shown an incidental finding of a small meningioma over the left temporal region, stable in size with repeat imaging in 2016. She has had no further recurrence of similar headaches. Her concern on last visit was neuropathy, she has had side effects to gabapentin, Lyrica, and possibly nortritpyline (unclear if she started it on last  visit). Weighing benefits versus side effects, she opts to hold off on taking any medications for neuropathy at this time. She will follow-up prn and knows to call for any changes.    Follow Up Instructions:   -I discussed the assessment and treatment plan with the patient. The patient was provided an opportunity to ask questions and all were answered. The patient agreed with the plan and demonstrated an understanding of the instructions.   The patient was advised to call back or seek an in-person evaluation if the symptoms worsen or if the condition fails to improve as anticipated.    Total Time spent in visit with the patient was:  15:10 minutes, of which 100% of the time was spent in counseling and/or coordinating care on the above.   Pt understands and agrees with the plan of care outlined.      Cameron Sprang, MD

## 2019-03-16 ENCOUNTER — Ambulatory Visit (INDEPENDENT_AMBULATORY_CARE_PROVIDER_SITE_OTHER): Payer: Medicare Other

## 2019-03-16 DIAGNOSIS — R7989 Other specified abnormal findings of blood chemistry: Secondary | ICD-10-CM | POA: Diagnosis not present

## 2019-03-16 DIAGNOSIS — N289 Disorder of kidney and ureter, unspecified: Secondary | ICD-10-CM | POA: Diagnosis not present

## 2019-03-16 LAB — BASIC METABOLIC PANEL
BUN: 26 mg/dL — ABNORMAL HIGH (ref 6–23)
CO2: 30 mEq/L (ref 19–32)
Calcium: 9.3 mg/dL (ref 8.4–10.5)
Chloride: 102 mEq/L (ref 96–112)
Creatinine, Ser: 1.28 mg/dL — ABNORMAL HIGH (ref 0.40–1.20)
GFR: 39.69 mL/min — ABNORMAL LOW (ref >60.00)
Glucose, Bld: 73 mg/dL (ref 70–99)
Potassium: 5.3 mEq/L — ABNORMAL HIGH (ref 3.5–5.1)
Sodium: 138 mEq/L (ref 135–145)

## 2019-03-16 LAB — TSH: TSH: 3.6 u[IU]/mL (ref 0.35–4.50)

## 2019-03-22 ENCOUNTER — Other Ambulatory Visit: Payer: Self-pay | Admitting: General Practice

## 2019-03-22 MED ORDER — LEVOTHYROXINE SODIUM 88 MCG PO TABS: 88.0000 ug | ORAL_TABLET | Freq: Every day | ORAL | 1 refills | Status: DC

## 2019-04-02 ENCOUNTER — Telehealth: Payer: Self-pay | Admitting: Family Medicine

## 2019-04-02 NOTE — Progress Notes (Signed)
  Chronic Care Management   Note  04/02/2019 Name: Kristen Manning MRN: [id number] DOB: 07-17-1932  Kristen Manning is a 86 y.o. year old female who is a primary care patient of Kristen Manning, Kristen Millet, Kristen Manning. I reached out to Berenice Primas by phone today in response to a referral sent by Kristen Manning's PCP, Midge Minium, Kristen Manning.   Kristen Manning was given information about Chronic Care Management services today including:  1. CCM service includes personalized support from designated clinical staff supervised by her physician, including individualized plan of care and coordination with other care providers 2. 24/7 contact phone numbers for assistance for urgent and routine care needs. 3. Service will only be billed when office clinical staff spend 20 minutes or more in a month to coordinate care. 4. Only one practitioner may furnish and bill the service in a calendar month. 5. The patient may stop CCM services at any time (effective at the end of the month) by phone call to the office staff.   Patient agreed to services and verbal consent obtained.   Follow up plan:   Earney Hamburg [facility]

## 2019-04-05 ENCOUNTER — Ambulatory Visit: Payer: Medicare Other

## 2019-04-07 ENCOUNTER — Other Ambulatory Visit: Payer: Self-pay

## 2019-04-08 ENCOUNTER — Telehealth: Payer: Self-pay | Admitting: Family Medicine

## 2019-04-08 NOTE — Progress Notes (Signed)
  Chronic Care Management   Note  04/08/2019 Name: [name] MRN: [id number] DOB: [birth year]  Kristen Manning is a 86 y.o. year old female who is a primary care patient of Birdie Riddle, Aundra Millet, MD. I reached out to Berenice Primas by phone today in response to a referral sent by Ms. Wallene Dales Lantis's PCP, Midge Minium, MD.   Ms. Babauta was given information about Chronic Care Management services today including:  1. CCM service includes personalized support from designated clinical staff supervised by her physician, including individualized plan of care and coordination with other care providers 2. 24/7 contact phone numbers for assistance for urgent and routine care needs. 3. Service will only be billed when office clinical staff spend 20 minutes or more in a month to coordinate care. 4. Only one practitioner may furnish and bill the service in a calendar month. 5. The patient may stop CCM services at any time (effective at the end of the month) by phone call to the office staff.   Patient agreed to services and verbal consent obtained.   Follow up plan:   Earney Hamburg [facility]

## 2019-04-12 NOTE — Progress Notes (Signed)
Chronic Care Management Pharmacy  Name: Kristen Manning   MRN: [id number]  DOB: [birth year]  Chief Complaint/HPI Kristen Manning,  86 y.o. , female presents for their Initial CCM visit with the clinical pharmacist via telephone due to COVID-19 Pandemic.  PCP : Kristen Minium, MD  Their chronic conditions include: HTN, HLD and Hypothyroidism  Office Visits:  3/9: TSH normal; scr improved from 1.36 to 1.28; GFR ~40  2/2: TSH elevated, increased levothyroxine to 88 mcg; scr elevated, increased water intake recommended   Consult Visits:  3/3 Kristen Manning, [facility]): f/u on chronic left shoulder pain; cortisone injection. Received once 5 mo prior.  Outpatient Encounter Medications as of 04/13/2019  Medication Sig  . acetaminophen (TYLENOL) 500 MG tablet Take 1 tablet (500 mg total) by mouth every 8 (eight) hours as needed for moderate pain.  [name] allopurinol (ZYLOPRIM) 300 MG tablet Take 300 mg by mouth as needed.   [name] b complex vitamins tablet Take 1 tablet by mouth daily.  . Calcium 600-200 MG-UNIT tablet Take 1 tablet by mouth daily.  . Coenzyme Q10 100 MG capsule Take 100 mg by mouth daily.   [name] ezetimibe (ZETIA) 10 MG tablet Take 1 tablet (10 mg total) by mouth daily.  . fenofibrate 160 MG tablet Take 1 tablet (160 mg total) by mouth daily.  . fluticasone (FLONASE) 50 MCG/ACT nasal spray Place 2 sprays into both nostrils daily.  . furosemide (LASIX) 40 MG tablet Take by mouth daily.   [name] levothyroxine (SYNTHROID) 88 MCG tablet Take 1 tablet (88 mcg total) by mouth daily.  [name] loratadine (CLARITIN) 10 MG tablet Take 1 tablet (10 mg total) by mouth daily.  [name] MAXITROL 3.5-10000-0.1 OINT Apply 1 Application Both Eyes every evening  . NON FORMULARY Take 1 tablet by mouth daily.  [name] omeprazole (PRILOSEC) 20 MG capsule Take 20 mg by mouth daily.  . prednisoLONE acetate (PRED FORTE) 1 % ophthalmic suspension 1 drop every morning.  . triamcinolone (NASACORT) 55 MCG/ACT AERO nasal inhaler  Place 2 sprays into the nose daily. (Patient not taking: Reported on 04/13/2019)   No facility-administered encounter medications on file as of 04/13/2019.   Current Diagnosis/Assessment:  Goals Addressed            This Visit's Progress   . PharmD Care Plan       CARE PLAN ENTRY  Current Barriers:  . Chronic Disease Management support, education, and care coordination needs related to Hypothyroidism, HTN and HLD  Pharmacist Clinical Goal(s):  [name] Over the next 90 days, patient will demonstrate Improved medication adherence as evidenced by use of pharmacy pill packaging . BP <140/90 . TC <200 . TSH: 0.35-4.5  Interventions: . Comprehensive medication review performed.  Patient Self Care Activities:  . Patient verbalizes understanding of plan to call pharmacist with any questions.   Initial goal documentation       Hypertension  BP today is:  <130/80  Office blood pressures are  BP Readings from Last 3 Encounters:  02/09/19 136/80  10/26/18 130/90  10/07/18 (!) 170/80  Taking furosemide 40 mg daily. Patient checks BP at home 1-2x per week or when symptomatic. Patient home BP readings are ranging: 120s/70s on average. Denies dizziness, SOB, chest pain. We discussed diet and exercise extensively   Plan Continue current medications    Hyperlipidemia   Lipid Panel     Component Value Date/Time   CHOL 193 02/10/2019 1022   TRIG 109.0 02/10/2019 1022   HDL  58.50 02/10/2019 1022   CHOLHDL 3 02/10/2019 1022   VLDL 21.8 02/10/2019 1022   LDLCALC 112 (H) 02/10/2019 1022   LDLDIRECT 111.0 10/04/2015 1031    The ASCVD Risk score (Goff DC Jr., et al., 2013) failed to calculate for the following reasons:   The 2013 ASCVD risk score is only valid for ages 54 to 19   Patient is currently controlled on the following medications: fenofibrate 160 mg daily, Zetia 10 mg daily. Patient is intolerant to statins. Denies any side effects, including muscle pain, with current regimen.     Plan Continue current medications   Hypothyroidism   TSH  Date Value Ref Range Status  03/16/2019 3.60 0.35 - 4.50 uIU/mL Final    Patient is currently controlled on the following medications: levothyroxine 88 mg. TSH within normal range after increasing levothyroxine dose at last OV. Says she is feeling much better after taking. Patient counseled, understands proper use of medication.    Plan Continue current medications   Gout   Lab Results  Component Value Date   LABURIC 6.6 08/01/2017   Taking allopurinol 300 mg 2-3 times per week as needed. States not having gout attack in over 2 yrs so has not needed medication. Last uric acid on file from 2019 was 6.6. Mentions that the bottle is from 03/2018 and has not used since filling.   Plan Continue current medications. Will discuss need for therapy with Dr. Birdie Manning.   GERD   Is currently well controlled on omeprazole 20 mg daily.  No complaint of acid reflux symptoms over the last month.   Plan Continue current medications  Medication Management  We discussed issues concerning the management of medications and adherence. Organization of medications and coordination with current pharmacy has been difficult for patient.   Plan Utilize [facility] for medication synchronization, packaging and delivery ______________ Visit Information Kristen Manning was given information about Chronic Care Management services today including:  1. CCM service includes personalized support from designated clinical staff supervised by her physician, including individualized plan of care and coordination with other care providers 2. 24/7 contact phone numbers for assistance for urgent and routine care needs. 3. Standard insurance, coinsurance, copays and deductibles apply for chronic care management only during months in which we provide at least 20 minutes of these services. Most insurances cover these services at 100%, however patients may be  responsible for any copay, coinsurance and/or deductible if applicable. This service may help you avoid the need for more expensive face-to-face services. 4. Only one practitioner may furnish and bill the service in a calendar month. 5. The patient may stop CCM services at any time (effective at the end of the month) by phone call to the office staff.  Patient agreed to services and verbal consent obtained.   Verbal consent obtained for [facility] enhanced pharmacy services (medication synchronization, adherence packaging, delivery coordination). A medication sync plan was created to allow patient to get all medications delivered once every 30 to 90 days per patient preference. Patient understands they have freedom to choose pharmacy and clinical pharmacist will coordinate care between all prescribers and [facility].  Madelin Rear, Pharm.D. Clinical Pharmacist [facility] [phone]

## 2019-04-13 ENCOUNTER — Other Ambulatory Visit: Payer: Self-pay | Admitting: General Practice

## 2019-04-13 ENCOUNTER — Ambulatory Visit: Payer: Medicare Other

## 2019-04-13 DIAGNOSIS — I10 Essential (primary) hypertension: Secondary | ICD-10-CM

## 2019-04-13 DIAGNOSIS — E038 Other specified hypothyroidism: Secondary | ICD-10-CM

## 2019-04-13 DIAGNOSIS — E782 Mixed hyperlipidemia: Secondary | ICD-10-CM

## 2019-04-13 MED ORDER — FENOFIBRATE 160 MG PO TABS: 160.0000 mg | ORAL_TABLET | Freq: Every day | ORAL | 1 refills | Status: DC

## 2019-04-13 MED ORDER — EZETIMIBE 10 MG PO TABS: 10.0000 mg | ORAL_TABLET | Freq: Every day | ORAL | 1 refills | Status: DC

## 2019-04-13 MED ORDER — LEVOTHYROXINE SODIUM 88 MCG PO TABS: 88.0000 ug | ORAL_TABLET | Freq: Every day | ORAL | 1 refills | Status: DC

## 2019-04-13 NOTE — Patient Instructions (Addendum)
Visit Information  Goals Addressed            This Visit's Progress   . PharmD Care Plan       CARE PLAN ENTRY  Current Barriers:  . Chronic Disease Management support, education, and care coordination needs related to Hypothyroidism, HTN and HLD  Pharmacist Clinical Goal(s):  [name] Over the next 90 days, patient will demonstrate Improved medication adherence as evidenced by use of pharmacy pill packaging . BP <140/90 . TC <200 . TSH: 0.35-4.5  Interventions: . Comprehensive medication review performed.  Patient Self Care Activities:  . Patient verbalizes understanding of plan to call pharmacist with any questions.   Initial goal documentation       Kristen Manning was given information about Chronic Care Management services today including:  1. CCM service includes personalized support from designated clinical staff supervised by her physician, including individualized plan of care and coordination with other care providers 2. 24/7 contact phone numbers for assistance for urgent and routine care needs. 3. Standard insurance, coinsurance, copays and deductibles apply for chronic care management only during months in which we provide at least 20 minutes of these services. Most insurances cover these services at 100%, however patients may be responsible for any copay, coinsurance and/or deductible if applicable. This service may help you avoid the need for more expensive face-to-face services. 4. Only one practitioner may furnish and bill the service in a calendar month. 5. The patient may stop CCM services at any time (effective at the end of the month) by phone call to the office staff.  Patient agreed to services and verbal consent obtained.   The patient verbalized understanding of instructions provided today and agreed to receive a mailed copy of patient instruction and/or educational materials. Telephone follow up appointment with pharmacy team member scheduled for: 7/9  Madelin Rear, [place].D. Clinical Pharmacist [facility] at [facility] [phone]   High Cholesterol  High cholesterol is a condition in which the blood has high levels of a white, waxy, fat-like substance (cholesterol). The human body needs small amounts of cholesterol. The liver makes all the cholesterol that the body needs. Extra (excess) cholesterol comes from the food that we eat. Cholesterol is carried from the liver by the blood through the blood vessels. If you have high cholesterol, deposits (plaques) may build up on the walls of your blood vessels (arteries). Plaques make the arteries narrower and stiffer. Cholesterol plaques increase your risk for heart attack and stroke. Work with your health care provider to keep your cholesterol levels in a healthy range. What increases the risk? This condition is more likely to develop in people who:  Eat foods that are high in animal fat (saturated fat) or cholesterol.  Are overweight.  Are not getting enough exercise.  Have a family history of high cholesterol. What are the signs or symptoms? There are no symptoms of this condition. How is this diagnosed? This condition may be diagnosed from the results of a blood test.  If you are older than age 57, your health care provider may check your cholesterol every 4-6 years.  You may be checked more often if you already have high cholesterol or other risk factors for heart disease. The blood test for cholesterol measures:  "Bad" cholesterol (LDL cholesterol). This is the main type of cholesterol that causes heart disease. The desired level for LDL is less than 100.  "Good" cholesterol (HDL cholesterol). This type helps to protect against heart  disease by cleaning the arteries and carrying the LDL away. The desired level for HDL is 60 or higher.  Triglycerides. These are fats that the body can store or burn for energy. The desired number for triglycerides is lower than  150.  Total cholesterol. This is a measure of the total amount of cholesterol in your blood, including LDL cholesterol, HDL cholesterol, and triglycerides. A healthy number is less than 200. How is this treated? This condition is treated with diet changes, lifestyle changes, and medicines. Diet changes  This may include eating more whole grains, fruits, vegetables, nuts, and fish.  This may also include cutting back on red meat and foods that have a lot of added sugar. Lifestyle changes  Changes may include getting at least 40 minutes of aerobic exercise 3 times a week. Aerobic exercises include walking, biking, and swimming. Aerobic exercise along with a healthy diet can help you maintain a healthy weight.  Changes may also include quitting smoking. Medicines  Medicines are usually given if diet and lifestyle changes have failed to reduce your cholesterol to healthy levels.  Your health care provider may prescribe a statin medicine. Statin medicines have been shown to reduce cholesterol, which can reduce the risk of heart disease. Follow these instructions at home: Eating and drinking If told by your health care provider:  Eat chicken (without skin), fish, veal, shellfish, ground [place] breast, and round or loin cuts of red meat.  Do not eat fried foods or fatty meats, such as hot dogs and salami.  Eat plenty of fruits, such as apples.  Eat plenty of vegetables, such as broccoli, potatoes, and carrots.  Eat beans, peas, and lentils.  Eat grains such as barley, rice, couscous, and bulgur wheat.  Eat pasta without cream sauces.  Use skim or nonfat milk, and eat low-fat or nonfat yogurt and cheeses.  Do not eat or drink whole milk, cream, ice cream, egg yolks, or hard cheeses.  Do not eat stick margarine or tub margarines that contain trans fats (also called partially hydrogenated oils).  Do not eat saturated tropical oils, such as coconut oil and palm oil.  Do not eat  cakes, cookies, crackers, or other baked goods that contain trans fats.  General instructions  Exercise as directed by your health care provider. Increase your activity level with activities such as gardening, walking, and taking the stairs.  Take over-the-counter and prescription medicines only as told by your health care provider.  Do not use any products that contain nicotine or tobacco, such as cigarettes and e-cigarettes. If you need help quitting, ask your health care provider.  Keep all follow-up visits as told by your health care provider. This is important. Contact a health care provider if:  You are struggling to maintain a healthy diet or weight.  You need help to start on an exercise program.  You need help to stop smoking. Get help right away if:  You have chest pain.  You have trouble breathing. This information is not intended to replace advice given to you by your health care provider. Make sure you discuss any questions you have with your health care provider. Document Revised: 12/27/2016 Document Reviewed: 06/24/2015 Elsevier Patient Education  [place].

## 2019-04-13 NOTE — Addendum Note (Signed)
Addended by: Desmond Dike L on: 04/13/2019 03:00 PM   Modules accepted: Orders

## 2019-04-15 ENCOUNTER — Telehealth: Payer: Self-pay | Admitting: Cardiovascular Disease

## 2019-04-15 MED ORDER — FUROSEMIDE 40 MG PO TABS: 40.0000 mg | ORAL_TABLET | Freq: Every day | ORAL | 0 refills | Status: DC

## 2019-04-15 NOTE — Telephone Encounter (Addendum)
RX for Lasix sent to pharmacy per pt request.. pt needs to make an appt with Dr. Gwenlyn Found.[name] last appt 03/2018.

## 2019-04-15 NOTE — Telephone Encounter (Signed)
New message   Patient needs a new prescription of furosemide (LASIX) 40 MG tablet sent to [place], [place] - [address] 90 day supply

## 2019-04-30 ENCOUNTER — Telehealth: Payer: Self-pay

## 2019-04-30 NOTE — Telephone Encounter (Signed)
Patient has called in this afternoon stating that she has tried for several days to reach [facility].  I pulled the number up in epic.  Patient confirmed that the number we have on file is the number she has been trying to call.  I called the number and got straight through to the pharmacy.  Patient was still very anxious and  would like to switch pharmacies.  Please follow up with patient in regard.

## 2019-05-03 ENCOUNTER — Encounter: Payer: Self-pay | Admitting: Family Medicine

## 2019-05-03 DIAGNOSIS — H35363 Drusen (degenerative) of macula, bilateral: Secondary | ICD-10-CM | POA: Diagnosis not present

## 2019-05-03 DIAGNOSIS — H524 Presbyopia: Secondary | ICD-10-CM | POA: Diagnosis not present

## 2019-05-03 DIAGNOSIS — H01023 Squamous blepharitis right eye, unspecified eyelid: Secondary | ICD-10-CM | POA: Diagnosis not present

## 2019-05-03 DIAGNOSIS — Z961 Presence of intraocular lens: Secondary | ICD-10-CM | POA: Diagnosis not present

## 2019-05-03 DIAGNOSIS — H01026 Squamous blepharitis left eye, unspecified eyelid: Secondary | ICD-10-CM | POA: Diagnosis not present

## 2019-05-03 DIAGNOSIS — H04121 Dry eye syndrome of right lacrimal gland: Secondary | ICD-10-CM | POA: Diagnosis not present

## 2019-05-03 NOTE — Telephone Encounter (Signed)
Spoke with patient, everything has been clarified concerning her medications. Received 1st order through [facility] last Friday, and has scheduled deliveries for remaining medications. Provided direct phone number [phone] to patient if she has any additional concerns.

## 2019-05-03 NOTE — Telephone Encounter (Signed)
Patient returning  Missed call .

## 2019-05-03 NOTE — Telephone Encounter (Signed)
Left message for return call.

## 2019-06-11 ENCOUNTER — Other Ambulatory Visit: Payer: Self-pay | Admitting: Cardiovascular Disease

## 2019-06-11 MED ORDER — FUROSEMIDE 40 MG PO TABS: 40.0000 mg | ORAL_TABLET | Freq: Every day | ORAL | 0 refills | Status: DC

## 2019-06-11 NOTE — Telephone Encounter (Signed)
*  STAT* If patient is at the pharmacy, call can be transferred to refill team.   1. Which medications need to be refilled? (please list name of each medication and dose if known)  furosemide (LASIX) 40 MG tablet  2. Which pharmacy/location (including street and city if local pharmacy) is medication to be sent to? [facility] - [place], [place] - [address]   3. Do they need a 30 day or 90 day supply?   90 day supply  

## 2019-06-16 DIAGNOSIS — M7542 Impingement syndrome of left shoulder: Secondary | ICD-10-CM | POA: Diagnosis not present

## 2019-06-16 DIAGNOSIS — M19012 Primary osteoarthritis, left shoulder: Secondary | ICD-10-CM | POA: Diagnosis not present

## 2019-06-30 ENCOUNTER — Ambulatory Visit: Payer: Medicare Other | Admitting: Cardiovascular Disease

## 2019-07-05 ENCOUNTER — Telehealth: Payer: Self-pay | Admitting: Family Medicine

## 2019-07-05 NOTE — Telephone Encounter (Signed)
Left message for patient to schedule Annual Wellness Visit.  Please schedule with Nurse Health Advisor Martha Stanley, RN at [place]  

## 2019-07-09 ENCOUNTER — Ambulatory Visit: Payer: Medicare Other | Admitting: Cardiovascular Disease

## 2019-07-14 DIAGNOSIS — I8312 Varicose veins of left lower extremity with inflammation: Secondary | ICD-10-CM | POA: Diagnosis not present

## 2019-07-14 DIAGNOSIS — D2339 Other benign neoplasm of skin of other parts of face: Secondary | ICD-10-CM | POA: Diagnosis not present

## 2019-07-14 DIAGNOSIS — D485 Neoplasm of uncertain behavior of skin: Secondary | ICD-10-CM | POA: Diagnosis not present

## 2019-07-14 DIAGNOSIS — Z85828 Personal history of other malignant neoplasm of skin: Secondary | ICD-10-CM | POA: Diagnosis not present

## 2019-07-14 DIAGNOSIS — I8311 Varicose veins of right lower extremity with inflammation: Secondary | ICD-10-CM | POA: Diagnosis not present

## 2019-07-14 DIAGNOSIS — Z85820 Personal history of malignant melanoma of skin: Secondary | ICD-10-CM | POA: Diagnosis not present

## 2019-07-14 DIAGNOSIS — I872 Venous insufficiency (chronic) (peripheral): Secondary | ICD-10-CM | POA: Diagnosis not present

## 2019-07-14 NOTE — Progress Notes (Deleted)
Chronic Care Management Pharmacy  Name: Kristen Manning   MRN: [id number]  DOB: Mar 27, 1932  Chief Complaint/HPI Kristen Manning,  86 y.o. , female presents for their Initial CCM visit with the clinical pharmacist via telephone due to COVID-19 Pandemic.  Dr. Gwenlyn Manning f/u  PCP : Kristen Minium, MD  Their chronic conditions include: HTN, HLD and Hypothyroidism  Office Visits: 03/16/2019 (PCP): TSH normal; scr improved from 1.36 to 1.28; GFR ~40 02/09/2019 (PCP): TSH elevated, increased levothyroxine to 88 mcg; scr elevated, increased water intake recommended   Consult Visits: 06/16/2019 Kristen Manning, [facility]): f/u on chronic left shoulder pain; cortisone injection. 04/15/2019 (Dr. Gwenlyn Manning, Cardiology): Pt requested lasix, needs to schedule f/u with his office. 03/10/2019 Kristen Manning, [facility]): f/u on chronic left shoulder pain; cortisone injection. Received once 5 mo prior.  Outpatient Encounter Medications as of 07/16/2019  Medication Sig  . acetaminophen (TYLENOL) 500 MG tablet Take 1 tablet (500 mg total) by mouth every 8 (eight) hours as needed for moderate pain.  [name] allopurinol (ZYLOPRIM) 300 MG tablet Take 300 mg by mouth as needed.   [name] b complex vitamins tablet Take 1 tablet by mouth daily.  . Calcium 600-200 MG-UNIT tablet Take 1 tablet by mouth daily.  . Coenzyme Q10 100 MG capsule Take 100 mg by mouth daily.   [name] ezetimibe (ZETIA) 10 MG tablet Take 1 tablet (10 mg total) by mouth daily.  . fenofibrate 160 MG tablet Take 1 tablet (160 mg total) by mouth daily.  . fluticasone (FLONASE) 50 MCG/ACT nasal spray Place 2 sprays into both nostrils daily.  . furosemide (LASIX) 40 MG tablet Take 1 tablet (40 mg total) by mouth daily. NEED OV.  . levothyroxine (SYNTHROID) 88 MCG tablet Take 1 tablet (88 mcg total) by mouth daily.  [name] loratadine (CLARITIN) 10 MG tablet Take 1 tablet (10 mg total) by mouth daily.  [name] MAXITROL 3.5-10000-0.1 OINT Apply 1 Application Both Eyes every evening  . NON  FORMULARY Take 1 tablet by mouth daily.  [name] omeprazole (PRILOSEC) 20 MG capsule Take 20 mg by mouth daily.  . prednisoLONE acetate (PRED FORTE) 1 % ophthalmic suspension 1 drop every morning.  . triamcinolone (NASACORT) 55 MCG/ACT AERO nasal inhaler Place 2 sprays into the nose daily. (Patient not taking: Reported on 04/13/2019)   No facility-administered encounter medications on file as of 07/16/2019.   Current Diagnosis/Assessment:  Goals Addressed   None    Hypertension  BP today is:  <130/80  Office blood pressures are  BP Readings from Last 3 Encounters:  02/09/19 136/80  10/26/18 130/90  10/07/18 (!) 170/80  Taking furosemide 40 mg daily. Patient checks BP at home 1-2x per week or when symptomatic. Patient home BP readings are ranging: 120s/70s on average. Denies dizziness, SOB, chest pain. We discussed diet and exercise extensively   Plan Continue current medications    Hyperlipidemia   Lipid Panel     Component Value Date/Time   CHOL 193 02/10/2019 1022   TRIG 109.0 02/10/2019 1022   HDL 58.50 02/10/2019 1022   CHOLHDL 3 02/10/2019 1022   VLDL 21.8 02/10/2019 1022   LDLCALC 112 (H) 02/10/2019 1022   LDLDIRECT 111.0 10/04/2015 1031    The ASCVD Risk score (Goff DC Jr., et al., 2013) failed to calculate for the following reasons:   The 2013 ASCVD risk score is only valid for ages 95 to 39   Patient is currently controlled on the following medications: fenofibrate 160 mg daily, Zetia  10 mg daily. Patient is intolerant to statins. Denies any side effects, including muscle pain, with current regimen.   Plan Continue current medications   Hypothyroidism   TSH  Date Value Ref Range Status  03/16/2019 3.60 0.35 - 4.50 uIU/mL Final    Patient is currently controlled on the following medications: levothyroxine 88 mg. TSH within normal range after increasing levothyroxine dose at last OV. Says she is feeling much better after taking. Patient counseled, understands proper  use of medication.    Plan Continue current medications   Gout   Lab Results  Component Value Date   LABURIC 6.6 08/01/2017   Taking allopurinol 300 mg 2-3 times per week as needed. States not having gout attack in over 2 yrs so has not needed medication. Last uric acid on file from 2019 was 6.6. Mentions that the bottle is from 03/2018 and has not used since filling.   Plan Continue current medications. Will discuss need for therapy with Dr. Birdie Manning.   GERD   Is currently well controlled on omeprazole 20 mg daily.  No complaint of acid reflux symptoms over the last month.   Plan Continue current medications  Medication Management  We discussed issues concerning the management of medications and adherence. Organization of medications and coordination with current pharmacy has been difficult for patient.   Plan Utilize [facility] for medication synchronization, packaging and delivery ______________ Visit Information Ms. Hoopes was given information about Chronic Care Management services today including:  1. CCM service includes personalized support from designated clinical staff supervised by her physician, including individualized plan of care and coordination with other care providers 2. 24/7 contact phone numbers for assistance for urgent and routine care needs. 3. Standard insurance, coinsurance, copays and deductibles apply for chronic care management only during months in which we provide at least 20 minutes of these services. Most insurances cover these services at 100%, however patients may be responsible for any copay, coinsurance and/or deductible if applicable. This service may help you avoid the need for more expensive face-to-face services. 4. Only one practitioner may furnish and bill the service in a calendar month. 5. The patient may stop CCM services at any time (effective at the end of the month) by phone call to the office staff.  Patient agreed to services  and verbal consent obtained.   Verbal consent obtained for [facility] enhanced pharmacy services (medication synchronization, adherence packaging, delivery coordination). A medication sync plan was created to allow patient to get all medications delivered once every 30 to 90 days per patient preference. Patient understands they have freedom to choose pharmacy and clinical pharmacist will coordinate care between all prescribers and [facility].  Madelin Rear, Pharm.D. Clinical Pharmacist [facility] at [facility] [phone]

## 2019-07-16 ENCOUNTER — Telehealth: Payer: Medicare Other

## 2019-08-23 ENCOUNTER — Telehealth: Payer: Self-pay | Admitting: Family Medicine

## 2019-08-23 NOTE — Telephone Encounter (Signed)
Pt called in stating that she is having vomiting, headache, and sore throat. She states no fever. I offered to transfer her to the triage line but she refused that. She changed her phy tomorrow to a VV visit.

## 2019-08-23 NOTE — Telephone Encounter (Signed)
Called and spoke with pt. She is having sore throat x 2 days, fatigue, cough at times, HA, has vomited once today, she is going to call to try to get COVID testing today. Had J&J vaccine in April.

## 2019-08-23 NOTE — Telephone Encounter (Signed)
Pt needs to get tested for COVID as I don't have any record of immunization.  If she is COVID +, we can arrange infusion to hopefully improve sxs and prevent severe illness

## 2019-08-23 NOTE — Telephone Encounter (Signed)
fyi

## 2019-08-24 ENCOUNTER — Telehealth (INDEPENDENT_AMBULATORY_CARE_PROVIDER_SITE_OTHER): Payer: Medicare Other | Admitting: Family Medicine

## 2019-08-24 ENCOUNTER — Encounter: Payer: Self-pay | Admitting: Family Medicine

## 2019-08-24 ENCOUNTER — Other Ambulatory Visit: Payer: Self-pay

## 2019-08-24 DIAGNOSIS — B9689 Other specified bacterial agents as the cause of diseases classified elsewhere: Secondary | ICD-10-CM

## 2019-08-24 DIAGNOSIS — J329 Chronic sinusitis, unspecified: Secondary | ICD-10-CM | POA: Diagnosis not present

## 2019-08-24 MED ORDER — AMOXICILLIN 875 MG PO TABS
875.0000 mg | ORAL_TABLET | Freq: Two times a day (BID) | ORAL | 0 refills | Status: DC
Start: 2019-08-24 — End: 2020-03-20

## 2019-08-24 NOTE — Progress Notes (Signed)
I have discussed the procedure for the virtual visit with the patient who has given consent to proceed with assessment and treatment.   Pt unable to obtain vitals.   Ellieanna Funderburg L Arilyn Brierley, CMA     

## 2019-08-24 NOTE — Progress Notes (Signed)
Virtual Visit via Video   I connected with patient on 08/24/19 at 10:00 AM EDT by a video enabled telemedicine application and verified that I am speaking with the correct person using two identifiers.  Location patient: Home Location provider: Acupuncturist, Office Persons participating in the virtual visit: Patient, Provider, [place] (Jess B)  I discussed the limitations of evaluation and management by telemedicine and the availability of in person appointments. The patient expressed understanding and agreed to proceed.  Interactive audio and video telecommunications were attempted between this provider and patient, however failed, due to patient having technical difficulties OR patient did not have access to video capability.  We continued and completed visit with audio only.   Subjective:   HPI:   'sinus stuff'- + fatigue, nasal drainage, sore throat, vomited x1  + body aches.  'I just don't feel good'.  sxs started Saturday (3 days ago)  Yesterday had 'awful headache'.  Was not able to find a testing appt yesterday.  Reports feeling somewhat better today than yesterday after resting last night.  Pt masks regularly and she is vaccinated.  She only goes to medical appts and grocery store.  No fever.  Having L sided facial pain.  Able to eat and drink.  No SOB above baseline.  Pt reports this feels similar to previous sinus infxns.  ROS:   See pertinent positives and negatives per HPI.  Patient Active Problem List   Diagnosis Date Noted  . Chronic left shoulder pain 03/10/2019  . Morbid obesity ([place]) 02/11/2018  . DDD (degenerative disc disease), lumbar 10/07/2016  . Primary osteoarthritis of both hands 10/07/2016  . Primary osteoarthritis of both feet 10/07/2016  . Tension-type headache, not intractable 09/16/2016  . GERD (gastroesophageal reflux disease) 09/11/2016  . ANA positive 09/02/2016  . History of total knee replacement, bilateral 03/20/2015  . Hypothyroidism  09/21/2014  . Gout 06/20/2014  . Lower leg edema 06/13/2014  . Chest wall pain 04/29/2014  . Meningioma ([place]) 10/07/2013  . Chronic tension-type headache, intractable 10/07/2013  . Depression 06/23/2013  . Insomnia 05/26/2013  . Routine general medical examination at a health care facility 10/17/2011  . HTN (hypertension) 07/03/2011  . Hyperlipidemia 07/03/2011  . Venous insufficiency, peripheral 07/03/2011    Social History   Tobacco Use  . Smoking status: Never Smoker  . Smokeless tobacco: Never Used  Substance Use Topics  . Alcohol use: No    Current Outpatient Medications:  .  acetaminophen (TYLENOL) 500 MG tablet, Take 1 tablet (500 mg total) by mouth every 8 (eight) hours as needed for moderate pain., Disp: 90 tablet, Rfl: 1 .  allopurinol (ZYLOPRIM) 300 MG tablet, Take 300 mg by mouth as needed. , Disp: , Rfl: 0 .  b complex vitamins tablet, Take 1 tablet by mouth daily., Disp: , Rfl:  .  Calcium 600-200 MG-UNIT tablet, Take 1 tablet by mouth daily., Disp: , Rfl:  .  Coenzyme Q10 100 MG capsule, Take 100 mg by mouth daily. , Disp: , Rfl:  .  ezetimibe (ZETIA) 10 MG tablet, Take 1 tablet (10 mg total) by mouth daily., Disp: 90 tablet, Rfl: 1 .  fenofibrate 160 MG tablet, Take 1 tablet (160 mg total) by mouth daily., Disp: 90 tablet, Rfl: 1 .  fluticasone (FLONASE) 50 MCG/ACT nasal spray, Place 2 sprays into both nostrils daily., Disp: , Rfl:  .  furosemide (LASIX) 40 MG tablet, Take 1 tablet (40 mg total) by mouth daily. NEED OV., Disp: 90  tablet, Rfl: 0 .  levothyroxine (SYNTHROID) 88 MCG tablet, Take 1 tablet (88 mcg total) by mouth daily., Disp: 90 tablet, Rfl: 1 .  loratadine (CLARITIN) 10 MG tablet, Take 1 tablet (10 mg total) by mouth daily., Disp: , Rfl:  .  MAXITROL 3.5-10000-0.1 OINT, Apply 1 Application Both Eyes every evening, Disp: , Rfl:  .  NON FORMULARY, Take 1 tablet by mouth daily., Disp: , Rfl:  .  omeprazole (PRILOSEC) 20 MG capsule, Take 20 mg by mouth  daily., Disp: , Rfl:  .  prednisoLONE acetate (PRED FORTE) 1 % ophthalmic suspension, 1 drop every morning., Disp: , Rfl:  .  triamcinolone (NASACORT) 55 MCG/ACT AERO nasal inhaler, Place 2 sprays into the nose daily., Disp: 1 Inhaler, Rfl: 12  Allergies  Allergen Reactions  . Aspirin Other (See Comments)    Noted caused 2 holes in retina, not sure   . Codeine Hives  . Statins     Intolerance to statins  . Tizanidine Hcl Other (See Comments)    Confusion    Objective:   There were no vitals taken for this visit.  Pt is able to speak clearly, coherently without shortness of breath or increased work of breathing. Thought process is linear.  Mood is appropriate.   Assessment and Plan:   Bacterial Sinusitis- pt has hx of similar and feels this is the same as previous infxns.  Did discuss possibility of COVID but pt has very limited interactions, masks at all times, and is vaccinated.  Start Amoxicillin for presumed bacterial sinusitis but if no improvement w/ meds she will need to get COVID testing.   Annye Asa, MD 08/24/2019  Time spent with the patient: 12 minutes, of which >50% was spent in obtaining information about symptoms, reviewing previous labs, evaluations, and treatments, counseling about condition (please see the discussed topics above), and developing a plan to further investigate it; had a number of questions which I addressed.

## 2019-08-27 ENCOUNTER — Encounter: Payer: Self-pay | Admitting: Cardiovascular Disease

## 2019-08-27 ENCOUNTER — Other Ambulatory Visit: Payer: Self-pay

## 2019-08-27 ENCOUNTER — Ambulatory Visit (INDEPENDENT_AMBULATORY_CARE_PROVIDER_SITE_OTHER): Payer: Medicare Other | Admitting: Cardiovascular Disease

## 2019-08-27 VITALS — BP 138/70 | HR 93 | Ht 64.0 in | Wt 219.2 lb

## 2019-08-27 DIAGNOSIS — E782 Mixed hyperlipidemia: Secondary | ICD-10-CM | POA: Diagnosis not present

## 2019-08-27 DIAGNOSIS — I10 Essential (primary) hypertension: Secondary | ICD-10-CM

## 2019-08-27 DIAGNOSIS — R600 Localized edema: Secondary | ICD-10-CM | POA: Diagnosis not present

## 2019-08-27 DIAGNOSIS — I872 Venous insufficiency (chronic) (peripheral): Secondary | ICD-10-CM | POA: Diagnosis not present

## 2019-08-27 NOTE — Assessment & Plan Note (Signed)
History of hyperlipidemia intolerant to statin therapy on Zetia and fenofibrate with lipid profile performed 02/10/2019 revealing total cholesterol of 193, LDL of 112 and HDL of 58.

## 2019-08-27 NOTE — Assessment & Plan Note (Signed)
History of venous insufficiency/reflux by duplex ultrasound with 2-3+ tense pitting bilateral lower extremity edema with venous stasis changes and erythema on diuretics.

## 2019-08-27 NOTE — Patient Instructions (Signed)

## 2019-08-27 NOTE — Progress Notes (Signed)
08/27/2019 Kristen Manning   Jul 10, 1932  [id number]  Primary Physician Midge Minium, MD Primary Cardiologist: Lorretta Harp MD [facility], [place], [place]  HPI:  Kristen Manning is a 86 y.o.  moderate to severely overweight widowed Caucasian female mother of 69, grandmother of her grandchildren referred by Dr. Birdie Riddle for cardiovascular evaluation. I last saw her in the office  03/25/2018.Her brother [name] is also a patient here. Has a history of treated hypertension and hyperlipidemia. She has had bilateral total knee replacements and has gout She received pneumonia and flu vaccine in the fall of last year. Should she suddenly developed pneumonia and the flow. She has complained of increasing lower extremity edema and dyspnea on exertion with occasional chest pain. I doubled her Lasix to twice a day which resulted in marked improvement in her edema.Since I saw her a year ago she's remained clinically stable. She still has chronic bilateral lower extremity edema on oral diuretics. A venous reflux study apparently was positive for venous reflux.   She was seen in the emergency room on 03/10/2018 with an episode of chest pain.  Her EKG showed no acute changes.  She ruled out for myocardial infarction.  Chest CT did show a mediastinal mass and she was referred to Dr. Darcey Nora for further evaluation.  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 Darcey Nora is following on it ongoing basis but does not think it is large enough to require surgery at this time.  Since I saw her a year and a half ago she is remained stable.  She still has significant bilateral lower extreme edema probably related to combination of diastolic dysfunction and venous insufficiency.  She pretty much stays close to her home and goes out minimally.  She denies chest pain or shortness of breath.   Current Meds  Medication Sig  . acetaminophen (TYLENOL) 500 MG tablet Take 1 tablet (500 mg total) by mouth every 8  (eight) hours as needed for moderate pain.  [name] allopurinol (ZYLOPRIM) 300 MG tablet Take 300 mg by mouth as needed.   [name] amoxicillin (AMOXIL) 875 MG tablet Take 1 tablet (875 mg total) by mouth 2 (two) times daily.  [name] b complex vitamins tablet Take 1 tablet by mouth daily.  . Calcium 600-200 MG-UNIT tablet Take 1 tablet by mouth daily.  . Coenzyme Q10 100 MG capsule Take 100 mg by mouth daily.   [name] ezetimibe (ZETIA) 10 MG tablet Take 1 tablet (10 mg total) by mouth daily.  . fenofibrate 160 MG tablet Take 1 tablet (160 mg total) by mouth daily.  . fluticasone (FLONASE) 50 MCG/ACT nasal spray Place 2 sprays into both nostrils daily.  . furosemide (LASIX) 40 MG tablet Take 1 tablet (40 mg total) by mouth daily. NEED OV.  . levothyroxine (SYNTHROID) 88 MCG tablet Take 1 tablet (88 mcg total) by mouth daily.  [name] loratadine (CLARITIN) 10 MG tablet Take 1 tablet (10 mg total) by mouth daily.  [name] MAXITROL 3.5-10000-0.1 OINT Apply 1 Application Both Eyes every evening  . NON FORMULARY Take 1 tablet by mouth daily.  [name] omeprazole (PRILOSEC) 20 MG capsule Take 20 mg by mouth daily.  . prednisoLONE acetate (PRED FORTE) 1 % ophthalmic suspension 1 drop every morning.  . triamcinolone (NASACORT) 55 MCG/ACT AERO nasal inhaler Place 2 sprays into the nose daily.     Allergies  Allergen Reactions  . Aspirin Other (See Comments)    Noted caused  2 holes in retina, not sure   . Codeine Hives  . Statins     Intolerance to statins  . Tizanidine Hcl Other (See Comments)    Confusion    Social History   Socioeconomic History  . Marital status: Widowed    Spouse name: Not on file  . Number of children: 4  . Years of education: Not on file  . Highest education level: Not on file  Occupational History  . Not on file  Tobacco Use  . Smoking status: Never Smoker  . Smokeless tobacco: Never Used  Vaping Use  . Vaping Use: Never used  Substance and Sexual Activity  . Alcohol use: No  . Drug use: No  .  Sexual activity: Not on file  Other Topics Concern  . Not on file  Social History Narrative   Lives with brother    Right handed    Social Determinants of Health   Financial Resource Strain:   . Difficulty of Paying Living Expenses: Not on file  Food Insecurity:   . Worried About Charity fundraiser in the Last Year: Not on file  . Ran Out of Food in the Last Year: Not on file  Transportation Needs: No Transportation Needs  . Lack of Transportation (Medical): No  . Lack of Transportation (Non-Medical): No  Physical Activity:   . Days of Exercise per Week: Not on file  . Minutes of Exercise per Session: Not on file  Stress:   . Feeling of Stress : Not on file  Social Connections:   . Frequency of Communication with Friends and Family: Not on file  . Frequency of Social Gatherings with Friends and Family: Not on file  . Attends Religious Services: Not on file  . Active Member of Clubs or Organizations: Not on file  . Attends Archivist Meetings: Not on file  . Marital Status: Not on file  Intimate Partner Violence:   . Fear of Current or Ex-Partner: Not on file  . Emotionally Abused: Not on file  . Physically Abused: Not on file  . Sexually Abused: Not on file     Review of Systems: General: negative for chills, fever, night sweats or weight changes.  Cardiovascular: negative for chest pain, dyspnea on exertion, edema, orthopnea, palpitations, paroxysmal nocturnal dyspnea or shortness of breath Dermatological: negative for rash Respiratory: negative for cough or wheezing Urologic: negative for hematuria Abdominal: negative for nausea, vomiting, diarrhea, bright red blood per rectum, melena, or hematemesis Neurologic: negative for visual changes, syncope, or dizziness All other systems reviewed and are otherwise negative except as noted above.    Blood pressure 138/70, pulse 93, height 5\' 4"  (1.626 m), weight 219 lb 3.2 oz (99.4 kg), SpO2 95 %.  General  appearance: alert and no distress Neck: no adenopathy, no carotid bruit, no JVD, supple, symmetrical, trachea midline and thyroid not enlarged, symmetric, no tenderness/mass/nodules Lungs: clear to auscultation bilaterally Heart: regular rate and rhythm, S1, S2 normal, no murmur, click, rub or gallop Extremities: 3+ edema with erythema and venous stasis changes. Pulses: 2+ and symmetric Skin: Erythema bilaterally and venous stasis changes in her lower extremities Neurologic: Alert and oriented X 3, normal strength and tone. Normal symmetric reflexes. Normal coordination and gait  EKG sinus rhythm at 93 with inferior Q waves and reverse R wave progression.  I personally reviewed this EKG.  ASSESSMENT AND PLAN:   HTN (hypertension) History of essential hypertension currently not on antihypertensive medications with blood  pressure measured today in the office of 138/70.  Hyperlipidemia History of hyperlipidemia intolerant to statin therapy on Zetia and fenofibrate with lipid profile performed 02/10/2019 revealing total cholesterol of 193, LDL of 112 and HDL of 58.  Venous insufficiency, peripheral History of venous insufficiency/reflux by duplex ultrasound with 2-3+ tense pitting bilateral lower extremity edema with venous stasis changes and erythema on diuretics.  Lower leg edema Probably multifactorial from diastolic dysfunction, and venous insufficiency.  She is on furosemide 40 mg a day.  She had been on 40 mg p.o. twice daily but is no longer because of increased urination frequency      Lorretta Harp MD [facility], [facility] 08/27/2019 9:54 AM

## 2019-08-27 NOTE — Assessment & Plan Note (Signed)
Probably multifactorial from diastolic dysfunction, and venous insufficiency.  She is on furosemide 40 mg a day.  She had been on 40 mg p.o. twice daily but is no longer because of increased urination frequency

## 2019-08-27 NOTE — Assessment & Plan Note (Signed)
History of essential hypertension currently not on antihypertensive medications with blood pressure measured today in the office of 138/70.

## 2019-09-07 DIAGNOSIS — H01023 Squamous blepharitis right eye, unspecified eyelid: Secondary | ICD-10-CM | POA: Diagnosis not present

## 2019-09-07 DIAGNOSIS — H01026 Squamous blepharitis left eye, unspecified eyelid: Secondary | ICD-10-CM | POA: Diagnosis not present

## 2019-09-07 DIAGNOSIS — H04121 Dry eye syndrome of right lacrimal gland: Secondary | ICD-10-CM | POA: Diagnosis not present

## 2019-09-12 ENCOUNTER — Other Ambulatory Visit: Payer: Self-pay | Admitting: Family Medicine

## 2019-09-16 ENCOUNTER — Telehealth: Payer: Self-pay | Admitting: Cardiovascular Disease

## 2019-09-16 NOTE — Telephone Encounter (Signed)
°*  STAT* If patient is at the pharmacy, call can be transferred to refill team.   1. Which medications need to be refilled? (please list name of each medication and dose if known)  furosemide (LASIX) 40 MG tablet  2. Which pharmacy/location (including street and city if local pharmacy) is medication to be sent to? [facility] - [place], [place] - [address]   3. Do they need a 30 day or 90 day supply?   90 day supply  

## 2019-09-17 ENCOUNTER — Other Ambulatory Visit: Payer: Self-pay

## 2019-09-17 ENCOUNTER — Other Ambulatory Visit: Payer: Self-pay | Admitting: General Practice

## 2019-09-17 DIAGNOSIS — M25712 Osteophyte, left shoulder: Secondary | ICD-10-CM | POA: Diagnosis not present

## 2019-09-17 DIAGNOSIS — M19012 Primary osteoarthritis, left shoulder: Secondary | ICD-10-CM | POA: Diagnosis not present

## 2019-09-17 MED ORDER — FUROSEMIDE 40 MG PO TABS: 40.0000 mg | ORAL_TABLET | Freq: Every day | ORAL | 3 refills | Status: DC

## 2019-09-17 MED ORDER — OMEPRAZOLE 20 MG PO CPDR: 20.0000 mg | DELAYED_RELEASE_CAPSULE | Freq: Every day | ORAL | 1 refills | Status: DC

## 2019-10-11 ENCOUNTER — Telehealth: Payer: Self-pay

## 2019-10-11 NOTE — Progress Notes (Unsigned)
Chronic Care Management Pharmacy Assistant   Name: Kristen Manning  MRN: [id number] DOB: [birth year]  Reason for Encounter: Disease State   PCP : Midge Minium, MD  Allergies:   Allergies  Allergen Reactions  . Aspirin Other (See Comments)    Noted caused 2 holes in retina, not sure   . Codeine Hives  . Statins     Intolerance to statins  . Tizanidine Hcl Other (See Comments)    Confusion    Medications: Outpatient Encounter Medications as of 10/11/2019  Medication Sig  . acetaminophen (TYLENOL) 500 MG tablet Take 1 tablet (500 mg total) by mouth every 8 (eight) hours as needed for moderate pain.  [name] allopurinol (ZYLOPRIM) 300 MG tablet Take 300 mg by mouth as needed.   [name] amoxicillin (AMOXIL) 875 MG tablet Take 1 tablet (875 mg total) by mouth 2 (two) times daily.  [name] b complex vitamins tablet Take 1 tablet by mouth daily.  . Calcium 600-200 MG-UNIT tablet Take 1 tablet by mouth daily.  . Coenzyme Q10 100 MG capsule Take 100 mg by mouth daily.   [name] ezetimibe (ZETIA) 10 MG tablet TAKE ONE TABLET BY MOUTH EVERY MORNING  . fenofibrate 160 MG tablet TAKE ONE TABLET BY MOUTH EVERY MORNING  . fluticasone (FLONASE) 50 MCG/ACT nasal spray Place 2 sprays into both nostrils daily.  . furosemide (LASIX) 40 MG tablet Take 1 tablet (40 mg total) by mouth daily.  [name] levothyroxine (SYNTHROID) 88 MCG tablet Take 1 tablet (88 mcg total) by mouth daily.  [name] loratadine (CLARITIN) 10 MG tablet Take 1 tablet (10 mg total) by mouth daily.  [name] MAXITROL 3.5-10000-0.1 OINT Apply 1 Application Both Eyes every evening  . NON FORMULARY Take 1 tablet by mouth daily.  [name] omeprazole (PRILOSEC) 20 MG capsule Take 1 capsule (20 mg total) by mouth daily.  . prednisoLONE acetate (PRED FORTE) 1 % ophthalmic suspension 1 drop every morning.  . triamcinolone (NASACORT) 55 MCG/ACT AERO nasal inhaler Place 2 sprays into the nose daily.   No facility-administered encounter medications on file as of 10/11/2019.      Current Diagnosis: Patient Active Problem List   Diagnosis Date Noted  . Chronic left shoulder pain 03/10/2019  . Morbid obesity ([place]) 02/11/2018  . DDD (degenerative disc disease), lumbar 10/07/2016  . Primary osteoarthritis of both hands 10/07/2016  . Primary osteoarthritis of both feet 10/07/2016  . Tension-type headache, not intractable 09/16/2016  . GERD (gastroesophageal reflux disease) 09/11/2016  . ANA positive 09/02/2016  . History of total knee replacement, bilateral 03/20/2015  . Hypothyroidism 09/21/2014  . Gout 06/20/2014  . Lower leg edema 06/13/2014  . Chest wall pain 04/29/2014  . Meningioma ([place]) 10/07/2013  . Chronic tension-type headache, intractable 10/07/2013  . Depression 06/23/2013  . Insomnia 05/26/2013  . Routine general medical examination at a health care facility 10/17/2011  . HTN (hypertension) 07/03/2011  . Hyperlipidemia 07/03/2011  . Venous insufficiency, peripheral 07/03/2011    Reviewed chart for medication changes ahead of medication coordination call.  No OVs, Consults, or hospital visits since last care coordination call/Pharmacist visit. (If appropriate, list visit date, provider name)  No medication changes indicated OR if recent visit, treatment plan here.  BP Readings from Last 3 Encounters:  08/27/19 138/70  02/09/19 136/80  10/26/18 130/90    No results found for: HGBA1C   Patient obtains medications through [facility]  {Day Supply:23806}   Last adherence delivery included: (medication name and frequency)  Patient declined (meds) last month due to PRN use/additional supply on hand. Explanation of abundance on hand (ie #30 due to overlapping fills or previous adherence issues etc)  Patient is due for next adherence delivery on: ***. Called patient and reviewed medications and coordinated delivery.  This delivery to include: Patient will need a short fill of (med), prior to adherence delivery. (To align with  sync date or if PRN med)  Coordinated acute fill for (med) to be delivered (date).  Patient declined the following medications (meds) due to (reason)  Patient needs refills for ***.  Confirmed delivery date of ***, advised patient that pharmacy will contact them the morning of delivery.      Follow-Up:  Pharmacist Review

## 2019-10-22 ENCOUNTER — Other Ambulatory Visit: Payer: Self-pay | Admitting: Family Medicine

## 2019-10-22 DIAGNOSIS — Z1231 Encounter for screening mammogram for malignant neoplasm of breast: Secondary | ICD-10-CM

## 2019-10-25 ENCOUNTER — Ambulatory Visit: Payer: Medicare Other

## 2019-10-25 DIAGNOSIS — H00021 Hordeolum internum right upper eyelid: Secondary | ICD-10-CM | POA: Diagnosis not present

## 2019-10-25 DIAGNOSIS — H00031 Abscess of right upper eyelid: Secondary | ICD-10-CM | POA: Diagnosis not present

## 2019-10-27 ENCOUNTER — Encounter: Payer: Medicare Other | Admitting: Family Medicine

## 2019-11-08 ENCOUNTER — Encounter: Payer: Medicare Other | Admitting: Family Medicine

## 2019-11-09 ENCOUNTER — Ambulatory Visit (INDEPENDENT_AMBULATORY_CARE_PROVIDER_SITE_OTHER): Payer: Medicare Other | Admitting: Family Medicine

## 2019-11-09 ENCOUNTER — Encounter: Payer: Self-pay | Admitting: Family Medicine

## 2019-11-09 ENCOUNTER — Other Ambulatory Visit: Payer: Medicare Other

## 2019-11-09 ENCOUNTER — Other Ambulatory Visit: Payer: Self-pay

## 2019-11-09 VITALS — BP 138/80 | HR 94 | Temp 97.4°F | Resp 17 | Ht 64.0 in | Wt 217.4 lb

## 2019-11-09 DIAGNOSIS — Z0000 Encounter for general adult medical examination without abnormal findings: Secondary | ICD-10-CM

## 2019-11-09 DIAGNOSIS — Z23 Encounter for immunization: Secondary | ICD-10-CM

## 2019-11-09 DIAGNOSIS — F32A Depression, unspecified: Secondary | ICD-10-CM | POA: Diagnosis not present

## 2019-11-09 LAB — CBC WITH DIFFERENTIAL/PLATELET
Basophils Absolute: 0.1 10*3/uL (ref 0.0–0.1)
Basophils Relative: 0.6 % (ref 0.0–3.0)
Eosinophils Absolute: 0.1 10*3/uL (ref 0.0–0.7)
Eosinophils Relative: 0.7 % (ref 0.0–5.0)
HCT: 39.9 % (ref 36.0–46.0)
Hemoglobin: 13.6 g/dL (ref 12.0–15.0)
Lymphocytes Relative: 18.9 % (ref 12.0–46.0)
Lymphs Abs: 2.1 10*3/uL (ref 0.7–4.0)
MCHC: 34 g/dL (ref 30.0–36.0)
MCV: 89.9 fl (ref 78.0–100.0)
Monocytes Absolute: 0.8 10*3/uL (ref 0.1–1.0)
Monocytes Relative: 7.4 % (ref 3.0–12.0)
Neutro Abs: 8.2 10*3/uL — ABNORMAL HIGH (ref 1.4–7.7)
Neutrophils Relative %: 72.4 % (ref 43.0–77.0)
Platelets: 315 10*3/uL (ref 150.0–400.0)
RBC: 4.44 Mil/uL (ref 3.87–5.11)
RDW: 14.4 % (ref 11.5–15.5)
WBC: 11.3 10*3/uL — ABNORMAL HIGH (ref 4.0–10.5)

## 2019-11-09 LAB — HEPATIC FUNCTION PANEL
ALT: 16 U/L (ref 0–35)
AST: 18 U/L (ref 0–37)
Albumin: 3.9 g/dL (ref 3.5–5.2)
Alkaline Phosphatase: 67 U/L (ref 39–117)
Bilirubin, Direct: 0.1 mg/dL (ref 0.0–0.3)
Total Bilirubin: 0.6 mg/dL (ref 0.2–1.2)
Total Protein: 7.3 g/dL (ref 6.0–8.3)

## 2019-11-09 LAB — BASIC METABOLIC PANEL
BUN: 19 mg/dL (ref 6–23)
CO2: 29 mEq/L (ref 19–32)
Calcium: 9.4 mg/dL (ref 8.4–10.5)
Chloride: 102 mEq/L (ref 96–112)
Creatinine, Ser: 1.18 mg/dL (ref 0.40–1.20)
GFR: 42.24 mL/min — ABNORMAL LOW (ref >60.00)
Glucose, Bld: 91 mg/dL (ref 70–99)
Potassium: 3.9 mEq/L (ref 3.5–5.1)
Sodium: 139 mEq/L (ref 135–145)

## 2019-11-09 LAB — LIPID PANEL
Cholesterol: 167 mg/dL (ref 0–200)
HDL: 54.8 mg/dL (ref >39.00)
LDL Cholesterol: 95 mg/dL (ref 0–99)
NonHDL: 111.95
Total CHOL/HDL Ratio: 3
Triglycerides: 83 mg/dL (ref 0.0–149.0)
VLDL: 16.6 mg/dL (ref 0.0–40.0)

## 2019-11-09 LAB — TSH: TSH: 1.27 u[IU]/mL (ref 0.35–4.50)

## 2019-11-09 NOTE — Assessment & Plan Note (Signed)
Ongoing issue.  BMI is 37.32 and with her HTN and hyperlipidemia this qualifies as morbid obesity.  Check labs to risk stratify.

## 2019-11-09 NOTE — Progress Notes (Signed)
Subjective:    Patient ID: Kristen Manning, female    DOB: [birth year], 86 y.o.   MRN: [id number]  HPI CPE- no longer doing colon cancer screening.  Mammogram ordered.  UTD on COVID vaccine- needs booster (pt will schedule).  Will get flu shot today.  UTD on pneumonia vaccines.  Reviewed past medical, surgical, family and social histories.   Patient Care Team    Relationship Specialty Notifications Start End  Midge Minium, MD PCP - General Family Medicine  01/08/18   Vickey Huger, MD Consulting Physician Orthopedic Surgery  08/16/13   Harriett Sine, MD Consulting Physician Dermatology  03/21/14   Jana Half, [place] Consulting Physician Podiatry  03/21/15   Cameron Sprang, MD Consulting Physician Neurology  04/03/15   Lorretta Harp, MD Consulting Physician Cardiology  09/11/16   Madelin Rear, [facility] Pharmacist Pharmacist  04/02/19    Comment: phone number [phone]  Madelin Rear, [facility] Pharmacist Pharmacist  04/08/19    Comment: phone number [phone]    Health Maintenance  Topic Date Due  . INFLUENZA VACCINE  08/08/2019  . MAMMOGRAM  09/17/2019  . TETANUS/TDAP  11/27/2022  . DEXA SCAN  Completed  . COVID-19 Vaccine  Completed  . PNA vac Low Risk Adult  Completed      Review of Systems Patient reports no vision/ hearing changes, adenopathy,fever, weight change,  persistant/recurrent hoarseness , swallowing issues, chest pain, palpitations, edema, persistant/recurrent cough, hemoptysis, dyspnea (rest/exertional/paroxysmal nocturnal), gastrointestinal bleeding (melena, rectal bleeding), abdominal pain, significant heartburn, bowel changes, GU symptoms (dysuria, hematuria, incontinence), Gyn symptoms (abnormal  bleeding, pain),  syncope, focal weakness, memory loss, numbness & tingling, skin/hair/nail changes, abnormal bruising or bleeding.   + depression- brother (who lives w/ her) is quite sick.  Has upcoming surgery for a bladder mass.  This visit occurred during the  SARS-CoV-2 public health emergency.  Safety protocols were in place, including screening questions prior to the visit, additional usage of staff PPE, and extensive cleaning of exam room while observing appropriate contact time as indicated for disinfecting solutions.       Objective:   Physical Exam General Appearance:    Alert, cooperative, no distress, appears stated age, obese  Head:    Normocephalic, without obvious abnormality, atraumatic  Eyes:    PERRL, conjunctiva/corneas clear, EOM's intact, fundi    benign, both eyes  Ears:    Normal TM's and external ear canals, both ears  Nose:   Deferred due to COVID  Throat:   Neck:   Supple, symmetrical, trachea midline, no adenopathy;    Thyroid: no enlargement/tenderness/nodules  Back:     Symmetric, no curvature, ROM normal, no CVA tenderness  Lungs:     Clear to auscultation bilaterally, respirations unlabored  Chest Wall:    No tenderness or deformity   Heart:    Regular rate and rhythm, S1 and S2 normal, no murmur, rub   or gallop  Breast Exam:    Deferred to GYN  Abdomen:     Soft, non-tender, bowel sounds active all four quadrants,    no masses, no organomegaly  Genitalia:    Deferred to GYN  Rectal:    Extremities:   Extremities normal, atraumatic.  + swelling w/ chronic venous stasis changes  Pulses:   1+ of lower legs, 2+ radial pulses  Skin:   Skin color, texture, turgor normal, no rashes or lesions  Lymph nodes:   Cervical, supraclavicular, and axillary nodes normal  Neurologic:   CNII-XII  intact, normal strength, sensation and reflexes    throughout          Assessment & Plan:

## 2019-11-09 NOTE — Assessment & Plan Note (Signed)
Pt's PE unchanged from previous and WNL w/ exception of obesity and chronic venous stasis changes.  No longer doing colon cancer screen.  Is due for mammo- ordered and pt to call and schedule.  UTD on COVID- plans to get booster.  Flu shot given today.  Check labs.  Anticipatory guidance provided.

## 2019-11-09 NOTE — Assessment & Plan Note (Signed)
Deteriorated.  Pt is very stressed w/ brother's health issues.  Will continue to follow closely.

## 2019-11-09 NOTE — Patient Instructions (Signed)
Follow up in 6 months to recheck BP and cholesterol We'll notify you of your lab results and make any changes if needed [facility] have the booster shot available- you can walk in to schedule Please call and schedule your mammogram with the [facility] [phone]) Call with any questions or concerns Stay Safe!  Stay Healthy! Hang in there!!

## 2019-11-10 ENCOUNTER — Encounter: Payer: Self-pay | Admitting: General Practice

## 2019-11-15 IMAGING — CT CT ANGIO CHEST-ABD-PELV FOR DISSECTION W/ AND WO/W CM
2 of 9 series · 12 of 46 positions shown, 14 images · IV contrast (APPLIED)
Comparison: No prior chest CT. Chest x-rays 09/06/2016 and earlier.
CT abdomen and pelvis 09/06/2016.

CLINICAL DATA: 85-year-old with acute onset of chest pain radiating
through to the back and generalized abdominal pain that awoke the
patient from sleep this morning. Patient also complains of a mild
cough.

EXAM:
CT ANGIOGRAPHY CHEST, ABDOMEN AND PELVIS
TECHNIQUE: Initially, multidetector CT imaging through the chest was performed
prior to IV contrast administration. Subsequently, multidetector CT
imaging through the chest, abdomen and pelvis was performed using
the standard aortic dissection protocol during bolus administration
of intravenous contrast. Multiplanar reconstructed images and MIPs
were obtained and reviewed to evaluate the vascular anatomy.
CONTRAST:  100mL [id number] IOPAMIDOL INJECTION 76% IV.

[Series 6: axial arterial · axial · arterial · 0.98mm/px · z∈[-595,-67]mm · 9 of 211 slices shown, 11 images]
[im 23/211  soft-tissue]
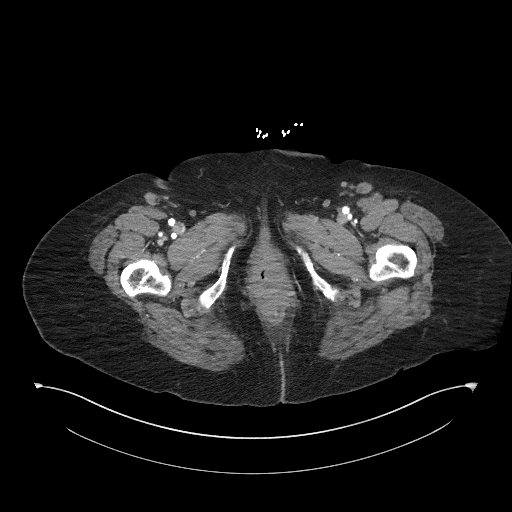
[im 23/211  bone]
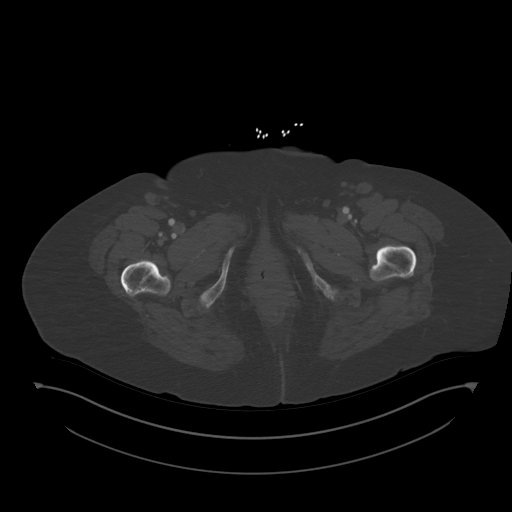
[im 45/211  soft-tissue]
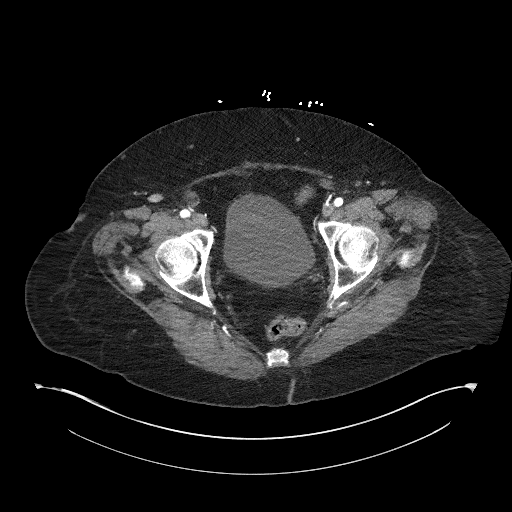
[im 67/211  soft-tissue]
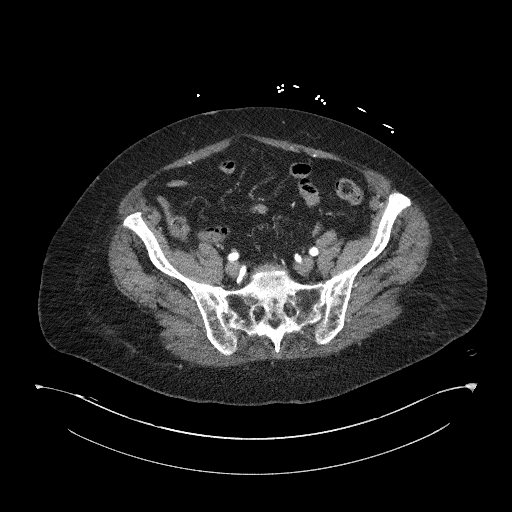
[im 89/211  soft-tissue]
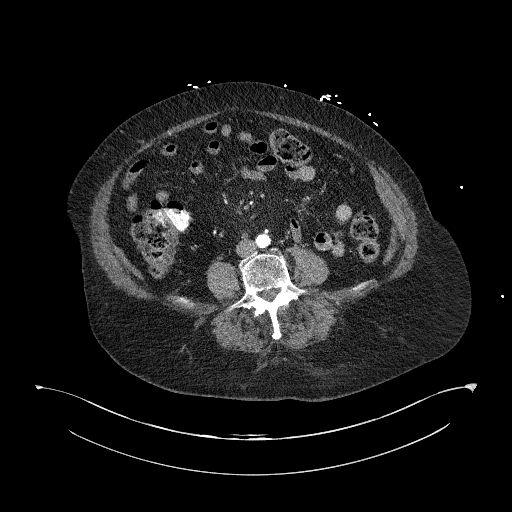
[im 111/211  soft-tissue]
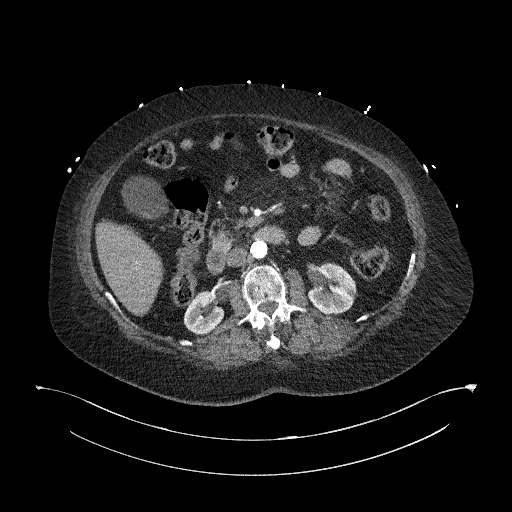
[im 133/211  soft-tissue]
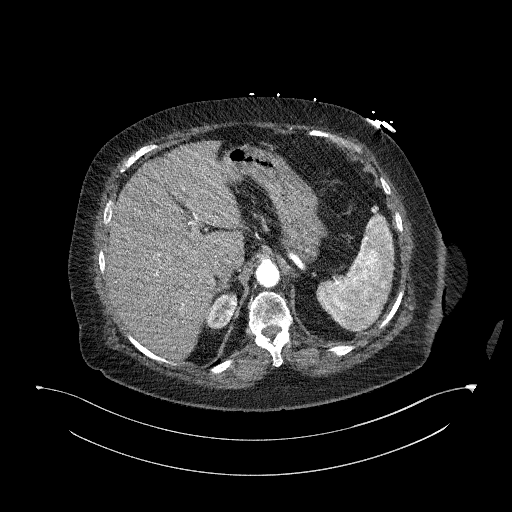
[im 155/211  soft-tissue]
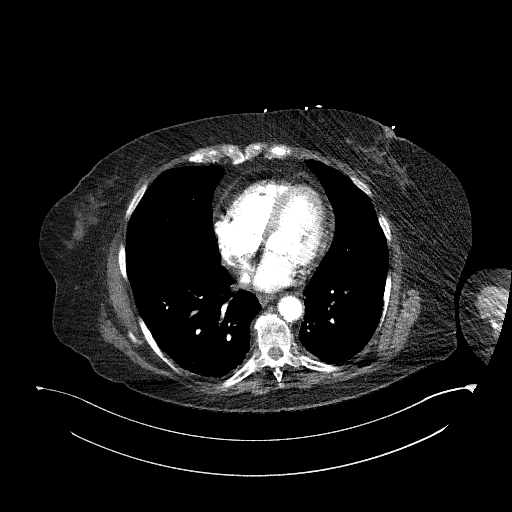
[im 177/211  soft-tissue]
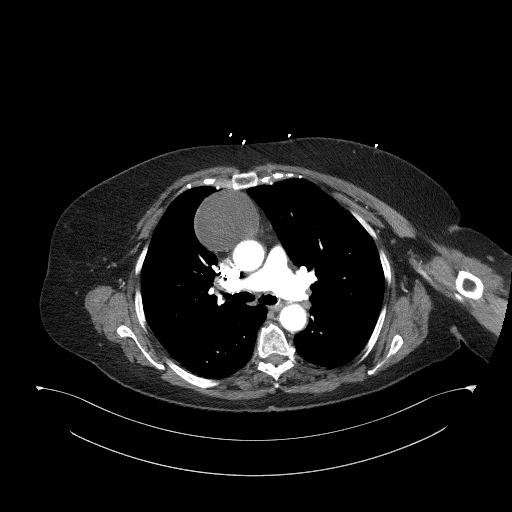
[im 199/211  soft-tissue]
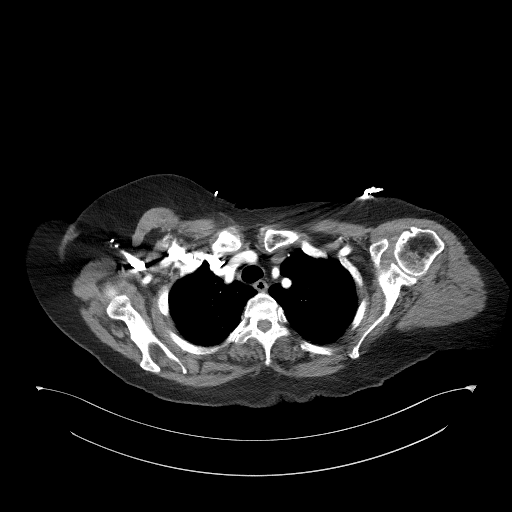
[im 199/211  bone]
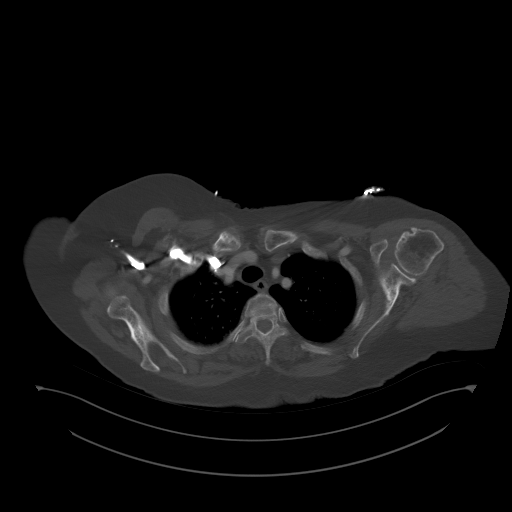

[Series 10: coronals · coronal · 0.98mm/px · 3 of 161 slices shown]
[im 41/161  soft-tissue]
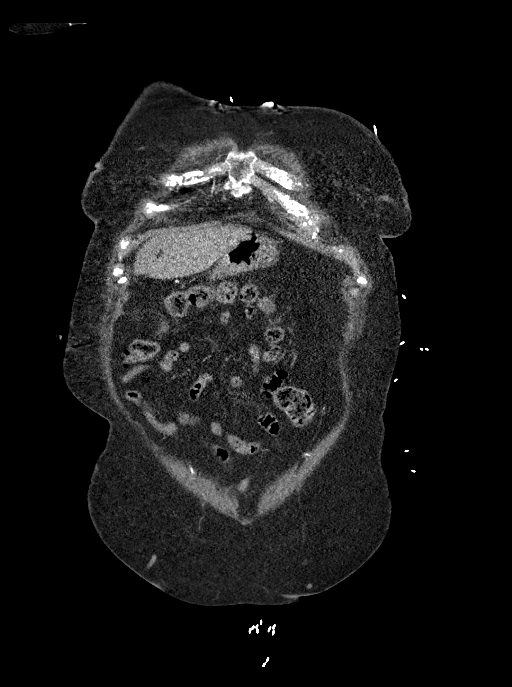
[im 81/161  soft-tissue]
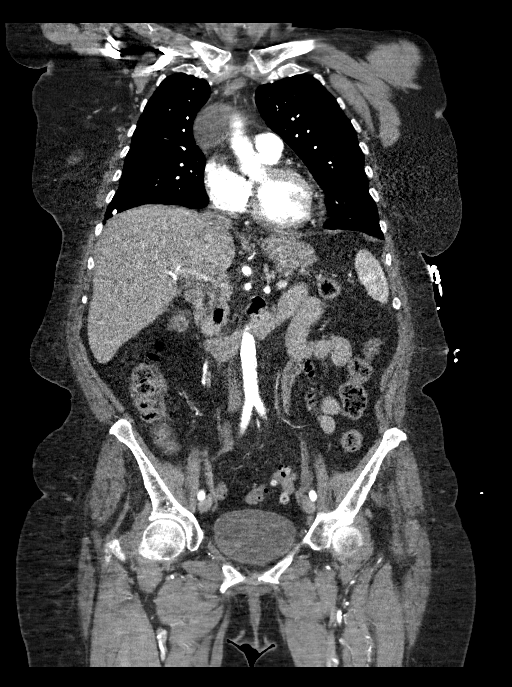
[im 121/161  soft-tissue]
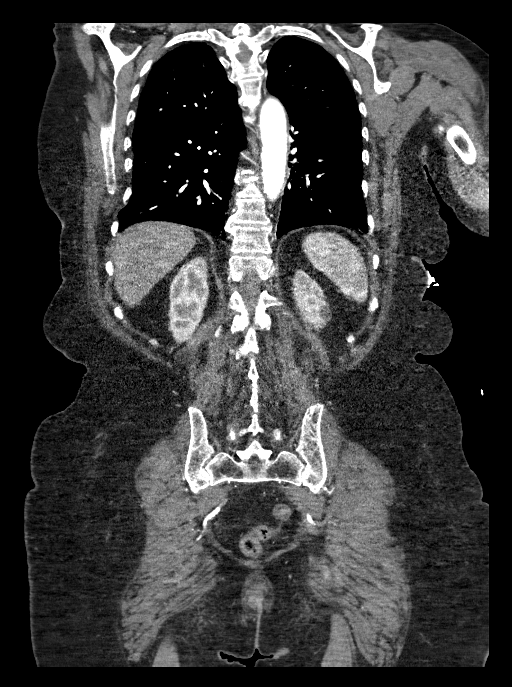

[12 of 46 positions shown; findings below may reference images not displayed]

FINDINGS: CTA CHEST FINDINGS

Cardiovascular: No evidence of mural hematoma on the unenhanced
images of the chest. No evidence of thoracic aortic dissection on
the post contrast images. No evidence of thoracic aortic aneurysm.
Moderate thoracic aortic atherosclerosis. Central pulmonary arteries
patent.

Normal heart size. Prominent epicardial fat. Mild three-vessel
coronary atherosclerosis. No pericardial effusion.

Mediastinum/Nodes: Cystic mass with associated enhancing soft tissue
elements in the ANTERIOR mediastinum to the RIGHT of midline
measuring approximately 6.2 x 6.4 x 6.5 cm. No other mediastinal
masses. Numerous normal sized lymph nodes throughout the mediastinum
and in both hila. No pathologic lymphadenopathy. Normal appearing
esophagus. Heterogeneously enhancing approximate 1.8 cm nodule
involving the LOWER pole of the RIGHT lobe of the thyroid gland.

Lungs/Pleura: BILATERAL lower lobe bronchiectasis, LEFT greater than
RIGHT with associated mild scarring. Lungs otherwise clear. No
pulmonary parenchymal nodules or masses. No evidence of interstitial
lung disease. Central airways patent without significant bronchial
wall thickening. No pleural effusions.

Musculoskeletal: Degenerative disc disease and spondylosis
throughout the thoracic spine. Osseous demineralization. No acute
findings.

Review of the MIP images confirms the above findings.

CTA ABDOMEN AND PELVIS FINDINGS

VASCULAR

Aorta: No evidence of dissection or aneurysm. Severe
atherosclerosis.

Celiac: Patent. Atherosclerosis at its origin without evidence of
significant stenosis.

SMA: Patent. Atherosclerosis at its origin without evidence of
significant stenosis.

Renals: Single renal arteries bilaterally which are widely patent.

IMA: Patent. Atherosclerosis at its origin without evidence of
significant stenosis.

Inflow: Moderate BILATERAL iliofemoral atherosclerosis without
evidence of significant stenosis.

Veins: Not evaluated.

Review of the MIP images confirms the above findings.

NON-VASCULAR

Hepatobiliary: Mild diffuse hepatic steatosis without focal hepatic
parenchymal abnormality. Sludge and small gallstones in the
gallbladder. No pericholecystic inflammation. No biliary ductal
dilation.

Pancreas: Severely atrophic head and body. No mass or peripancreatic
inflammation.

Spleen: Normal appearance for the early arterial phase of
enhancement which accounts for the heterogeneous enhancement.

Adrenals/Urinary Tract: Approximate 1.6 x 2.4 cm nodule arising from
the LATERAL limb of the LEFT adrenal gland which contains fat, not
significantly changed in size or appearance since the prior CT
abdomen and pelvis 09/06/2016. Normal appearing RIGHT adrenal gland.

BILATERAL extrarenal pelves. Benign cortical cysts involving the
LEFT kidney as noted on the prior CT. No significant parenchymal
abnormalities involving either kidney. No hydronephrosis. No visible
urinary tract calculi. Normal appearing urinary bladder.

Stomach/Bowel: Small hiatal hernia containing fat. Stomach
decompressed and normal in appearance. Normal-appearing small bowel.
Distal descending and sigmoid colon diverticulosis without evidence
of acute diverticulitis. Remainder of the colon normal in
appearance. Opaque ingested material in the cecum. Normal-appearing
short decompressed appendix in the RIGHT UPPER pelvis.

Lymphatic: No pathologic lymphadenopathy.

Reproductive: Surgically absent or markedly atrophic uterus. No
adnexal masses.

Other: RIGHT POSTERIOR diaphragmatic hernia containing fat.

Musculoskeletal: Osseous demineralization. Degenerative disc disease
at L2-3, L4-5 and L5-S1. Facet degenerative changes at L3-4, L4-5
and L5-S1. Slight degenerative grade 1 spondylolisthesis of L4 on
L5. No acute findings.

Review of the MIP images confirms the above findings.
IMPRESSION: 1. No evidence of thoracic or abdominal aortic aneurysm or
dissection.
2. No acute cardiopulmonary disease.
3. Cystic mass with associated enhancing soft tissue elements in the
anterior mediastinum to the right of midline. Cystic teratoma and
thymic cyst/thymoma are the leading differential diagnostic
considerations.
4. Approximate 1.8 cm nodule involving the lower pole of the right
lobe of the thyroid gland. Thyroid ultrasound is recommended in
further evaluation. This follows ACR consensus guidelines: Managing
Incidental Thyroid Nodules Detected on Imaging: White Paper of [REDACTED]. [HOSPITAL] 7863;
[DATE].
5. No acute abnormalities involving the abdomen or pelvis.
6. Descending and sigmoid colon diverticulosis without evidence of
acute diverticulitis.
7. Small hiatal hernia containing fat. RIGHT POSTERIOR diaphragmatic
hernia containing fat.
8. Mild diffuse hepatic steatosis without focal hepatic parenchymal
abnormality.
9. Cholelithiasis without CT evidence of acute cholecystitis.
10.  Aortic Atherosclerosis (PNGBU-170.0)

## 2019-11-29 IMAGING — CT NUCLEAR MEDICINE PET IMAGE INITIAL (PI) SKULL BASE TO THIGH
1 of 8 series · 3 of 16 positions shown, 4 images · non-contrast
Comparison: Chest, abdomen and pelvis CTA 03/10/2018.
Abdominopelvic CT 09/06/2016.

CLINICAL DATA: Initial treatment strategy for mediastinal mass on
CT.

EXAM:
NUCLEAR MEDICINE PET SKULL BASE TO THIGH
TECHNIQUE: 10.48 mCi F-18 FDG was injected intravenously. Full-ring PET imaging
was performed from the skull base to thigh after the radiotracer. CT
data was obtained and used for attenuation correction and anatomic
localization.
Fasting blood glucose: 93 mg/dl

[Series 4: ct sk_thigh 5.0 hd_fov · axial · 1.17mm/px · z∈[-754,+114]mm · 3 of 218 slices shown, 4 images]
[im 1/218  soft-tissue]
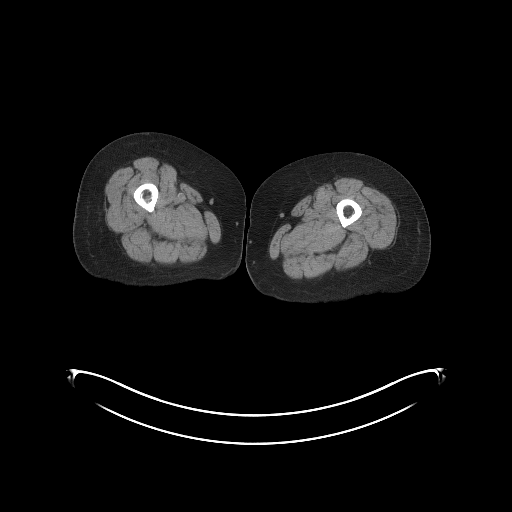
[im 1/218  bone]
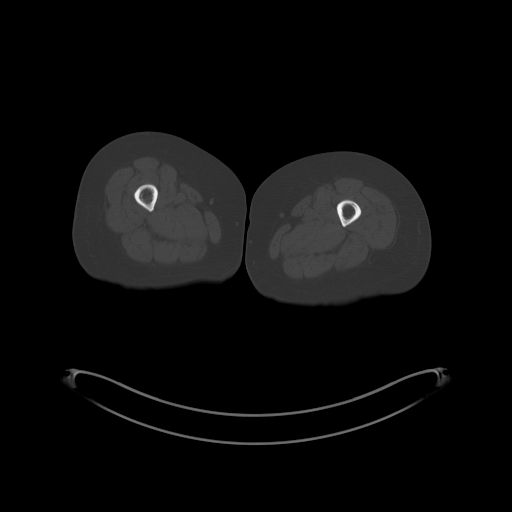
[im 109/218  soft-tissue]
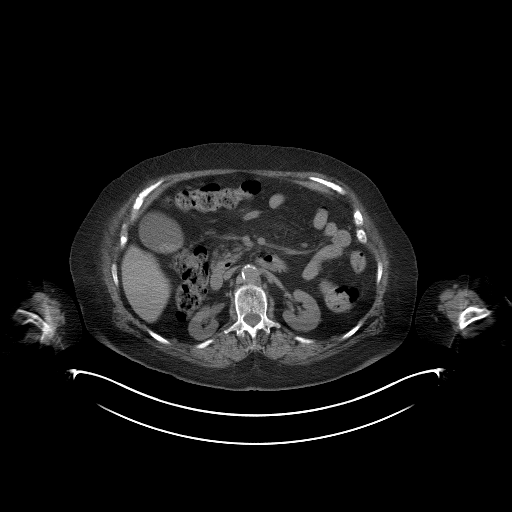
[im 218/218  soft-tissue]
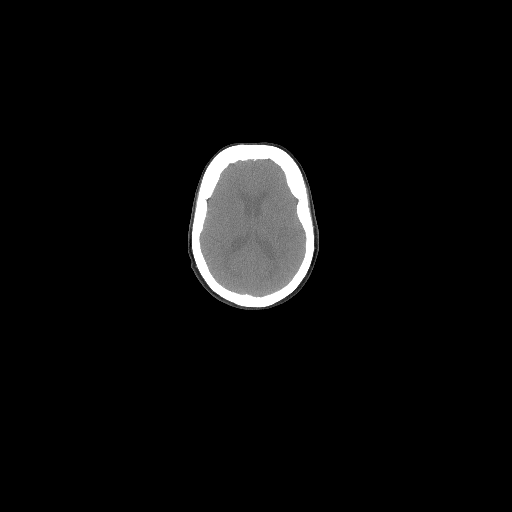

[3 of 16 positions shown; findings below may reference images not displayed]

FINDINGS: Mediastinal blood pool activity: SUV max

NECK:

No hypermetabolic cervical lymph nodes are identified.There are no
lesions of the pharyngeal mucosal space. 19 mm right thyroid nodule
on image 49/4 demonstrates no hypermetabolic activity.

Incidental CT findings: none

CHEST:

There are no hypermetabolic mediastinal, hilar or axillary lymph
nodes. The predominantly cystic right anterior mediastinal mass
appears stable from the recent CT, measuring 6.5 x 5.6 cm on image
69/4. The lesion is well-circumscribed with peripheral solid nodular
components, probably enhancing on prior CT. The solid components are
mildly hypermetabolic, especially posteriorly (SUV max 3.9). No
suspicious pulmonary activity. Small right-sided lung nodules are
stable, some of which are calcified. These are too small to
characterize by PET-CT but likely benign.

Incidental CT findings: Mild lower lobe bronchiectasis.
Atherosclerosis of the aorta, great vessels and coronary arteries.

ABDOMEN/PELVIS:

There is no hypermetabolic activity within the liver, adrenal
glands, spleen or pancreas. There is no hypermetabolic nodal
activity. There is focal hypermetabolic activity in the left lower
quadrant at the junction of the descending and sigmoid colon. This
has an SUV max of 16.8. There is no clear corresponding abnormality
on the CT images or recent CTA. There are moderate sigmoid colon
diverticular changes, but no surrounding inflammatory change.

Incidental CT findings: 17 mm left adrenal adenoma on image 105/4 is
stable. Cholelithiasis and aortic atherosclerosis are noted.
Probable pelvic floor laxity.

SKELETON:

There is no hypermetabolic activity to suggest osseous metastatic
disease.

Incidental CT findings: Mild lumbar spondylosis.
IMPRESSION: 1. There is low-level hypermetabolic activity within the solid
components of the complex cystic anterior mediastinal mass. This
supports a neoplastic process, possibly cystic teratoma or atypical
thymic lesion. The lesion demonstrates no aggressive morphologic
characteristics.
2. No evidence of metastatic disease. Small right lung nodules are
likely benign.
3. Indeterminate focal hypermetabolic activity near the junction of
the descending and sigmoid colon, without definite CT correlate.
This could be related to adjacent diverticular disease, although a
small colonic polyp is possible. Correlation with the patient's
colon cancer screening history is recommended. If screening is not
up-to-date, appropriate screening should be considered.
4. Incidental findings including a low-density right thyroid nodule
without hypermetabolic activity, a left adrenal adenoma, colonic
diverticulosis and Aortic Atherosclerosis ([id number]).

## 2019-12-06 ENCOUNTER — Other Ambulatory Visit: Payer: Self-pay | Admitting: Family Medicine

## 2019-12-15 ENCOUNTER — Telehealth: Payer: Self-pay

## 2019-12-15 NOTE — Progress Notes (Signed)
Chronic Care Management Pharmacy Assistant   Name: IRINI LEET  MRN: [id number] DOB: 1932-05-10  Reason for Encounter: Medication Review  PCP : Midge Minium, MD  Allergies:   Allergies  Allergen Reactions  . Aspirin Other (See Comments)    Noted caused 2 holes in retina, not sure   . Codeine Hives  . Statins     Intolerance to statins  . Tizanidine Hcl Other (See Comments)    Confusion    Medications: Outpatient Encounter Medications as of 12/15/2019  Medication Sig  . acetaminophen (TYLENOL) 500 MG tablet Take 1 tablet (500 mg total) by mouth every 8 (eight) hours as needed for moderate pain.  [name] allopurinol (ZYLOPRIM) 300 MG tablet Take 300 mg by mouth as needed.   [name] amoxicillin (AMOXIL) 875 MG tablet Take 1 tablet (875 mg total) by mouth 2 (two) times daily. (Patient not taking: Reported on 11/09/2019)  . b complex vitamins tablet Take 1 tablet by mouth daily.  . Calcium 600-200 MG-UNIT tablet Take 1 tablet by mouth daily.  . Coenzyme Q10 100 MG capsule Take 100 mg by mouth daily.   [name] ezetimibe (ZETIA) 10 MG tablet TAKE ONE TABLET BY MOUTH EVERY MORNING  . fenofibrate 160 MG tablet TAKE ONE TABLET BY MOUTH EVERY MORNING  . fluticasone (FLONASE) 50 MCG/ACT nasal spray Place 2 sprays into both nostrils daily.  . furosemide (LASIX) 40 MG tablet Take 1 tablet (40 mg total) by mouth daily.  [name] levothyroxine (SYNTHROID) 88 MCG tablet Take 1 tablet (88 mcg total) by mouth daily.  [name] loratadine (CLARITIN) 10 MG tablet Take 1 tablet (10 mg total) by mouth daily.  [name] MAXITROL 3.5-10000-0.1 OINT Apply 1 Application Both Eyes every evening (Patient not taking: Reported on 11/09/2019)  . NON FORMULARY Take 1 tablet by mouth daily.  [name] omeprazole (PRILOSEC) 20 MG capsule Take 1 capsule (20 mg total) by mouth daily.  . prednisoLONE acetate (PRED FORTE) 1 % ophthalmic suspension 1 drop every morning. (Patient not taking: Reported on 11/09/2019)  . triamcinolone (NASACORT) 55  MCG/ACT AERO nasal inhaler Place 2 sprays into the nose daily. (Patient not taking: Reported on 11/09/2019)   No facility-administered encounter medications on file as of 12/15/2019.    Current Diagnosis: Patient Active Problem List   Diagnosis Date Noted  . Chronic left shoulder pain 03/10/2019  . Morbid obesity ([place]) 02/11/2018  . DDD (degenerative disc disease), lumbar 10/07/2016  . Primary osteoarthritis of both hands 10/07/2016  . Primary osteoarthritis of both feet 10/07/2016  . Tension-type headache, not intractable 09/16/2016  . GERD (gastroesophageal reflux disease) 09/11/2016  . ANA positive 09/02/2016  . History of total knee replacement, bilateral 03/20/2015  . Hypothyroidism 09/21/2014  . Gout 06/20/2014  . Lower leg edema 06/13/2014  . Meningioma ([place]) 10/07/2013  . Chronic tension-type headache, intractable 10/07/2013  . Depression 06/23/2013  . Insomnia 05/26/2013  . Routine general medical examination at a health care facility 10/17/2011  . HTN (hypertension) 07/03/2011  . Hyperlipidemia 07/03/2011  . Venous insufficiency, peripheral 07/03/2011    Reviewed chart for medication changes ahead of medication coordination call.  No OVs, Consults, or hospital visits since last care coordination call/Pharmacist visit. (If appropriate, list visit date, provider name)  No medication changes indicated OR if recent visit, treatment plan here.  BP Readings from Last 3 Encounters:  11/09/19 138/80  08/27/19 138/70  02/09/19 136/80    No results found for: HGBA1C   Patient obtains medications through  Adherence Packaging  90 Days    Patient is due for next adherence delivery on: 12-17-2019. Called patient and reviewed medications and coordinated delivery.  This delivery to include:  Omeprazole 20mg  Take one capsule by mouth every morning  Furosemide 40mg  Tablet Take one tablet by mouth every morning  Fenofibrate 160 mg Tab Take one tablet by mouth every  morning  Ezetimibe 10mg  Take one tab by mouth every morning  Levothyroxine 88 mcg tab Take one tablet by mouth before breakfast  Maxitrol 3.5mg /g-10,00 unit/ g-0.1% eye ointment Apply to the right eye twice daily as directed   Patient needs refills for :  Levothyroxine 88 mcg tab Take one tablet by mouth before breakfast (CPP to request) Maxitrol 3.5mg /g-10,00 unit/ g-0.1% eye ointment Apply to the right eye twice daily as directed (CPP to request)   Confirmed delivery date of 12-17-19, advised patient that pharmacy will contact them the morning of delivery.  Georgiana Shore ,[place] Pharmacist Assistant [phone]  Follow-Up:  Pharmacist Review

## 2019-12-16 ENCOUNTER — Other Ambulatory Visit: Payer: Self-pay | Admitting: Family Medicine

## 2019-12-16 ENCOUNTER — Other Ambulatory Visit: Payer: Self-pay

## 2019-12-17 ENCOUNTER — Telehealth: Payer: Self-pay

## 2019-12-17 DIAGNOSIS — M19012 Primary osteoarthritis, left shoulder: Secondary | ICD-10-CM | POA: Diagnosis not present

## 2019-12-17 DIAGNOSIS — M25712 Osteophyte, left shoulder: Secondary | ICD-10-CM | POA: Diagnosis not present

## 2019-12-22 ENCOUNTER — Ambulatory Visit: Payer: Medicare Other

## 2020-01-11 ENCOUNTER — Telehealth: Payer: Self-pay

## 2020-01-11 NOTE — Progress Notes (Unsigned)
Chronic Care Management Pharmacy Assistant   Name: Kristen Manning  MRN: [id number] DOB: [birth year]  Reason for Encounter: Medication Review   PCP : Sheliah Hatch, MD  Allergies:   Allergies  Allergen Reactions  . Aspirin Other (See Comments)    Noted caused 2 holes in retina, not sure   . Codeine Hives  . Statins     Intolerance to statins  . Tizanidine Hcl Other (See Comments)    Confusion    Medications: Outpatient Encounter Medications as of 01/11/2020  Medication Sig  . acetaminophen (TYLENOL) 500 MG tablet Take 1 tablet (500 mg total) by mouth every 8 (eight) hours as needed for moderate pain.  [name] allopurinol (ZYLOPRIM) 300 MG tablet Take 300 mg by mouth as needed.   [name] amoxicillin (AMOXIL) 875 MG tablet Take 1 tablet (875 mg total) by mouth 2 (two) times daily. (Patient not taking: Reported on 11/09/2019)  . b complex vitamins tablet Take 1 tablet by mouth daily.  . Calcium 600-200 MG-UNIT tablet Take 1 tablet by mouth daily.  . Coenzyme Q10 100 MG capsule Take 100 mg by mouth daily.   [name] ezetimibe (ZETIA) 10 MG tablet TAKE ONE TABLET BY MOUTH EVERY MORNING  . fenofibrate 160 MG tablet TAKE ONE TABLET BY MOUTH EVERY MORNING  . fluticasone (FLONASE) 50 MCG/ACT nasal spray Place 2 sprays into both nostrils daily.  . furosemide (LASIX) 40 MG tablet Take 1 tablet (40 mg total) by mouth daily.  [name] levothyroxine (SYNTHROID) 88 MCG tablet TAKE ONE TABLET BY MOUTH BEFORE BREAKFAST  . loratadine (CLARITIN) 10 MG tablet Take 1 tablet (10 mg total) by mouth daily.  [name] MAXITROL 3.5-10000-0.1 OINT Apply 1 Application Both Eyes every evening (Patient not taking: Reported on 11/09/2019)  . NON FORMULARY Take 1 tablet by mouth daily.  [name] omeprazole (PRILOSEC) 20 MG capsule Take 1 capsule (20 mg total) by mouth daily.  . prednisoLONE acetate (PRED FORTE) 1 % ophthalmic suspension 1 drop every morning. (Patient not taking: Reported on 11/09/2019)  . triamcinolone (NASACORT) 55 MCG/ACT  AERO nasal inhaler Place 2 sprays into the nose daily. (Patient not taking: Reported on 11/09/2019)   No facility-administered encounter medications on file as of 01/11/2020.    Current Diagnosis: Patient Active Problem List   Diagnosis Date Noted  . Chronic left shoulder pain 03/10/2019  . Morbid obesity ([facility]) 02/11/2018  . DDD (degenerative disc disease), lumbar 10/07/2016  . Primary osteoarthritis of both hands 10/07/2016  . Primary osteoarthritis of both feet 10/07/2016  . Tension-type headache, not intractable 09/16/2016  . GERD (gastroesophageal reflux disease) 09/11/2016  . ANA positive 09/02/2016  . History of total knee replacement, bilateral 03/20/2015  . Hypothyroidism 09/21/2014  . Gout 06/20/2014  . Lower leg edema 06/13/2014  . Meningioma ([facility]) 10/07/2013  . Chronic tension-type headache, intractable 10/07/2013  . Depression 06/23/2013  . Insomnia 05/26/2013  . Routine general medical examination at a health care facility 10/17/2011  . HTN (hypertension) 07/03/2011  . Hyperlipidemia 07/03/2011  . Venous insufficiency, peripheral 07/03/2011    Reviewed chart for medication changes ahead of medication coordination call.  No OVs, Consults, or hospital visits since last care coordination call/Pharmacist visit. (If appropriate, list visit date, provider name)  No medication changes indicated OR if recent visit, treatment plan here.  BP Readings from Last 3 Encounters:  11/09/19 138/80  08/27/19 138/70  02/09/19 136/80    No results found for: HGBA1C   Patient obtains medications through Adherence  Packaging  30 Days   Patient is due for next adherence delivery on: 01-17-20. Called patient and reviewed medications and coordinated delivery.  This delivery to include: Maxitrol 3.5mg /g-10,00 unit/ g-0.1% eye ointment Apply to the right eye twice daily as directed Triamcinolone 0.1% Cream Apply to affected area twice daily  Patient needs refills for : Maxitrol  3.5mg /g-10,00 unit/ g-0.1% eye ointment Apply to the right eye twice daily as directed  Confirmed delivery date of 01-17-20, advised patient that pharmacy will contact them the morning of delivery.  Aloha Gell ,CMA Clinical Pharmacist Assistant [phone]  Follow-Up:  Pharmacist Review

## 2020-01-20 DIAGNOSIS — Z23 Encounter for immunization: Secondary | ICD-10-CM | POA: Diagnosis not present

## 2020-02-08 ENCOUNTER — Telehealth: Payer: Self-pay

## 2020-02-08 NOTE — Progress Notes (Signed)
Chronic Care Management Pharmacy Assistant   Name: Kristen Manning  MRN: [id number] DOB: 06-27-1932  Reason for Encounter: Medication Review  PCP : Midge Minium, MD  Allergies:   Allergies  Allergen Reactions  . Aspirin Other (See Comments)    Noted caused 2 holes in retina, not sure   . Codeine Hives  . Statins     Intolerance to statins  . Tizanidine Hcl Other (See Comments)    Confusion    Medications: Outpatient Encounter Medications as of 02/08/2020  Medication Sig  . acetaminophen (TYLENOL) 500 MG tablet Take 1 tablet (500 mg total) by mouth every 8 (eight) hours as needed for moderate pain.  [name] allopurinol (ZYLOPRIM) 300 MG tablet Take 300 mg by mouth as needed.   [name] amoxicillin (AMOXIL) 875 MG tablet Take 1 tablet (875 mg total) by mouth 2 (two) times daily. (Patient not taking: Reported on 11/09/2019)  . b complex vitamins tablet Take 1 tablet by mouth daily.  . Calcium 600-200 MG-UNIT tablet Take 1 tablet by mouth daily.  . Coenzyme Q10 100 MG capsule Take 100 mg by mouth daily.   [name] ezetimibe (ZETIA) 10 MG tablet TAKE ONE TABLET BY MOUTH EVERY MORNING  . fenofibrate 160 MG tablet TAKE ONE TABLET BY MOUTH EVERY MORNING  . fluticasone (FLONASE) 50 MCG/ACT nasal spray Place 2 sprays into both nostrils daily.  . furosemide (LASIX) 40 MG tablet Take 1 tablet (40 mg total) by mouth daily.  [name] levothyroxine (SYNTHROID) 88 MCG tablet TAKE ONE TABLET BY MOUTH BEFORE BREAKFAST  . loratadine (CLARITIN) 10 MG tablet Take 1 tablet (10 mg total) by mouth daily.  [name] MAXITROL 3.5-10000-0.1 OINT Apply 1 Application Both Eyes every evening (Patient not taking: Reported on 11/09/2019)  . NON FORMULARY Take 1 tablet by mouth daily.  [name] omeprazole (PRILOSEC) 20 MG capsule Take 1 capsule (20 mg total) by mouth daily.  . prednisoLONE acetate (PRED FORTE) 1 % ophthalmic suspension 1 drop every morning. (Patient not taking: Reported on 11/09/2019)  . triamcinolone (NASACORT) 55 MCG/ACT  AERO nasal inhaler Place 2 sprays into the nose daily. (Patient not taking: Reported on 11/09/2019)   No facility-administered encounter medications on file as of 02/08/2020.    Current Diagnosis: Patient Active Problem List   Diagnosis Date Noted  . Chronic left shoulder pain 03/10/2019  . Morbid obesity ([place]) 02/11/2018  . DDD (degenerative disc disease), lumbar 10/07/2016  . Primary osteoarthritis of both hands 10/07/2016  . Primary osteoarthritis of both feet 10/07/2016  . Tension-type headache, not intractable 09/16/2016  . GERD (gastroesophageal reflux disease) 09/11/2016  . ANA positive 09/02/2016  . History of total knee replacement, bilateral 03/20/2015  . Hypothyroidism 09/21/2014  . Gout 06/20/2014  . Lower leg edema 06/13/2014  . Meningioma ([place]) 10/07/2013  . Chronic tension-type headache, intractable 10/07/2013  . Depression 06/23/2013  . Insomnia 05/26/2013  . Routine general medical examination at a health care facility 10/17/2011  . HTN (hypertension) 07/03/2011  . Hyperlipidemia 07/03/2011  . Venous insufficiency, peripheral 07/03/2011     Reviewed chart for medication changes ahead of medication coordination call.  No OVs, Consults, or hospital visits since last care coordination call/Pharmacist visit. (If appropriate, list visit date, provider name)  No medication changes indicated OR if recent visit, treatment plan here.  BP Readings from Last 3 Encounters:  11/09/19 138/80  08/27/19 138/70  02/09/19 136/80    No results found for: HGBA1C   Patient obtains medications through Adherence  Packaging  30 Days    Patient is due for next adherence delivery on: 02-14-20. Called patient and reviewed medications and coordinated delivery.  This delivery to include: Triamcinolone Cream 0.1% Apply to affected areas twice daily   Confirmed delivery date of 02-14-20, advised patient that pharmacy will contact them the morning of delivery.   Georgiana Shore  ,[place] Pharmacist Assistant [phone]   Follow-Up:  Pharmacist Review

## 2020-03-03 ENCOUNTER — Telehealth: Payer: Self-pay

## 2020-03-03 NOTE — Chronic Care Management (AMB) (Signed)
    Chronic Care Management Pharmacy Assistant   Name: Kristen Manning  MRN: [id number] DOB: 18-Jul-1932  Reason for Encounter: Chart Review   PCP : Midge Minium, MD  Allergies:   Allergies  Allergen Reactions  . Aspirin Other (See Comments)    Noted caused 2 holes in retina, not sure   . Codeine Hives  . Statins     Intolerance to statins  . Tizanidine Hcl Other (See Comments)    Confusion    Medications: Outpatient Encounter Medications as of 03/03/2020  Medication Sig  . acetaminophen (TYLENOL) 500 MG tablet Take 1 tablet (500 mg total) by mouth every 8 (eight) hours as needed for moderate pain.  [name] allopurinol (ZYLOPRIM) 300 MG tablet Take 300 mg by mouth as needed.   [name] amoxicillin (AMOXIL) 875 MG tablet Take 1 tablet (875 mg total) by mouth 2 (two) times daily. (Patient not taking: Reported on 11/09/2019)  . b complex vitamins tablet Take 1 tablet by mouth daily.  . Calcium 600-200 MG-UNIT tablet Take 1 tablet by mouth daily.  . Coenzyme Q10 100 MG capsule Take 100 mg by mouth daily.   [name] ezetimibe (ZETIA) 10 MG tablet TAKE ONE TABLET BY MOUTH EVERY MORNING  . fenofibrate 160 MG tablet TAKE ONE TABLET BY MOUTH EVERY MORNING  . fluticasone (FLONASE) 50 MCG/ACT nasal spray Place 2 sprays into both nostrils daily.  . furosemide (LASIX) 40 MG tablet Take 1 tablet (40 mg total) by mouth daily.  [name] levothyroxine (SYNTHROID) 88 MCG tablet TAKE ONE TABLET BY MOUTH BEFORE BREAKFAST  . loratadine (CLARITIN) 10 MG tablet Take 1 tablet (10 mg total) by mouth daily.  [name] MAXITROL 3.5-10000-0.1 OINT Apply 1 Application Both Eyes every evening (Patient not taking: Reported on 11/09/2019)  . NON FORMULARY Take 1 tablet by mouth daily.  [name] omeprazole (PRILOSEC) 20 MG capsule Take 1 capsule (20 mg total) by mouth daily.  . prednisoLONE acetate (PRED FORTE) 1 % ophthalmic suspension 1 drop every morning. (Patient not taking: Reported on 11/09/2019)  . triamcinolone (NASACORT) 55 MCG/ACT AERO  nasal inhaler Place 2 sprays into the nose daily. (Patient not taking: Reported on 11/09/2019)   No facility-administered encounter medications on file as of 03/03/2020.    Current Diagnosis: Patient Active Problem List   Diagnosis Date Noted  . Chronic left shoulder pain 03/10/2019  . Morbid obesity ([place]) 02/11/2018  . DDD (degenerative disc disease), lumbar 10/07/2016  . Primary osteoarthritis of both hands 10/07/2016  . Primary osteoarthritis of both feet 10/07/2016  . Tension-type headache, not intractable 09/16/2016  . GERD (gastroesophageal reflux disease) 09/11/2016  . ANA positive 09/02/2016  . History of total knee replacement, bilateral 03/20/2015  . Hypothyroidism 09/21/2014  . Gout 06/20/2014  . Lower leg edema 06/13/2014  . Meningioma ([place]) 10/07/2013  . Chronic tension-type headache, intractable 10/07/2013  . Depression 06/23/2013  . Insomnia 05/26/2013  . Routine general medical examination at a health care facility 10/17/2011  . HTN (hypertension) 07/03/2011  . Hyperlipidemia 07/03/2011  . Venous insufficiency, peripheral 07/03/2011    Reviewed chart for medication changes. No OVs, Consults, or hospital visits since last care coordination call/Pharmacist visit. No medication changes indicated.  Future Appointments  Date Time Provider [place]  05/08/2020  8:30 AM Midge Minium, MD [facility]   April D Calhoun, [place] Pharmacist Assistant [phone]   Follow-Up:  Pharmacist Review

## 2020-03-05 ENCOUNTER — Other Ambulatory Visit: Payer: Self-pay | Admitting: Family Medicine

## 2020-03-06 ENCOUNTER — Telehealth: Payer: Self-pay

## 2020-03-06 ENCOUNTER — Other Ambulatory Visit: Payer: Self-pay

## 2020-03-06 NOTE — Progress Notes (Signed)
Chronic Care Management Pharmacy Assistant   Name: GRACELEE STEMMLER  MRN: [id number] DOB: 1932/07/21  Reason for Encounter: Medication Review  PCP : Midge Minium, MD  Allergies:   Allergies  Allergen Reactions  . Aspirin Other (See Comments)    Noted caused 2 holes in retina, not sure   . Codeine Hives  . Statins     Intolerance to statins  . Tizanidine Hcl Other (See Comments)    Confusion    Medications: Outpatient Encounter Medications as of 03/06/2020  Medication Sig  . acetaminophen (TYLENOL) 500 MG tablet Take 1 tablet (500 mg total) by mouth every 8 (eight) hours as needed for moderate pain.  [name] allopurinol (ZYLOPRIM) 300 MG tablet Take 300 mg by mouth as needed.   [name] amoxicillin (AMOXIL) 875 MG tablet Take 1 tablet (875 mg total) by mouth 2 (two) times daily. (Patient not taking: Reported on 11/09/2019)  . b complex vitamins tablet Take 1 tablet by mouth daily.  . Calcium 600-200 MG-UNIT tablet Take 1 tablet by mouth daily.  . Coenzyme Q10 100 MG capsule Take 100 mg by mouth daily.   [name] ezetimibe (ZETIA) 10 MG tablet TAKE ONE TABLET BY MOUTH EVERY MORNING  . fenofibrate 160 MG tablet TAKE ONE TABLET BY MOUTH EVERY MORNING  . fluticasone (FLONASE) 50 MCG/ACT nasal spray Place 2 sprays into both nostrils daily.  . furosemide (LASIX) 40 MG tablet Take 1 tablet (40 mg total) by mouth daily.  [name] levothyroxine (SYNTHROID) 88 MCG tablet TAKE ONE TABLET BY MOUTH BEFORE BREAKFAST  . loratadine (CLARITIN) 10 MG tablet Take 1 tablet (10 mg total) by mouth daily.  [name] MAXITROL 3.5-10000-0.1 OINT Apply 1 Application Both Eyes every evening (Patient not taking: Reported on 11/09/2019)  . NON FORMULARY Take 1 tablet by mouth daily.  [name] omeprazole (PRILOSEC) 20 MG capsule TAKE ONE CAPSULE BY MOUTH EVERY MORNING  . prednisoLONE acetate (PRED FORTE) 1 % ophthalmic suspension 1 drop every morning. (Patient not taking: Reported on 11/09/2019)  . triamcinolone (NASACORT) 55 MCG/ACT AERO  nasal inhaler Place 2 sprays into the nose daily. (Patient not taking: Reported on 11/09/2019)   No facility-administered encounter medications on file as of 03/06/2020.    Current Diagnosis: Patient Active Problem List   Diagnosis Date Noted  . Chronic left shoulder pain 03/10/2019  . Morbid obesity ([place]) 02/11/2018  . DDD (degenerative disc disease), lumbar 10/07/2016  . Primary osteoarthritis of both hands 10/07/2016  . Primary osteoarthritis of both feet 10/07/2016  . Tension-type headache, not intractable 09/16/2016  . GERD (gastroesophageal reflux disease) 09/11/2016  . ANA positive 09/02/2016  . History of total knee replacement, bilateral 03/20/2015  . Hypothyroidism 09/21/2014  . Gout 06/20/2014  . Lower leg edema 06/13/2014  . Meningioma ([place]) 10/07/2013  . Chronic tension-type headache, intractable 10/07/2013  . Depression 06/23/2013  . Insomnia 05/26/2013  . Routine general medical examination at a health care facility 10/17/2011  . HTN (hypertension) 07/03/2011  . Hyperlipidemia 07/03/2011  . Venous insufficiency, peripheral 07/03/2011    Reviewed chart for medication changes ahead of medication coordination call.  No OVs, Consults, or hospital visits since last care coordination call/Pharmacist visit. (If appropriate, list visit date, provider name)  No medication changes indicated OR if recent visit, treatment plan here.  BP Readings from Last 3 Encounters:  11/09/19 138/80  08/27/19 138/70  02/09/19 136/80    No results found for: HGBA1C   Patient obtains medications through Adherence Packaging  90  Days   Last adherence delivery included:  Triamcinolone 0.1% Apply to afected  Patient is due for next adherence delivery on: 03-16-20. Called patient and reviewed medications and coordinated delivery.  This delivery to include: Omeprazole 20mg  Take one capsule every morning Ezetimibe 10mg  Take one tablet every morning Fenofibrate 160mg  Take one tablet  every morning Furosemide 40mg  Tab Take one tablet every morning Levothyroxin 82mcg Take one tab before breakfast    Confirmed delivery date of 03-16-20, advised patient that pharmacy will contact them the morning of delivery.  Georgiana Shore ,[place] Pharmacist Assistant [phone]  Follow-Up:  Pharmacist Review

## 2020-03-17 DIAGNOSIS — M19011 Primary osteoarthritis, right shoulder: Secondary | ICD-10-CM | POA: Diagnosis not present

## 2020-03-17 DIAGNOSIS — M19012 Primary osteoarthritis, left shoulder: Secondary | ICD-10-CM | POA: Diagnosis not present

## 2020-03-17 DIAGNOSIS — M25712 Osteophyte, left shoulder: Secondary | ICD-10-CM | POA: Diagnosis not present

## 2020-03-20 ENCOUNTER — Other Ambulatory Visit: Payer: Self-pay

## 2020-03-20 ENCOUNTER — Emergency Department (HOSPITAL_BASED_OUTPATIENT_CLINIC_OR_DEPARTMENT_OTHER): Payer: Medicare Other

## 2020-03-20 ENCOUNTER — Emergency Department (HOSPITAL_BASED_OUTPATIENT_CLINIC_OR_DEPARTMENT_OTHER)
Admission: EM | Admit: 2020-03-20 | Discharge: 2020-03-20 | Disposition: A | Discharge status: Home / Self Care | Payer: Medicare Other | Attending: Emergency Medicine | Admitting: Emergency Medicine

## 2020-03-20 ENCOUNTER — Encounter (HOSPITAL_BASED_OUTPATIENT_CLINIC_OR_DEPARTMENT_OTHER): Payer: Self-pay

## 2020-03-20 DIAGNOSIS — M25511 Pain in right shoulder: Secondary | ICD-10-CM | POA: Diagnosis not present

## 2020-03-20 DIAGNOSIS — S20219A Contusion of unspecified front wall of thorax, initial encounter: Secondary | ICD-10-CM | POA: Diagnosis not present

## 2020-03-20 DIAGNOSIS — Z96653 Presence of artificial knee joint, bilateral: Secondary | ICD-10-CM | POA: Diagnosis not present

## 2020-03-20 DIAGNOSIS — I10 Essential (primary) hypertension: Secondary | ICD-10-CM | POA: Insufficient documentation

## 2020-03-20 DIAGNOSIS — S20211A Contusion of right front wall of thorax, initial encounter: Secondary | ICD-10-CM | POA: Diagnosis not present

## 2020-03-20 DIAGNOSIS — W01198A Fall on same level from slipping, tripping and stumbling with subsequent striking against other object, initial encounter: Secondary | ICD-10-CM | POA: Insufficient documentation

## 2020-03-20 DIAGNOSIS — E039 Hypothyroidism, unspecified: Secondary | ICD-10-CM | POA: Diagnosis not present

## 2020-03-20 DIAGNOSIS — Y9301 Activity, walking, marching and hiking: Secondary | ICD-10-CM | POA: Insufficient documentation

## 2020-03-20 DIAGNOSIS — M47814 Spondylosis without myelopathy or radiculopathy, thoracic region: Secondary | ICD-10-CM | POA: Diagnosis not present

## 2020-03-20 DIAGNOSIS — Z85828 Personal history of other malignant neoplasm of skin: Secondary | ICD-10-CM | POA: Insufficient documentation

## 2020-03-20 DIAGNOSIS — S299XXA Unspecified injury of thorax, initial encounter: Secondary | ICD-10-CM | POA: Diagnosis present

## 2020-03-20 DIAGNOSIS — Z79899 Other long term (current) drug therapy: Secondary | ICD-10-CM | POA: Diagnosis not present

## 2020-03-20 DIAGNOSIS — W19XXXA Unspecified fall, initial encounter: Secondary | ICD-10-CM

## 2020-03-20 DIAGNOSIS — M19011 Primary osteoarthritis, right shoulder: Secondary | ICD-10-CM | POA: Diagnosis not present

## 2020-03-20 MED ORDER — ACETAMINOPHEN 500 MG PO TABS
1000.0000 mg | ORAL_TABLET | Freq: Once | ORAL | Status: AC
  Administered 2020-03-20: 1000 mg via ORAL
  Filled 2020-03-20: qty 2

## 2020-03-20 MED ORDER — METHOCARBAMOL 500 MG PO TABS: 500.0000 mg | ORAL_TABLET | Freq: Two times a day (BID) | ORAL | 0 refills | Status: AC

## 2020-03-20 MED ORDER — ACETAMINOPHEN 500 MG PO TABS: 1000.0000 mg | ORAL_TABLET | Freq: Four times a day (QID) | ORAL | 0 refills | Status: AC | PRN

## 2020-03-20 MED ORDER — LIDOCAINE 5 % EX PTCH
1.0000 | MEDICATED_PATCH | CUTANEOUS | Status: DC
  Administered 2020-03-20: 1 via TRANSDERMAL
  Filled 2020-03-20: qty 1

## 2020-03-20 NOTE — Discharge Instructions (Addendum)
Your examination today is most concerning for a muscular injury  You do also have bruising to your ribs likely a rib contusion -- please use incentive spirometer.  Please do range of motion exercises such as as we discussed including drawing a large circle with your elbow and moving your hand towards your left chest and then outwards to preserve the range of motion of your arm. 1. Medications: I have prescribed you a short course of Robaxin to use at nighttime as can cause drowsiness and dizziness so please be careful with it. 2. Treatment: rest, ice, elevate and use an ACE wrap or other compressive therapy to decrease swelling. Also drink plenty of fluids and do plenty of gentle stretching and move the affected muscle through its normal range of motion to prevent stiffness. 3. Follow Up: If your symptoms do not improve please follow up with orthopedics/sports medicine or your PCP for discussion of your diagnoses and further evaluation after today's visit; if you do not have a primary care doctor use the resource guide provided to find one; Please return to the ER for worsening symptoms or other concerns.    I have also provided information for appropriate orthopedist in [facility].  Please take care and I hope your symptoms improve.

## 2020-03-20 NOTE — ED Triage Notes (Addendum)
Pt sates she fell 3/9-c/o pain to right shoulder and right mid back/bruising-pt states she was seen at ortho office-states she did have an xray of right shoulder and was told she had a "torn rotator cuff"-pt states pain is worse to right shoulder-states bruising to mid back was not addressed at visit-to triage in w/c-NAD-pt states she feels she needs another shoulder xray and is agreeable to rib/CXR fro pain/brusing site

## 2020-03-20 NOTE — ED Notes (Signed)
Safety measures in place, sr x 2 up, call bell within reach also HIGH FALL RISK bracelet in place.

## 2020-03-20 NOTE — ED Provider Notes (Signed)
[place] EMERGENCY DEPARTMENT Provider Note   CSN: [id number] Arrival date & time: 03/20/20  1140     History Chief Complaint  Patient presents with  . Fall    Kristen Manning is a 87 y.o. female.  HPI Patient is an 87 year old female presented today with symptoms of right shoulder pain and right side pain.  She states that she fell this past Wednesday 6 days ago when she was walking on her shag rug and her foot slipped.  She states that she fell sideways and hit her right rib cage against the ground.  She states that she had just gotten out of bed.  She states that during the episode she did not have a chest pain, shortness of breath or dizziness.  She states that he did not hit her head or lose consciousness.  She does not take any blood thinners.  She denies any other associated symptoms.  She states that she was seen after the fall at her orthopedics office however she felt that she was not taken seriously by her orthopedics and came to the ER today for continued pain and reevaluation.  No new injury since the fall last week.  She denies any current symptoms apart from right shoulder pain and right side pain.  She states that she has a somewhat improving bruise over her right rib cage.  Denies any shortness of breath.  No fevers or chills.     Past Medical History:  Diagnosis Date  . Arthritis   . Bacterial infection    in lungs in Jan 2015  . Bilateral lower extremity edema   . Cancer ([place])    skin cancer, back, legs melonama, sees Dr Lisabeth Pick, derm  . Chronic back pain   . GERD (gastroesophageal reflux disease)    takes Omeprazole daily  . Headache(784.0)   . History of migraine    last one about 15 yrs ago  . Hyperlipidemia    takes Zetia daily  . Hypertension    takes HCTZ daily  . Joint pain   . Joint swelling   . Myocardial infarction [facility])    pt states EKG always shows infarct but no change from previous ekg;never knew anything about it  .  Nocturia   . Osteoarthritis   . Pneumonia    hx of-many yrs ago  . Pneumonia   . PONV (postoperative nausea and vomiting)   . Shortness of breath    with exertion  . Urinary frequency   . Urinary incontinence   . Urinary urgency     Patient Active Problem List   Diagnosis Date Noted  . Chronic left shoulder pain 03/10/2019  . Morbid obesity ([place]) 02/11/2018  . DDD (degenerative disc disease), lumbar 10/07/2016  . Primary osteoarthritis of both hands 10/07/2016  . Primary osteoarthritis of both feet 10/07/2016  . Tension-type headache, not intractable 09/16/2016  . GERD (gastroesophageal reflux disease) 09/11/2016  . ANA positive 09/02/2016  . History of total knee replacement, bilateral 03/20/2015  . Hypothyroidism 09/21/2014  . Gout 06/20/2014  . Lower leg edema 06/13/2014  . Meningioma ([place]) 10/07/2013  . Chronic tension-type headache, intractable 10/07/2013  . Depression 06/23/2013  . Insomnia 05/26/2013  . Routine general medical examination at a health care facility 10/17/2011  . HTN (hypertension) 07/03/2011  . Hyperlipidemia 07/03/2011  . Venous insufficiency, peripheral 07/03/2011    Past Surgical History:  Procedure Laterality Date  . bladder tack  1998  . BLEPHAROPLASTY Bilateral   .  JOINT REPLACEMENT Left 10/02/2009  . JOINT REPLACEMENT Right 02-22-13   Knee  . KNEE SURGERY Bilateral 2009 and 2011  . OOPHORECTOMY  1998   only one  . TONSILLECTOMY  as a child ? date  . TOTAL KNEE ARTHROPLASTY Right 02/22/2013   DR Ronnie Derby  . TOTAL KNEE ARTHROPLASTY Right 02/22/2013   Procedure: TOTAL KNEE ARTHROPLASTY;  Surgeon: Vickey Huger, MD;  Location: [facility];  Service: Orthopedics;  Laterality: Right;     OB History   No obstetric history on file.     Family History  Problem Relation Age of Onset  . Kidney disease Mother   . Heart disease Mother   . Hypertension Mother   . Varicose Veins Mother   . Kidney failure Mother   . Alcohol abuse Father   . Stroke  Father        several   . Hypertension Father   . Varicose Veins Father   . Alcohol abuse Brother   . Hyperlipidemia Brother   . Hypertension Brother   . Early death Brother   . Varicose Veins Brother   . Heart disease Brother   . Hypertension Brother   . Arthritis Brother        Gout  . Varicose Veins Son   . Deep vein thrombosis Son   . Breast cancer Maternal Aunt     Social History   Tobacco Use  . Smoking status: Never Smoker  . Smokeless tobacco: Never Used  Vaping Use  . Vaping Use: Never used  Substance Use Topics  . Alcohol use: No  . Drug use: No    Home Medications Prior to Admission medications   Medication Sig Start Date End Date Taking? Authorizing Provider  acetaminophen (TYLENOL) 500 MG tablet Take 2 tablets (1,000 mg total) by mouth every 6 (six) hours as needed. 03/20/20  Yes Fondaw, Wylder S, PA  methocarbamol (ROBAXIN) 500 MG tablet Take 1 tablet (500 mg total) by mouth 2 (two) times daily for 5 days. 03/20/20 03/25/20 Yes Fondaw, Wylder S, PA  allopurinol (ZYLOPRIM) 300 MG tablet Take 300 mg by mouth as needed.  06/27/15   [provider]  b complex vitamins tablet Take 1 tablet by mouth daily.    [provider]  Calcium 600-200 MG-UNIT tablet Take 1 tablet by mouth daily.    [provider]  Coenzyme Q10 100 MG capsule Take 100 mg by mouth daily.     [provider]  ezetimibe (ZETIA) 10 MG tablet TAKE ONE TABLET BY MOUTH EVERY MORNING 03/06/20   Midge Minium, MD  fenofibrate 160 MG tablet TAKE ONE TABLET BY MOUTH EVERY MORNING 03/06/20   Midge Minium, MD  fluticasone (FLONASE) 50 MCG/ACT nasal spray Place 2 sprays into both nostrils daily.    [provider]  furosemide (LASIX) 40 MG tablet Take 1 tablet (40 mg total) by mouth daily. 09/17/19   Lorretta Harp, MD  levothyroxine (SYNTHROID) 88 MCG tablet TAKE ONE TABLET BY MOUTH BEFORE BREAKFAST 12/16/19   Midge Minium, MD  loratadine  (CLARITIN) 10 MG tablet Take 1 tablet (10 mg total) by mouth daily. 01/16/17   Midge Minium, MD  MAXITROL 3.5-10000-0.1 OINT Apply 1 Application Both Eyes every evening Patient not taking: Reported on 11/09/2019 12/25/18   [provider]  NON FORMULARY Take 1 tablet by mouth daily.    [provider]  omeprazole (PRILOSEC) 20 MG capsule TAKE ONE CAPSULE BY MOUTH EVERY  MORNING 03/06/20   Midge Minium, MD  prednisoLONE acetate (PRED FORTE) 1 % ophthalmic suspension 1 drop every morning. Patient not taking: Reported on 11/09/2019 12/28/18   [provider]  triamcinolone (KENALOG) 0.1 % Apply topically 2 (two) times daily. 02/10/20   [provider]  triamcinolone (NASACORT) 55 MCG/ACT AERO nasal inhaler Place 2 sprays into the nose daily. Patient not taking: Reported on 11/09/2019 11/17/17   Jacelyn Pi, Lilia Argue, MD    Allergies    Aspirin, Codeine, Statins, and Tizanidine hcl  Review of Systems   Review of Systems  Constitutional: Negative for chills and fever.  HENT: Negative for congestion.   Eyes: Negative for pain.  Respiratory: Negative for cough and shortness of breath.   Cardiovascular: Negative for chest pain and leg swelling.  Gastrointestinal: Negative for abdominal pain and vomiting.  Genitourinary: Negative for dysuria.  Musculoskeletal: Negative for myalgias.       Right shoulder pain, right side pain  Skin: Negative for rash.  Neurological: Negative for dizziness and headaches.    Physical Exam Updated Vital Signs BP (!) 161/91   Pulse (!) 59   Temp 97.9 F (36.6 C) (Oral)   Resp (!) 22   Ht 5\' 5"  (1.651 m)   Wt 95.3 kg   SpO2 96%   BMI 34.95 kg/m   Physical Exam Vitals and nursing note reviewed.  Constitutional:      General: She is not in acute distress. HENT:     Head: Normocephalic and atraumatic.     Nose: Nose normal.  Eyes:     General: No scleral icterus. Cardiovascular:     Rate and Rhythm: Normal  rate and regular rhythm.     Pulses: Normal pulses.     Heart sounds: Normal heart sounds.  Pulmonary:     Effort: Pulmonary effort is normal. No respiratory distress.     Breath sounds: No wheezing.  Abdominal:     Palpations: Abdomen is soft.     Tenderness: There is no abdominal tenderness.  Musculoskeletal:     Cervical back: Normal range of motion.     Right lower leg: No edema.     Left lower leg: No edema.     Comments: FROM of shoulder (R) mild diffuse TTP of AC joint and prox humerus.   Skin:    General: Skin is warm and dry.     Capillary Refill: Capillary refill takes less than 2 seconds.     Comments: Approximately 16 x 8 cm area of bruising to the right rib cage.  Some tenderness to palpation here no crepitus or focal tenderness to palpation.  Neurological:     Mental Status: She is alert. Mental status is at baseline.  Psychiatric:        Mood and Affect: Mood normal.        Behavior: Behavior normal.     ED Results / Procedures / Treatments   Labs (all labs ordered are listed, but only abnormal results are displayed) Labs Reviewed - No data to display  EKG None  Radiology DG Ribs Unilateral W/Chest Right  Result Date: 03/20/2020 CLINICAL DATA:  Persistent right mid back pain and bruising since a fall 1 week ago. EXAM: RIGHT RIBS AND CHEST - 3+ VIEW COMPARISON:  Chest radiograph September 06, 2016. FINDINGS: No fracture or other bone lesions are seen involving the ribs. There is no evidence of pneumothorax or pleural effusion. Both lungs are clear. Heart size is within  normal limits. Thoracic spondylosis. Degenerative changes bilateral shoulders. IMPRESSION: No evidence of displaced right-sided rib fracture. Electronically Signed   By: Dahlia Bailiff MD   On: 03/20/2020 13:19   DG Shoulder Right  Result Date: 03/20/2020 CLINICAL DATA:  Right shoulder pain after recent fall. EXAM: RIGHT SHOULDER - 2+ VIEW COMPARISON:  None. FINDINGS: There is no evidence of fracture  or dislocation. Moderate degenerative changes seen involving the right glenohumeral joint as well as the right acromioclavicular joint. Mild narrowing of subacromial space is noted suggesting rotator cuff injury. Soft tissues are unremarkable. IMPRESSION: Moderate degenerative joint disease of the right glenohumeral and acromioclavicular joints. Mild narrowing of subacromial space is noted suggesting chronic rotator cuff injury. No acute abnormality seen in the right shoulder. Electronically Signed   By: Marijo Conception M.D.   On: 03/20/2020 13:24    Procedures Procedures   Medications Ordered in ED Medications  lidocaine (LIDODERM) 5 % 1 patch (1 patch Transdermal Patch Applied 03/20/20 1540)  acetaminophen (TYLENOL) tablet 1,000 mg (1,000 mg Oral Given 03/20/20 1539)    ED Course  I have reviewed the triage vital signs and the nursing notes.  Pertinent labs & imaging results that were available during my care of the patient were reviewed by me and considered in my medical decision making (see chart for details).    MDM Rules/Calculators/A&P                          Patient is 87 year old female presented today 1 week after fall has already been evaluated by orthopedic PA however requesting reevaluation today.  Provided patient with Lidoderm patch and Tylenol.  Right shoulder x-ray with evidence of chronic rotator cuff injury.  No acute fractures.  This is relatively unchanged from prior x-ray as discussed b hey orthopedics.  Chest x-ray with right ribs negative for any obvious fracture.  Will place order for incentive spirometer to be provided to patient and discharged with instructions.  Understands plan to call follow-up with orthopedics.  Patient requested new orthopedist recommendation provided with [facility]. Will use Tylenol for pain and Robaxin only at nighttime and under careful supervision by her brother who lives with her.  She understands fall risks.  Blood pressure  elevated but not severely so.  No symptoms of hypertensive urgency/emergency.  Patient discharged at this time. Some improvement with Tylenol and Lidoderm patch  Final Clinical Impression(s) / ED Diagnoses Final diagnoses:  Acute pain of right shoulder  Fall, initial encounter  Contusion of rib on right side, initial encounter    Rx / DC Orders ED Discharge Orders         Ordered    methocarbamol (ROBAXIN) 500 MG tablet  2 times daily        03/20/20 1552    acetaminophen (TYLENOL) 500 MG tablet  Every 6 hours PRN        03/20/20 1552           Tedd Sias, [place] 03/20/20 1716    Truddie Hidden, MD 03/20/20 2259

## 2020-03-20 NOTE — ED Notes (Signed)
Fell last week, was at home, just had gotten up from bed. Denies having chest pain, sob or being dizzy. States the carped is very slippery at times. Fell onto rt side and has pain at rt underarm, no bruising noted or other discoloration was noted. Denies hitting head, no LOC and denies taking any blood thinners

## 2020-03-20 NOTE — ED Notes (Signed)
IS given to pt - teaching completed with pt demonstration

## 2020-04-07 ENCOUNTER — Telehealth: Payer: Self-pay

## 2020-04-07 NOTE — Chronic Care Management (AMB) (Signed)
    Chronic Care Management Pharmacy Assistant   Name: Kristen Manning  MRN: [id number] DOB: Apr 27, 1932   Reason for Encounter: Medication Review  Recent office visits:    Recent consult visits:  03/17/2020 OV (orthopedic) Carlyon Shadow, PA-C;   Hospital visits:  Medication Reconciliation was completed by comparing discharge summary, patient's EMR and Pharmacy list, and upon discussion with patient.  ED Visit on 03/20/2020 due to Fall/Right shoulder pain.   New?Medications Started at ED Discharge:?? -started Robaxin 500 mg bid and Tylenol 500 mg q 6hrs prn.  Medications that remain the same after Hospital Discharge:??  -All other medications will remain the same.    Medications: Outpatient Encounter Medications as of 04/07/2020  Medication Sig  . acetaminophen (TYLENOL) 500 MG tablet Take 2 tablets (1,000 mg total) by mouth every 6 (six) hours as needed.  [name] allopurinol (ZYLOPRIM) 300 MG tablet Take 300 mg by mouth as needed.   [name] b complex vitamins tablet Take 1 tablet by mouth daily.  . Calcium 600-200 MG-UNIT tablet Take 1 tablet by mouth daily.  . Coenzyme Q10 100 MG capsule Take 100 mg by mouth daily.   [name] ezetimibe (ZETIA) 10 MG tablet TAKE ONE TABLET BY MOUTH EVERY MORNING  . fenofibrate 160 MG tablet TAKE ONE TABLET BY MOUTH EVERY MORNING  . fluticasone (FLONASE) 50 MCG/ACT nasal spray Place 2 sprays into both nostrils daily.  . furosemide (LASIX) 40 MG tablet Take 1 tablet (40 mg total) by mouth daily.  [name] levothyroxine (SYNTHROID) 88 MCG tablet TAKE ONE TABLET BY MOUTH BEFORE BREAKFAST  . loratadine (CLARITIN) 10 MG tablet Take 1 tablet (10 mg total) by mouth daily.  [name] MAXITROL 3.5-10000-0.1 OINT Apply 1 Application Both Eyes every evening (Patient not taking: Reported on 11/09/2019)  . NON FORMULARY Take 1 tablet by mouth daily.  [name] omeprazole (PRILOSEC) 20 MG capsule TAKE ONE CAPSULE BY MOUTH EVERY MORNING  . prednisoLONE acetate (PRED FORTE) 1 % ophthalmic suspension 1  drop every morning. (Patient not taking: Reported on 11/09/2019)  . triamcinolone (KENALOG) 0.1 % Apply topically 2 (two) times daily.  [name] triamcinolone (NASACORT) 55 MCG/ACT AERO nasal inhaler Place 2 sprays into the nose daily. (Patient not taking: Reported on 11/09/2019)   No facility-administered encounter medications on file as of 04/07/2020.    Reviewed chart for medication changes ahead of medication coordination call.   BP Readings from Last 3 Encounters:  03/20/20 (!) 161/91  11/09/19 138/80  08/27/19 138/70    No results found for: HGBA1C   Patient obtains medications through Adherence Packaging  90 Days   Last adherence delivery included:  Ezetimibe 20 mg Furosemide 40 mg Fenofibrate 160 mg Levothyroxine 85 mcg Omeprazole 20 mg  Patient is due for next adherence delivery on: 06/14/2020. Called patient and reviewed medications.  Confirmed with patient no delivery needed. All medications were filled 03/14/2020 for 90 DS.  April D Calhoun, [place] Pharmacist Assistant [phone]

## 2020-04-26 ENCOUNTER — Other Ambulatory Visit: Payer: Self-pay

## 2020-04-26 ENCOUNTER — Ambulatory Visit (INDEPENDENT_AMBULATORY_CARE_PROVIDER_SITE_OTHER): Payer: Medicare Other | Admitting: Family Medicine

## 2020-04-26 ENCOUNTER — Encounter: Payer: Self-pay | Admitting: Family Medicine

## 2020-04-26 VITALS — BP 130/82 | HR 96 | Temp 97.5°F | Resp 18 | Ht 65.0 in | Wt 210.8 lb

## 2020-04-26 DIAGNOSIS — K5900 Constipation, unspecified: Secondary | ICD-10-CM

## 2020-04-26 NOTE — Patient Instructions (Signed)
Follow up as scheduled or as needed No need for labs today- yay! Start taking 1 capful of Miralax daily.  If this is too much (too frequent or diarrhea) decrease to 1/2 cap daily Continue to drink LOTS of water Call with any questions or concerns [place]!!!

## 2020-04-26 NOTE — Progress Notes (Signed)
   Subjective:    Patient ID: Kristen Manning, female    DOB: 1932/04/01, 87 y.o.   MRN: [id number]  HPI 'my stomach's not doing right'- pt reports last week she 'was really afraid that I had a blockage'.  Unable to have a BM starting mid-week.  Added Miralax w/ some relief.  Sunday had a large BM that had blood 'on the side of it'.  No blood since.  Took Miralax Monday- had several, small stool balls.  Took Miralax again last night- had 'a really good BM this morning'.  On subsequent BM this AM again had multiple stool balls.  Pt admits that she had poor water intake until last week when she has made an effort to drink more.  Pt is eating fruit, rare vegetables- bacon and eggs for breakfast, has soup or sandwich for lunch, then has sandwich or cereal for dinner.  Reports pinto beans, broccoli (once a week). High stress levels.   Review of Systems For ROS see HPI   This visit occurred during the SARS-CoV-2 public health emergency.  Safety protocols were in place, including screening questions prior to the visit, additional usage of staff PPE, and extensive cleaning of exam room while observing appropriate contact time as indicated for disinfecting solutions.       Objective:   Physical Exam Vitals reviewed.  Constitutional:      General: She is not in acute distress.    Appearance: Normal appearance. She is obese. She is not ill-appearing.  HENT:     Head: Normocephalic and atraumatic.  Abdominal:     General: Abdomen is flat. There is no distension.     Palpations: Abdomen is soft.     Tenderness: There is no abdominal tenderness. There is no guarding or rebound.  Skin:    General: Skin is warm and dry.  Neurological:     General: No focal deficit present.     Mental Status: She is alert and oriented to person, place, and time.  Psychiatric:        Mood and Affect: Mood normal.        Behavior: Behavior normal.        Thought Content: Thought content normal.            Assessment & Plan:  Constipation- new.  Suspect this is multifactorial.  Pt doesn't drink much water, has very little ruffage/fiber in diet.  She has had quite a bit of stress recently w/ her brother's cancer treatment (they live together).  Reassured her that she does not have a blockage.  Abdominal exam is benign.  No need for labs as she is having BMs w/ use of Miralax.  Encouraged her to use Miralax daily w/ the goal of 1-2 BMs daily.  Pt expressed understanding and is in agreement w/ plan.

## 2020-05-03 ENCOUNTER — Telehealth: Payer: Self-pay | Admitting: Family Medicine

## 2020-05-03 NOTE — Telephone Encounter (Signed)
Left message for patient to schedule Annual Wellness Visit.  Please schedule with Nurse Health Advisor Martha Stanley, RN at [place]  

## 2020-05-08 ENCOUNTER — Encounter: Payer: Self-pay | Admitting: Family Medicine

## 2020-05-08 ENCOUNTER — Ambulatory Visit: Payer: Medicare Other | Admitting: Family Medicine

## 2020-05-08 ENCOUNTER — Telehealth (INDEPENDENT_AMBULATORY_CARE_PROVIDER_SITE_OTHER): Payer: Medicare Other | Admitting: Family Medicine

## 2020-05-08 DIAGNOSIS — J329 Chronic sinusitis, unspecified: Secondary | ICD-10-CM | POA: Diagnosis not present

## 2020-05-08 DIAGNOSIS — B9689 Other specified bacterial agents as the cause of diseases classified elsewhere: Secondary | ICD-10-CM

## 2020-05-08 MED ORDER — AMOXICILLIN 875 MG PO TABS: 875.0000 mg | ORAL_TABLET | Freq: Two times a day (BID) | ORAL | 0 refills | Status: DC

## 2020-05-08 MED ORDER — AMOXICILLIN 875 MG PO TABS: 875.0000 mg | ORAL_TABLET | Freq: Two times a day (BID) | ORAL | 0 refills | Status: AC

## 2020-05-08 NOTE — Progress Notes (Signed)
I connected with  Kristen Manning on 05/08/20 by a video enabled telemedicine application and verified that I am speaking with the correct person using two identifiers.   I discussed the limitations of evaluation and management by telemedicine. The patient expressed understanding and agreed to proceed.

## 2020-05-08 NOTE — Progress Notes (Signed)
Virtual Visit via Video   I connected with patient on 05/08/20 at  8:30 AM EDT by a video enabled telemedicine application and verified that I am speaking with the correct person using two identifiers.  Location patient: Home Location provider: [facility], Office Persons participating in the virtual visit: Patient, Provider, [place] (Sabrina M)  I discussed the limitations of evaluation and management by telemedicine and the availability of in person appointments. The patient expressed understanding and agreed to proceed.  Interactive audio and video telecommunications were attempted between this provider and patient, however failed, due to patient having technical difficulties OR patient did not have access to video capability.  We continued and completed visit with audio only.   Subjective:   HPI:   URI- pt worked in [employer] on Friday.  Saturday developed sinus congestion, HA.  Sunday developed dizziness, diarrhea, sore throat.  Taking Claritin, nasal spray w/o relief.  'I just feel awful'.  No fever.  No known sick contacts.  Dizziness has resolved.  + TTP over frontal and maxillary sinuses.  Pt reports this feels similar to previous sinus infxns.  ROS:   See pertinent positives and negatives per HPI.  Patient Active Problem List   Diagnosis Date Noted  . Chronic left shoulder pain 03/10/2019  . Morbid obesity ([place]) 02/11/2018  . DDD (degenerative disc disease), lumbar 10/07/2016  . Primary osteoarthritis of both hands 10/07/2016  . Primary osteoarthritis of both feet 10/07/2016  . Tension-type headache, not intractable 09/16/2016  . GERD (gastroesophageal reflux disease) 09/11/2016  . ANA positive 09/02/2016  . History of total knee replacement, bilateral 03/20/2015  . Hypothyroidism 09/21/2014  . Gout 06/20/2014  . Lower leg edema 06/13/2014  . Meningioma ([place]) 10/07/2013  . Chronic tension-type headache, intractable 10/07/2013  . Depression 06/23/2013  .  Insomnia 05/26/2013  . Routine general medical examination at a health care facility 10/17/2011  . HTN (hypertension) 07/03/2011  . Hyperlipidemia 07/03/2011  . Venous insufficiency, peripheral 07/03/2011    Social History   Tobacco Use  . Smoking status: Never Smoker  . Smokeless tobacco: Never Used  Substance Use Topics  . Alcohol use: No    Current Outpatient Medications:  .  acetaminophen (TYLENOL) 500 MG tablet, Take 2 tablets (1,000 mg total) by mouth every 6 (six) hours as needed., Disp: 30 tablet, Rfl: 0 .  allopurinol (ZYLOPRIM) 300 MG tablet, Take 300 mg by mouth as needed. , Disp: , Rfl: 0 .  b complex vitamins tablet, Take 1 tablet by mouth daily., Disp: , Rfl:  .  Calcium 600-200 MG-UNIT tablet, Take 1 tablet by mouth daily., Disp: , Rfl:  .  Coenzyme Q10 100 MG capsule, Take 100 mg by mouth daily. , Disp: , Rfl:  .  ezetimibe (ZETIA) 10 MG tablet, TAKE ONE TABLET BY MOUTH EVERY MORNING, Disp: 90 tablet, Rfl: 1 .  fenofibrate 160 MG tablet, TAKE ONE TABLET BY MOUTH EVERY MORNING, Disp: 90 tablet, Rfl: 1 .  fluticasone (FLONASE) 50 MCG/ACT nasal spray, Place 2 sprays into both nostrils daily., Disp: , Rfl:  .  furosemide (LASIX) 40 MG tablet, Take 1 tablet (40 mg total) by mouth daily., Disp: 90 tablet, Rfl: 3 .  levothyroxine (SYNTHROID) 88 MCG tablet, TAKE ONE TABLET BY MOUTH BEFORE BREAKFAST, Disp: 90 tablet, Rfl: 1 .  loratadine (CLARITIN) 10 MG tablet, Take 1 tablet (10 mg total) by mouth daily., Disp: , Rfl:  .  omeprazole (PRILOSEC) 20 MG capsule, TAKE ONE CAPSULE BY  MOUTH EVERY MORNING, Disp: 90 capsule, Rfl: 1 .  triamcinolone (KENALOG) 0.1 %, Apply topically 2 (two) times daily., Disp: , Rfl:  .  MAXITROL 3.5-10000-0.1 OINT, Apply 1 Application Both Eyes every evening (Patient not taking: No sig reported), Disp: , Rfl:  .  NON FORMULARY, Take 1 tablet by mouth daily. (Patient not taking: No sig reported), Disp: , Rfl:  .  prednisoLONE acetate (PRED FORTE) 1 %  ophthalmic suspension, 1 drop every morning. (Patient not taking: No sig reported), Disp: , Rfl:  .  triamcinolone (NASACORT) 55 MCG/ACT AERO nasal inhaler, Place 2 sprays into the nose daily. (Patient not taking: No sig reported), Disp: 1 Inhaler, Rfl: 12  Allergies  Allergen Reactions  . Aspirin Other (See Comments)    Noted caused 2 holes in retina, not sure   . Codeine Hives  . Statins     Intolerance to statins  . Tizanidine Hcl Other (See Comments)    Confusion    Objective:   There were no vitals taken for this visit. Pt is able to speak clearly, coherently without shortness of breath or increased work of breathing.  Thought process is linear.  Mood is appropriate.   Assessment and Plan:   Bacterial sinusitis- pt has hx of similar and typically has infections once allergies are triggered like they were this weekend.  Start Amoxicillin.  Reviewed supportive care.  Pt expressed understanding and is in agreement w/ plan.    Annye Asa, MD 05/08/2020  Time spent with the patient: 7 minutes, of which >50% was spent in obtaining information about symptoms, reviewing previous labs, evaluations, and treatments, counseling about condition (please see the discussed topics above), and developing a plan to further investigate it; had a number of questions which I addressed.

## 2020-06-02 ENCOUNTER — Other Ambulatory Visit: Payer: Self-pay | Admitting: Family Medicine

## 2020-06-07 ENCOUNTER — Telehealth: Payer: Self-pay

## 2020-06-07 NOTE — Chronic Care Management (AMB) (Signed)
    Chronic Care Management Pharmacy Assistant   Name: Kristen Manning  MRN: [id number] DOB: 09-15-1932  Reason for Encounter: Medication Coordination Call  Recent office visits:  05/08/20- Annye Asa, MD ( Video Visit)- seen for URI, short course amoxicillin 875 mg x 10 DS, no follow up documented 04/26/20- Annye Asa, MD- seen for constipation, recommended miralax daily, follow up as scheduled   Recent consult visits:  No visits noted  Hospital visits:  None in previous 6 months  Medications: Outpatient Encounter Medications as of 06/07/2020  Medication Sig  . acetaminophen (TYLENOL) 500 MG tablet Take 2 tablets (1,000 mg total) by mouth every 6 (six) hours as needed.  [name] allopurinol (ZYLOPRIM) 300 MG tablet Take 300 mg by mouth as needed.   [name] b complex vitamins tablet Take 1 tablet by mouth daily.  . Calcium 600-200 MG-UNIT tablet Take 1 tablet by mouth daily.  . Coenzyme Q10 100 MG capsule Take 100 mg by mouth daily.   [name] ezetimibe (ZETIA) 10 MG tablet TAKE ONE TABLET BY MOUTH EVERY MORNING  . fenofibrate 160 MG tablet TAKE ONE TABLET BY MOUTH EVERY MORNING  . fluticasone (FLONASE) 50 MCG/ACT nasal spray Place 2 sprays into both nostrils daily.  . furosemide (LASIX) 40 MG tablet Take 1 tablet (40 mg total) by mouth daily.  [name] levothyroxine (SYNTHROID) 88 MCG tablet TAKE ONE TABLET BY MOUTH BEFORE BREAKFAST  . loratadine (CLARITIN) 10 MG tablet Take 1 tablet (10 mg total) by mouth daily.  [name] MAXITROL 3.5-10000-0.1 OINT Apply 1 Application Both Eyes every evening (Patient not taking: No sig reported)  . NON FORMULARY Take 1 tablet by mouth daily. (Patient not taking: No sig reported)  . omeprazole (PRILOSEC) 20 MG capsule TAKE ONE CAPSULE BY MOUTH EVERY MORNING  . prednisoLONE acetate (PRED FORTE) 1 % ophthalmic suspension 1 drop every morning. (Patient not taking: No sig reported)  . triamcinolone (KENALOG) 0.1 % Apply topically 2 (two) times daily.  [name] triamcinolone  (NASACORT) 55 MCG/ACT AERO nasal inhaler Place 2 sprays into the nose daily. (Patient not taking: No sig reported)   No facility-administered encounter medications on file as of 06/07/2020.   Reviewed chart for medication changes ahead of medication coordination call.  BP Readings from Last 3 Encounters:  04/26/20 130/82  03/20/20 (!) 161/91  11/09/19 138/80    No results found for: HGBA1C   Patient obtains medications through Adherence Packaging  90 Days   Last adherence delivery included: Ezetimibe 20 mg Furosemide 40 mg Fenofibrate 160 mg Levothyroxine 85 mcg Omeprazole 20 mg   Patient is due for next adherence delivery on: 06/15/20. Called patient and reviewed medications and coordinated delivery.  This delivery to include: Ezetimibe 20 mg- take one tab every morning Furosemide 40 mg- take one tab every morning Fenofibrate 160 mg- take one tab every morning Levothyroxine 85 mcg- take one tab daily before breakfast  Omeprazole 20 mg- take one tab every morning Flonase 50 mcg/act- 2 sprays daily   Confirmed delivery date of 06/15/20, advised patient that pharmacy will contact them the morning of delivery.  Wilford Sports CPA, CMA

## 2020-07-17 ENCOUNTER — Telehealth: Payer: Self-pay | Admitting: Family Medicine

## 2020-07-18 ENCOUNTER — Encounter: Payer: Self-pay | Admitting: Family Medicine

## 2020-07-18 ENCOUNTER — Telehealth (INDEPENDENT_AMBULATORY_CARE_PROVIDER_SITE_OTHER): Payer: Medicare Other | Admitting: Family Medicine

## 2020-07-18 DIAGNOSIS — R634 Abnormal weight loss: Secondary | ICD-10-CM | POA: Diagnosis not present

## 2020-07-18 DIAGNOSIS — R195 Other fecal abnormalities: Secondary | ICD-10-CM | POA: Diagnosis not present

## 2020-07-18 DIAGNOSIS — Z800 Family history of malignant neoplasm of digestive organs: Secondary | ICD-10-CM | POA: Diagnosis not present

## 2020-07-18 NOTE — Progress Notes (Signed)
Virtual Visit via Video   I connected with patient on 07/18/20 at  9:30 AM EDT by a video enabled telemedicine application and verified that I am speaking with the correct person using two identifiers.  Location patient: Home Location provider: [facility], Office Persons participating in the virtual visit: Patient, Provider, [place] (Sabrina M)  I discussed the limitations of evaluation and management by telemedicine and the availability of in person appointments. The patient expressed understanding and agreed to proceed.  Subjective:   HPI:   GI concerns- pt reports that stools have changed.  Pt reports she initially had constipation.  Started taking Miralax.  Stool was then ribbon like.  Then it was finger like.  Now stool is not formed and she has to take Laxatives daily.  No pain, no blood.  + family hx of colon cancer in son.  No night sweats or chills.  No nausea.  No abd pain, no palpable masses.  Pt reports she has lost 10 lbs but admits she is eating less b/c of her constipation issues.  ROS:   See pertinent positives and negatives per HPI.  Patient Active Problem List   Diagnosis Date Noted   Chronic left shoulder pain 03/10/2019   Morbid obesity ([place]) 02/11/2018   DDD (degenerative disc disease), lumbar 10/07/2016   Primary osteoarthritis of both hands 10/07/2016   Primary osteoarthritis of both feet 10/07/2016   Tension-type headache, not intractable 09/16/2016   GERD (gastroesophageal reflux disease) 09/11/2016   ANA positive 09/02/2016   History of total knee replacement, bilateral 03/20/2015   Hypothyroidism 09/21/2014   Gout 06/20/2014   Lower leg edema 06/13/2014   Meningioma ([place]) 10/07/2013   Chronic tension-type headache, intractable 10/07/2013   Depression 06/23/2013   Insomnia 05/26/2013   Routine general medical examination at a health care facility 10/17/2011   HTN (hypertension) 07/03/2011   Hyperlipidemia 07/03/2011   Venous insufficiency,  peripheral 07/03/2011    Social History   Tobacco Use   Smoking status: Never   Smokeless tobacco: Never  Substance Use Topics   Alcohol use: No    Current Outpatient Medications:    acetaminophen (TYLENOL) 500 MG tablet, Take 2 tablets (1,000 mg total) by mouth every 6 (six) hours as needed., Disp: 30 tablet, Rfl: 0   allopurinol (ZYLOPRIM) 300 MG tablet, Take 300 mg by mouth as needed. , Disp: , Rfl: 0   b complex vitamins tablet, Take 1 tablet by mouth daily., Disp: , Rfl:    Calcium 600-200 MG-UNIT tablet, Take 1 tablet by mouth daily., Disp: , Rfl:    Coenzyme Q10 100 MG capsule, Take 100 mg by mouth daily. , Disp: , Rfl:    ezetimibe (ZETIA) 10 MG tablet, TAKE ONE TABLET BY MOUTH EVERY MORNING, Disp: 90 tablet, Rfl: 1   fenofibrate 160 MG tablet, TAKE ONE TABLET BY MOUTH EVERY MORNING, Disp: 90 tablet, Rfl: 1   fluticasone (FLONASE) 50 MCG/ACT nasal spray, Place 2 sprays into both nostrils daily., Disp: , Rfl:    furosemide (LASIX) 40 MG tablet, Take 1 tablet (40 mg total) by mouth daily., Disp: 90 tablet, Rfl: 3   levothyroxine (SYNTHROID) 88 MCG tablet, TAKE ONE TABLET BY MOUTH BEFORE BREAKFAST, Disp: 90 tablet, Rfl: 1   loratadine (CLARITIN) 10 MG tablet, Take 1 tablet (10 mg total) by mouth daily., Disp: , Rfl:    triamcinolone (KENALOG) 0.1 %, Apply topically 2 (two) times daily., Disp: , Rfl:    MAXITROL 3.5-10000-0.1 OINT, Apply 1  Application Both Eyes every evening (Patient not taking: No sig reported), Disp: , Rfl:    NON FORMULARY, Take 1 tablet by mouth daily. (Patient not taking: No sig reported), Disp: , Rfl:    omeprazole (PRILOSEC) 20 MG capsule, TAKE ONE CAPSULE BY MOUTH EVERY MORNING (Patient not taking: Reported on 07/18/2020), Disp: 90 capsule, Rfl: 1   prednisoLONE acetate (PRED FORTE) 1 % ophthalmic suspension, 1 drop every morning. (Patient not taking: No sig reported), Disp: , Rfl:    triamcinolone (NASACORT) 55 MCG/ACT AERO nasal inhaler, Place 2 sprays into  the nose daily. (Patient not taking: No sig reported), Disp: 1 Inhaler, Rfl: 12  Allergies  Allergen Reactions   Aspirin Other (See Comments)    Noted caused 2 holes in retina, not sure    Codeine Hives   Statins     Intolerance to statins   Tizanidine Hcl Other (See Comments)    Confusion    Objective:   There were no vitals taken for this visit. AAOx3, NAD NCAT, EOMI No obvious CN deficits Coloring WNL Pt is able to speak clearly, coherently without shortness of breath or increased work of breathing.  Thought process is linear.  Pt is tearful  Assessment and Plan:   Change in stool- new.  Pt has had constipation for awhile but reports recently stool has changed caliber and now isn't formed at all.  Son had colon cancer.  She is very concerned that she also has colon cancer and that she 'waited too long'.  Will refer to GI ASAP for complete evaluation.  Weight loss- new.  Pt reports 10 lb unexplained weight loss.  She does admit to eating less due to her constipation but she doesn't feel this would account for 10 lbs.  Again, will refer to GI ASAP for complete evaluation.  Pt expressed understanding and is in agreement w/ plan.    Annye Asa, MD 07/18/2020

## 2020-07-18 NOTE — Progress Notes (Signed)
I connected with  Kristen Manning on 07/18/20 by a video enabled telemedicine application and verified that I am speaking with the correct person using two identifiers.   I discussed the limitations of evaluation and management by telemedicine. The patient expressed understanding and agreed to proceed.

## 2020-07-19 ENCOUNTER — Telehealth: Payer: Medicare Other | Admitting: Family Medicine

## 2020-07-19 NOTE — Telephone Encounter (Signed)
Encounter started in error.

## 2020-08-10 ENCOUNTER — Encounter (HOSPITAL_BASED_OUTPATIENT_CLINIC_OR_DEPARTMENT_OTHER): Payer: Self-pay | Admitting: Emergency Medicine

## 2020-08-10 ENCOUNTER — Emergency Department (HOSPITAL_BASED_OUTPATIENT_CLINIC_OR_DEPARTMENT_OTHER)
Admission: EM | Admit: 2020-08-10 | Discharge: 2020-08-10 | Disposition: A | Discharge status: Home / Self Care | Payer: Medicare Other | Attending: Emergency Medicine | Admitting: Emergency Medicine

## 2020-08-10 ENCOUNTER — Other Ambulatory Visit: Payer: Self-pay

## 2020-08-10 ENCOUNTER — Emergency Department (HOSPITAL_BASED_OUTPATIENT_CLINIC_OR_DEPARTMENT_OTHER): Payer: Medicare Other

## 2020-08-10 DIAGNOSIS — Z800 Family history of malignant neoplasm of digestive organs: Secondary | ICD-10-CM | POA: Insufficient documentation

## 2020-08-10 DIAGNOSIS — I252 Old myocardial infarction: Secondary | ICD-10-CM | POA: Diagnosis not present

## 2020-08-10 DIAGNOSIS — R634 Abnormal weight loss: Secondary | ICD-10-CM | POA: Insufficient documentation

## 2020-08-10 DIAGNOSIS — E039 Hypothyroidism, unspecified: Secondary | ICD-10-CM | POA: Insufficient documentation

## 2020-08-10 DIAGNOSIS — R609 Edema, unspecified: Secondary | ICD-10-CM | POA: Diagnosis not present

## 2020-08-10 DIAGNOSIS — I10 Essential (primary) hypertension: Secondary | ICD-10-CM | POA: Insufficient documentation

## 2020-08-10 DIAGNOSIS — R1084 Generalized abdominal pain: Secondary | ICD-10-CM | POA: Insufficient documentation

## 2020-08-10 DIAGNOSIS — K5900 Constipation, unspecified: Secondary | ICD-10-CM | POA: Diagnosis present

## 2020-08-10 DIAGNOSIS — Z79899 Other long term (current) drug therapy: Secondary | ICD-10-CM | POA: Diagnosis not present

## 2020-08-10 LAB — COMPREHENSIVE METABOLIC PANEL
ALT: 15 U/L (ref 0–44)
AST: 19 U/L (ref 15–41)
Albumin: 3.7 g/dL (ref 3.5–5.0)
Alkaline Phosphatase: 55 U/L (ref 38–126)
Anion gap: 8 (ref 5–15)
BUN: 15 mg/dL (ref 8–23)
CO2: 27 mmol/L (ref 22–32)
Calcium: 9.2 mg/dL (ref 8.9–10.3)
Chloride: 102 mmol/L (ref 98–111)
Creatinine, Ser: 1.06 mg/dL — ABNORMAL HIGH (ref 0.44–1.00)
GFR, Estimated: 51 mL/min — ABNORMAL LOW (ref >60)
Glucose, Bld: 95 mg/dL (ref 70–99)
Potassium: 4.3 mmol/L (ref 3.5–5.1)
Sodium: 137 mmol/L (ref 135–145)
Total Bilirubin: 0.5 mg/dL (ref 0.3–1.2)
Total Protein: 6.9 g/dL (ref 6.5–8.1)

## 2020-08-10 LAB — CBC WITH DIFFERENTIAL/PLATELET
Abs Immature Granulocytes: 0.05 10*3/uL (ref 0.00–0.07)
Basophils Absolute: 0 10*3/uL (ref 0.0–0.1)
Basophils Relative: 0 %
Eosinophils Absolute: 0.1 10*3/uL (ref 0.0–0.5)
Eosinophils Relative: 1 %
HCT: 37.3 % (ref 36.0–46.0)
Hemoglobin: 12.7 g/dL (ref 12.0–15.0)
Immature Granulocytes: 1 %
Lymphocytes Relative: 16 %
Lymphs Abs: 1.4 10*3/uL (ref 0.7–4.0)
MCH: 31.1 pg (ref 26.0–34.0)
MCHC: 34 g/dL (ref 30.0–36.0)
MCV: 91.2 fL (ref 80.0–100.0)
Monocytes Absolute: 0.7 10*3/uL (ref 0.1–1.0)
Monocytes Relative: 8 %
Neutro Abs: 6.3 10*3/uL (ref 1.7–7.7)
Neutrophils Relative %: 74 %
Platelets: 338 10*3/uL (ref 150–400)
RBC: 4.09 MIL/uL (ref 3.87–5.11)
RDW: 13.2 % (ref 11.5–15.5)
WBC: 8.5 10*3/uL (ref 4.0–10.5)
nRBC: 0 % (ref 0.0–0.2)

## 2020-08-10 LAB — URINALYSIS, ROUTINE W REFLEX MICROSCOPIC
Bilirubin Urine: NEGATIVE
Glucose, UA: NEGATIVE mg/dL
Hgb urine dipstick: NEGATIVE
Ketones, ur: NEGATIVE mg/dL
Leukocytes,Ua: NEGATIVE
Nitrite: NEGATIVE
Protein, ur: NEGATIVE mg/dL
Specific Gravity, Urine: <1.005 — ABNORMAL LOW (ref 1.005–1.030)
pH: 7.5 (ref 5.0–8.0)

## 2020-08-10 LAB — LIPASE, BLOOD: Lipase: 34 U/L (ref 11–51)

## 2020-08-10 MED ORDER — SODIUM CHLORIDE 0.9 % IV SOLN: Freq: Once | INTRAVENOUS | Status: AC

## 2020-08-10 MED ORDER — IOHEXOL 300 MG/ML  SOLN
100.0000 mL | Freq: Once | INTRAMUSCULAR | Status: AC | PRN
  Administered 2020-08-10: 100 mL via INTRAVENOUS

## 2020-08-10 NOTE — ED Notes (Signed)
Pt back from ct, pt denies pain, resps even and unlabored

## 2020-08-10 NOTE — Discharge Instructions (Addendum)
Your CT scan did not show any signs of cancer or suspicious appearing masses.  Follow-up with the gastroenterologist as recommended.

## 2020-08-10 NOTE — ED Provider Notes (Signed)
[facility] EMERGENCY DEPARTMENT Provider Note   CSN: [id number] Arrival date & time: 08/10/20  [id number]     History Chief Complaint  Patient presents with   Constipation    Kristen Manning is a 87 y.o. female.  HPI Has had change in bowel pattern over the past 3 months.  She was having constipation and then had change in stool caliber.  She reports she has been taking MiraLAX regularly and having frequent soft bowel movement.  She reports she has some general abdominal discomfort.  She has had 10 pounds of unintentional weight loss.  Patient has had some nausea with eating but only vomited once since onset of symptoms.  She denies pain burning urgency with urination.  No fevers no chills.  No shortness of breath no chest pain.  Patient has family history of colon cancer in her son and her brother was recently treated for urinary bladder cancer.  Patient is concerned with weight loss and stool pattern and abdominal discomfort for cancer.  She saw her PCP in March for constipation and bowel change, followed bowel regimen but still having abnormal bowel pattern and weight loss.  Patient is referred to GI with first appointment scheduled for second week of August.  Patient reports symptoms are worsening and cannot wait until her appointment to be seen.    Past Medical History:  Diagnosis Date   Arthritis    Bacterial infection    in lungs in Jan 2015   Bilateral lower extremity edema    Cancer ([place])    skin cancer, back, legs melonama, sees Dr Lisabeth Pick, derm   Chronic back pain    GERD (gastroesophageal reflux disease)    takes Omeprazole daily   Headache(784.0)    History of migraine    last one about 15 yrs ago   Hyperlipidemia    takes Zetia daily   Hypertension    takes HCTZ daily   Joint pain    Joint swelling    Myocardial infarction [facility])    pt states EKG always shows infarct but no change from previous ekg;never knew anything about it   Nocturia     Osteoarthritis    Pneumonia    hx of-many yrs ago   Pneumonia    PONV (postoperative nausea and vomiting)    Shortness of breath    with exertion   Urinary frequency    Urinary incontinence    Urinary urgency     Patient Active Problem List   Diagnosis Date Noted   Chronic left shoulder pain 03/10/2019   Morbid obesity ([place]) 02/11/2018   DDD (degenerative disc disease), lumbar 10/07/2016   Primary osteoarthritis of both hands 10/07/2016   Primary osteoarthritis of both feet 10/07/2016   Tension-type headache, not intractable 09/16/2016   GERD (gastroesophageal reflux disease) 09/11/2016   ANA positive 09/02/2016   History of total knee replacement, bilateral 03/20/2015   Hypothyroidism 09/21/2014   Gout 06/20/2014   Lower leg edema 06/13/2014   Meningioma ([place]) 10/07/2013   Chronic tension-type headache, intractable 10/07/2013   Depression 06/23/2013   Insomnia 05/26/2013   Routine general medical examination at a health care facility 10/17/2011   HTN (hypertension) 07/03/2011   Hyperlipidemia 07/03/2011   Venous insufficiency, peripheral 07/03/2011    Past Surgical History:  Procedure Laterality Date   bladder tack  1998   BLEPHAROPLASTY Bilateral    JOINT REPLACEMENT Left 10/02/2009   JOINT REPLACEMENT Right 02-22-13   Knee   KNEE SURGERY Bilateral  2009 and 2011   [place] [facility]   only one   TONSILLECTOMY  as a child ? date   TOTAL KNEE ARTHROPLASTY Right 02/22/2013   DR Ronnie Derby   TOTAL KNEE ARTHROPLASTY Right 02/22/2013   Procedure: TOTAL KNEE ARTHROPLASTY;  Surgeon: Vickey Huger, MD;  Location: [facility];  Service: Orthopedics;  Laterality: Right;     OB History   No obstetric history on file.     Family History  Problem Relation Age of Onset   Kidney disease Mother    Heart disease Mother    Hypertension Mother    Varicose Veins Mother    Kidney failure Mother    Alcohol abuse Father    Stroke Father        several    Hypertension Father    Varicose  Veins Father    Alcohol abuse Brother    Hyperlipidemia Brother    Hypertension Brother    Early death Brother    Varicose Veins Brother    Heart disease Brother    Hypertension Brother    Arthritis Brother        Gout   Varicose Veins Son    Deep vein thrombosis Son    Breast cancer Maternal Aunt     Social History   Tobacco Use   Smoking status: Never   Smokeless tobacco: Never  Vaping Use   Vaping Use: Never used  Substance Use Topics   Alcohol use: No   Drug use: No    Home Medications Prior to Admission medications   Medication Sig Start Date End Date Taking? Authorizing Provider  acetaminophen (TYLENOL) 500 MG tablet Take 2 tablets (1,000 mg total) by mouth every 6 (six) hours as needed. 03/20/20   Tedd Sias, PA  allopurinol (ZYLOPRIM) 300 MG tablet Take 300 mg by mouth as needed.  06/27/15   [provider]  b complex vitamins tablet Take 1 tablet by mouth daily.    [provider]  Calcium 600-200 MG-UNIT tablet Take 1 tablet by mouth daily.    [provider]  Coenzyme Q10 100 MG capsule Take 100 mg by mouth daily.     [provider]  ezetimibe (ZETIA) 10 MG tablet TAKE ONE TABLET BY MOUTH EVERY MORNING 03/06/20   Midge Minium, MD  fenofibrate 160 MG tablet TAKE ONE TABLET BY MOUTH EVERY MORNING 03/06/20   Midge Minium, MD  fluticasone (FLONASE) 50 MCG/ACT nasal spray Place 2 sprays into both nostrils daily.    [provider]  furosemide (LASIX) 40 MG tablet Take 1 tablet (40 mg total) by mouth daily. 09/17/19   Lorretta Harp, MD  levothyroxine (SYNTHROID) 88 MCG tablet TAKE ONE TABLET BY MOUTH BEFORE BREAKFAST 06/02/20   Midge Minium, MD  loratadine (CLARITIN) 10 MG tablet Take 1 tablet (10 mg total) by mouth daily. 01/16/17   Midge Minium, MD  MAXITROL 3.5-10000-0.1 OINT Apply 1 Application Both Eyes every evening Patient not taking: No sig reported 12/25/18   [provider]   NON FORMULARY Take 1 tablet by mouth daily. Patient not taking: No sig reported    [provider]  omeprazole (PRILOSEC) 20 MG capsule TAKE ONE CAPSULE BY MOUTH EVERY MORNING Patient not taking: Reported on 07/18/2020 03/06/20   Midge Minium, MD  prednisoLONE acetate (PRED FORTE) 1 % ophthalmic suspension 1 drop every morning. Patient not taking: No sig reported 12/28/18   [provider]  triamcinolone (KENALOG)  0.1 % Apply topically 2 (two) times daily. 02/10/20   [provider]  triamcinolone (NASACORT) 55 MCG/ACT AERO nasal inhaler Place 2 sprays into the nose daily. Patient not taking: No sig reported 11/17/17   Jacelyn Pi, Lilia Argue, MD    Allergies    Aspirin, Codeine, Statins, and Tizanidine hcl  Review of Systems   Review of Systems 10 Systems reviewed and negative except as per HPI Physical Exam Updated Vital Signs BP (!) 187/71   Pulse 90   Temp 98.8 F (37.1 C) (Oral)   Resp (!) 22   Ht '5\' 5"'$  (1.651 m)   Wt 94.3 kg   SpO2 100%   BMI 34.61 kg/m   Physical Exam Constitutional:      Comments: Alert nontoxic.  Mental status clear.  No respiratory distress.  HENT:     Mouth/Throat:     Pharynx: Oropharynx is clear.  Eyes:     Extraocular Movements: Extraocular movements intact.  Cardiovascular:     Rate and Rhythm: Normal rate and regular rhythm.  Pulmonary:     Effort: Pulmonary effort is normal.     Breath sounds: Normal breath sounds.  Abdominal:     Comments: Soft.  No guarding.  Mild discomfort diffusely lower abdomen.  No localizing.  Musculoskeletal:     Comments: 2+ symmetric edema of the lower legs.  Feet are warm and dry.  Distal pulses 2+ and symmetric.  Patient reports edema and skin scaling is chronic.  No change.  Skin:    General: Skin is warm and dry.  Neurological:     General: No focal deficit present.     Mental Status: She is oriented to person, place, and time.     Coordination: Coordination normal.   Psychiatric:        Mood and Affect: Mood normal.    ED Results / Procedures / Treatments   Labs (all labs ordered are listed, but only abnormal results are displayed) Labs Reviewed - No data to display  EKG None  Radiology No results found.  Procedures Procedures   Medications Ordered in ED Medications - No data to display  ED Course  I have reviewed the triage vital signs and the nursing notes.  Pertinent labs & imaging results that were available during my care of the patient were reviewed by me and considered in my medical decision making (see chart for details).    MDM Rules/Calculators/A&P                           Patient presents with approximately 3 months duration of change in bowel habits with unintentional weight loss and concern for strong family history of cancer.  Patient has future appointment with gastroenterology.  She presents with worsening symptoms.  We will proceed with diagnostic lab work and CT abdomen.  Diagnostic work-up without acute findings.  Clinically patient is stable.  Appropriate for continued follow-up with gastroenterology.  Final Clinical Impression(s) / ED Diagnoses Final diagnoses:  Generalized abdominal pain  Weight loss, non-intentional    Rx / DC Orders ED Discharge Orders     None        Charlesetta Shanks, MD 08/25/20 [phone]

## 2020-08-10 NOTE — ED Triage Notes (Signed)
Pt reports constipation. Pt has been taking laxative without relief.

## 2020-08-16 ENCOUNTER — Telehealth: Payer: Self-pay | Admitting: Family Medicine

## 2020-08-16 NOTE — Telephone Encounter (Signed)
Left message for patient to call back and schedule Medicare Annual Wellness Visit (AWV).   Please offer to do virtually or by telephone.  Left office number and my jabber [phone].  Last AWV:09/11/2016  Please schedule at anytime with Nurse Health Advisor.

## 2020-08-17 ENCOUNTER — Ambulatory Visit (INDEPENDENT_AMBULATORY_CARE_PROVIDER_SITE_OTHER): Payer: Medicare Other | Admitting: Gastroenterology

## 2020-08-17 ENCOUNTER — Encounter: Payer: Self-pay | Admitting: Gastroenterology

## 2020-08-17 VITALS — BP 180/100 | HR 80 | Ht 65.0 in | Wt 203.2 lb

## 2020-08-17 DIAGNOSIS — R634 Abnormal weight loss: Secondary | ICD-10-CM | POA: Diagnosis not present

## 2020-08-17 DIAGNOSIS — R194 Change in bowel habit: Secondary | ICD-10-CM | POA: Diagnosis not present

## 2020-08-17 DIAGNOSIS — R140 Abdominal distension (gaseous): Secondary | ICD-10-CM | POA: Diagnosis not present

## 2020-08-17 DIAGNOSIS — K8020 Calculus of gallbladder without cholecystitis without obstruction: Secondary | ICD-10-CM | POA: Diagnosis not present

## 2020-08-17 NOTE — Patient Instructions (Signed)
If you are age 85 or older, your body mass index should be between 23-30. Your Body mass index is 33.81 kg/m. If this is out of the aforementioned range listed, please consider follow up with your Primary Care Provider.  If you are age 60 or younger, your body mass index should be between 19-25. Your Body mass index is 33.81 kg/m. If this is out of the aformentioned range listed, please consider follow up with your Primary Care Provider.   __________________________________________________________  The [facility] providers would like to encourage you to use [facility] to communicate with providers for non-urgent requests or questions.  Due to long hold times on the telephone, sending your provider a message by [facility] may be a faster and more efficient way to get a response.  Please allow 48 business hours for a response.  Please remember that this is for non-urgent requests.   It was a pleasure to see you today!  Thank you for trusting me with your gastrointestinal care!

## 2020-08-17 NOTE — Progress Notes (Signed)
[facility] Progress Note  Chief Complaint: Change in bowel habit  Subjective  History: Kristen Manning is a very pleasant 87 year old woman I saw once in October 2019 for chest wall pain after referral from a local urgent care. She saw primary care several months ago for change in bowel habits and weight loss.  Stools have been thinner one smaller caliber and sometimes semiformed.  She had purposely lost some weight, but then lost few more pounds than she expected to.  That has lately stabilized, and she thinks it may have been due to her change in bowel habits.  She became increasingly concerned because her son had colon cancer in his early 18s and she really has never had a colonoscopy or other colon cancer screening.  Aj was using 1-2 capfuls of MiraLAX every few days for the symptoms, then saw PCP several months ago and they recommended taking a capful every night. She went to the ED recently (see below) because she was having more bloating that day.  After that they recommended she stop the MiraLAX altogether, and in the last few days she has had 2 BMs. Wajiha denies rectal bleeding nausea or vomiting. Lastly, Enid Derry saw Dr. Mar Daring man with in the last 4 to 6 weeks on the recommendation of a friend.  She says Dr. Collene Mares told her to take more laxatives and did not schedule any further testing.  Overall, she did not feel it was a good experience.  ROS: Cardiovascular:  no chest pain Respiratory: no dyspnea Marked peripheral edema Bilateral knee pain, affecting her ambulation and balance Remainder of systems negative except as above The patient's Past Medical, Family and Social History were reviewed and are on file in the EMR. Past Medical History:  Diagnosis Date   Arthritis    Bacterial infection    in lungs in Jan 2015   Bilateral lower extremity edema    Cancer ([place])    skin cancer, back, legs melonama, sees Dr Lisabeth Pick, derm   Chronic back pain    GERD  (gastroesophageal reflux disease)    takes Omeprazole daily   Headache(784.0)    History of migraine    last one about 15 yrs ago   Hyperlipidemia    takes Zetia daily   Hypertension    takes HCTZ daily   Joint pain    Joint swelling    Myocardial infarction [facility])    pt states EKG always shows infarct but no change from previous ekg;never knew anything about it   Nocturia    Osteoarthritis    Pneumonia    hx of-many yrs ago   Pneumonia    PONV (postoperative nausea and vomiting)    Shortness of breath    with exertion   Urinary frequency    Urinary incontinence    Urinary urgency    Cardiology office note by Dr. Alvester Chou on 08/27/2019 was reviewed.  Describes patient's dyspnea on exertion, occasional chest pain and peripheral edema.  Venous reflux study positive   Past Surgical History:  Procedure Laterality Date   bladder tack  01/08/1996   BLEPHAROPLASTY Bilateral    JOINT REPLACEMENT Right 02/22/2013   Knee   OOPHORECTOMY  01/08/1996   only one   REPLACEMENT TOTAL KNEE Left 10/02/2009   TONSILLECTOMY  as a child ? date   TOTAL KNEE ARTHROPLASTY Right 02/22/2013   Procedure: TOTAL KNEE ARTHROPLASTY;  Surgeon: Vickey Huger, MD;  Location: [facility];  Service: Orthopedics;  Laterality: Right;  Social History   Socioeconomic History   Marital status: Widowed    Spouse name: Not on file   Number of children: 4   Years of education: Not on file   Highest education level: Not on file  Occupational History   Not on file  Tobacco Use   Smoking status: Never   Smokeless tobacco: Never  Vaping Use   Vaping Use: Never used  Substance and Sexual Activity   Alcohol use: No   Drug use: No   Sexual activity: Not on file  Other Topics Concern   Not on file  Social History Narrative   Lives with brother    Right handed    Social Determinants of Health   Financial Resource Strain: Not on file  Food Insecurity: Not on file  Transportation Needs: Not on file  Physical  Activity: Not on file  Stress: Not on file  Social Connections: Not on file  Intimate Partner Violence: Not on file   Family History  Problem Relation Age of Onset   Kidney disease Mother    Heart disease Mother    Hypertension Mother    Varicose Veins Mother    Kidney failure Mother    Alcohol abuse Father    Stroke Father        several    Hypertension Father    Varicose Veins Father    Alcohol abuse Brother    Hyperlipidemia Brother    Hypertension Brother    Early death Brother    Varicose Veins Brother    Bladder Cancer Brother    Heart disease Brother    Hypertension Brother    Arthritis Brother        Gout   Varicose Veins Son    Deep vein thrombosis Son    Colon cancer Son    Breast cancer Maternal Aunt    Pancreatic cancer Neg Hx    Stomach cancer Neg Hx    Esophageal cancer Neg Hx    Liver disease Neg Hx     Objective:  Med list reviewed  Current Outpatient Medications:    acetaminophen (TYLENOL) 500 MG tablet, Take 2 tablets (1,000 mg total) by mouth every 6 (six) hours as needed., Disp: 30 tablet, Rfl: 0   allopurinol (ZYLOPRIM) 300 MG tablet, Take 300 mg by mouth as needed. , Disp: , Rfl: 0   b complex vitamins tablet, Take 1 tablet by mouth daily., Disp: , Rfl:    Calcium 600-200 MG-UNIT tablet, Take 1 tablet by mouth daily., Disp: , Rfl:    Coenzyme Q10 100 MG capsule, Take 100 mg by mouth daily. , Disp: , Rfl:    ezetimibe (ZETIA) 10 MG tablet, TAKE ONE TABLET BY MOUTH EVERY MORNING, Disp: 90 tablet, Rfl: 1   fenofibrate 160 MG tablet, TAKE ONE TABLET BY MOUTH EVERY MORNING, Disp: 90 tablet, Rfl: 1   furosemide (LASIX) 40 MG tablet, Take 1 tablet (40 mg total) by mouth daily., Disp: 90 tablet, Rfl: 3   levothyroxine (SYNTHROID) 88 MCG tablet, TAKE ONE TABLET BY MOUTH BEFORE BREAKFAST, Disp: 90 tablet, Rfl: 1   loratadine (CLARITIN) 10 MG tablet, Take 1 tablet (10 mg total) by mouth daily., Disp: , Rfl:    Polyethylene Glycol 3350 (MIRALAX PO), Take  17 g by mouth daily as needed., Disp: , Rfl:    triamcinolone (KENALOG) 0.1 %, Apply topically 2 (two) times daily., Disp: , Rfl:    triamcinolone (NASACORT) 55 MCG/ACT AERO nasal inhaler, Place 2  sprays into the nose daily. (Patient taking differently: Place 2 sprays into the nose daily as needed.), Disp: 1 Inhaler, Rfl: 12   Vital signs in last 24 hrs: Vitals:   08/17/20 0910  BP: (!) 180/100  Pulse: 80   Wt Readings from Last 3 Encounters:  08/17/20 203 lb 3.2 oz (92.2 kg)  08/10/20 208 lb (94.3 kg)  04/26/20 210 lb 12.8 oz (95.6 kg)    Patient down 7 pounds in 4 months   Physical Exam  Antalgic gait, needs assistance getting on exam table.  She was dyspneic with just that exertion HEENT: sclera anicteric, oral mucosa moist without lesions Neck: supple, no thyromegaly, JVD or lymphadenopathy Cardiac: RRR without murmurs, S1S2 heard, marked peripheral edema with brawny erythema and skin scaling Pulm: clear to auscultation bilaterally, normal RR and effort noted Abdomen: soft, no tenderness, with active bowel sounds. No guarding or palpable hepatosplenomegaly. Skin; warm and dry, no jaundice or rash  Labs:  Labs from ED visit 08/10/2020 (there is no provider note, patient may have left before being seen)  Normal CBC, CMP, lipase, urinalysis ___________________________________________ Radiologic studies:  CLINICAL DATA:  Unintentional weight loss, abdominal distention.   EXAM: CT ABDOMEN AND PELVIS WITH CONTRAST   TECHNIQUE: Multidetector CT imaging of the abdomen and pelvis was performed using the standard protocol following bolus administration of intravenous contrast.   CONTRAST:  11m OMNIPAQUE IOHEXOL 300 MG/ML  SOLN   COMPARISON:  03/10/2018   FINDINGS: Lower chest: No acute abnormality.  Scarring in the lung bases.   Hepatobiliary: Numerous gallstones within the gallbladder. No biliary ductal dilatation. No focal hepatic abnormality.   Pancreas: No focal  abnormality or ductal dilatation.   Spleen: No focal abnormality.  Normal size.   Adrenals/Urinary Tract: Small cyst in the midpole of the left kidney. No hydronephrosis. No stones. Adrenal glands and urinary bladder unremarkable.   Stomach/Bowel: Sigmoid diverticulosis. No active diverticulitis. Stomach and small bowel decompressed, unremarkable.   Vascular/Lymphatic: Aortic atherosclerosis. No evidence of aneurysm or adenopathy.   Reproductive: Prior hysterectomy.  No adnexal masses.   Other: No free fluid or free air.   Musculoskeletal: No acute bony abnormality.   IMPRESSION: Sigmoid diverticulosis.  No active diverticulitis.   Cholelithiasis.   Aortic atherosclerosis.   No acute findings in the abdomen or pelvis.     Electronically Signed   By: KRolm BaptiseM.D.   On: 08/10/2020 08:41    ____________________________________________ Other: Last echocardiogram April 2017 with normal LVEF, grade 2 diastolic dysfunction  _____________________________________________ Assessment & Plan  Assessment: Encounter Diagnoses  Name Primary?   Change in bowel habits Yes   Abdominal bloating    Weight loss    Calculus of gallbladder without cholecystitis without obstruction    Several months of change in bowel habits, appears likely benign and perhaps related to diverticulosis. SCodeewas concerned about malignancy, especially in light of her son's history.  However, there is no evidence of mass or obstruction on the CT scan. Incidental cholelithiasis, I reassured her no indication for cholecystectomy at this point  Given her age and overall condition, I recommended against the colonoscopy.  I think the risk would outweigh the expected benefits.  Plan: Use the MiraLAX 1 capful every other day, adjusting dose as needed. She was reassured, and plans to see me as needed.  40 minutes were spent on this encounter (including chart review, history/exam,  counseling/coordination of care, and documentation) > 50% of that time was spent on counseling and  coordination of care.  Extensive chart review and face-to-face evaluation required.  Topics discussed included: See above.  Nelida Meuse III

## 2020-08-21 ENCOUNTER — Ambulatory Visit: Payer: Medicare Other

## 2020-08-21 ENCOUNTER — Telehealth: Payer: Self-pay

## 2020-08-21 ENCOUNTER — Telehealth: Payer: Self-pay | Admitting: *Deleted

## 2020-08-21 NOTE — Telephone Encounter (Signed)
Unable to reach patient for AWV. Will need to reschedule

## 2020-08-22 NOTE — Telephone Encounter (Signed)
Error

## 2020-08-23 ENCOUNTER — Other Ambulatory Visit: Payer: Self-pay

## 2020-08-23 ENCOUNTER — Ambulatory Visit (INDEPENDENT_AMBULATORY_CARE_PROVIDER_SITE_OTHER): Payer: Medicare Other | Admitting: Family Medicine

## 2020-08-23 VITALS — BP 134/66 | HR 86 | Temp 98.2°F | Resp 16 | Ht 65.0 in | Wt 205.4 lb

## 2020-08-23 DIAGNOSIS — R195 Other fecal abnormalities: Secondary | ICD-10-CM | POA: Diagnosis not present

## 2020-08-23 DIAGNOSIS — M26629 Arthralgia of temporomandibular joint, unspecified side: Secondary | ICD-10-CM

## 2020-08-23 DIAGNOSIS — Z8719 Personal history of other diseases of the digestive system: Secondary | ICD-10-CM | POA: Diagnosis not present

## 2020-08-23 DIAGNOSIS — K5790 Diverticulosis of intestine, part unspecified, without perforation or abscess without bleeding: Secondary | ICD-10-CM

## 2020-08-23 DIAGNOSIS — H9202 Otalgia, left ear: Secondary | ICD-10-CM

## 2020-08-23 DIAGNOSIS — R140 Abdominal distension (gaseous): Secondary | ICD-10-CM

## 2020-08-23 NOTE — Progress Notes (Signed)
Subjective:  Patient ID: Kristen Manning, female    DOB: Jun 26, 1932  Age: 87 y.o. MRN: [id number]  CC:  Chief Complaint  Patient presents with   Diverticulitis    Pt here to follow up on diverticulitis, ER last week with CT saw GI 8/11 was dx Diverticulitis pt reports she was not informed how to manage this with er diet.     HPI Kristen Manning presents for   Diverticulitis primary care provider Dr. Birdie Riddle. Evaluated through [facility] on August 4 with symptoms of unintentional weight loss and abdominal distention.  Normal CBC, lipase, CMP except borderline creatinine at 1.06 with EGFR 51, urinalysis with spec gravity less than 1.005 but otherwise negative/normal.  CT indicated sigmoid diverticulosis without active diverticulitis, cholelithiasis, aortic atherosclerosis but no acute findings in the abdomen or pelvis, plan for gastroenterology follow-up.  Had seen Dr. Collene Mares in past - 2 caps of miralax and colace BID in past - too much.  Note reviewed from [facility] gastroenterology August 11, Dr. Loletha Carrow.  Several months of change in bowel habits, likely benign and perhaps related to diverticulosis.  Did discuss that there were no evidence of mass or obstruction on the CT scan and incidental cholelithiasis but no indication for cholecystectomy.  Recommended against colonoscopy given age and overall condition as the risks outweigh the expected benefits.  Recommended MiraLAX 1 cap every other day.  No prior diverticulitis.  Still having some bloating, but overall better. Concerned if she does not go everyday. Concerned about what to eat. Last BM 2 days ago - normal. Had 3 days of normal stools with more vegetables and more water.  Has not taken any miralax since 8/11 - trying to get system back in order.  No fever/nausea/vomiting.  Some lower left pain at times. Not persistent.  No recent diarrhea.  Wt Readings from Last 3 Encounters:  08/23/20 205 lb 6.4 oz (93.2 kg)  08/17/20  203 lb 3.2 oz (92.2 kg)  08/10/20 208 lb (94.3 kg)     History Patient Active Problem List   Diagnosis Date Noted   Chronic left shoulder pain 03/10/2019   Morbid obesity ([place]) 02/11/2018   DDD (degenerative disc disease), lumbar 10/07/2016   Primary osteoarthritis of both hands 10/07/2016   Primary osteoarthritis of both feet 10/07/2016   Tension-type headache, not intractable 09/16/2016   GERD (gastroesophageal reflux disease) 09/11/2016   ANA positive 09/02/2016   History of total knee replacement, bilateral 03/20/2015   Hypothyroidism 09/21/2014   Gout 06/20/2014   Lower leg edema 06/13/2014   Meningioma ([place]) 10/07/2013   Chronic tension-type headache, intractable 10/07/2013   Depression 06/23/2013   Insomnia 05/26/2013   Routine general medical examination at a health care facility 10/17/2011   HTN (hypertension) 07/03/2011   Hyperlipidemia 07/03/2011   Venous insufficiency, peripheral 07/03/2011   Past Medical History:  Diagnosis Date   Arthritis    Bacterial infection    in lungs in Jan 2015   Bilateral lower extremity edema    Cancer ([place])    skin cancer, back, legs melonama, sees Dr Lisabeth Pick, derm   Chronic back pain    GERD (gastroesophageal reflux disease)    takes Omeprazole daily   Headache(784.0)    History of migraine    last one about 15 yrs ago   Hyperlipidemia    takes Zetia daily   Hypertension    takes HCTZ daily   Joint pain    Joint swelling  Myocardial infarction [facility])    pt states EKG always shows infarct but no change from previous ekg;never knew anything about it   Nocturia    Osteoarthritis    Pneumonia    hx of-many yrs ago   Pneumonia    PONV (postoperative nausea and vomiting)    Shortness of breath    with exertion   Urinary frequency    Urinary incontinence    Urinary urgency    Past Surgical History:  Procedure Laterality Date   bladder tack  01/08/1996   BLEPHAROPLASTY Bilateral    JOINT REPLACEMENT Right  02/22/2013   Knee   OOPHORECTOMY  01/08/1996   only one   REPLACEMENT TOTAL KNEE Left 10/02/2009   TONSILLECTOMY  as a child ? date   TOTAL KNEE ARTHROPLASTY Right 02/22/2013   Procedure: TOTAL KNEE ARTHROPLASTY;  Surgeon: Vickey Huger, MD;  Location: [facility];  Service: Orthopedics;  Laterality: Right;   Allergies  Allergen Reactions   Aspirin Other (See Comments)    Noted caused 2 holes in retina, not sure    Codeine Hives   Statins     Intolerance to statins   Tizanidine Hcl Other (See Comments)    Confusion   Prior to Admission medications   Medication Sig Start Date End Date Taking? Authorizing Provider  acetaminophen (TYLENOL) 500 MG tablet Take 2 tablets (1,000 mg total) by mouth every 6 (six) hours as needed. 03/20/20  Yes Fondaw, Wylder S, PA  allopurinol (ZYLOPRIM) 300 MG tablet Take 300 mg by mouth as needed.  06/27/15  Yes [provider]  b complex vitamins tablet Take 1 tablet by mouth daily.   Yes [provider]  Calcium 600-200 MG-UNIT tablet Take 1 tablet by mouth daily.   Yes [provider]  Coenzyme Q10 100 MG capsule Take 100 mg by mouth daily.    Yes [provider]  ezetimibe (ZETIA) 10 MG tablet TAKE ONE TABLET BY MOUTH EVERY MORNING 03/06/20  Yes Midge Minium, MD  fenofibrate 160 MG tablet TAKE ONE TABLET BY MOUTH EVERY MORNING 03/06/20  Yes Midge Minium, MD  furosemide (LASIX) 40 MG tablet Take 1 tablet (40 mg total) by mouth daily. 09/17/19  Yes Lorretta Harp, MD  levothyroxine (SYNTHROID) 88 MCG tablet TAKE ONE TABLET BY MOUTH BEFORE BREAKFAST 06/02/20  Yes Midge Minium, MD  loratadine (CLARITIN) 10 MG tablet Take 1 tablet (10 mg total) by mouth daily. 01/16/17  Yes Midge Minium, MD  Polyethylene Glycol 3350 (MIRALAX PO) Take 17 g by mouth daily as needed.   Yes [provider]  triamcinolone (KENALOG) 0.1 % Apply topically 2 (two) times daily. 02/10/20  Yes [provider]   triamcinolone (NASACORT) 55 MCG/ACT AERO nasal inhaler Place 2 sprays into the nose daily. Patient taking differently: Place 2 sprays into the nose daily as needed. 11/17/17  Yes Jacelyn Pi, Lilia Argue, MD   Social History   Socioeconomic History   Marital status: Widowed    Spouse name: Not on file   Number of children: 4   Years of education: Not on file   Highest education level: Not on file  Occupational History   Not on file  Tobacco Use   Smoking status: Never   Smokeless tobacco: Never  Vaping Use   Vaping Use: Never used  Substance and Sexual Activity   Alcohol use: No   Drug use: No   Sexual activity: Not on file  Other Topics  Concern   Not on file  Social History Narrative   Lives with brother    Right handed    Social Determinants of Health   Financial Resource Strain: Not on file  Food Insecurity: Not on file  Transportation Needs: Not on file  Physical Activity: Not on file  Stress: Not on file  Social Connections: Not on file  Intimate Partner Violence: Not on file    Review of Systems Per HPI  Objective:   Vitals:   08/23/20 1552  BP: 134/66  Pulse: 86  Resp: 16  Temp: 98.2 F (36.8 C)  TempSrc: Temporal  SpO2: 97%  Weight: 205 lb 6.4 oz (93.2 kg)  Height: 5' 5"  (1.651 m)     Physical Exam Vitals reviewed.  Constitutional:      Appearance: Normal appearance. She is well-developed.  HENT:     Head: Normocephalic and atraumatic.     Right Ear: Tympanic membrane, ear canal and external ear normal.     Left Ear: Tympanic membrane, ear canal and external ear normal.     Ears:     Comments: No current pain but locates area of discomfort at the left TMJ, with some radiation inferior when it does occur.  Asymptomatic at present time.  No popping or clicking with jaw opening/closing Eyes:     Conjunctiva/sclera: Conjunctivae normal.     Pupils: Pupils are equal, round, and reactive to light.  Neck:     Vascular: No carotid bruit.   Cardiovascular:     Rate and Rhythm: Normal rate and regular rhythm.     Heart sounds: Normal heart sounds.  Pulmonary:     Effort: Pulmonary effort is normal.     Breath sounds: Normal breath sounds.  Abdominal:     General: There is no distension.     Palpations: Abdomen is soft. There is no pulsatile mass.     Tenderness: There is no abdominal tenderness (Minimal discomfort left lower quadrant but not painful, no rebound or guarding.).  Musculoskeletal:     Right lower leg: No edema.     Left lower leg: No edema.  Skin:    General: Skin is warm and dry.  Neurological:     Mental Status: She is alert and oriented to person, place, and time.  Psychiatric:        Mood and Affect: Mood normal.        Behavior: Behavior normal.       Assessment & Plan:  Kristen Manning is a 87 y.o. female . Change in stool Abdominal bloating Diverticulosis History of constipation  -Diverticulosis without diverticulitis.  Suspect constipation as cause of bloating, left lower quadrant symptoms.  Overall reassuring exam.  Bowel regimen discussed with MiraLAX every other day, up to daily, or temporary cessation if loose stools.  RTC precautions if worsening symptoms  Left ear pain TMJ syndrome  -Reassuring exam at present, possible TMJ syndrome based on location, handout given, RTC precautions if persistent or worsening, or auditory symptoms.  No orders of the defined types were placed in this encounter.  Patient Instructions  I recommend taking miralax every other day for now. If diarrhea or loose stools - can decrease to every 2-3 days. Keep up the good work with the vegetables and fiber in diet, and drink plenty of fluids water.  If any persistent lower abdominal pain, especially if accompanied by stool changes, fevers, chills or other new symptoms, return for recheck here, urgent care or emergency  room if needed.    Pain in the ear could be related to TMJ syndrome but if that pain persists  please return for recheck.  The exam of your ear appears okay.   Temporomandibular Joint Syndrome  Temporomandibular joint syndrome (TMJ syndrome) is a condition that causes pain in the temporomandibular joints. These joints are located near your ears and allow your jaw to open and close. For people with TMJ syndrome, chewing,biting, or other movements of the jaw can be difficult or painful. TMJ syndrome is often mild and goes away within a few weeks. However, sometimes the condition becomes a long-term (chronic) problem. What are the causes? This condition may be caused by: Grinding your teeth or clenching your jaw. Some people do this when they are under stress. Arthritis. Injury to the jaw. Head or neck injury. Teeth or dentures that are not aligned well. In some cases, the cause of TMJ syndrome may not be known. What are the signs or symptoms? The most common symptom of this condition is an aching pain on the side of the head in the area of the TMJ. Other symptoms may include: Pain when moving your jaw, such as when chewing or biting. Being unable to open your jaw all the way. Making a clicking sound when you open your mouth. Headache. Earache. Neck or shoulder pain. How is this diagnosed? This condition may be diagnosed based on: Your symptoms and medical history. A physical exam. Your health care provider may check the range of motion of your jaw. Imaging tests, such as X-rays or an MRI. You may also need to see your dentist, who will determine if your teeth and jaware lined up correctly. How is this treated? TMJ syndrome often goes away on its own. If treatment is needed, the options may include: Eating soft foods and applying ice or heat. Medicines to relieve pain or inflammation. Medicines or massage to relax the muscles. A splint, bite plate, or mouthpiece to prevent teeth grinding or jaw clenching. Relaxation techniques or counseling to help reduce stress. A therapy for  pain in which an electrical current is applied to the nerves through the skin (transcutaneous electrical nerve stimulation). Acupuncture. This is sometimes helpful to relieve pain. Jaw surgery. This is rarely needed. Follow these instructions at home:  Eating and drinking Eat a soft diet if you are having trouble chewing. Avoid foods that require a lot of chewing. Do not chew gum. General instructions Take over-the-counter and prescription medicines only as told by your health care provider. If directed, put ice on the painful area. Put ice in a plastic bag. Place a towel between your skin and the bag. Leave the ice on for 20 minutes, 2-3 times a day. Apply a warm, wet cloth (warm compress) to the painful area as directed. Massage your jaw area and do any jaw stretching exercises as told by your health care provider. If you were given a splint, bite plate, or mouthpiece, wear it as told by your health care provider. Keep all follow-up visits as told by your health care provider. This is important. Contact a health care provider if: You are having trouble eating. You have new or worsening symptoms. Get help right away if: Your jaw locks open or closed. Summary Temporomandibular joint syndrome (TMJ syndrome) is a condition that causes pain in the temporomandibular joints. These joints are located near your ears and allow your jaw to open and close. TMJ syndrome is often mild and goes away within a  few weeks. However, sometimes the condition becomes a long-term (chronic) problem. Symptoms include an aching pain on the side of the head in the area of the TMJ, pain when chewing or biting, and being unable to open your jaw all the way. You may also make a clicking sound when you open your mouth. TMJ syndrome often goes away on its own. If treatment is needed, it may include medicines to relieve pain, reduce inflammation, or relax the muscles. A splint, bite plate, or mouthpiece may also be used  to prevent teeth grinding or jaw clenching. This information is not intended to replace advice given to you by your health care provider. Make sure you discuss any questions you have with your healthcare provider. Document Revised: 03/07/2017 Document Reviewed: 02/04/2017 Elsevier Patient Education  2022 [place].   Diverticulosis  Diverticulosis is a condition that develops when small pouches (diverticula) form in the wall of the large intestine (colon). The colon is where water is absorbed and stool (feces) is formed. The pouches form when the inside layer of the colon pushes through weak spots in the outer layers of the colon. You may have a few pouches or manyof them. The pouches usually do not cause problems unless they become inflamed orinfected. When this happens, the condition is called diverticulitis. What are the causes? The cause of this condition is not known. What increases the risk? The following factors may make you more likely to develop this condition: Being older than age 51. Your risk for this condition increases with age. Diverticulosis is rare among people younger than age 61. By age 51, many people have it. Eating a low-fiber diet. Having frequent constipation. Being overweight. Not getting enough exercise. Smoking. Taking over-the-counter pain medicines, like aspirin and ibuprofen. Having a family history of diverticulosis. What are the signs or symptoms? In most people, there are no symptoms of this condition. If you do have symptoms, they may include: Bloating. Cramps in the abdomen. Constipation or diarrhea. Pain in the lower left side of the abdomen. How is this diagnosed? Because diverticulosis usually has no symptoms, it is most often diagnosed during an exam for other colon problems. The condition may be diagnosed by: Using a flexible scope to examine the colon (colonoscopy). Taking an X-ray of the colon after dye has been put into the colon (barium  enema). Having a CT scan. How is this treated? You may not need treatment for this condition. Your health care provider may recommend treatment to prevent problems. You may need treatment if you have symptoms or if you previously had diverticulitis. Treatment may include: Eating a high-fiber diet. Taking a fiber supplement. Taking a live bacteria supplement (probiotic). Taking medicine to relax your colon. Follow these instructions at home: Medicines Take over-the-counter and prescription medicines only as told by your health care provider. If told by your health care provider, take a fiber supplement or probiotic. Constipation prevention Your condition may cause constipation. To prevent or treat constipation, you may need to: Drink enough fluid to keep your urine pale yellow. Take over-the-counter or prescription medicines. Eat foods that are high in fiber, such as beans, whole grains, and fresh fruits and vegetables. Limit foods that are high in fat and processed sugars, such as fried or sweet foods.  General instructions Try not to strain when you have a bowel movement. Keep all follow-up visits as told by your health care provider. This is important. Contact a health care provider if you: Have pain in your  abdomen. Have bloating. Have cramps. Have not had a bowel movement in 3 days. Get help right away if: Your pain gets worse. Your bloating becomes very bad. You have a fever or chills, and your symptoms suddenly get worse. You vomit. You have bowel movements that are bloody or black. You have bleeding from your rectum. Summary Diverticulosis is a condition that develops when small pouches (diverticula) form in the wall of the large intestine (colon). You may have a few pouches or many of them. This condition is most often diagnosed during an exam for other colon problems. Treatment may include increasing the fiber in your diet, taking supplements, or taking medicines. This  information is not intended to replace advice given to you by your health care provider. Make sure you discuss any questions you have with your healthcare provider. Document Revised: 07/23/2018 Document Reviewed: 07/23/2018 Elsevier Patient Education  [place]. Abdominal Bloating When you have abdominal bloating, your abdomen may feel full, tight, or painful. It may also look bigger than normal or swollen (distended). Common causes of abdominal bloating include: Swallowing air. Constipation. Problems digesting food. Eating too much. Irritable bowel syndrome. This is a condition that affects the large intestine. Lactose intolerance. This is an inability to digest lactose, a natural sugar in dairy products. Celiac disease. This is a condition that affects the ability to digest gluten, a protein found in some grains. Gastroparesis. This is a condition that slows down the movement of food in the stomach and small intestine. It is more common in people with diabetes mellitus. Gastroesophageal reflux disease (GERD). This is a digestive condition that makes stomach acid flow back into the esophagus. Urinary retention. This means that the body is holding onto urine, and the bladder cannot be emptied all the way. Follow these instructions at home: Eating and drinking Avoid eating too much. Try not to swallow air while talking or eating. Avoid eating while lying down. Avoid these foods and drinks: Foods that cause gas, such as broccoli, cabbage, cauliflower, and baked beans. Carbonated drinks. Hard candy. Chewing gum. Medicines Take over-the-counter and prescription medicines only as told by your health care provider. Take probiotic medicines. These medicines contain live bacteria or yeasts that can help digestion. Take coated peppermint oil capsules. Activity Try to exercise regularly. Exercise may help to relieve bloating that is caused by gas and relieve constipation. General  instructions Keep all follow-up visits as told by your health care provider. This is important. Contact a health care provider if: You have nausea and vomiting. You have diarrhea. You have abdominal pain. You have unusual weight loss or weight gain. You have severe pain, and medicines do not help. Get help right away if: You have severe chest pain. You have trouble breathing. You have shortness of breath. You have trouble urinating. You have darker urine than normal. You have blood in your stools or have dark, tarry stools. Summary Abdominal bloating means that the abdomen is swollen. Common causes of abdominal bloating are swallowing air, constipation, and problems digesting food. Avoid eating too much and avoid swallowing air. Avoid foods that cause gas, carbonated drinks, hard candy, and chewing gum. This information is not intended to replace advice given to you by your health care provider. Make sure you discuss any questions you have with your healthcare provider. Document Revised: 04/13/2018 Document Reviewed: 01/26/2016 Elsevier Patient Education  2021 [place].    Signed,   Merri Ray, MD [place] 08/23/20 4:49 PM

## 2020-08-23 NOTE — Patient Instructions (Addendum)
I recommend taking miralax every other day for now. If diarrhea or loose stools - can decrease to every 2-3 days. Keep up the good work with the vegetables and fiber in diet, and drink plenty of fluids water.  If any persistent lower abdominal pain, especially if accompanied by stool changes, fevers, chills or other new symptoms, return for recheck here, urgent care or emergency room if needed.    Pain in the ear could be related to TMJ syndrome but if that pain persists please return for recheck.  The exam of your ear appears okay.   Temporomandibular Joint Syndrome  Temporomandibular joint syndrome (TMJ syndrome) is a condition that causes pain in the temporomandibular joints. These joints are located near your ears and allow your jaw to open and close. For people with TMJ syndrome, chewing,biting, or other movements of the jaw can be difficult or painful. TMJ syndrome is often mild and goes away within a few weeks. However, sometimes the condition becomes a long-term (chronic) problem. What are the causes? This condition may be caused by: Grinding your teeth or clenching your jaw. Some people do this when they are under stress. Arthritis. Injury to the jaw. Head or neck injury. Teeth or dentures that are not aligned well. In some cases, the cause of TMJ syndrome may not be known. What are the signs or symptoms? The most common symptom of this condition is an aching pain on the side of the head in the area of the TMJ. Other symptoms may include: Pain when moving your jaw, such as when chewing or biting. Being unable to open your jaw all the way. Making a clicking sound when you open your mouth. Headache. Earache. Neck or shoulder pain. How is this diagnosed? This condition may be diagnosed based on: Your symptoms and medical history. A physical exam. Your health care provider may check the range of motion of your jaw. Imaging tests, such as X-rays or an MRI. You may also need to see  your dentist, who will determine if your teeth and jaware lined up correctly. How is this treated? TMJ syndrome often goes away on its own. If treatment is needed, the options may include: Eating soft foods and applying ice or heat. Medicines to relieve pain or inflammation. Medicines or massage to relax the muscles. A splint, bite plate, or mouthpiece to prevent teeth grinding or jaw clenching. Relaxation techniques or counseling to help reduce stress. A therapy for pain in which an electrical current is applied to the nerves through the skin (transcutaneous electrical nerve stimulation). Acupuncture. This is sometimes helpful to relieve pain. Jaw surgery. This is rarely needed. Follow these instructions at home:  Eating and drinking Eat a soft diet if you are having trouble chewing. Avoid foods that require a lot of chewing. Do not chew gum. General instructions Take over-the-counter and prescription medicines only as told by your health care provider. If directed, put ice on the painful area. Put ice in a plastic bag. Place a towel between your skin and the bag. Leave the ice on for 20 minutes, 2-3 times a day. Apply a warm, wet cloth (warm compress) to the painful area as directed. Massage your jaw area and do any jaw stretching exercises as told by your health care provider. If you were given a splint, bite plate, or mouthpiece, wear it as told by your health care provider. Keep all follow-up visits as told by your health care provider. This is important. Contact a health care  provider if: You are having trouble eating. You have new or worsening symptoms. Get help right away if: Your jaw locks open or closed. Summary Temporomandibular joint syndrome (TMJ syndrome) is a condition that causes pain in the temporomandibular joints. These joints are located near your ears and allow your jaw to open and close. TMJ syndrome is often mild and goes away within a few weeks. However,  sometimes the condition becomes a long-term (chronic) problem. Symptoms include an aching pain on the side of the head in the area of the TMJ, pain when chewing or biting, and being unable to open your jaw all the way. You may also make a clicking sound when you open your mouth. TMJ syndrome often goes away on its own. If treatment is needed, it may include medicines to relieve pain, reduce inflammation, or relax the muscles. A splint, bite plate, or mouthpiece may also be used to prevent teeth grinding or jaw clenching. This information is not intended to replace advice given to you by your health care provider. Make sure you discuss any questions you have with your healthcare provider. Document Revised: 03/07/2017 Document Reviewed: 02/04/2017 Elsevier Patient Education  2022 [place].   Diverticulosis  Diverticulosis is a condition that develops when small pouches (diverticula) form in the wall of the large intestine (colon). The colon is where water is absorbed and stool (feces) is formed. The pouches form when the inside layer of the colon pushes through weak spots in the outer layers of the colon. You may have a few pouches or manyof them. The pouches usually do not cause problems unless they become inflamed orinfected. When this happens, the condition is called diverticulitis. What are the causes? The cause of this condition is not known. What increases the risk? The following factors may make you more likely to develop this condition: Being older than age 45. Your risk for this condition increases with age. Diverticulosis is rare among people younger than age 31. By age 45, many people have it. Eating a low-fiber diet. Having frequent constipation. Being overweight. Not getting enough exercise. Smoking. Taking over-the-counter pain medicines, like aspirin and ibuprofen. Having a family history of diverticulosis. What are the signs or symptoms? In most people, there are no  symptoms of this condition. If you do have symptoms, they may include: Bloating. Cramps in the abdomen. Constipation or diarrhea. Pain in the lower left side of the abdomen. How is this diagnosed? Because diverticulosis usually has no symptoms, it is most often diagnosed during an exam for other colon problems. The condition may be diagnosed by: Using a flexible scope to examine the colon (colonoscopy). Taking an X-ray of the colon after dye has been put into the colon (barium enema). Having a CT scan. How is this treated? You may not need treatment for this condition. Your health care provider may recommend treatment to prevent problems. You may need treatment if you have symptoms or if you previously had diverticulitis. Treatment may include: Eating a high-fiber diet. Taking a fiber supplement. Taking a live bacteria supplement (probiotic). Taking medicine to relax your colon. Follow these instructions at home: Medicines Take over-the-counter and prescription medicines only as told by your health care provider. If told by your health care provider, take a fiber supplement or probiotic. Constipation prevention Your condition may cause constipation. To prevent or treat constipation, you may need to: Drink enough fluid to keep your urine pale yellow. Take over-the-counter or prescription medicines. Eat foods that are high in  fiber, such as beans, whole grains, and fresh fruits and vegetables. Limit foods that are high in fat and processed sugars, such as fried or sweet foods.  General instructions Try not to strain when you have a bowel movement. Keep all follow-up visits as told by your health care provider. This is important. Contact a health care provider if you: Have pain in your abdomen. Have bloating. Have cramps. Have not had a bowel movement in 3 days. Get help right away if: Your pain gets worse. Your bloating becomes very bad. You have a fever or chills, and your  symptoms suddenly get worse. You vomit. You have bowel movements that are bloody or black. You have bleeding from your rectum. Summary Diverticulosis is a condition that develops when small pouches (diverticula) form in the wall of the large intestine (colon). You may have a few pouches or many of them. This condition is most often diagnosed during an exam for other colon problems. Treatment may include increasing the fiber in your diet, taking supplements, or taking medicines. This information is not intended to replace advice given to you by your health care provider. Make sure you discuss any questions you have with your healthcare provider. Document Revised: 07/23/2018 Document Reviewed: 07/23/2018 Elsevier Patient Education  [place]. Abdominal Bloating When you have abdominal bloating, your abdomen may feel full, tight, or painful. It may also look bigger than normal or swollen (distended). Common causes of abdominal bloating include: Swallowing air. Constipation. Problems digesting food. Eating too much. Irritable bowel syndrome. This is a condition that affects the large intestine. Lactose intolerance. This is an inability to digest lactose, a natural sugar in dairy products. Celiac disease. This is a condition that affects the ability to digest gluten, a protein found in some grains. Gastroparesis. This is a condition that slows down the movement of food in the stomach and small intestine. It is more common in people with diabetes mellitus. Gastroesophageal reflux disease (GERD). This is a digestive condition that makes stomach acid flow back into the esophagus. Urinary retention. This means that the body is holding onto urine, and the bladder cannot be emptied all the way. Follow these instructions at home: Eating and drinking Avoid eating too much. Try not to swallow air while talking or eating. Avoid eating while lying down. Avoid these foods and drinks: Foods that  cause gas, such as broccoli, cabbage, cauliflower, and baked beans. Carbonated drinks. Hard candy. Chewing gum. Medicines Take over-the-counter and prescription medicines only as told by your health care provider. Take probiotic medicines. These medicines contain live bacteria or yeasts that can help digestion. Take coated peppermint oil capsules. Activity Try to exercise regularly. Exercise may help to relieve bloating that is caused by gas and relieve constipation. General instructions Keep all follow-up visits as told by your health care provider. This is important. Contact a health care provider if: You have nausea and vomiting. You have diarrhea. You have abdominal pain. You have unusual weight loss or weight gain. You have severe pain, and medicines do not help. Get help right away if: You have severe chest pain. You have trouble breathing. You have shortness of breath. You have trouble urinating. You have darker urine than normal. You have blood in your stools or have dark, tarry stools. Summary Abdominal bloating means that the abdomen is swollen. Common causes of abdominal bloating are swallowing air, constipation, and problems digesting food. Avoid eating too much and avoid swallowing air. Avoid foods that cause gas,  carbonated drinks, hard candy, and chewing gum. This information is not intended to replace advice given to you by your health care provider. Make sure you discuss any questions you have with your healthcare provider. Document Revised: 04/13/2018 Document Reviewed: 01/26/2016 Elsevier Patient Education  2021 [employer].

## 2020-08-26 ENCOUNTER — Encounter: Payer: Self-pay | Admitting: Family Medicine

## 2020-08-28 ENCOUNTER — Ambulatory Visit (INDEPENDENT_AMBULATORY_CARE_PROVIDER_SITE_OTHER): Payer: Medicare Other | Admitting: *Deleted

## 2020-08-28 DIAGNOSIS — Z0000 Encounter for general adult medical examination without abnormal findings: Secondary | ICD-10-CM | POA: Diagnosis not present

## 2020-08-28 NOTE — Progress Notes (Signed)
Subjective:   Kristen Manning is a 87 y.o. female who presents for Medicare Annual (Subsequent) preventive examination.  I connected with  Berenice Primas on 08/28/20 by a telephone enabled telemedicine application and verified that I am speaking with the correct person using two identifiers.   I discussed the limitations of evaluation and management by telemedicine. The patient expressed understanding and agreed to proceed.   Review of Systems    NA Cardiac Risk Factors include: advanced age (>80mn, >>52women);hypertension     Objective:    Today's Vitals   There is no height or weight on file to calculate BMI.  Advanced Directives 08/28/2020 08/10/2020 03/20/2020 03/12/2019 03/10/2018 11/18/2017 08/02/2017  Does Patient Have a Medical Advance Directive? - Yes Yes Yes Yes No No  Type of [place]amed[place] will [place] will - [place] will - - -  Does patient want to make changes to medical advance directive? - - - - No - Patient declined - -  Copy of [place] Chart? Yes - validated most recent copy scanned in chart (See row information) No - copy requested - - - - -  Would patient like information on creating a medical advance directive? - - - - No - Patient declined No - Patient declined -  Pre-existing out of facility DNR order (yellow form or pink MOST form) - - - - - - -    Current Medications (verified) Outpatient Encounter Medications as of 08/28/2020  Medication Sig   acetaminophen (TYLENOL) 500 MG tablet Take 2 tablets (1,000 mg total) by mouth every 6 (six) hours as needed.   allopurinol (ZYLOPRIM) 300 MG tablet Take 300 mg by mouth as needed.    b complex vitamins tablet Take 1 tablet by mouth daily.   Calcium 600-200 MG-UNIT tablet Take 1 tablet by mouth daily.   Coenzyme Q10 100 MG capsule Take 100 mg by mouth daily.    ezetimibe (ZETIA) 10 MG tablet TAKE ONE  TABLET BY MOUTH EVERY MORNING   fenofibrate 160 MG tablet TAKE ONE TABLET BY MOUTH EVERY MORNING   furosemide (LASIX) 40 MG tablet Take 1 tablet (40 mg total) by mouth daily.   levothyroxine (SYNTHROID) 88 MCG tablet TAKE ONE TABLET BY MOUTH BEFORE BREAKFAST   loratadine (CLARITIN) 10 MG tablet Take 1 tablet (10 mg total) by mouth daily.   Polyethylene Glycol 3350 (MIRALAX PO) Take 17 g by mouth daily as needed.   triamcinolone (KENALOG) 0.1 % Apply topically 2 (two) times daily.   triamcinolone (NASACORT) 55 MCG/ACT AERO nasal inhaler Place 2 sprays into the nose daily. (Patient taking differently: Place 2 sprays into the nose daily as needed.)   No facility-administered encounter medications on file as of 08/28/2020.    Allergies (verified) Aspirin, Codeine, Statins, and Tizanidine hcl   History: Past Medical History:  Diagnosis Date   Arthritis    Bacterial infection    in lungs in Jan 2015   Bilateral lower extremity edema    [place]    skin cancer, back, legs melonama, sees Dr WLisabeth Pick derm   Chronic back pain    GERD (gastroesophageal reflux disease)    takes Omeprazole daily   Headache(784.0)    History of migraine    last one about 15 yrs ago   Hyperlipidemia    takes Zetia daily   Hypertension    takes HCTZ daily   Joint pain  Joint swelling    Myocardial infarction [facility])    pt states EKG always shows infarct but no change from previous ekg;never knew anything about it   Nocturia    Osteoarthritis    Pneumonia    hx of-many yrs ago   Pneumonia    PONV (postoperative nausea and vomiting)    Shortness of breath    with exertion   Urinary frequency    Urinary incontinence    Urinary urgency    Past Surgical History:  Procedure Laterality Date   bladder tack  01/08/1996   BLEPHAROPLASTY Bilateral    JOINT REPLACEMENT Right 02/22/2013   Knee   OOPHORECTOMY  01/08/1996   only one   REPLACEMENT TOTAL KNEE Left 10/02/2009   TONSILLECTOMY  as a child  ? date   TOTAL KNEE ARTHROPLASTY Right 02/22/2013   Procedure: TOTAL KNEE ARTHROPLASTY;  Surgeon: Vickey Huger, MD;  Location: [place];  Service: Orthopedics;  Laterality: Right;   Family History  Problem Relation Age of Onset   Kidney disease Mother    Heart disease Mother    Hypertension Mother    Varicose Veins Mother    Kidney failure Mother    Alcohol abuse Father    Stroke Father        several    Hypertension Father    Varicose Veins Father    Alcohol abuse Brother    Hyperlipidemia Brother    Hypertension Brother    Early death Brother    Varicose Veins Brother    Bladder Cancer Brother    Heart disease Brother    Hypertension Brother    Arthritis Brother        Gout   Varicose Veins Son    Deep vein thrombosis Son    Colon cancer Son    Breast cancer Maternal Aunt    Pancreatic cancer Neg Hx    Stomach cancer Neg Hx    Esophageal cancer Neg Hx    Liver disease Neg Hx    Social History   Socioeconomic History   Marital status: Widowed    Spouse name: Not on file   Number of children: 4   Years of education: Not on file   Highest education level: Not on file  Occupational History   Not on file  Tobacco Use   Smoking status: Never   Smokeless tobacco: Never  Vaping Use   Vaping Use: Never used  Substance and Sexual Activity   Alcohol use: No   Drug use: No   Sexual activity: Not on file  Other Topics Concern   Not on file  Social History Narrative   Lives with brother    Right handed    Social Determinants of Health   Financial Resource Strain: Low Risk    Difficulty of Paying Living Expenses: Not hard at all  Food Insecurity: No Food Insecurity   Worried About Charity fundraiser in the Last Year: Never true   [place] in the Last Year: Never true  Transportation Needs: No Transportation Needs   Lack of Transportation (Medical): No   Lack of Transportation (Non-Medical): No  Physical Activity: Insufficiently Active   Days of Exercise  per Week: 2 days   Minutes of Exercise per Session: 20 min  Stress: No Stress Concern Present   Feeling of Stress : Not at all  Social Connections: Moderately Isolated   Frequency of Communication with Friends and Family: More than three times a week  Frequency of Social Gatherings with Friends and Family: Three times a week   Attends Religious Services: More than 4 times per year   Active Member of Clubs or Organizations: No   Attends Archivist Meetings: Never   Marital Status: Widowed    Tobacco Counseling Counseling given: Not Answered   Clinical Intake:  Pre-visit preparation completed: Yes  Pain : No/denies pain     Nutritional Risks: None Diabetes: No  How often do you need to have someone help you when you read instructions, pamphlets, or other written materials from your doctor or pharmacy?: 1 - Never  Diabetic?  No  Interpreter Needed?: No  Information entered by :: Leroy Kennedy LPN   Activities of Daily Living In your present state of health, do you have any difficulty performing the following activities: 08/28/2020 07/18/2020  Hearing? Y N  Vision? N N  Difficulty concentrating or making decisions? N N  Walking or climbing stairs? Y N  Dressing or bathing? N N  Doing errands, shopping? N N  Preparing Food and eating ? N -  Using the Toilet? N -  In the past six months, have you accidently leaked urine? N -  Do you have problems with loss of bowel control? N -  Managing your Medications? N -  Housekeeping or managing your Housekeeping? Y -  Some recent data might be hidden    Patient Care Team: Midge Minium, MD as PCP - General (Family Medicine) Vickey Huger, MD as Consulting Physician (Orthopedic Surgery) Harriett Sine, MD as Consulting Physician (Dermatology) Jana Half, DPM as Consulting Physician (Podiatry) Delice Lesch Lezlie Octave, MD as Consulting Physician (Neurology) Lorretta Harp, MD as Consulting Physician  (Cardiology) Madelin Rear, [facility] as Pharmacist (Pharmacist) Madelin Rear, [facility] as Pharmacist (Pharmacist)  Indicate any recent Medical Services you may have received from other than [facility] providers in the past year (date may be approximate).     Assessment:   This is a routine wellness examination for [place].  Hearing/Vision screen Hearing Screening - Comments:: Does have hearing Does not wear all the time Vision Screening - Comments:: Dr. Katy Fitch Up to date  Dietary issues and exercise activities discussed: Current Exercise Habits: Home exercise routine, Type of exercise: walking, Time (Minutes): 20, Frequency (Times/Week): 3, Weekly Exercise (Minutes/Week): 60, Intensity: Mild   Goals Addressed             This Visit's Progress    Patient Stated       Would like to keep being able to drive        Depression Screen PHQ 2/9 Scores 08/28/2020 08/23/2020 07/18/2020 05/08/2020 04/26/2020 11/09/2019 02/09/2019  PHQ - 2 Score 0 0 0 0 0 0 0  PHQ- 9 Score - 2 0 0 0 0 0    Fall Risk Fall Risk  08/28/2020 08/23/2020 07/18/2020 05/08/2020 04/26/2020  Falls in the past year? '1 1 1 '$ 0 1  Number falls in past yr: '1 1 1 '$ 0 0  Comment patient tripped outside taking a limb down and slipped on blanket wrapped around her - - - -  Injury with Fall? 0 0 0 0 0  Risk for fall due to : - History of fall(s) Impaired balance/gait No Fall Risks -  Risk for fall due to: Comment - - - - -  Follow up Falls evaluation completed;Falls prevention discussed Falls evaluation completed - - -    FALL RISK PREVENTION PERTAINING TO THE HOME:  Any stairs in  or around the home? No  If so, are there any without handrails? No  Home free of loose throw rugs in walkways, pet beds, electrical cords, etc? Yes  Adequate lighting in your home to reduce risk of falls? Yes   ASSISTIVE DEVICES UTILIZED TO PREVENT FALLS:  Life alert? No  Use of a cane, walker or w/c? Yes  Grab bars in the bathroom? Yes  Shower chair or bench in  shower? Yes  Elevated toilet seat or a handicapped toilet? Yes   TIMED UP AND GO:  Was the test performed? No .    Cognitive Function:  Normal cognitive status assessed by direct observation by this Nurse Health Advisor. No abnormalities found.   MMSE - Mini Mental State Exam 04/03/2015  Orientation to time 5  Orientation to Place 5  Registration 3  Attention/ Calculation 5  Recall 3  Language- name 2 objects 2  Language- repeat 1  Language- follow 3 step command 3  Language- read & follow direction 1  Write a sentence 1  Copy design 1  Total score 30        Immunizations Immunization History  Administered Date(s) Administered   Fluad Quad(high Dose 65+) 10/26/2018, 11/09/2019   Influenza,inj,Quad PF,6+ Mos 09/21/2014   Influenza-Unspecified 10/29/2016   Janssen (J&J) SARS-COV-2 Vaccination 04/16/2019   Pneumococcal Conjugate-13 09/21/2014   Pneumococcal Polysaccharide-23 11/08/2016   Td 11/26/2012   Zoster Recombinat (Shingrix) 08/01/2017    TDAP status: Up to date  Flu Vaccine status: Up to date  Pneumococcal vaccine status: Up to date  Covid-19 vaccine status: Information provided on how to obtain vaccines.   Qualifies for Shingles Vaccine? Yes   Zostavax completed No   Shingrix Completed?: No.    Education has been provided regarding the importance of this vaccine. Patient has been advised to call insurance company to determine out of pocket expense if they have not yet received this vaccine. Advised may also receive vaccine at local pharmacy or Health Dept. Verbalized acceptance and understanding.  Screening Tests Health Maintenance  Topic Date Due   MAMMOGRAM  09/17/2019   INFLUENZA VACCINE  08/07/2020   COVID-19 Vaccine (2 - Booster for Janssen series) 01/06/2021 (Originally 06/11/2019)   Zoster Vaccines- Shingrix (2 of 2) 01/06/2021 (Originally 09/26/2017)   TETANUS/TDAP  11/27/2022   DEXA SCAN  Completed   PNA vac Low Risk Adult  Completed   HPV  VACCINES  Aged Out    Health Maintenance  Health Maintenance Due  Topic Date Due   MAMMOGRAM  09/17/2019   INFLUENZA VACCINE  08/07/2020    Colorectal cancer screening: No longer required.   Mammogram status: No longer required due to age.  Bone Density patient declined at this time  Lung Cancer Screening: (Low Dose CT Chest recommended if Age 62-80 years, 30 pack-year currently smoking OR have quit w/in 15years.) does not qualify.   Lung Cancer Screening Referral:    Additional Screening:  Hepatitis C Screening: does not qualify  Vision Screening: Recommended annual ophthalmology exams for early detection of glaucoma and other disorders of the eye. Is the patient up to date with their annual eye exam?  Yes  Who is the provider or what is the name of the office in which the patient attends annual eye exams? Dr. Katy Fitch If pt is not established with a provider, would they like to be referred to a provider to establish care? No .   Dental Screening: Recommended annual dental exams for proper oral hygiene  Community Resource Referral / Chronic Care Management: CRR required this visit?  No   CCM required this visit?  No      Plan:     I have personally reviewed and noted the following in the patient's chart:   Medical and social history Use of alcohol, tobacco or illicit drugs  Current medications and supplements including opioid prescriptions.  Functional ability and status Nutritional status Physical activity Advanced directives List of other physicians Hospitalizations, surgeries, and ER visits in previous 12 months Vitals Screenings to include cognitive, depression, and falls Referrals and appointments  In addition, I have reviewed and discussed with patient certain preventive protocols, quality metrics, and best practice recommendations. A written personalized care plan for preventive services as well as general preventive health recommendations were provided  to patient.     Leroy Kennedy, LPN   [id number]   Nurse Notes: na

## 2020-08-28 NOTE — Patient Instructions (Signed)
Kristen Manning , Thank you for taking time to come for your Medicare Wellness Visit. I appreciate your ongoing commitment to your health goals. Please review the following plan we discussed and let me know if I can assist you in the future.   Screening recommendations/referrals: Colonoscopy: no longer required Mammogram: no longer required Bone Density: Education provided Recommended yearly ophthalmology/optometry visit for glaucoma screening and checkup Recommended yearly dental visit for hygiene and checkup  Vaccinations: Influenza vaccine: up to date Pneumococcal vaccine: up to date Tdap vaccine: up to date Shingles vaccine: 1 of   2 had reaction  Advanced directives:  on file  Conditions/risks identified:      Preventive Care 85 Years and Older, Female Preventive care refers to lifestyle choices and visits with your health care provider that can promote health and wellness. What does preventive care include? A yearly physical exam. This is also called an annual well check. Dental exams once or twice a year. Routine eye exams. Ask your health care provider how often you should have your eyes checked. Personal lifestyle choices, including: Daily care of your teeth and gums. Regular physical activity. Eating a healthy diet. Avoiding tobacco and drug use. Limiting alcohol use. Practicing safe sex. Taking low-dose aspirin every day. Taking vitamin and mineral supplements as recommended by your health care provider. What happens during an annual well check? The services and screenings done by your health care provider during your annual well check will depend on your age, overall health, lifestyle risk factors, and family history of disease. Counseling  Your health care provider may ask you questions about your: Alcohol use. Tobacco use. Drug use. Emotional well-being. Home and relationship well-being. Sexual activity. Eating habits. History of falls. Memory and ability to  understand (cognition). Work and work Statistician. Reproductive health. Screening  You may have the following tests or measurements: Height, weight, and BMI. Blood pressure. Lipid and cholesterol levels. These may be checked every 5 years, or more frequently if you are over 65 years old. Skin check. Lung cancer screening. You may have this screening every year starting at age 45 if you have a 30-pack-year history of smoking and currently smoke or have quit within the past 15 years. Fecal occult blood test (FOBT) of the stool. You may have this test every year starting at age 77. Flexible sigmoidoscopy or colonoscopy. You may have a sigmoidoscopy every 5 years or a colonoscopy every 10 years starting at age 28. Hepatitis C blood test. Hepatitis B blood test. Sexually transmitted disease (STD) testing. Diabetes screening. This is done by checking your blood sugar (glucose) after you have not eaten for a while (fasting). You may have this done every 1-3 years. Bone density scan. This is done to screen for osteoporosis. You may have this done starting at age 44. Mammogram. This may be done every 1-2 years. Talk to your health care provider about how often you should have regular mammograms. Talk with your health care provider about your test results, treatment options, and if necessary, the need for more tests. Vaccines  Your health care provider may recommend certain vaccines, such as: Influenza vaccine. This is recommended every year. Tetanus, diphtheria, and acellular pertussis (Tdap, Td) vaccine. You may need a Td booster every 10 years. Zoster vaccine. You may need this after age 64. Pneumococcal 13-valent conjugate (PCV13) vaccine. One dose is recommended after age 76. Pneumococcal polysaccharide (PPSV23) vaccine. One dose is recommended after age 82. Talk to your health care provider about which  screenings and vaccines you need and how often you need them. This information is not  intended to replace advice given to you by your health care provider. Make sure you discuss any questions you have with your health care provider. Document Released: 01/20/2015 Document Revised: 09/13/2015 Document Reviewed: 10/25/2014 Elsevier Interactive Patient Education  2017 [place] Prevention in the Home Falls can cause injuries. They can happen to people of all ages. There are many things you can do to make your home safe and to help prevent falls. What can I do on the outside of my home? Regularly fix the edges of walkways and driveways and fix any cracks. Remove anything that might make you trip as you walk through a door, such as a raised step or threshold. Trim any bushes or trees on the path to your home. Use bright outdoor lighting. Clear any walking paths of anything that might make someone trip, such as rocks or tools. Regularly check to see if handrails are loose or broken. Make sure that both sides of any steps have handrails. Any raised decks and porches should have guardrails on the edges. Have any leaves, snow, or ice cleared regularly. Use sand or salt on walking paths during winter. Clean up any spills in your garage right away. This includes oil or grease spills. What can I do in the bathroom? Use night lights. Install grab bars by the toilet and in the tub and shower. Do not use towel bars as grab bars. Use non-skid mats or decals in the tub or shower. If you need to sit down in the shower, use a plastic, non-slip stool. Keep the floor dry. Clean up any water that spills on the floor as soon as it happens. Remove soap buildup in the tub or shower regularly. Attach bath mats securely with double-sided non-slip rug tape. Do not have throw rugs and other things on the floor that can make you trip. What can I do in the bedroom? Use night lights. Make sure that you have a light by your bed that is easy to reach. Do not use any sheets or blankets that are  too big for your bed. They should not hang down onto the floor. Have a firm chair that has side arms. You can use this for support while you get dressed. Do not have throw rugs and other things on the floor that can make you trip. What can I do in the kitchen? Clean up any spills right away. Avoid walking on wet floors. Keep items that you use a lot in easy-to-reach places. If you need to reach something above you, use a strong step stool that has a grab bar. Keep electrical cords out of the way. Do not use floor polish or wax that makes floors slippery. If you must use wax, use non-skid floor wax. Do not have throw rugs and other things on the floor that can make you trip. What can I do with my stairs? Do not leave any items on the stairs. Make sure that there are handrails on both sides of the stairs and use them. Fix handrails that are broken or loose. Make sure that handrails are as long as the stairways. Check any carpeting to make sure that it is firmly attached to the stairs. Fix any carpet that is loose or worn. Avoid having throw rugs at the top or bottom of the stairs. If you do have throw rugs, attach them to the floor with carpet  tape. Make sure that you have a light switch at the top of the stairs and the bottom of the stairs. If you do not have them, ask someone to add them for you. What else can I do to help prevent falls? Wear shoes that: Do not have high heels. Have rubber bottoms. Are comfortable and fit you well. Are closed at the toe. Do not wear sandals. If you use a stepladder: Make sure that it is fully opened. Do not climb a closed stepladder. Make sure that both sides of the stepladder are locked into place. Ask someone to hold it for you, if possible. Clearly mark and make sure that you can see: Any grab bars or handrails. First and last steps. Where the edge of each step is. Use tools that help you move around (mobility aids) if they are needed. These  include: Canes. Walkers. Scooters. Crutches. Turn on the lights when you go into a dark area. Replace any light bulbs as soon as they burn out. Set up your furniture so you have a clear path. Avoid moving your furniture around. If any of your floors are uneven, fix them. If there are any pets around you, be aware of where they are. Review your medicines with your doctor. Some medicines can make you feel dizzy. This can increase your chance of falling. Ask your doctor what other things that you can do to help prevent falls. This information is not intended to replace advice given to you by your health care provider. Make sure you discuss any questions you have with your health care provider. Document Released: 10/20/2008 Document Revised: 06/01/2015 Document Reviewed: 01/28/2014 Elsevier Interactive Patient Education  2017 [employer].

## 2020-08-31 ENCOUNTER — Telehealth: Payer: Self-pay

## 2020-08-31 ENCOUNTER — Other Ambulatory Visit: Payer: Self-pay | Admitting: Cardiovascular Disease

## 2020-08-31 ENCOUNTER — Other Ambulatory Visit: Payer: Self-pay | Admitting: Family Medicine

## 2020-08-31 NOTE — Progress Notes (Signed)
Opened in error

## 2020-08-31 NOTE — Telephone Encounter (Signed)
Rx(s) sent to pharmacy electronically.  

## 2020-08-31 NOTE — Progress Notes (Addendum)
    Chronic Care Management Pharmacy Assistant   Name: LAURENASHLEY NAKAGAWA  MRN: [id number] DOB: [birth year]  Reason for encounter: Medication coordination   Medications: Outpatient Encounter Medications as of 08/31/2020  Medication Sig   acetaminophen (TYLENOL) 500 MG tablet Take 2 tablets (1,000 mg total) by mouth every 6 (six) hours as needed.   allopurinol (ZYLOPRIM) 300 MG tablet Take 300 mg by mouth as needed.    b complex vitamins tablet Take 1 tablet by mouth daily.   Calcium 600-200 MG-UNIT tablet Take 1 tablet by mouth daily.   Coenzyme Q10 100 MG capsule Take 100 mg by mouth daily.    ezetimibe (ZETIA) 10 MG tablet TAKE ONE TABLET BY MOUTH EVERY MORNING   fenofibrate 160 MG tablet TAKE ONE TABLET BY MOUTH EVERY MORNING   furosemide (LASIX) 40 MG tablet Take 1 tablet (40 mg total) by mouth daily.   levothyroxine (SYNTHROID) 88 MCG tablet TAKE ONE TABLET BY MOUTH BEFORE BREAKFAST   loratadine (CLARITIN) 10 MG tablet Take 1 tablet (10 mg total) by mouth daily.   Polyethylene Glycol 3350 (MIRALAX PO) Take 17 g by mouth daily as needed.   triamcinolone (KENALOG) 0.1 % Apply topically 2 (two) times daily.   triamcinolone (NASACORT) 55 MCG/ACT AERO nasal inhaler Place 2 sprays into the nose daily. (Patient taking differently: Place 2 sprays into the nose daily as needed.)   No facility-administered encounter medications on file as of 08/31/2020.  Reviewed chart for medication changes ahead of medication coordination call.  No OVs, Consults, or hospital visits since last care coordination call/Pharmacist visit. (If appropriate, list visit date, provider name)  No medication changes indicated OR if recent visit, treatment plan here.  BP Readings from Last 3 Encounters:  08/23/20 134/66  08/17/20 (!) 180/100  08/10/20 (!) 154/78    No results found for: HGBA1C   Patient obtains medications through Adherence Packaging  90 Days   Last adherence delivery included: Levothyroxine,  Omeprazole, fenofibrate, eztimbe, furosemide, Tac.   Patient declined N/a last month due to PRN use/additional supply on hand. Explanation of abundance on hand (ie #30 due to overlapping fills or previous adherence issues etc) N/a  Patient is due for next adherence delivery on: 09/13/20 Called patient and reviewed medications and coordinated delivery.   This delivery to include: Omeprazole, fenofibrate, eztimbe, furosemide, Tac.  Patient will need a short fill of N/a prior to adherence delivery. (To align with sync date or if PRN med)   Patient declined the following medications  N/a   Patient needs refills for . Omeprazole, fenofibrate, eztimbe, furosemide, Tac.   Confirmed delivery date of 09/13/20 advised patient that pharmacy will contact them the morning of delivery.    Andee Poles, CMA

## 2020-09-08 ENCOUNTER — Other Ambulatory Visit: Payer: Self-pay | Admitting: Family Medicine

## 2020-09-08 NOTE — Telephone Encounter (Signed)
The original prescription was discontinued on 08/17/2020 by Larina Bras, CMA for the following reason: Error. Renewing this prescription may not be appropriate.

## 2020-10-02 ENCOUNTER — Telehealth: Payer: Self-pay

## 2020-10-02 NOTE — Progress Notes (Addendum)
Chronic Care Management Pharmacy Assistant   Name: Kristen Manning  MRN: [id number] DOB: [birth year]   Reason for Encounter: Medication Coordination Call     Recent office visits:  None noted.   Recent consult visits:  None noted.   Hospital visits: 08/10/20  Medication Reconciliation was completed by comparing discharge summary, patient's EMR and Pharmacy list, and upon discussion with patient.  Admitted to the hospital on 08/10/20 due to Abdominal Pain. Discharge date was 08/10/20. Discharged from [facility].    New?Medications Started at [facility] Discharge:?? No new medications noted.   Medication Changes at Hospital Discharge: No medication changes noted.   Medications Discontinued at Hospital Discharge: No medications were discontinued at discharge.   Medications that remain the same after Hospital Discharge:??  All other medications will remain the same.    Medications: Outpatient Encounter Medications as of 10/02/2020  Medication Sig   acetaminophen (TYLENOL) 500 MG tablet Take 2 tablets (1,000 mg total) by mouth every 6 (six) hours as needed.   allopurinol (ZYLOPRIM) 300 MG tablet Take 300 mg by mouth as needed.    b complex vitamins tablet Take 1 tablet by mouth daily.   Calcium 600-200 MG-UNIT tablet Take 1 tablet by mouth daily.   Coenzyme Q10 100 MG capsule Take 100 mg by mouth daily.    ezetimibe (ZETIA) 10 MG tablet TAKE ONE TABLET BY MOUTH EVERY MORNING   fenofibrate 160 MG tablet TAKE ONE TABLET BY MOUTH EVERY MORNING   furosemide (LASIX) 40 MG tablet Take 1 tablet (40 mg total) by mouth every morning. Need appointment   levothyroxine (SYNTHROID) 88 MCG tablet TAKE ONE TABLET BY MOUTH BEFORE BREAKFAST   loratadine (CLARITIN) 10 MG tablet Take 1 tablet (10 mg total) by mouth daily.   omeprazole (PRILOSEC) 20 MG capsule TAKE ONE CAPSULE BY MOUTH EVERY MORNING   Polyethylene Glycol 3350 (MIRALAX PO) Take 17 g by mouth daily as needed.    triamcinolone (KENALOG) 0.1 % Apply topically 2 (two) times daily.   triamcinolone (NASACORT) 55 MCG/ACT AERO nasal inhaler Place 2 sprays into the nose daily. (Patient taking differently: Place 2 sprays into the nose daily as needed.)   No facility-administered encounter medications on file as of 10/02/2020.    Reviewed chart for medication changes ahead of medication coordination call.  No OVs, Consults, or hospital visits since last care coordination call/Pharmacist visit. (If appropriate, list visit date, provider name)  No medication changes indicated OR if recent visit, treatment plan here.  BP Readings from Last 3 Encounters:  08/23/20 134/66  08/17/20 (!) 180/100  08/10/20 (!) 154/78    No results found for: HGBA1C   Patient obtains medications through Adherence Packaging  30 Days   Last adherence delivery included: (medication name and frequency) Levothyroxine 88 mcg 1 tablet daily Furosemide 40 mg 1 tablet daily in am Omeprazole 20 mg 1 tablet  daily in am Fenofibrate 160 mg 1 tablet daily in am  Ezetimibe 10 mg 1 tablet daily in am  Triamcinolone cream 0.10% apply twice daily as directed   Patient is due for next adherence delivery on: 10/12/20. Called patient and reviewed medications and coordinated delivery.   This delivery to include: Levothyroxine 88 mcg 1 tablet daily Furosemide 40 mg 1 tablet daily in am Omeprazole 20 mg 1 tablet  daily in am Fenofibrate 160 mg 1 tablet daily in am  Ezetimibe 10 mg 1 tablet daily in am   Patient declined Triamcinolone cream 0.10% this  delivery as she reports her legs are doing better and she still has some on hand.    Patient needs refills for Furosemide 40 mg 1 tablet daily from PCP. Called and requested refill from Dr Kennon Holter office [phone]).   Confirmed delivery date of 10/12/20, advised patient that pharmacy will contact them the morning of delivery.   Care Gaps  AWV: done 08/28/20 Colonoscopy: unknown  DM  Eye Exam: N/A DM Foot Exam: N/A Microalbumin: N/A HbgAIC: N/A DEXA: done 10/1016 Mammogram: done 09/17/18 (has been ordered)  Star Rating Drugs: No Star Rating Drugs Noted.  No future appointments.   Jobe Gibbon, [place] Pharmacist Assistant  [phone]  Time Spent: 42 minutes

## 2020-10-03 ENCOUNTER — Telehealth: Payer: Self-pay | Admitting: Cardiovascular Disease

## 2020-10-03 MED ORDER — FUROSEMIDE 40 MG PO TABS: 40.0000 mg | ORAL_TABLET | Freq: Every morning | ORAL | 0 refills | Status: DC

## 2020-10-03 NOTE — Telephone Encounter (Signed)
*  STAT* If patient is at the pharmacy, call can be transferred to refill team.   1. Which medications need to be refilled? (please list name of each medication and dose if known) furosemide (LASIX) 40 MG tablet  2. Which pharmacy/location (including street and city if local pharmacy) is medication to be sent to? [place], [place] AT [place]  3. Do they need a 30 day or 90 day supply? 90   Pharmacy ask that refills be included in

## 2020-10-03 NOTE — Telephone Encounter (Cosign Needed)
Called and requested Furosemide 40 mg #30 refill from Dr Jenness Corner office at [phone].[name] ls,CMA

## 2020-10-05 ENCOUNTER — Other Ambulatory Visit: Payer: Self-pay | Admitting: Cardiovascular Disease

## 2020-10-12 ENCOUNTER — Ambulatory Visit: Payer: Medicare Other

## 2020-10-31 ENCOUNTER — Telehealth: Payer: Self-pay

## 2020-10-31 NOTE — Progress Notes (Signed)
Chronic Care Management Pharmacy Assistant   Name: Kristen Manning  MRN: [id number] DOB: [birth year]   Reason for Encounter: Monthly Medication Coordination Call     Recent office visits:  08/28/20 Medical Annual Wellness Completed.    Recent consult visits:  None noted.    Hospital visits: 08/10/20   Medication Reconciliation was completed by comparing discharge summary, patient's EMR and Pharmacy list, and upon discussion with patient.  Admitted to the hospital on 08/10/20 due to Abdominal Pain. Discharge date was 08/10/20. Discharged from [place]?Medications Started at [facility] Discharge:?? No new medications noted.   Medication Changes at Hospital Discharge: No changes noted.   Medications Discontinued at Hospital Discharge: No medications were discontinued at discharge.   Medications that remain the same after Hospital Discharge:??  All other medications will remain the same.    Medications: Outpatient Encounter Medications as of 10/31/2020  Medication Sig   acetaminophen (TYLENOL) 500 MG tablet Take 2 tablets (1,000 mg total) by mouth every 6 (six) hours as needed.   allopurinol (ZYLOPRIM) 300 MG tablet Take 300 mg by mouth as needed.    b complex vitamins tablet Take 1 tablet by mouth daily.   Calcium 600-200 MG-UNIT tablet Take 1 tablet by mouth daily.   Coenzyme Q10 100 MG capsule Take 100 mg by mouth daily.    ezetimibe (ZETIA) 10 MG tablet TAKE ONE TABLET BY MOUTH EVERY MORNING   fenofibrate 160 MG tablet TAKE ONE TABLET BY MOUTH EVERY MORNING   furosemide (LASIX) 40 MG tablet Take 1 tablet (40 mg total) by mouth every morning. NEED OV.   levothyroxine (SYNTHROID) 88 MCG tablet TAKE ONE TABLET BY MOUTH BEFORE BREAKFAST   loratadine (CLARITIN) 10 MG tablet Take 1 tablet (10 mg total) by mouth daily.   omeprazole (PRILOSEC) 20 MG capsule TAKE ONE CAPSULE BY MOUTH EVERY MORNING   Polyethylene Glycol 3350 (MIRALAX PO) Take 17 g by  mouth daily as needed.   triamcinolone (KENALOG) 0.1 % Apply topically 2 (two) times daily.   triamcinolone (NASACORT) 55 MCG/ACT AERO nasal inhaler Place 2 sprays into the nose daily. (Patient taking differently: Place 2 sprays into the nose daily as needed.)   No facility-administered encounter medications on file as of 10/31/2020.    Reviewed chart for medication changes ahead of medication coordination call.  No OVs, Consults, or hospital visits since last care coordination call/Pharmacist visit. (If appropriate, list visit date, provider name)  No medication changes indicated OR if recent visit, treatment plan here.  BP Readings from Last 3 Encounters:  08/23/20 134/66  08/17/20 (!) 180/100  08/10/20 (!) 154/78    No results found for: HGBA1C   Patient obtains medications through Adherence Packaging  30 Days   Last adherence delivery included: (medication name and frequency)  Omeprazole 20 mg 1 capsule daily  Fenofibrate 160 mg 1 tablet daily  Ezetimibe 10 mg 1 tablet daily  Furosemide 40 mg 1 tablet daily  Tac   Patient is due for next adherence delivery on: 11/13/20. Called patient and reviewed medications and coordinated delivery.  This delivery to include:  Omeprazole 20 mg 1 capsule daily  Fenofibrate 160 mg 1 tablet daily  Ezetimibe 10 mg 1 tablet daily  Furosemide 40 mg 1 tablet daily  Levothyroxine 88 mcg 1 tablet daily  Patient needs refills for: Furosemide 40 mg 1 tablet daily for 30 day supply   Confirmed delivery date of 11/13/20, advised patient that pharmacy  will contact them the morning of delivery.   Care Gaps  AWV: done 08/28/20 Colonoscopy: unknown  DM Eye Exam: done 05/03/19 DM Foot Exam: N/A Microalbumin: N/A HbgAIC: N/A DEXA: done 10/16/16 Mammogram: done 09/17/18  Star Rating Drugs: No Star Rating Drugs noted.   No future appointments.   Jobe Gibbon, [place] Pharmacist Assistant  [phone]  Time Spent: 40  minutes

## 2020-11-06 ENCOUNTER — Other Ambulatory Visit: Payer: Self-pay | Admitting: Cardiovascular Disease

## 2020-11-27 DIAGNOSIS — G8929 Other chronic pain: Secondary | ICD-10-CM | POA: Diagnosis not present

## 2020-11-27 DIAGNOSIS — M25512 Pain in left shoulder: Secondary | ICD-10-CM | POA: Diagnosis not present

## 2020-12-04 ENCOUNTER — Telehealth: Payer: Self-pay | Admitting: Pharmacist

## 2020-12-04 NOTE — Progress Notes (Addendum)
    Chronic Care Management Pharmacy Assistant   Name: Kristen Manning  MRN: [id number] DOB: 28-Oct-1932   Reason for Encounter: Monthly Medication Coordination Call      Medications: Outpatient Encounter Medications as of 12/04/2020  Medication Sig   acetaminophen (TYLENOL) 500 MG tablet Take 2 tablets (1,000 mg total) by mouth every 6 (six) hours as needed.   allopurinol (ZYLOPRIM) 300 MG tablet Take 300 mg by mouth as needed.    b complex vitamins tablet Take 1 tablet by mouth daily.   Calcium 600-200 MG-UNIT tablet Take 1 tablet by mouth daily.   Coenzyme Q10 100 MG capsule Take 100 mg by mouth daily.    ezetimibe (ZETIA) 10 MG tablet TAKE ONE TABLET BY MOUTH EVERY MORNING   fenofibrate 160 MG tablet TAKE ONE TABLET BY MOUTH EVERY MORNING   furosemide (LASIX) 40 MG tablet Take 1 tablet (40 mg total) by mouth daily. NEEDS APPT FOR REFILLS #2   levothyroxine (SYNTHROID) 88 MCG tablet TAKE ONE TABLET BY MOUTH BEFORE BREAKFAST   loratadine (CLARITIN) 10 MG tablet Take 1 tablet (10 mg total) by mouth daily.   omeprazole (PRILOSEC) 20 MG capsule TAKE ONE CAPSULE BY MOUTH EVERY MORNING   Polyethylene Glycol 3350 (MIRALAX PO) Take 17 g by mouth daily as needed.   triamcinolone (KENALOG) 0.1 % Apply topically 2 (two) times daily.   triamcinolone (NASACORT) 55 MCG/ACT AERO nasal inhaler Place 2 sprays into the nose daily. (Patient taking differently: Place 2 sprays into the nose daily as needed.)   No facility-administered encounter medications on file as of 12/04/2020.    Reviewed chart for medication changes ahead of medication coordination call.  No OVs, Consults, or hospital visits since last care coordination call/Pharmacist visit. (If appropriate, list visit date, provider name)  No medication changes indicated OR if recent visit, treatment plan here.  BP Readings from Last 3 Encounters:  08/23/20 134/66  08/17/20 (!) 180/100  08/10/20 (!) 154/78    No results found for:  HGBA1C   Patient obtains medications through Adherence Packaging  30 Days   Last adherence delivery included: (medication name and frequency)  Omeprazole 20 mg 1 capsule daily  Fenofibrate 160 mg 1 tablet daily  Ezetimibe 10 mg 1 tablet daily  Furosemide 40 mg 1 tablet daily  Levothyroxine 88 mcg 1 tablet daily   Patient is due for next adherence delivery on: 12/12/20. Called patient and reviewed medications and coordinated delivery.  This delivery to include:  Omeprazole 20 mg 1 capsule daily  Fenofibrate 160 mg 1 tablet daily  Ezetimibe 10 mg 1 tablet daily  Furosemide 40 mg 1 tablet daily  Levothyroxine 88 mcg 1 tablet daily Triamcinolone Cream 1%   Patient needs refills for Furosemide 40 mg 30 day supply .  Confirmed delivery date of 12/12/20, advised patient that pharmacy will contact them the morning of delivery.   Care Gaps   AWV: done 08/28/20 Colonoscopy: unknown  DM Eye Exam: done 05/03/19 DM Foot Exam: N/A Microalbumin: N/A HbgAIC: N/A DEXA: done 10/16/16 Mammogram: done 09/17/18   Star Rating Drugs: No Star Rating Drugs Noted  No future appointments.   Jobe Gibbon, [place] Pharmacist Assistant  [phone]  10 minutes spent in review, coordination, and documentation.  Reviewed by: Beverly Milch, PharmD Clinical Pharmacist [phone]

## 2020-12-05 ENCOUNTER — Other Ambulatory Visit: Payer: Self-pay | Admitting: Cardiovascular Disease

## 2020-12-28 ENCOUNTER — Telehealth: Payer: Self-pay | Admitting: Pharmacist

## 2020-12-28 NOTE — Progress Notes (Addendum)
° ° °  Chronic Care Management Pharmacy Assistant   Name: MERLIE NOGA  MRN: [id number] DOB: Jun 27, 1932   Reason for Encounter: Monthly Coordination Call    Medications: Outpatient Encounter Medications as of 12/28/2020  Medication Sig   acetaminophen (TYLENOL) 500 MG tablet Take 2 tablets (1,000 mg total) by mouth every 6 (six) hours as needed.   allopurinol (ZYLOPRIM) 300 MG tablet Take 300 mg by mouth as needed.    b complex vitamins tablet Take 1 tablet by mouth daily.   Calcium 600-200 MG-UNIT tablet Take 1 tablet by mouth daily.   Coenzyme Q10 100 MG capsule Take 100 mg by mouth daily.    ezetimibe (ZETIA) 10 MG tablet TAKE ONE TABLET BY MOUTH EVERY MORNING   fenofibrate 160 MG tablet TAKE ONE TABLET BY MOUTH EVERY MORNING   furosemide (LASIX) 40 MG tablet TAKE ONE TABLET BY MOUTH every morning needs appt for further refills   levothyroxine (SYNTHROID) 88 MCG tablet TAKE ONE TABLET BY MOUTH BEFORE BREAKFAST   loratadine (CLARITIN) 10 MG tablet Take 1 tablet (10 mg total) by mouth daily.   omeprazole (PRILOSEC) 20 MG capsule TAKE ONE CAPSULE BY MOUTH EVERY MORNING   Polyethylene Glycol 3350 (MIRALAX PO) Take 17 g by mouth daily as needed.   triamcinolone (KENALOG) 0.1 % Apply topically 2 (two) times daily.   triamcinolone (NASACORT) 55 MCG/ACT AERO nasal inhaler Place 2 sprays into the nose daily. (Patient taking differently: Place 2 sprays into the nose daily as needed.)   No facility-administered encounter medications on file as of 12/28/2020.   Reviewed chart for medication changes ahead of medication coordination call.  No OVs, Consults, or hospital visits since last care coordination call/Pharmacist visit. (If appropriate, list visit date, provider name)  No medication changes indicated OR if recent visit, treatment plan here.  BP Readings from Last 3 Encounters:  08/23/20 134/66  08/17/20 (!) 180/100  08/10/20 (!) 154/78    No results found for: HGBA1C   Patient  obtains medications through Adherence Packaging  30 Days   Last adherence delivery included: (medication name and frequency)  Omeprazole 20 mg 1 capsule daily  Fenofibrate 160 mg 1 tablet daily  Ezetimibe 10 mg 1 tablet daily  Furosemide 40 mg 1 tablet daily  Levothyroxine 88 mcg 1 tablet daily   Patient is due for next adherence delivery on: 01/10/2021. Called patient and reviewed medications and coordinated delivery.  This delivery to include:  Omeprazole 20 mg 1 capsule daily  Fenofibrate 160 mg 1 tablet daily  Ezetimibe 10 mg 1 tablet daily  Furosemide 40 mg 1 tablet daily  Levothyroxine 88 mcg 1 tablet daily Allopurinol 300 mg 1 tablet daily    Patient declined the following medications (meds) due to (reason) Triamcinolone 0.1% cream as needed - has enough on hand  Patient needs refills for Allopurinol 300 mg 1 tablet daily and Furosemide 40 mg 1 tablet daily for 30 day supply  Confirmed delivery date of 01/10/21, advised patient that pharmacy will contact them the morning of delivery.  Star Rating Drugs: No Star Rating Drugs Noted.  No future appointments.   Jobe Gibbon, [place] Pharmacist Assistant  [phone]    10 minutes spent in review, coordination, and documentation.  Reviewed by: Beverly Milch, PharmD Clinical Pharmacist [phone]

## 2021-01-04 ENCOUNTER — Telehealth: Payer: Self-pay | Admitting: Cardiovascular Disease

## 2021-01-24 ENCOUNTER — Telehealth: Payer: Self-pay

## 2021-01-24 NOTE — Telephone Encounter (Signed)
Caller name:Artisha Deon Pilling   On DPR? :Yes  Call back number:[phone]  Provider they see: Birdie Riddle   Reason for call:Pt has sore throat, headache congestion wants Dr Birdie Riddle to call her something in by the end of the day. I have informed the pt that she will have to have a appt and she said Dr Birdie Riddle will call it in send the message back.........[name]

## 2021-01-24 NOTE — Telephone Encounter (Signed)
Pt definitely needs appt for evaluation.  Even if it's just a telephone visit (she doesn't have video capability).  She should also take a home COVID test if she has one so we have a better idea what we're dealing with

## 2021-01-24 NOTE — Telephone Encounter (Signed)
Pt states she will call us back with results tomorrow

## 2021-01-26 ENCOUNTER — Telehealth: Payer: Self-pay | Admitting: Family Medicine

## 2021-01-26 ENCOUNTER — Ambulatory Visit: Payer: Medicare Other | Admitting: Registered Nurse

## 2021-01-26 DIAGNOSIS — Z03818 Encounter for observation for suspected exposure to other biological agents ruled out: Secondary | ICD-10-CM | POA: Diagnosis not present

## 2021-01-26 DIAGNOSIS — J0100 Acute maxillary sinusitis, unspecified: Secondary | ICD-10-CM | POA: Diagnosis not present

## 2021-01-26 DIAGNOSIS — Z20822 Contact with and (suspected) exposure to covid-19: Secondary | ICD-10-CM | POA: Diagnosis not present

## 2021-01-26 NOTE — Telephone Encounter (Signed)
Spoke to patient and she states she is doing ok at the moment and [facility]. I let her know that if she felt she wasn't getting any better or needed anything to give [place] a call back.

## 2021-01-26 NOTE — Telephone Encounter (Signed)
Pt called in asking to speak to you, states she tested neg for covid and her wt is fine. Her scales are off at home.  She wanted to cancel her appt today with Richard.

## 2021-01-30 ENCOUNTER — Telehealth: Payer: Self-pay | Admitting: Pharmacist

## 2021-01-30 NOTE — Progress Notes (Addendum)
Chronic Care Management Pharmacy Assistant   Name: RUCHY WILDRICK  MRN: [id number] DOB: January 05, 1933   Reason for Encounter: Monthly Medication Coordination Call     Medications: Outpatient Encounter Medications as of 01/30/2021  Medication Sig   acetaminophen (TYLENOL) 500 MG tablet Take 2 tablets (1,000 mg total) by mouth every 6 (six) hours as needed.   allopurinol (ZYLOPRIM) 300 MG tablet Take 300 mg by mouth as needed.    b complex vitamins tablet Take 1 tablet by mouth daily.   Calcium 600-200 MG-UNIT tablet Take 1 tablet by mouth daily.   Coenzyme Q10 100 MG capsule Take 100 mg by mouth daily.    ezetimibe (ZETIA) 10 MG tablet TAKE ONE TABLET BY MOUTH EVERY MORNING   fenofibrate 160 MG tablet TAKE ONE TABLET BY MOUTH EVERY MORNING   furosemide (LASIX) 40 MG tablet TAKE ONE TABLET BY MOUTH ONCE daily   levothyroxine (SYNTHROID) 88 MCG tablet TAKE ONE TABLET BY MOUTH BEFORE BREAKFAST   loratadine (CLARITIN) 10 MG tablet Take 1 tablet (10 mg total) by mouth daily.   omeprazole (PRILOSEC) 20 MG capsule TAKE ONE CAPSULE BY MOUTH EVERY MORNING   Polyethylene Glycol 3350 (MIRALAX PO) Take 17 g by mouth daily as needed.   triamcinolone (KENALOG) 0.1 % Apply topically 2 (two) times daily.   triamcinolone (NASACORT) 55 MCG/ACT AERO nasal inhaler Place 2 sprays into the nose daily. (Patient taking differently: Place 2 sprays into the nose daily as needed.)   No facility-administered encounter medications on file as of 01/30/2021.    Reviewed chart for medication changes ahead of medication coordination call.  No OVs, Consults, or hospital visits since last care coordination call/Pharmacist visit. (If appropriate, list visit date, provider name)  No medication changes indicated OR if recent visit, treatment plan here.  BP Readings from Last 3 Encounters:  08/23/20 134/66  08/17/20 (!) 180/100  08/10/20 (!) 154/78    No results found for: HGBA1C   Patient obtains medications  through Adherence Packaging  30 Days   Last adherence delivery included: (medication name and frequency) Omeprazole 20 mg 1 capsule daily  Fenofibrate 160 mg 1 tablet daily  Ezetimibe 10 mg 1 tablet daily  Furosemide 40 mg 1 tablet daily  Levothyroxine 88 mcg 1 tablet daily Allopurinol 300 mg 1 tablet daily   Patient declined the following medications (meds) due to (reason) Triamcinolone 0.1% cream as needed - has enough on hand  Patient is due for next adherence delivery on: 02/09/21. Called patient and reviewed medications and coordinated delivery.  This delivery to include: Omeprazole 20 mg 1 capsule daily  Fenofibrate 160 mg 1 tablet daily  Ezetimibe 10 mg 1 tablet daily  Furosemide 40 mg 1 tablet daily  Levothyroxine 88 mcg 1 tablet daily Allopurinol 300 mg 1 tablet daily   Patient needs refills for: None  Confirmed delivery date of 02/09/21, advised patient that pharmacy will contact them the morning of delivery.   Care Gaps   AWV: done 08/28/20 Colonoscopy: unknown  DM Eye Exam: done 05/03/19 DM Foot Exam: N/A Microalbumin: N/A HbgAIC: N/A DEXA: done 10/16/16 Mammogram: done 09/17/18   Star Rating Drugs: No Star Rating Drugs Noted.  Future Appointments  Date Time Provider [place]  01/31/2021 11:00 AM Lorretta Harp, MD [facility] [place], [place] Pharmacist Assistant  [phone]  10 minutes spent in review, coordination, and documentation.  Reviewed by: Beverly Milch, PharmD Clinical Pharmacist [phone]

## 2021-01-31 ENCOUNTER — Ambulatory Visit: Payer: Medicare Other | Admitting: Cardiovascular Disease

## 2021-02-28 ENCOUNTER — Other Ambulatory Visit: Payer: Self-pay | Admitting: Cardiovascular Disease

## 2021-02-28 ENCOUNTER — Other Ambulatory Visit: Payer: Self-pay | Admitting: Family

## 2021-02-28 ENCOUNTER — Telehealth: Payer: Self-pay | Admitting: Pharmacist

## 2021-02-28 NOTE — Progress Notes (Addendum)
Chronic Care Management Pharmacy Assistant   Name: Kristen Manning  MRN: [id number] DOB: [birth year]   Reason for Encounter: Monthly Medication Coordination Call   Medications: Outpatient Encounter Medications as of 02/28/2021  Medication Sig   acetaminophen (TYLENOL) 500 MG tablet Take 2 tablets (1,000 mg total) by mouth every 6 (six) hours as needed.   allopurinol (ZYLOPRIM) 300 MG tablet Take 300 mg by mouth as needed.    b complex vitamins tablet Take 1 tablet by mouth daily.   Calcium 600-200 MG-UNIT tablet Take 1 tablet by mouth daily.   Coenzyme Q10 100 MG capsule Take 100 mg by mouth daily.    ezetimibe (ZETIA) 10 MG tablet TAKE ONE TABLET BY MOUTH EVERY MORNING   fenofibrate 160 MG tablet TAKE ONE TABLET BY MOUTH EVERY MORNING   furosemide (LASIX) 40 MG tablet TAKE ONE TABLET BY MOUTH ONCE daily   levothyroxine (SYNTHROID) 88 MCG tablet TAKE ONE TABLET BY MOUTH BEFORE BREAKFAST   loratadine (CLARITIN) 10 MG tablet Take 1 tablet (10 mg total) by mouth daily.   omeprazole (PRILOSEC) 20 MG capsule TAKE ONE CAPSULE BY MOUTH EVERY MORNING   Polyethylene Glycol 3350 (MIRALAX PO) Take 17 g by mouth daily as needed.   triamcinolone (KENALOG) 0.1 % Apply topically 2 (two) times daily.   triamcinolone (NASACORT) 55 MCG/ACT AERO nasal inhaler Place 2 sprays into the nose daily. (Patient taking differently: Place 2 sprays into the nose daily as needed.)   No facility-administered encounter medications on file as of 02/28/2021.    Reviewed chart for medication changes ahead of medication coordination call.  No OVs, Consults, or hospital visits since last care coordination call/Pharmacist visit. (If appropriate, list visit date, provider name)  No medication changes indicated OR if recent visit, treatment plan here.  BP Readings from Last 3 Encounters:  08/23/20 134/66  08/17/20 (!) 180/100  08/10/20 (!) 154/78    No results found for: HGBA1C   Patient obtains medications  through Adherence Packaging  30 Days   Last adherence delivery included: (medication name and frequency)  Omeprazole 20 mg 1 capsule daily  Fenofibrate 160 mg 1 tablet daily  Ezetimibe 10 mg 1 tablet daily  Furosemide 40 mg 1 tablet daily  Levothyroxine 88 mcg 1 tablet daily Allopurinol 300 mg 1 tablet daily   Patient is due for next adherence delivery on: 03/13/21. Called patient and reviewed medications and coordinated delivery.  This delivery to include:  Omeprazole 20 mg 1 capsule daily  Fenofibrate 160 mg 1 tablet daily  Ezetimibe 10 mg 1 tablet daily  Furosemide 40 mg 1 tablet daily  Levothyroxine 88 mcg 1 tablet daily Allopurinol 300 mg 1 tablet daily   Patient needs refills for: Levothyroxine 88 mcg 1 tablet daily, Ezetimibe 10 mg 1 tablet daily , and Fenofibrate 160 mg 1 tablet daily for 30 day supply.  Confirmed delivery date of 03/13/21, advised patient that pharmacy will contact them the morning of delivery.   Care Gaps   AWV: done 08/28/20 Colonoscopy: unknown  DM Eye Exam: done 05/03/19 DM Foot Exam: N/A Microalbumin: N/A HbgAIC: N/A DEXA: done 10/16/16 Mammogram: done 09/17/18   Star Rating Drugs: No Star Rating Drugs Noted.  Future Appointments  Date Time Provider [place]  03/27/2021  3:30 PM Lorretta Harp, MD [facility] [place] [place] Pharmacist Assistant  [phone]  10 minutes spent in review, coordination, and documentation.  Reviewed by: Beverly Milch, PharmD Clinical Pharmacist (  [phone] °

## 2021-03-06 ENCOUNTER — Other Ambulatory Visit: Payer: Self-pay | Admitting: Family Medicine

## 2021-03-27 ENCOUNTER — Ambulatory Visit: Payer: Medicare Other | Admitting: Cardiovascular Disease

## 2021-03-29 ENCOUNTER — Telehealth: Payer: Self-pay | Admitting: Pharmacist

## 2021-03-29 ENCOUNTER — Other Ambulatory Visit: Payer: Self-pay | Admitting: Cardiovascular Disease

## 2021-03-29 NOTE — Progress Notes (Signed)
? ? ?Chronic Care Management ?Pharmacy Assistant  ? ?Name: Kristen Manning  MRN: [id number] DOB: July 19, 1932 ? ? ?Reason for Encounter:  Monthly Medication Coordination Call  ?  ? ? ?Medications: ?Outpatient Encounter Medications as of 03/29/2021  ?Medication Sig  ? acetaminophen (TYLENOL) 500 MG tablet Take 2 tablets (1,000 mg total) by mouth every 6 (six) hours as needed.  ? allopurinol (ZYLOPRIM) 300 MG tablet Take 300 mg by mouth as needed.   ? b complex vitamins tablet Take 1 tablet by mouth daily.  ? Calcium 600-200 MG-UNIT tablet Take 1 tablet by mouth daily.  ? Coenzyme Q10 100 MG capsule Take 100 mg by mouth daily.   ? ezetimibe (ZETIA) 10 MG tablet TAKE ONE TABLET BY MOUTH EVERY MORNING  ? fenofibrate 160 MG tablet TAKE ONE TABLET BY MOUTH EVERY MORNING  ? furosemide (LASIX) 40 MG tablet Take 1 tablet (40 mg total) by mouth daily. KEEP OV.  ? levothyroxine (SYNTHROID) 88 MCG tablet TAKE ONE TABLET BY MOUTH BEFORE BREAKFAST  ? loratadine (CLARITIN) 10 MG tablet Take 1 tablet (10 mg total) by mouth daily.  ? omeprazole (PRILOSEC) 20 MG capsule TAKE ONE CAPSULE BY MOUTH EVERY MORNING  ? Polyethylene Glycol 3350 (MIRALAX PO) Take 17 g by mouth daily as needed.  ? triamcinolone (KENALOG) 0.1 % Apply topically 2 (two) times daily.  ? triamcinolone (NASACORT) 55 MCG/ACT AERO nasal inhaler Place 2 sprays into the nose daily. (Patient taking differently: Place 2 sprays into the nose daily as needed.)  ? ?No facility-administered encounter medications on file as of 03/29/2021.  ? ? ?Reviewed chart for medication changes ahead of medication coordination call. ? ?No OVs, Consults, or hospital visits since last care coordination call/Pharmacist visit. (If appropriate, list visit date, provider name) ? ?No medication changes indicated OR if recent visit, treatment plan here. ? ?BP Readings from Last 3 Encounters:  ?08/23/20 134/66  ?08/17/20 (!) 180/100  ?08/10/20 (!) 154/78  ?  ?No results found for: HGBA1C  ? ?Patient  obtains medications through Adherence Packaging  30 Days  ? ?Last adherence delivery included: (medication name and frequency) ? ?Omeprazole 20 mg 1 capsule daily  ?Fenofibrate 160 mg 1 tablet daily  ?Ezetimibe 10 mg 1 tablet daily  ?Furosemide 40 mg 1 tablet daily  ?Levothyroxine 88 mcg 1 tablet daily ?Allopurinol 300 mg 1 tablet daily  ? ? ? ?Patient is due for next adherence delivery on: 04/11/21. ?Called patient and reviewed medications and coordinated delivery. ? ? ?This delivery to include: ? ?Pt stated she had plenty on hand and did not need a delivery this month for any of her medications. She reported the delivery driver that ws scheduled to come last time did not show up. Informed her I would inform the pharmacy of this and have them look into this and reach back out to her about this matter. She voiced understanding. She said she would call the pharmacy when she gets low and she also has an appt with Dr Gwenlyn Found coming up. ? ? ?Patient needs refills for None. ? ? ?Confirmed delivery date of 04/11/21, advised patient that pharmacy will contact them the morning of delivery. ? ? ?Care Gaps ?  ?AWV: done 08/28/20 ?Colonoscopy: unknown  ?DM Eye Exam: done 05/03/19 ?DM Foot Exam: N/A ?Microalbumin: N/A ?HbgAIC: N/A ?DEXA: done 10/16/16 ?Mammogram: done 09/17/18 ?  ?  ?Star Rating Drugs: ?No Star Rating Drugs Noted. ? ? ?Future Appointments  ?Date Time Provider [place]  ?05/29/2021  9:00 AM Lorretta Harp, MD [facility]  ? ? ? ?[name], CCMA ?Clinical Pharmacist Assistant  ?[phone] ? ? ?

## 2021-04-30 ENCOUNTER — Emergency Department (HOSPITAL_BASED_OUTPATIENT_CLINIC_OR_DEPARTMENT_OTHER)
Admission: EM | Admit: 2021-04-30 | Discharge: 2021-04-30 | Disposition: A | Discharge status: Home / Self Care | Payer: Medicare Other | Attending: Emergency Medicine | Admitting: Emergency Medicine

## 2021-04-30 ENCOUNTER — Encounter (HOSPITAL_BASED_OUTPATIENT_CLINIC_OR_DEPARTMENT_OTHER): Payer: Self-pay

## 2021-04-30 ENCOUNTER — Emergency Department (HOSPITAL_BASED_OUTPATIENT_CLINIC_OR_DEPARTMENT_OTHER): Payer: Medicare Other

## 2021-04-30 DIAGNOSIS — R0789 Other chest pain: Secondary | ICD-10-CM | POA: Insufficient documentation

## 2021-04-30 DIAGNOSIS — R110 Nausea: Secondary | ICD-10-CM | POA: Diagnosis not present

## 2021-04-30 DIAGNOSIS — R519 Headache, unspecified: Secondary | ICD-10-CM | POA: Diagnosis not present

## 2021-04-30 DIAGNOSIS — R0602 Shortness of breath: Secondary | ICD-10-CM | POA: Diagnosis not present

## 2021-04-30 DIAGNOSIS — J3489 Other specified disorders of nose and nasal sinuses: Secondary | ICD-10-CM | POA: Diagnosis not present

## 2021-04-30 DIAGNOSIS — R079 Chest pain, unspecified: Secondary | ICD-10-CM | POA: Diagnosis not present

## 2021-04-30 LAB — CBC
HCT: 41 % (ref 36.0–46.0)
Hemoglobin: 13.8 g/dL (ref 12.0–15.0)
MCH: 30.6 pg (ref 26.0–34.0)
MCHC: 33.7 g/dL (ref 30.0–36.0)
MCV: 90.9 fL (ref 80.0–100.0)
Platelets: 331 10*3/uL (ref 150–400)
RBC: 4.51 MIL/uL (ref 3.87–5.11)
RDW: 12.9 % (ref 11.5–15.5)
WBC: 8.4 10*3/uL (ref 4.0–10.5)
nRBC: 0 % (ref 0.0–0.2)

## 2021-04-30 LAB — TROPONIN I (HIGH SENSITIVITY)
Troponin I (High Sensitivity): 5 ng/L (ref <18)
Troponin I (High Sensitivity): 5 ng/L (ref <18)

## 2021-04-30 LAB — BASIC METABOLIC PANEL
Anion gap: 10 (ref 5–15)
BUN: 17 mg/dL (ref 8–23)
CO2: 23 mmol/L (ref 22–32)
Calcium: 9.6 mg/dL (ref 8.9–10.3)
Chloride: 104 mmol/L (ref 98–111)
Creatinine, Ser: 1.04 mg/dL — ABNORMAL HIGH (ref 0.44–1.00)
GFR, Estimated: 52 mL/min — ABNORMAL LOW (ref >60)
Glucose, Bld: 102 mg/dL — ABNORMAL HIGH (ref 70–99)
Potassium: 4.2 mmol/L (ref 3.5–5.1)
Sodium: 137 mmol/L (ref 135–145)

## 2021-04-30 MED ORDER — DIPHENHYDRAMINE HCL 50 MG/ML IJ SOLN
12.5000 mg | Freq: Once | INTRAMUSCULAR | Status: AC
  Administered 2021-04-30: 12.5 mg via INTRAVENOUS
  Filled 2021-04-30: qty 1

## 2021-04-30 MED ORDER — PROCHLORPERAZINE EDISYLATE 10 MG/2ML IJ SOLN
5.0000 mg | Freq: Once | INTRAMUSCULAR | Status: AC
  Administered 2021-04-30: 5 mg via INTRAVENOUS
  Filled 2021-04-30: qty 2

## 2021-04-30 NOTE — ED Provider Notes (Signed)
?[facility] EMERGENCY DEPARTMENT ?Provider Note ? ? ?CSN: [id number] ?Arrival date & time: 04/30/21  4098 ? ?  ? ?History ? ?Chief Complaint  ?Patient presents with  ? Chest Pain  ? ? ?Kristen Manning is a 88 y.o. female.  Presents to ER due to concern for chest pain.  Patient states that yesterday she had some mild chest discomfort, left-sided, nonradiating, seem to come and go, lasting for few seconds at a time.  Not associated with exertion, occurring at rest.  This morning had mild episode of chest discomfort again and decided to come to ER to get checked out.  She states that she deals with chronic sinus issues, states that she has frequent headaches associated with the sinus pressure.  Headache today is more left-sided but at times was both sided.  Similar to prior.  She states that she tried Tylenol at home and did not have significant improvement.  Headache was not sudden onset, not different than prior.  Currently 4-5 out of 10 in severity. ? ?HPI ? ?  ? ?Home Medications ?Prior to Admission medications   ?Medication Sig Start Date End Date Taking? Authorizing Provider  ?acetaminophen (TYLENOL) 500 MG tablet Take 2 tablets (1,000 mg total) by mouth every 6 (six) hours as needed. 03/20/20   Tedd Sias, PA  ?allopurinol (ZYLOPRIM) 300 MG tablet Take 300 mg by mouth as needed.  06/27/15   [provider]  ?b complex vitamins tablet Take 1 tablet by mouth daily.    [provider]  ?Calcium 600-200 MG-UNIT tablet Take 1 tablet by mouth daily.    [provider]  ?Coenzyme Q10 100 MG capsule Take 100 mg by mouth daily.     [provider]  ?ezetimibe (ZETIA) 10 MG tablet TAKE ONE TABLET BY MOUTH EVERY MORNING 03/06/21   Midge Minium, MD  ?fenofibrate 160 MG tablet TAKE ONE TABLET BY MOUTH EVERY MORNING 03/06/21   Midge Minium, MD  ?furosemide (LASIX) 40 MG tablet TAKE ONE TABLET BY MOUTH ONCE daily. Please keep office visit for further refills.  03/29/21   Lorretta Harp, MD  ?levothyroxine (SYNTHROID) 88 MCG tablet TAKE ONE TABLET BY MOUTH BEFORE BREAKFAST 03/06/21   Midge Minium, MD  ?loratadine (CLARITIN) 10 MG tablet Take 1 tablet (10 mg total) by mouth daily. 01/16/17   Midge Minium, MD  ?omeprazole (PRILOSEC) 20 MG capsule TAKE ONE CAPSULE BY MOUTH EVERY MORNING 09/08/20   Kennyth Arnold, FNP  ?Polyethylene Glycol 3350 (MIRALAX PO) Take 17 g by mouth daily as needed.    [provider]  ?triamcinolone (KENALOG) 0.1 % Apply topically 2 (two) times daily. 02/10/20   [provider]  ?triamcinolone (NASACORT) 55 MCG/ACT AERO nasal inhaler Place 2 sprays into the nose daily. ?Patient taking differently: Place 2 sprays into the nose daily as needed. 11/17/17   Daleen Squibb, MD  ?   ? ?Allergies    ?Aspirin, Codeine, Statins, and Tizanidine hcl   ? ?Review of Systems   ?Review of Systems  ?Constitutional:  Positive for fatigue. Negative for chills and fever.  ?HENT:  Negative for ear pain and sore throat.   ?Eyes:  Negative for pain and visual disturbance.  ?Respiratory:  Negative for cough and shortness of breath.   ?Cardiovascular:  Negative for chest pain and palpitations.  ?Gastrointestinal:  Positive for nausea. Negative for abdominal pain and vomiting.  ?Genitourinary:  Negative for dysuria and hematuria.  ?  Musculoskeletal:  Negative for arthralgias and back pain.  ?Skin:  Negative for color change and rash.  ?Neurological:  Positive for headaches. Negative for seizures and syncope.  ?All other systems reviewed and are negative. ? ?Physical Exam ?Updated Vital Signs ?BP (!) 158/68 (BP Location: Left Arm)   Pulse 70   Temp 97.7 ?F (36.5 ?C) (Oral)   Resp 20   Ht '5\' 5"'$  (1.651 m)   Wt 95.3 kg   SpO2 100%   BMI 34.95 kg/m?  ?Physical Exam ?Vitals and nursing note reviewed.  ?Constitutional:   ?   General: She is not in acute distress. ?   Appearance: She is well-developed.  ?HENT:  ?   Head: Normocephalic and  atraumatic.  ?   Comments: No tenderness to palpation over temporal regions bilaterally ?Eyes:  ?   Conjunctiva/sclera: Conjunctivae normal.  ?Cardiovascular:  ?   Rate and Rhythm: Normal rate and regular rhythm.  ?   Heart sounds: No murmur heard. ?Pulmonary:  ?   Effort: Pulmonary effort is normal. No respiratory distress.  ?   Breath sounds: Normal breath sounds.  ?Abdominal:  ?   Palpations: Abdomen is soft.  ?   Tenderness: There is no abdominal tenderness.  ?Musculoskeletal:     ?   General: No swelling.  ?   Cervical back: Neck supple.  ?Skin: ?   General: Skin is warm and dry.  ?   Capillary Refill: Capillary refill takes less than 2 seconds.  ?Neurological:  ?   Mental Status: She is alert.  ?Psychiatric:     ?   Mood and Affect: Mood normal.  ? ? ?ED Results / Procedures / Treatments   ?Labs ?(all labs ordered are listed, but only abnormal results are displayed) ?Labs Reviewed  ?BASIC METABOLIC PANEL - Abnormal; Notable for the following components:  ?    Result Value  ? Glucose, Bld 102 (*)   ? Creatinine, Ser 1.04 (*)   ? GFR, Estimated 52 (*)   ? All other components within normal limits  ?CBC  ?TROPONIN I (HIGH SENSITIVITY)  ?TROPONIN I (HIGH SENSITIVITY)  ? ? ?EKG ?EKG Interpretation ? ?Date/Time:  Monday April 30 2021 09:16:30 EDT ?Ventricular Rate:  85 ?PR Interval:  156 ?QRS Duration: 88 ?QT Interval:  362 ?QTC Calculation: 430 ?R Axis:   -25 ?Text Interpretation: Normal sinus rhythm Possible Inferior infarct , age undetermined Cannot rule out Anterior infarct , age undetermined Abnormal ECG When compared with ECG of 10-Mar-2018 08:08, No significant change since last tracing Confirmed by Madalyn Rob [id number]) on 04/30/2021 10:42:38 AM ? ?Radiology ?DG Chest 2 View ? ?Result Date: 04/30/2021 ?CLINICAL DATA:  Chest pain, shortness of breath. EXAM: CHEST - 2 VIEW COMPARISON:  Radiograph of March 20, 2020. CT scan of October 07, 2018. Radiograph of September 06, 2016. FINDINGS: Normal cardiac size.  Continued right hilar prominence is noted most likely related to cystic anterior mediastinal mass noted on prior CT scan. Both lungs are clear. The visualized skeletal structures are unremarkable. 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 No acute pulmonary disease is noted. Electronically Signed   By: Marijo Conception M.D.   On: 04/30/2021 09:41  ? ?CT Head Wo Contrast ? ?Result Date: 04/30/2021 ?CLINICAL DATA:  Headache. EXAM: CT HEAD WITHOUT CONTRAST TECHNIQUE: Contiguous axial images were obtained from the base of the skull through the vertex  without intravenous contrast. RADIATION DOSE REDUCTION: This exam was performed according to the departmental dose-optimization program which includes automated exposure control, adjustment of the mA and/or kV according to patient size and/or use of iterative reconstruction technique. COMPARISON:  October 09, 2016. FINDINGS: Brain: Mild chronic ischemic white matter disease is noted. No mass effect or midline shift is noted. Ventricular size is within normal limits. There is no evidence of mass lesion, hemorrhage or acute infarction. Vascular: No hyperdense vessel or unexpected calcification. Skull: Normal. Negative for fracture or focal lesion. Sinuses/Orbits: No acute finding. Other: None. IMPRESSION: No acute intracranial abnormality seen. Electronically Signed   By: Marijo Conception M.D.   On: 04/30/2021 11:28   ? ?Procedures ?Procedures  ? ? ?Medications Ordered in ED ?Medications  ?prochlorperazine (COMPAZINE) injection 5 mg (5 mg Intravenous Given 04/30/21 1104)  ?diphenhydrAMINE (BENADRYL) injection 12.5 mg (12.5 mg Intravenous Given 04/30/21 1105)  ? ? ?ED Course/ Medical Decision Making/ A&P ?  ?                        ?Medical Decision Making ?Amount and/or Complexity of Data Reviewed ?Labs: ordered. ?Radiology: ordered. ? ?Risk ?Prescription drug  management. ? ? ?88 year old lady presenting to ER with chief complaint of chest pain.  She also has associated headache, sinus pressure and some nausea.  On physical exam she appears well in no distress.  Nonfocal neurologic exa

## 2021-04-30 NOTE — ED Triage Notes (Signed)
C/o dizziness yesterday. Also c/o headache, sinus pressure. States has a hx of frozen shoulder & torn rotator cuff in left shoulder, states started having left sided chest pain since yesterday. ?

## 2021-04-30 NOTE — Discharge Instructions (Signed)
Follow-up with your primary care doctor.  Come back to ER if you develop any recurrent chest pain, difficulty breathing or other new concerning symptom. ?

## 2021-05-01 ENCOUNTER — Other Ambulatory Visit: Payer: Self-pay | Admitting: Family

## 2021-05-02 ENCOUNTER — Telehealth: Payer: Self-pay | Admitting: Pharmacist

## 2021-05-02 ENCOUNTER — Ambulatory Visit (INDEPENDENT_AMBULATORY_CARE_PROVIDER_SITE_OTHER): Payer: Medicare Other | Admitting: Family Medicine

## 2021-05-02 ENCOUNTER — Encounter: Payer: Self-pay | Admitting: Family Medicine

## 2021-05-02 VITALS — BP 136/88 | HR 88 | Temp 98.0°F | Resp 16 | Ht 65.0 in | Wt 194.8 lb

## 2021-05-02 DIAGNOSIS — M25512 Pain in left shoulder: Secondary | ICD-10-CM

## 2021-05-02 DIAGNOSIS — R42 Dizziness and giddiness: Secondary | ICD-10-CM | POA: Diagnosis not present

## 2021-05-02 DIAGNOSIS — B9689 Other specified bacterial agents as the cause of diseases classified elsewhere: Secondary | ICD-10-CM

## 2021-05-02 DIAGNOSIS — J329 Chronic sinusitis, unspecified: Secondary | ICD-10-CM | POA: Diagnosis not present

## 2021-05-02 DIAGNOSIS — G8929 Other chronic pain: Secondary | ICD-10-CM

## 2021-05-02 DIAGNOSIS — R110 Nausea: Secondary | ICD-10-CM

## 2021-05-02 MED ORDER — AMOXICILLIN 875 MG PO TABS
875.0000 mg | ORAL_TABLET | Freq: Two times a day (BID) | ORAL | 0 refills | Status: AC
Start: 2021-05-02 — End: 2021-05-12

## 2021-05-02 MED ORDER — TRAMADOL HCL 50 MG PO TABS: 50.0000 mg | ORAL_TABLET | Freq: Three times a day (TID) | ORAL | 0 refills | Status: AC | PRN

## 2021-05-02 MED ORDER — ONDANSETRON HCL 4 MG PO TABS: 4.0000 mg | ORAL_TABLET | Freq: Three times a day (TID) | ORAL | 0 refills | Status: AC | PRN

## 2021-05-02 NOTE — Progress Notes (Signed)
? ? ?  Chronic Care Management ?Pharmacy Assistant  ? ?Name: Kristen Manning  MRN: [id number] DOB: January 13, 1932 ? ? ?Reason for Encounter: Monthly Medication Coordination Call  ? ? ? ?Medications: ?Outpatient Encounter Medications as of 05/02/2021  ?Medication Sig  ? acetaminophen (TYLENOL) 500 MG tablet Take 2 tablets (1,000 mg total) by mouth every 6 (six) hours as needed.  ? allopurinol (ZYLOPRIM) 300 MG tablet Take 300 mg by mouth as needed.   ? b complex vitamins tablet Take 1 tablet by mouth daily.  ? Calcium 600-200 MG-UNIT tablet Take 1 tablet by mouth daily.  ? Coenzyme Q10 100 MG capsule Take 100 mg by mouth daily.   ? ezetimibe (ZETIA) 10 MG tablet TAKE ONE TABLET BY MOUTH EVERY MORNING  ? fenofibrate 160 MG tablet TAKE ONE TABLET BY MOUTH EVERY MORNING  ? furosemide (LASIX) 40 MG tablet TAKE ONE TABLET BY MOUTH ONCE daily. Please keep office visit for further refills.  ? levothyroxine (SYNTHROID) 88 MCG tablet TAKE ONE TABLET BY MOUTH BEFORE BREAKFAST  ? loratadine (CLARITIN) 10 MG tablet Take 1 tablet (10 mg total) by mouth daily.  ? omeprazole (PRILOSEC) 20 MG capsule TAKE ONE CAPSULE BY MOUTH EVERY MORNING  ? Polyethylene Glycol 3350 (MIRALAX PO) Take 17 g by mouth daily as needed.  ? triamcinolone (KENALOG) 0.1 % Apply topically 2 (two) times daily.  ? triamcinolone (NASACORT) 55 MCG/ACT AERO nasal inhaler Place 2 sprays into the nose daily. (Patient taking differently: Place 2 sprays into the nose daily as needed.)  ? ?No facility-administered encounter medications on file as of 05/02/2021.  ? ? ?Reviewed chart for medication changes ahead of medication coordination call. ? ?No OVs, Consults, or hospital visits since last care coordination call/Pharmacist visit. (If appropriate, list visit date, provider name) ? ?No medication changes indicated OR if recent visit, treatment plan here. ? ?BP Readings from Last 3 Encounters:  ?04/30/21 (!) 158/68  ?08/23/20 134/66  ?08/17/20 (!) 180/100  ?  ?No results  found for: HGBA1C  ? ?Patient obtains medications through Adherence Packaging  30 Days  ? ?Last adherence delivery included: (medication name and frequency) ? ?Patient declined (meds) last month ? ?Patient is due for next adherence delivery on: 05/14/21. ?Called patient and reviewed medications and coordinated delivery. ? ?This delivery to include: ? ?Patient stated she is no longer using [facility] and has been going through with a lot of medication changes and prefers to go back to using a retail pharmacy. Patient agrees to stay in the CCM program.  ? ? ? ?Patient needs refills for: Omeprazole (has been requested by pharmacy to specialist). ? ? ? ?Care Gaps ?  ?AWV: done 08/28/20 ?Colonoscopy: unknown  ?DM Eye Exam: done 05/03/19 ?DM Foot Exam: N/A ?Microalbumin: N/A ?HbgAIC: N/A ?DEXA: done 10/16/16 ?Mammogram: done 09/17/18 ? ? ?Star Rating Drugs: ?No Star Rating Drugs Noted ? ? ?Future Appointments  ?Date Time Provider [place]  ?05/02/2021 11:00 AM Midge Minium, MD [facility]  ?05/29/2021  9:00 AM Lorretta Harp, MD [facility]  ? ? ? ?[name], CCMA ?Clinical Pharmacist Assistant  ?[phone] ? ? ?

## 2021-05-02 NOTE — Patient Instructions (Addendum)
Schedule your complete physical in 3 months ?START the Amoxicillin twice daily- take w/ food ?USE the Ondansetron as needed for nausea ?Drink LOTS of fluids to avoid dehydration ?TAKE the Loratadine (Claritin) every day ?Continue to use your nasal sprays ?Take the Tramadol as needed for severe shoulder pain ?Call with any questions or concerns ?Hang in there!!! ?

## 2021-05-02 NOTE — Progress Notes (Signed)
? ?Subjective:  ? ? Patient ID: Kristen Manning, female    DOB: 06/02/1932, 88 y.o.   MRN: [id number] ? ?HPI ?ER f/u- pt went to ER on 4/24 w/ L sided CP.  Also had L sided HA.  EKG was unchanged from previous.  CT head was negative.  She was not TTP over temporal regions so temporal arteritis was thought to be unlikely.  Troponin was WNL.  Sxs resolved while in the ED.   ? ?Today pt complains of 'dizziness and HA that won't go away'.  Last night pt had to hold onto the wall in order to walk.  'it feels like my head is just going around'.  + nausea.  No hx of vertigo.  No current CP. ? ?'it feels like I have that sinus thing going on'- pt reports 'throbbing' frontal HA.  + nasal congestion.  + pain and swelling of maxillary sinus, L>R.  Had bloody nasal drainage when blowing her nose ? ?L shoulder pain- pt has torn rotator cuff and frozen shoulder.  Rates pain 8-10 depending on the day.  Has seen 3 different orthopedic doctors and is now seeing the pain clinic but pt reports they wouldn't give her medicine b/c 'you don't want to get addicted'.  Pt reports 'it feels like my arm is being twisted out of my socket'.  Pt has no medication for pain and is no longer going to the pain clinic. ? ? ?Review of Systems ?For ROS see HPI  ?   ?Objective:  ? Physical Exam ?Vitals reviewed.  ?Constitutional:   ?   General: She is not in acute distress. ?   Appearance: Normal appearance. She is well-developed. She is obese. She is not ill-appearing.  ?HENT:  ?   Head: Normocephalic and atraumatic.  ?   Right Ear: Tympanic membrane normal.  ?   Left Ear: Tympanic membrane normal.  ?   Nose: Mucosal edema and rhinorrhea present.  ?   Right Sinus: Maxillary sinus tenderness and frontal sinus tenderness present.  ?   Left Sinus: Maxillary sinus tenderness and frontal sinus tenderness present.  ?   Mouth/Throat:  ?   Pharynx: Uvula midline. Posterior oropharyngeal erythema present. No oropharyngeal exudate.  ?Eyes:  ?    Conjunctiva/sclera: Conjunctivae normal.  ?   Pupils: Pupils are equal, round, and reactive to light.  ?Cardiovascular:  ?   Rate and Rhythm: Normal rate and regular rhythm.  ?   Heart sounds: Normal heart sounds.  ?Pulmonary:  ?   Effort: Pulmonary effort is normal. No respiratory distress.  ?   Breath sounds: Normal breath sounds. No wheezing.  ?Musculoskeletal:  ?   Cervical back: Normal range of motion and neck supple.  ?Lymphadenopathy:  ?   Cervical: No cervical adenopathy.  ?Skin: ?   General: Skin is warm and dry.  ?Neurological:  ?   General: No focal deficit present.  ?   Mental Status: She is alert and oriented to person, place, and time.  ?   Cranial Nerves: No cranial nerve deficit.  ?   Motor: No weakness.  ?   Coordination: Coordination normal.  ?Psychiatric:     ?   Mood and Affect: Mood normal.     ?   Behavior: Behavior normal.     ?   Thought Content: Thought content normal.  ? ? ? ? ? ?   ?Assessment & Plan:  ?Bacterial sinusitis- pt has hx of similar and reports this  feels like prior infections.  This is likely contributing to dizziness and nausea.  Start abx- Amoxicillin '875mg'$  BID.  Reviewed supportive care and red flags that should prompt return.  Pt expressed understanding and is in agreement w/ plan.  ? ?Nausea- Zofran prn. ? ?Dizziness- suspect this is due to her sinus infxn and ear pressure.  Tx sinusitis w/ Amoxicillin.  Pt expressed understanding and is in agreement w/ plan.  ? ?Dizziness-  ? ?

## 2021-05-04 ENCOUNTER — Ambulatory Visit: Payer: Medicare Other | Admitting: Family Medicine

## 2021-05-06 NOTE — Assessment & Plan Note (Signed)
Unfortunately pt is not getting any relief and is really struggling w/ pain.  Pain management was not helpful and she doesn't see a reason to return.  Will start Tramadol prn. ?

## 2021-05-28 DIAGNOSIS — D0439 Carcinoma in situ of skin of other parts of face: Secondary | ICD-10-CM | POA: Diagnosis not present

## 2021-05-28 DIAGNOSIS — D040 Carcinoma in situ of skin of lip: Secondary | ICD-10-CM | POA: Diagnosis not present

## 2021-05-29 ENCOUNTER — Encounter: Payer: Self-pay | Admitting: Cardiovascular Disease

## 2021-05-29 ENCOUNTER — Ambulatory Visit (INDEPENDENT_AMBULATORY_CARE_PROVIDER_SITE_OTHER): Payer: Medicare Other | Admitting: Cardiovascular Disease

## 2021-05-29 DIAGNOSIS — E782 Mixed hyperlipidemia: Secondary | ICD-10-CM

## 2021-05-29 DIAGNOSIS — I872 Venous insufficiency (chronic) (peripheral): Secondary | ICD-10-CM | POA: Diagnosis not present

## 2021-05-29 DIAGNOSIS — I10 Essential (primary) hypertension: Secondary | ICD-10-CM

## 2021-05-29 NOTE — Progress Notes (Signed)
05/29/2021 Kristen Manning   Jan 20, 1932  [id number]  Primary Physician Midge Minium, MD Primary Cardiologist: Lorretta Harp MD Garret Reddish, [place] [facility], [place]  HPI:  Kristen Manning is a 88 y.o.  moderate to severely overweight widowed Caucasian female mother of 48, grandmother of her grandchildren referred by Dr. Birdie Riddle for cardiovascular evaluation. I last saw her in the office  08/27/2019. Her brother Kristen Manning is also a patient here. Has a history of treated hypertension and hyperlipidemia. She has had bilateral total knee replacements and has gout She received pneumonia and flu vaccine in the fall of last year. Should she suddenly developed pneumonia and the flow. She has complained of increasing lower extremity edema and dyspnea on exertion with occasional chest pain. I doubled her Lasix to twice a day which resulted in marked improvement in her edema. Since I saw her a year ago she's remained clinically stable. She still has chronic bilateral lower extremity edema on oral diuretics. A venous reflux study apparently was positive for venous reflux.    She was seen in the emergency room on 03/10/2018 with an episode of chest pain.  Her EKG showed no acute changes.  She ruled out for myocardial infarction.  Chest CT did show a mediastinal mass and she was referred to Dr. Darcey Nora for further evaluation.  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 Darcey Nora is following on it ongoing basis but does not think it is large enough to require surgery at this time.   Since I saw her in the office almost 2 years ago she continues to do well.  She really does not get out of the house much except to go food shopping and go to doctors appointments.  She has chronic lower extremity edema probably related to venous insufficiency.  She does not take her diuretics because of increased urinary frequency.  She was recently seen in the ER for sinus congestion and had 1 episode of chest pain but has not had  any since.   Current Meds  Medication Sig   acetaminophen (TYLENOL) 500 MG tablet Take 2 tablets (1,000 mg total) by mouth every 6 (six) hours as needed.   allopurinol (ZYLOPRIM) 300 MG tablet Take 300 mg by mouth as needed.    b complex vitamins tablet Take 1 tablet by mouth daily.   Calcium 600-200 MG-UNIT tablet Take 1 tablet by mouth daily.   Coenzyme Q10 100 MG capsule Take 100 mg by mouth daily.    ezetimibe (ZETIA) 10 MG tablet TAKE ONE TABLET BY MOUTH EVERY MORNING   fenofibrate 160 MG tablet TAKE ONE TABLET BY MOUTH EVERY MORNING   levothyroxine (SYNTHROID) 88 MCG tablet TAKE ONE TABLET BY MOUTH BEFORE BREAKFAST   loratadine (CLARITIN) 10 MG tablet Take 1 tablet (10 mg total) by mouth daily. (Patient taking differently: Take 10 mg by mouth as needed for allergies, itching or rhinitis.)   omeprazole (PRILOSEC) 20 MG capsule TAKE ONE CAPSULE BY MOUTH EVERY MORNING (Patient taking differently: Take 20 mg by mouth as needed.)   ondansetron (ZOFRAN) 4 MG tablet Take 1 tablet (4 mg total) by mouth every 8 (eight) hours as needed for nausea or vomiting.   Polyethylene Glycol 3350 (MIRALAX PO) Take 17 g by mouth daily as needed.   triamcinolone (KENALOG) 0.1 % Apply topically 2 (two) times daily.   triamcinolone (NASACORT) 55 MCG/ACT AERO nasal inhaler Place 2 sprays into the nose daily. (Patient taking differently: Place  2 sprays into the nose as needed.)     Allergies  Allergen Reactions   Aspirin Other (See Comments)    Noted caused 2 holes in retina, not sure    Codeine Hives   Statins     Intolerance to statins   Tizanidine Hcl Other (See Comments)    Confusion    Social History   Socioeconomic History   Marital status: Widowed    Spouse name: Not on file   Number of children: 4   Years of education: Not on file   Highest education level: Not on file  Occupational History   Not on file  Tobacco Use   Smoking status: Never   Smokeless tobacco: Never  Vaping Use    Vaping Use: Never used  Substance and Sexual Activity   Alcohol use: No   Drug use: No   Sexual activity: Not on file  Other Topics Concern   Not on file  Social History Narrative   Lives with brother    Right handed    Social Determinants of Health   Financial Resource Strain: Low Risk    Difficulty of Paying Living Expenses: Not hard at all  Food Insecurity: No Food Insecurity   Worried About Charity fundraiser in the Last Year: Never true   [place] in the Last Year: Never true  Transportation Needs: No Transportation Needs   Lack of Transportation (Medical): No   Lack of Transportation (Non-Medical): No  Physical Activity: Insufficiently Active   Days of Exercise per Week: 2 days   Minutes of Exercise per Session: 20 min  Stress: No Stress Concern Present   Feeling of Stress : Not at all  Social Connections: Moderately Isolated   Frequency of Communication with Friends and Family: More than three times a week   Frequency of Social Gatherings with Friends and Family: Three times a week   Attends Religious Services: More than 4 times per year   Active Member of Clubs or Organizations: No   Attends Archivist Meetings: Never   Marital Status: Widowed  Human resources officer Violence: Not At Risk   Fear of Current or Ex-Partner: No   Emotionally Abused: No   Physically Abused: No   Sexually Abused: No     Review of Systems: General: negative for chills, fever, night sweats or weight changes.  Cardiovascular: negative for chest pain, dyspnea on exertion, edema, orthopnea, palpitations, paroxysmal nocturnal dyspnea or shortness of breath Dermatological: negative for rash Respiratory: negative for cough or wheezing Urologic: negative for hematuria Abdominal: negative for nausea, vomiting, diarrhea, bright red blood per rectum, melena, or hematemesis Neurologic: negative for visual changes, syncope, or dizziness All other systems reviewed and are otherwise  negative except as noted above.    Blood pressure (!) 142/80, pulse 75, height '5\' 5"'$  (1.651 m), weight 199 lb 12.8 oz (90.6 kg), SpO2 98 %.  General appearance: alert and no distress Neck: no adenopathy, no carotid bruit, no JVD, supple, symmetrical, trachea midline, and thyroid not enlarged, symmetric, no tenderness/mass/nodules Lungs: clear to auscultation bilaterally Heart: regular rate and rhythm, S1, S2 normal, no murmur, click, rub or gallop Extremities: 3+ pitting edema bilaterally with venous stasis changes Pulses: 2+ and symmetric Skin: Skin color, texture, turgor normal. No rashes or lesions Neurologic: Grossly normal  EKG sinus rhythm 75 with LVH voltage and poor R wave progression.  Personally reviewed this EKG.  ASSESSMENT AND PLAN:   HTN (hypertension) History of essential  hypertension a blood pressure measured today at 142/80.  She is not on antihypertensive medications.  Hyperlipidemia History of hyperlipidemia intolerant to statin therapy with lipid profile performed 11/09/2019 revealing total cholesterol 167, LDL 95 and HDL 54.  Venous insufficiency, peripheral History of lower extremity edema probably related to venous insufficiency plus or minus diastolic dysfunction.  She was on diuretics which she does not take because of increased urinary frequency.  She has 3+ pitting edema today.     Lorretta Harp MD FACP,FACC,FAHA, [facility] 05/29/2021 9:33 AM

## 2021-05-29 NOTE — Assessment & Plan Note (Signed)
History of essential hypertension a blood pressure measured today at 142/80.  She is not on antihypertensive medications.

## 2021-05-29 NOTE — Patient Instructions (Signed)

## 2021-05-29 NOTE — Assessment & Plan Note (Signed)
History of lower extremity edema probably related to venous insufficiency plus or minus diastolic dysfunction.  She was on diuretics which she does not take because of increased urinary frequency.  She has 3+ pitting edema today.

## 2021-05-29 NOTE — Assessment & Plan Note (Signed)
History of hyperlipidemia intolerant to statin therapy with lipid profile performed 11/09/2019 revealing total cholesterol 167, LDL 95 and HDL 54.

## 2021-08-01 ENCOUNTER — Encounter: Payer: Medicare Other | Admitting: Family Medicine

## 2021-08-03 DIAGNOSIS — H01026 Squamous blepharitis left eye, unspecified eyelid: Secondary | ICD-10-CM | POA: Diagnosis not present

## 2021-08-03 DIAGNOSIS — H01023 Squamous blepharitis right eye, unspecified eyelid: Secondary | ICD-10-CM | POA: Diagnosis not present

## 2021-08-03 DIAGNOSIS — H04121 Dry eye syndrome of right lacrimal gland: Secondary | ICD-10-CM | POA: Diagnosis not present

## 2021-08-03 DIAGNOSIS — Z961 Presence of intraocular lens: Secondary | ICD-10-CM | POA: Diagnosis not present

## 2021-08-03 LAB — HM DIABETES EYE EXAM

## 2021-08-06 ENCOUNTER — Encounter: Payer: Self-pay | Admitting: Family Medicine

## 2021-08-22 ENCOUNTER — Emergency Department (HOSPITAL_BASED_OUTPATIENT_CLINIC_OR_DEPARTMENT_OTHER)
Admission: EM | Admit: 2021-08-22 | Discharge: 2021-08-22 | Disposition: A | Discharge status: Home / Self Care | Payer: Medicare Other | Attending: Emergency Medicine | Admitting: Emergency Medicine

## 2021-08-22 ENCOUNTER — Other Ambulatory Visit: Payer: Self-pay

## 2021-08-22 ENCOUNTER — Emergency Department (HOSPITAL_BASED_OUTPATIENT_CLINIC_OR_DEPARTMENT_OTHER): Payer: Medicare Other

## 2021-08-22 ENCOUNTER — Encounter (HOSPITAL_BASED_OUTPATIENT_CLINIC_OR_DEPARTMENT_OTHER): Payer: Self-pay | Admitting: Emergency Medicine

## 2021-08-22 DIAGNOSIS — Y92009 Unspecified place in unspecified non-institutional (private) residence as the place of occurrence of the external cause: Secondary | ICD-10-CM | POA: Insufficient documentation

## 2021-08-22 DIAGNOSIS — Z471 Aftercare following joint replacement surgery: Secondary | ICD-10-CM | POA: Diagnosis not present

## 2021-08-22 DIAGNOSIS — W19XXXA Unspecified fall, initial encounter: Secondary | ICD-10-CM

## 2021-08-22 DIAGNOSIS — M25562 Pain in left knee: Secondary | ICD-10-CM | POA: Insufficient documentation

## 2021-08-22 DIAGNOSIS — M25561 Pain in right knee: Secondary | ICD-10-CM | POA: Diagnosis not present

## 2021-08-22 DIAGNOSIS — W01198A Fall on same level from slipping, tripping and stumbling with subsequent striking against other object, initial encounter: Secondary | ICD-10-CM | POA: Insufficient documentation

## 2021-08-22 DIAGNOSIS — I672 Cerebral atherosclerosis: Secondary | ICD-10-CM | POA: Diagnosis not present

## 2021-08-22 DIAGNOSIS — M852 Hyperostosis of skull: Secondary | ICD-10-CM | POA: Diagnosis not present

## 2021-08-22 DIAGNOSIS — S0001XA Abrasion of scalp, initial encounter: Secondary | ICD-10-CM | POA: Insufficient documentation

## 2021-08-22 DIAGNOSIS — S80919A Unspecified superficial injury of unspecified knee, initial encounter: Secondary | ICD-10-CM | POA: Diagnosis not present

## 2021-08-22 DIAGNOSIS — Y9389 Activity, other specified: Secondary | ICD-10-CM | POA: Insufficient documentation

## 2021-08-22 DIAGNOSIS — S0990XA Unspecified injury of head, initial encounter: Secondary | ICD-10-CM | POA: Diagnosis not present

## 2021-08-22 DIAGNOSIS — Z96651 Presence of right artificial knee joint: Secondary | ICD-10-CM | POA: Diagnosis not present

## 2021-08-22 DIAGNOSIS — Z743 Need for continuous supervision: Secondary | ICD-10-CM | POA: Diagnosis not present

## 2021-08-22 DIAGNOSIS — R58 Hemorrhage, not elsewhere classified: Secondary | ICD-10-CM | POA: Diagnosis not present

## 2021-08-22 DIAGNOSIS — I10 Essential (primary) hypertension: Secondary | ICD-10-CM | POA: Diagnosis not present

## 2021-08-22 DIAGNOSIS — R6889 Other general symptoms and signs: Secondary | ICD-10-CM | POA: Diagnosis not present

## 2021-08-22 MED ORDER — ACETAMINOPHEN 325 MG PO TABS
650.0000 mg | ORAL_TABLET | Freq: Once | ORAL | Status: AC
  Administered 2021-08-22: 650 mg via ORAL
  Filled 2021-08-22: qty 2

## 2021-08-22 NOTE — ED Provider Notes (Signed)
[facility] EMERGENCY DEPARTMENT Provider Note   CSN: [id number] Arrival date & time: 08/22/21  0935     History  Chief Complaint  Patient presents with   Kristen Manning is a 88 y.o. female.  88 year old female presents for evaluation after a fall.  Patient states that she was wearing crocs, tripped at her stoop and fell forward landing on her knees, hitting her forehead onto the cement step in front of her.  Patient states that when she falls that she is unable to stand back up on her own and has to call the fire department to help get her up.  They were able to help her stand and she was subsequently ambulatory in her home without significant difficulty.  Patient was able to change her clothes but was concerned because she has had bilateral knee replacements and has pain in her knees.  She is not anticoagulated, does not lose consciousness.  She does have a small abrasion to her upper forehead with mild bleeding.  Denies neck or back pain or injury to her upper extremities.  No other injuries, complaints, concerns today.       Home Medications Prior to Admission medications   Medication Sig Start Date End Date Taking? Authorizing Provider  acetaminophen (TYLENOL) 500 MG tablet Take 2 tablets (1,000 mg total) by mouth every 6 (six) hours as needed. 03/20/20   Tedd Sias, PA  allopurinol (ZYLOPRIM) 300 MG tablet Take 300 mg by mouth as needed.  06/27/15   [provider]  b complex vitamins tablet Take 1 tablet by mouth daily.    [provider]  Calcium 600-200 MG-UNIT tablet Take 1 tablet by mouth daily.    [provider]  Coenzyme Q10 100 MG capsule Take 100 mg by mouth daily.     [provider]  ezetimibe (ZETIA) 10 MG tablet TAKE ONE TABLET BY MOUTH EVERY MORNING 03/06/21   Midge Minium, MD  fenofibrate 160 MG tablet TAKE ONE TABLET BY MOUTH EVERY MORNING 03/06/21   Midge Minium, MD  levothyroxine  (SYNTHROID) 88 MCG tablet TAKE ONE TABLET BY MOUTH BEFORE BREAKFAST 03/06/21   Midge Minium, MD  loratadine (CLARITIN) 10 MG tablet Take 1 tablet (10 mg total) by mouth daily. Patient taking differently: Take 10 mg by mouth as needed for allergies, itching or rhinitis. 01/16/17   Midge Minium, MD  omeprazole (PRILOSEC) 20 MG capsule TAKE ONE CAPSULE BY MOUTH EVERY MORNING Patient taking differently: Take 20 mg by mouth as needed. 05/01/21   Midge Minium, MD  ondansetron (ZOFRAN) 4 MG tablet Take 1 tablet (4 mg total) by mouth every 8 (eight) hours as needed for nausea or vomiting. 05/02/21   Midge Minium, MD  Polyethylene Glycol 3350 (MIRALAX PO) Take 17 g by mouth daily as needed.    [provider]  triamcinolone (KENALOG) 0.1 % Apply topically 2 (two) times daily. 02/10/20   [provider]  triamcinolone (NASACORT) 55 MCG/ACT AERO nasal inhaler Place 2 sprays into the nose daily. Patient taking differently: Place 2 sprays into the nose as needed. 11/17/17   Daleen Squibb, MD      Allergies    Aspirin, Codeine, Statins, and Tizanidine hcl    Review of Systems   Review of Systems Negative except as per HPI Physical Exam Updated Vital Signs BP (!) 177/90 (BP Location: Right Arm)   Pulse 91   Temp  98.9 F (37.2 C) (Oral)   Resp 18   Ht '5\' 5"'$  (1.651 m)   Wt 88.7 kg   SpO2 100%   BMI 32.55 kg/m  Physical Exam Vitals and nursing note reviewed.  Constitutional:      General: She is not in acute distress.    Appearance: She is well-developed. She is not diaphoretic.  HENT:     Head: Normocephalic.     Comments: Minor abrasion to the upper forehead at the hairline with mild active bleeding. Eyes:     Extraocular Movements: Extraocular movements intact.     Pupils: Pupils are equal, round, and reactive to light.  Pulmonary:     Effort: Pulmonary effort is normal.  Musculoskeletal:        General: Tenderness present. No swelling or  deformity.     Cervical back: Normal range of motion and neck supple. No tenderness or bony tenderness.     Thoracic back: No tenderness or bony tenderness.     Lumbar back: No tenderness or bony tenderness.     Comments: Mild to moderate tenderness generalized anterior bilateral knees.  No abrasions or contusions noted. No pain with ROM hips or ankles  Skin:    General: Skin is warm and dry.     Findings: No erythema or rash.  Neurological:     Mental Status: She is alert and oriented to person, place, and time.     Sensory: No sensory deficit.     Motor: No weakness.  Psychiatric:        Behavior: Behavior normal.     ED Results / Procedures / Treatments   Labs (all labs ordered are listed, but only abnormal results are displayed) Labs Reviewed - No data to display  EKG None  Radiology CT Head Wo Contrast  Result Date: 08/22/2021 CLINICAL DATA:  [place] while walking up steps, fell forward striking head, no loss of consciousness, minor head trauma. History of hypertension, coronary artery disease post MI EXAM: CT HEAD WITHOUT CONTRAST TECHNIQUE: Contiguous axial images were obtained from the base of the skull through the vertex without intravenous contrast. RADIATION DOSE REDUCTION: This exam was performed according to the departmental dose-optimization program which includes automated exposure control, adjustment of the mA and/or kV according to patient size and/or use of iterative reconstruction technique. COMPARISON:  04/30/2021 FINDINGS: Brain: Minimal age-related atrophy. Normal ventricular morphology. No midline shift or mass effect. Small vessel chronic ischemic changes of deep cerebral white matter. No intracranial hemorrhage, mass lesion, or evidence of acute infarction. No extra-axial fluid collections. Vascular: Atherosclerotic calcifications of internal carotid and vertebral arteries at skull base Skull: Calvaria intact. Hyperostosis frontalis interna. Mild frontal scalp soft  tissue swelling with small foci of air question laceration Sinuses/Orbits: Clear Other: N/A IMPRESSION: Atrophy with small vessel chronic ischemic changes of deep cerebral white matter. No acute intracranial abnormalities. Electronically Signed   By: Lavonia Dana M.D.   On: 08/22/2021 11:24   DG Knee Complete 4 Views Right  Result Date: 08/22/2021 CLINICAL DATA:  88 year old female with knee pain after fall. EXAM: RIGHT KNEE - COMPLETE 4+ VIEW COMPARISON:  None Available. FINDINGS: No evidence of fracture, dislocation, or joint effusion. Status post total knee arthroplasty without complicating features. No evidence of arthropathy or other focal bone abnormality. A prominent fabella is noted. Soft tissues are unremarkable. IMPRESSION: 1. No evidence of fracture or malalignment. 2. Status post right total knee arthroplasty without complicating features. Electronically Signed   By: Camillia Herter  Suttle M.D.   On: 08/22/2021 10:49   DG Knee Complete 4 Views Left  Result Date: 08/22/2021 CLINICAL DATA:  Fall, bilateral knee pain EXAM: LEFT KNEE - COMPLETE 4+ VIEW COMPARISON:  None Available. FINDINGS: Prior left knee replacement. No acute bony abnormality. Specifically, no fracture, subluxation, or dislocation. No joint effusion. Soft tissues are intact IMPRESSION: Left knee replacement without visible complicating feature. No acute bony abnormality. Electronically Signed   By: Rolm Baptise M.D.   On: 08/22/2021 10:48    Procedures Procedures    Medications Ordered in ED Medications  acetaminophen (TYLENOL) tablet 650 mg (650 mg Oral Given 08/22/21 1115)    ED Course/ Medical Decision Making/ A&P                           Medical Decision Making Amount and/or Complexity of Data Reviewed Radiology: ordered.  Risk OTC drugs.   88 year old female presents emergency room after a mechanical fall today, tripped over her shoes and fell landing on her knees with a minor bump to her forehead on her cement  step without loss of consciousness, is not anticoagulated.  She is found to have a minor abrasion at her hairline with mild active bleeding which was controlled with pressure and subsequently resolved.  She does have pain to anterior bilateral knees, is concerned given her bilateral knee replacements.  X-rays of bilateral knees are negative for acute bony injury, agree with radiologist interpretation.  CT of the head obtained due to head injury with complaint of headache and is reassuring, negative for intracranial injury or skull fracture.  Images reviewed, agree with radiologist interpretation.  Patient was given Tylenol for her pain.  Her tetanus is up-to-date.  Recommend following up with her primary care provider for recheck with return to ER precautions given.  Patient is ambulatory in the ER without difficulty.        Final Clinical Impression(s) / ED Diagnoses Final diagnoses:  Fall, initial encounter  Abrasion, scalp w/o infection  Acute pain of both knees    Rx / DC Orders ED Discharge Orders     None         Tacy Learn, PA-C 08/22/21 1159    Elgie Congo, MD 08/22/21 1551

## 2021-08-22 NOTE — ED Notes (Signed)
ED Provider at bedside. 

## 2021-08-22 NOTE — ED Notes (Signed)
Patient transported to X-ray 

## 2021-08-22 NOTE — ED Notes (Signed)
Patient transported to CT 

## 2021-08-22 NOTE — ED Triage Notes (Signed)
Per EMS:  pt tripped and fell at home.  Pt did hit her head, has small Lac to forehead.  No bloodthinners, no LOC.  Pt c/o bilateral knee pain and wants them "checked out"

## 2021-08-22 NOTE — ED Triage Notes (Signed)
Pt states she fell while walking up a step.  Her shoe caught and she fell forward.  Pt admits to hitting her head.  No LOC.  Pt fell onto bilateral knees and has some tenderness there as well.  Noted very small laceration to forehead with hematoma.  Pt denies any neck pain or back pain.

## 2021-08-22 NOTE — Discharge Instructions (Addendum)
Tylenol as needed as directed for pain.  Follow-up for recheck with your primary care provider.  Return to ER for worsening or concerning symptoms. Can apply ice to the knees for 20 minutes at a time.

## 2021-08-30 ENCOUNTER — Ambulatory Visit (INDEPENDENT_AMBULATORY_CARE_PROVIDER_SITE_OTHER): Payer: Medicare Other | Admitting: Family Medicine

## 2021-08-30 ENCOUNTER — Encounter: Payer: Self-pay | Admitting: Family Medicine

## 2021-08-30 VITALS — BP 130/78 | HR 83 | Temp 97.4°F | Resp 17 | Ht 65.0 in | Wt 197.1 lb

## 2021-08-30 DIAGNOSIS — W19XXXA Unspecified fall, initial encounter: Secondary | ICD-10-CM

## 2021-08-30 DIAGNOSIS — Z23 Encounter for immunization: Secondary | ICD-10-CM

## 2021-08-30 DIAGNOSIS — N3281 Overactive bladder: Secondary | ICD-10-CM | POA: Diagnosis not present

## 2021-08-30 MED ORDER — OXYBUTYNIN CHLORIDE ER 5 MG PO TB24: 5.0000 mg | ORAL_TABLET | Freq: Every day | ORAL | 3 refills | Status: DC

## 2021-08-30 NOTE — Patient Instructions (Signed)
Follow up as needed or as scheduled Consider getting a Life Alert monitor in case of another fall Start the Oxybutynin once nightly to try and improve the overactive bladder Call with any questions or concerns Hang in there!!!

## 2021-08-30 NOTE — Progress Notes (Signed)
   Subjective:    Patient ID: Kristen Manning, female    DOB: Jul 30, 1932, 88 y.o.   MRN: [id number]  HPI ER f/u- pt fell on 8/16 when she tripped on her stoop while wearing Crocs.  Landed on her knees, hit her forehead on the cement step in front of her.  She was unable to get up on her own and EMS had to come get her.  Once she got up, she was able to ambulate on her own.  She got nervous about the pain in her knees b/c she has had 2 knee replacements.  Xrays of both knees were negative.  Head CT negative.  Tdap UTD.    Small abrasion at hairline is scabbed over.  Pt reports knee pain is improving.    OAB- pt has hx of this and had a prior 'bladder tack'.  Has been told she is not a surgical candidate.  Interested in medication to help w/ sxs.     Review of Systems For ROS see HPI     Objective:   Physical Exam Vitals reviewed.  Constitutional:      General: She is not in acute distress.    Appearance: Normal appearance. She is obese. She is not ill-appearing.  HENT:     Head: Normocephalic.     Comments: Small scab at hairline Eyes:     Extraocular Movements: Extraocular movements intact.     Conjunctiva/sclera: Conjunctivae normal.     Pupils: Pupils are equal, round, and reactive to light.  Cardiovascular:     Rate and Rhythm: Normal rate and regular rhythm.  Pulmonary:     Effort: Pulmonary effort is normal. No respiratory distress.     Breath sounds: No wheezing.  Musculoskeletal:     Cervical back: Normal range of motion and neck supple.  Skin:    General: Skin is warm and dry.     Findings: Bruising (over knees bilaterally, L>R) present.  Neurological:     General: No focal deficit present.     Mental Status: She is alert and oriented to person, place, and time.  Psychiatric:        Mood and Affect: Mood normal.        Behavior: Behavior normal.        Thought Content: Thought content normal.           Assessment & Plan:  Fall from standing- new.  Pt fell  on her front steps after tripping on her Croc.  Unfortunately, when she falls, she is not able to get herself back up.  This time, she was outside the home and her brother was sleeping.  Not clear as to how long she was outside before he woke and called EMS.  Strongly encouraged her to get a life alert system in case this were to happen again.  That way, she would be able to notify someone immediately.  She states she will get one.  Thankfully injuries were superficial and healing well.

## 2021-08-31 NOTE — Assessment & Plan Note (Signed)
New.  Pt reports she had a 'bladder tack' years ago but this has since failed and she is waking up multiple times nightly to use the bathroom.  Has been told multiple times she is not a surgical candidate at this point.  Would like to try medication to slow her sxs.  Will start low dose Oxybutynin and monitor for side effects and symptomatic improvement.  Pt expressed understanding and is in agreement w/ plan.

## 2021-09-23 ENCOUNTER — Other Ambulatory Visit: Payer: Self-pay | Admitting: Cardiovascular Disease

## 2021-09-24 ENCOUNTER — Telehealth: Payer: Self-pay | Admitting: Family Medicine

## 2021-09-24 ENCOUNTER — Telehealth: Payer: Self-pay | Admitting: Pharmacist

## 2021-09-24 NOTE — Progress Notes (Signed)
error 

## 2021-09-24 NOTE — Telephone Encounter (Signed)
Left message for patient's brother to call back and schedule Medicare Annual Wellness Visit (AWV).   Please offer to do virtually or by telephone.  Left office number and my jabber [phone].  Last AWV:08/28/2020  Please schedule at anytime with Nurse Health Advisor.

## 2021-10-24 DIAGNOSIS — Z79899 Other long term (current) drug therapy: Secondary | ICD-10-CM | POA: Diagnosis not present

## 2021-10-24 DIAGNOSIS — I110 Hypertensive heart disease with heart failure: Secondary | ICD-10-CM | POA: Diagnosis not present

## 2021-10-24 DIAGNOSIS — I509 Heart failure, unspecified: Secondary | ICD-10-CM | POA: Diagnosis not present

## 2021-10-24 DIAGNOSIS — E785 Hyperlipidemia, unspecified: Secondary | ICD-10-CM | POA: Diagnosis not present

## 2021-10-24 DIAGNOSIS — C4491 Basal cell carcinoma of skin, unspecified: Secondary | ICD-10-CM | POA: Diagnosis not present

## 2021-10-24 DIAGNOSIS — E039 Hypothyroidism, unspecified: Secondary | ICD-10-CM | POA: Diagnosis not present

## 2021-10-24 DIAGNOSIS — I5032 Chronic diastolic (congestive) heart failure: Secondary | ICD-10-CM | POA: Diagnosis not present

## 2021-10-24 DIAGNOSIS — Z1159 Encounter for screening for other viral diseases: Secondary | ICD-10-CM | POA: Diagnosis not present

## 2021-10-24 DIAGNOSIS — Z23 Encounter for immunization: Secondary | ICD-10-CM | POA: Diagnosis not present

## 2021-10-24 DIAGNOSIS — E559 Vitamin D deficiency, unspecified: Secondary | ICD-10-CM | POA: Diagnosis not present

## 2021-10-24 DIAGNOSIS — D329 Benign neoplasm of meninges, unspecified: Secondary | ICD-10-CM | POA: Diagnosis not present

## 2021-11-28 ENCOUNTER — Telehealth: Payer: Self-pay | Admitting: Family Medicine

## 2021-11-28 NOTE — Telephone Encounter (Signed)
I called patient to schedule AWV.  Patient said she's going to a new PCP. Her name is Kristen Manning on [address].  Patient said the new PCP is 5 minutes from her house. Please remove PCP.

## 2022-03-05 ENCOUNTER — Telehealth: Payer: Self-pay | Admitting: Cardiovascular Disease

## 2022-03-05 NOTE — Telephone Encounter (Signed)
Spoke with pt who states she had an echo at her PCP office, [facility].  She would like for Dr Gwenlyn Found to review.  Requested pt contact PCP office and request copy be sent to Dr Gwenlyn Found for review.  Echo report not available in pt's chart.  Pt verbalizes understanding and agrees with current plan.

## 2022-03-05 NOTE — Telephone Encounter (Signed)
Patient stated she had an Echo and she would like a call back to discuss the results.

## 2022-05-03 ENCOUNTER — Other Ambulatory Visit: Payer: Self-pay | Admitting: Family Medicine

## 2022-05-14 ENCOUNTER — Telehealth: Payer: Self-pay | Admitting: Family Medicine

## 2022-05-14 NOTE — Telephone Encounter (Signed)
Contacted Kristen Manning to schedule their annual wellness visit. Patient declined to schedule AWV at this time.  I spoke to patient.  Patient has a new PCP, Debria Garret, NP, [facility].  Thank you,  [facility] Support [facility] Direct dial  [phone]

## 2022-06-05 ENCOUNTER — Ambulatory Visit: Payer: 59 | Admitting: Cardiovascular Disease

## 2022-08-06 ENCOUNTER — Ambulatory Visit: Payer: 59 | Admitting: Cardiovascular Disease

## 2022-09-13 ENCOUNTER — Encounter: Payer: Self-pay | Admitting: Cardiovascular Disease

## 2022-09-13 ENCOUNTER — Ambulatory Visit: Payer: 59 | Attending: Cardiovascular Disease | Admitting: Cardiovascular Disease

## 2022-09-13 VITALS — BP 132/74 | HR 59 | Ht 64.0 in | Wt 204.2 lb

## 2022-09-13 DIAGNOSIS — I10 Essential (primary) hypertension: Secondary | ICD-10-CM | POA: Diagnosis not present

## 2022-09-13 DIAGNOSIS — E782 Mixed hyperlipidemia: Secondary | ICD-10-CM

## 2022-09-13 NOTE — Assessment & Plan Note (Signed)
History of lipidemia intolerant to statin therapy on Zetia followed by her PCP.

## 2022-09-13 NOTE — Patient Instructions (Signed)
Medication Instructions:  Your physician recommends that you continue on your current medications as directed. Please refer to the Current Medication list given to you today.  *If you need a refill on your cardiac medications before your next appointment, please call your pharmacy*    Follow-Up: At [facility], you and your health needs are our priority.  As part of our continuing mission to provide you with exceptional heart care, we have created designated Provider Care Teams.  These Care Teams include your primary Cardiologist (physician) and Advanced Practice Providers (APPs -  Physician Assistants and Nurse Practitioners) who all work together to provide you with the care you need, when you need it.  We recommend signing up for the patient portal called "MyChart".  Sign up information is provided on this After Visit Summary.  MyChart is used to connect with patients for Virtual Visits (Telemedicine).  Patients are able to view lab/test results, encounter notes, upcoming appointments, etc.  Non-urgent messages can be sent to your provider as well.   To learn more about what you can do with MyChart, go to [email or web address].    Your next appointment:   1 year(s)  Provider:   Dr Allyson Sabal

## 2022-09-13 NOTE — Assessment & Plan Note (Signed)
History of essential hypertension with blood pressure measured today 132/74.  She is not on antihypertensive medications.

## 2022-09-13 NOTE — Progress Notes (Signed)
09/13/2022 Kristen Manning   1932/07/27  [id number]  Primary Physician Estevan Oaks, NP Primary Cardiologist: Runell Gess MD Milagros Loll, [place], [place]  HPI:  Kristen Manning is a 89 y.o.  moderate to severely overweight widowed Caucasian female mother of 3, grandmother of her grandchildren referred by Dr. Beverely Low for cardiovascular evaluation. I last saw her in the office 05/29/2021. Her brother Kristen Manning is also a patient here. Has a history of treated hypertension and hyperlipidemia. She has had bilateral total knee replacements and has gout She received pneumonia and flu vaccine in the fall of last year. Should she suddenly developed pneumonia and the flow. She has complained of increasing lower extremity edema and dyspnea on exertion with occasional chest pain. I doubled her Lasix to twice a day which resulted in marked improvement in her edema. Since I saw her a year ago she's remained clinically stable. She still has chronic bilateral lower extremity edema on oral diuretics. A venous reflux study apparently was positive for venous reflux.    She was seen in the emergency room on 03/10/2018 with an episode of chest pain.  Her EKG showed no acute changes.  She ruled out for myocardial infarction.  Chest CT did show a mediastinal mass and she was referred to Dr. Maren Beach for further evaluation.  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 Maren Beach is following on it ongoing basis but does not think it is large enough to require surgery at this time.   Since I saw her in the office a year or ago she continues to do well.  She really does not get out of the house much except to go food shopping and go to doctors appointments.  She has chronic lower extremity edema probably related to venous insufficiency.  She does not take her diuretics because of increased urinary frequency.  She was recently seen in the ER for sinus congestion and had 1 episode of chest pain but has not had any  since.  She apparently had a cardiac monitor placed by her PCP the results of which are unavailable to me today.   Current Meds  Medication Sig   acetaminophen (TYLENOL) 500 MG tablet Take 2 tablets (1,000 mg total) by mouth every 6 (six) hours as needed.   allopurinol (ZYLOPRIM) 300 MG tablet Take 300 mg by mouth as needed.    b complex vitamins tablet Take 1 tablet by mouth daily.   Calcium 600-200 MG-UNIT tablet Take 1 tablet by mouth daily.   Coenzyme Q10 100 MG capsule Take 100 mg by mouth daily.    ezetimibe (ZETIA) 10 MG tablet TAKE ONE TABLET BY MOUTH EVERY MORNING   fenofibrate 160 MG tablet TAKE ONE TABLET BY MOUTH EVERY MORNING   levothyroxine (SYNTHROID) 88 MCG tablet TAKE ONE TABLET BY MOUTH BEFORE BREAKFAST   loratadine (CLARITIN) 10 MG tablet Take 1 tablet (10 mg total) by mouth daily. (Patient taking differently: Take 10 mg by mouth as needed for allergies, itching or rhinitis.)   omeprazole (PRILOSEC) 20 MG capsule TAKE ONE CAPSULE BY MOUTH EVERY MORNING   ondansetron (ZOFRAN) 4 MG tablet Take 1 tablet (4 mg total) by mouth every 8 (eight) hours as needed for nausea or vomiting.   Polyethylene Glycol 3350 (MIRALAX PO) Take 17 g by mouth daily as needed.   triamcinolone (NASACORT) 55 MCG/ACT AERO nasal inhaler Place 2 sprays into the nose daily. (Patient taking differently: Place 2 sprays into  the nose as needed.)     Allergies  Allergen Reactions   Aspirin Other (See Comments) and Nausea Only    Noted caused 2 holes in retina, not sure    Codeine Hives and Nausea Only   Statins     Intolerance to statins   Tizanidine Hcl Other (See Comments)    Confusion    Social History   Socioeconomic History   Marital status: Widowed    Spouse name: Not on file   Number of children: 4   Years of education: Not on file   Highest education level: Not on file  Occupational History   Not on file  Tobacco Use   Smoking status: Never   Smokeless tobacco: Never  Vaping Use    Vaping status: Never Used  Substance and Sexual Activity   Alcohol use: No   Drug use: No   Sexual activity: Not on file  Other Topics Concern   Not on file  Social History Narrative   Lives with brother    Right handed    Social Determinants of Health   Financial Resource Strain: Low Risk  (08/28/2020)   Overall Financial Resource Strain (CARDIA)    Difficulty of Paying Living Expenses: Not hard at all  Food Insecurity: No Food Insecurity (08/28/2020)   Hunger Vital Sign    Worried About Running Out of Food in the Last Year: Never true    Ran Out of Food in the Last Year: Never true  Transportation Needs: No Transportation Needs (08/28/2020)   PRAPARE - Administrator, Civil Service (Medical): No    Lack of Transportation (Non-Medical): No  Physical Activity: Insufficiently Active (08/28/2020)   Exercise Vital Sign    Days of Exercise per Week: 2 days    Minutes of Exercise per Session: 20 min  Stress: No Stress Concern Present (08/28/2020)   [employer] of Occupational Health - Occupational Stress Questionnaire    Feeling of Stress : Not at all  Social Connections: Moderately Isolated (08/28/2020)   Social Connection and Isolation Panel [NHANES]    Frequency of Communication with Friends and Family: More than three times a week    Frequency of Social Gatherings with Friends and Family: Three times a week    Attends Religious Services: More than 4 times per year    Active Member of Clubs or Organizations: No    Attends Banker Meetings: Never    Marital Status: Widowed  Intimate Partner Violence: Not At Risk (08/28/2020)   Humiliation, Afraid, Rape, and Kick questionnaire    Fear of Current or Ex-Partner: No    Emotionally Abused: No    Physically Abused: No    Sexually Abused: No     Review of Systems: General: negative for chills, fever, night sweats or weight changes.  Cardiovascular: negative for chest pain, dyspnea on exertion, edema,  orthopnea, palpitations, paroxysmal nocturnal dyspnea or shortness of breath Dermatological: negative for rash Respiratory: negative for cough or wheezing Urologic: negative for hematuria Abdominal: negative for nausea, vomiting, diarrhea, bright red blood per rectum, melena, or hematemesis Neurologic: negative for visual changes, syncope, or dizziness All other systems reviewed and are otherwise negative except as noted above.    Blood pressure 132/74, pulse (!) 59, height 5\' 4"  (1.626 m), weight 204 lb 3.2 oz (92.6 kg), SpO2 96%.  General appearance: alert and no distress Neck: no adenopathy, no carotid bruit, no JVD, supple, symmetrical, trachea midline, and thyroid not enlarged, symmetric,  no tenderness/mass/nodules Lungs: clear to auscultation bilaterally Heart: Regular rate and rhythm without murmurs Extremities: Chronic lower extremity edema Pulses: 2+ and symmetric Skin: Skin color, texture, turgor normal. No rashes or lesions Neurologic: Grossly normal  EKG EKG Interpretation Date/Time:  Friday September 13 2022 11:59:40 EDT Ventricular Rate:  59 PR Interval:  190 QRS Duration:  94 QT Interval:  396 QTC Calculation: 392 R Axis:   -33  Text Interpretation: Sinus bradycardia Left axis deviation Moderate voltage criteria for LVH, may be normal variant ( R in aVL , Cornell product ) Inferior infarct (cited on or before 30-Apr-2021) Anterior infarct (cited on or before 30-Apr-2021) When compared with ECG of 30-Apr-2021 09:16, Questionable change in initial forces of Anterior leads T wave inversion now evident in Inferior leads Confirmed by Nanetta Batty [phone]) on 09/13/2022 12:13:06 PM    ASSESSMENT AND PLAN:   HTN (hypertension) History of essential hypertension with blood pressure measured today 132/74.  She is not on antihypertensive medications.  Hyperlipidemia History of lipidemia intolerant to statin therapy on Zetia followed by her PCP.     Runell Gess MD  FACP,FACC,FAHA, [facility] 09/13/2022 12:24 PM

## 2022-11-13 ENCOUNTER — Ambulatory Visit: Payer: 59 | Admitting: Cardiovascular Disease

## 2023-03-05 ENCOUNTER — Encounter: Payer: Self-pay | Admitting: Family Medicine

## 2023-03-05 ENCOUNTER — Ambulatory Visit (INDEPENDENT_AMBULATORY_CARE_PROVIDER_SITE_OTHER): Payer: 59 | Admitting: Family Medicine

## 2023-03-05 VITALS — BP 142/100 | HR 98 | Temp 98.0°F | Ht 64.0 in | Wt 211.1 lb

## 2023-03-05 DIAGNOSIS — J329 Chronic sinusitis, unspecified: Secondary | ICD-10-CM

## 2023-03-05 DIAGNOSIS — B9689 Other specified bacterial agents as the cause of diseases classified elsewhere: Secondary | ICD-10-CM | POA: Diagnosis not present

## 2023-03-05 DIAGNOSIS — N3941 Urge incontinence: Secondary | ICD-10-CM | POA: Diagnosis not present

## 2023-03-05 MED ORDER — AMOXICILLIN 875 MG PO TABS: 875.0000 mg | ORAL_TABLET | Freq: Two times a day (BID) | ORAL | 0 refills | Status: AC

## 2023-03-05 NOTE — Progress Notes (Signed)
   Subjective:    Patient ID: Kristen Manning, female    DOB: 03-16-1932, 89 y.o.   MRN: [id number]  HPI URI- pt has hx of recurrent sinus infxns.  Current sxs started a few days ago.  + facial pain, HA, congestion.  No fever.  + intermittent cough.  + sore throat.  Urinary incontinence- pt has hx of bladder prolapse followed by bladder 'tack'.  Pt reports urge incontinence.  Pt denies burning w/ urination.  This has been an ongoing issue.  Was previously on Oxybutynin but this was not effective.   Review of Systems For ROS see HPI     Objective:   Physical Exam Vitals reviewed.  Constitutional:      General: She is not in acute distress.    Appearance: She is well-developed. She is obese.  HENT:     Head: Normocephalic and atraumatic.     Right Ear: Tympanic membrane normal.     Left Ear: Tympanic membrane normal.     Nose: Mucosal edema and rhinorrhea present.     Right Sinus: Maxillary sinus tenderness and frontal sinus tenderness present.     Left Sinus: Maxillary sinus tenderness and frontal sinus tenderness present.     Mouth/Throat:     Pharynx: Uvula midline. Posterior oropharyngeal erythema present. No oropharyngeal exudate.  Eyes:     Conjunctiva/sclera: Conjunctivae normal.     Pupils: Pupils are equal, round, and reactive to light.  Cardiovascular:     Rate and Rhythm: Normal rate and regular rhythm.     Heart sounds: Normal heart sounds.  Pulmonary:     Effort: Pulmonary effort is normal. No respiratory distress.     Breath sounds: Normal breath sounds. No wheezing.  Musculoskeletal:     Cervical back: Normal range of motion and neck supple.  Lymphadenopathy:     Cervical: No cervical adenopathy.  Skin:    General: Skin is warm and dry.  Neurological:     General: No focal deficit present.     Mental Status: She is alert and oriented to person, place, and time.     Cranial Nerves: No cranial nerve deficit.     Motor: No weakness.     Coordination:  Coordination normal.  Psychiatric:        Mood and Affect: Mood normal.        Behavior: Behavior normal.        Thought Content: Thought content normal.           Assessment & Plan:  Bacterial sinusitis- pt has hx of similar and PE consistent w/ dx.  Start Amoxicillin 875mg  BID.  Reviewed supportive care and red flags that should prompt return.  Pt expressed understanding and is in agreement w/ plan.

## 2023-03-05 NOTE — Assessment & Plan Note (Addendum)
 Deteriorated.  No longer on Oxybutynin.  Is wearing pads daily.  Is interested in going to urology to discuss options.  Referral placed.

## 2023-03-05 NOTE — Patient Instructions (Signed)
 Follow up in 1 month to recheck blood pressure and cholesterol Start the Amoxicillin twice daily- take w/ food Drink LOTS of fluids REST! We'll call you to schedule your urology appt Call with any questions or concerns Stay Safe!  Stay Healthy! Hang in there!!

## 2023-03-26 ENCOUNTER — Ambulatory Visit (INDEPENDENT_AMBULATORY_CARE_PROVIDER_SITE_OTHER): Admitting: Family Medicine

## 2023-03-26 ENCOUNTER — Encounter: Payer: Self-pay | Admitting: Family Medicine

## 2023-03-26 VITALS — BP 122/72 | HR 69 | Temp 98.3°F | Wt 213.2 lb

## 2023-03-26 DIAGNOSIS — I10 Essential (primary) hypertension: Secondary | ICD-10-CM

## 2023-03-26 DIAGNOSIS — E038 Other specified hypothyroidism: Secondary | ICD-10-CM | POA: Diagnosis not present

## 2023-03-26 DIAGNOSIS — E782 Mixed hyperlipidemia: Secondary | ICD-10-CM | POA: Diagnosis not present

## 2023-03-26 LAB — CBC WITH DIFFERENTIAL/PLATELET
Basophils Absolute: 0.1 10*3/uL (ref 0.0–0.1)
Basophils Relative: 0.6 % (ref 0.0–3.0)
Eosinophils Absolute: 0.1 10*3/uL (ref 0.0–0.7)
Eosinophils Relative: 1.4 % (ref 0.0–5.0)
HCT: 38.7 % (ref 36.0–46.0)
Hemoglobin: 12.8 g/dL (ref 12.0–15.0)
Lymphocytes Relative: 21.9 % (ref 12.0–46.0)
Lymphs Abs: 1.8 10*3/uL (ref 0.7–4.0)
MCHC: 33.1 g/dL (ref 30.0–36.0)
MCV: 93.1 fl (ref 78.0–100.0)
Monocytes Absolute: 0.6 10*3/uL (ref 0.1–1.0)
Monocytes Relative: 7.8 % (ref 3.0–12.0)
Neutro Abs: 5.5 10*3/uL (ref 1.4–7.7)
Neutrophils Relative %: 68.3 % (ref 43.0–77.0)
Platelets: 327 10*3/uL (ref 150.0–400.0)
RBC: 4.15 Mil/uL (ref 3.87–5.11)
RDW: 14.2 % (ref 11.5–15.5)
WBC: 8.1 10*3/uL (ref 4.0–10.5)

## 2023-03-26 LAB — LIPID PANEL
Cholesterol: 151 mg/dL (ref 0–200)
HDL: 66.1 mg/dL (ref >39.00)
LDL Cholesterol: 72 mg/dL (ref 0–99)
NonHDL: 85.2
Total CHOL/HDL Ratio: 2
Triglycerides: 66 mg/dL (ref 0.0–149.0)
VLDL: 13.2 mg/dL (ref 0.0–40.0)

## 2023-03-26 LAB — HEPATIC FUNCTION PANEL
ALT: 16 U/L (ref 0–35)
AST: 26 U/L (ref 0–37)
Albumin: 4.1 g/dL (ref 3.5–5.2)
Alkaline Phosphatase: 44 U/L (ref 39–117)
Bilirubin, Direct: 0.1 mg/dL (ref 0.0–0.3)
Total Bilirubin: 0.5 mg/dL (ref 0.2–1.2)
Total Protein: 7.5 g/dL (ref 6.0–8.3)

## 2023-03-26 LAB — BASIC METABOLIC PANEL
BUN: 23 mg/dL (ref 6–23)
CO2: 26 meq/L (ref 19–32)
Calcium: 9.6 mg/dL (ref 8.4–10.5)
Chloride: 104 meq/L (ref 96–112)
Creatinine, Ser: 1.19 mg/dL (ref 0.40–1.20)
GFR: 40.84 mL/min — ABNORMAL LOW (ref >60.00)
Glucose, Bld: 69 mg/dL — ABNORMAL LOW (ref 70–99)
Potassium: 4.4 meq/L (ref 3.5–5.1)
Sodium: 138 meq/L (ref 135–145)

## 2023-03-26 LAB — TSH: TSH: 4.8 u[IU]/mL (ref 0.35–5.50)

## 2023-03-26 MED ORDER — METOPROLOL SUCCINATE ER 25 MG PO TB24: 25.0000 mg | ORAL_TABLET | Freq: Every day | ORAL | 1 refills | Status: DC

## 2023-03-26 MED ORDER — LOSARTAN POTASSIUM 25 MG PO TABS: 25.0000 mg | ORAL_TABLET | Freq: Every day | ORAL | 1 refills | Status: AC

## 2023-03-26 MED ORDER — LEVOTHYROXINE SODIUM 88 MCG PO TABS: 88.0000 ug | ORAL_TABLET | Freq: Every day | ORAL | 1 refills | Status: DC

## 2023-03-26 MED ORDER — EZETIMIBE 10 MG PO TABS: 10.0000 mg | ORAL_TABLET | Freq: Every morning | ORAL | 2 refills | Status: DC

## 2023-03-26 MED ORDER — FENOFIBRATE 160 MG PO TABS: 160.0000 mg | ORAL_TABLET | Freq: Every morning | ORAL | 2 refills | Status: DC

## 2023-03-26 MED ORDER — OMEPRAZOLE 20 MG PO CPDR: 20.0000 mg | DELAYED_RELEASE_CAPSULE | Freq: Every morning | ORAL | 1 refills | Status: AC

## 2023-03-26 NOTE — Assessment & Plan Note (Signed)
 Chronic problem.  Hx of statin intolerance.  On Zetia 10mg  daily and Fenofibrate 160mg  daily w/o difficulty.  Check labs.  Adjust meds prn

## 2023-03-26 NOTE — Patient Instructions (Signed)
 Schedule your complete physical in 6 months We'll notify you of your lab results and make any changes if needed No med changes at this time- you look great! Call with any questions or concerns Stay Safe!  Stay Healthy! Happy Spring!!!

## 2023-03-26 NOTE — Assessment & Plan Note (Signed)
 Pt's BMI of 36.6 coupled w/ HTN and hyperlipidemia qualifies as morbid obesity.  At 89 yrs old, I don't want her doing strenuous exercise.  Encouraged low carb diet.  Will follow.

## 2023-03-26 NOTE — Progress Notes (Signed)
   Subjective:    Patient ID: Kristen Manning, female    DOB: [birth year], 89 y.o.   MRN: [id number]  HPI HTN- chronic problem.  Is currently on Losartan 25mg  daily and Metoprolol ER  Last visit BP was elevated at 142/100 but she was sick at that time.  No CP, SOB above baseline, HA's, edema.  Hyperlipidemia- chronic problem, on Zetia 10mg  daily and Fenofibrate 160mg  daily.  Denies abd pain, N/V.  Hypothyroid- chronic problem, on Levothyroxine daily.  Denies changes to skin/hair/nails.   Review of Systems For ROS see HPI     Objective:   Physical Exam Vitals reviewed.  Constitutional:      General: She is not in acute distress.    Appearance: Normal appearance. She is well-developed. She is obese. She is not ill-appearing.  HENT:     Head: Normocephalic and atraumatic.  Eyes:     Conjunctiva/sclera: Conjunctivae normal.     Pupils: Pupils are equal, round, and reactive to light.  Neck:     Thyroid: No thyromegaly.  Cardiovascular:     Rate and Rhythm: Normal rate and regular rhythm.     Pulses: Normal pulses.     Heart sounds: Normal heart sounds. No murmur heard. Pulmonary:     Effort: Pulmonary effort is normal. No respiratory distress.     Breath sounds: Normal breath sounds.  Abdominal:     General: There is no distension.     Palpations: Abdomen is soft.     Tenderness: There is no abdominal tenderness.  Musculoskeletal:     Cervical back: Normal range of motion and neck supple.  Lymphadenopathy:     Cervical: No cervical adenopathy.  Skin:    General: Skin is warm and dry.  Neurological:     Mental Status: She is alert and oriented to person, place, and time. Mental status is at baseline.  Psychiatric:        Mood and Affect: Mood normal.        Behavior: Behavior normal.           Assessment & Plan:

## 2023-03-26 NOTE — Assessment & Plan Note (Signed)
Chronic problem.  Currently asymptomatic on Levothyroxine 59mg daily.  Check labs.  Adjust meds prn

## 2023-03-26 NOTE — Assessment & Plan Note (Signed)
 Chronic problem.  Currently on Losartan 25mg  daily and Metoprolol XR 25mg  daily w/ good control.  Currently asymptomatic.  Check labs due to ARB use but no anticipated med changes.

## 2023-03-27 ENCOUNTER — Telehealth: Payer: Self-pay

## 2023-03-27 NOTE — Telephone Encounter (Signed)
 Lab results have been discussed.   Verbalized understanding? Yes  Are there any questions? No

## 2023-03-27 NOTE — Telephone Encounter (Signed)
-----   Message from Neena Rhymes sent at 03/27/2023  7:40 AM EDT ----- Labs are stable and look good.  No changes at this time

## 2023-04-02 ENCOUNTER — Ambulatory Visit: Payer: 59 | Admitting: Family Medicine

## 2023-05-05 ENCOUNTER — Emergency Department (HOSPITAL_COMMUNITY)

## 2023-05-05 ENCOUNTER — Observation Stay (HOSPITAL_COMMUNITY)
Admission: EM | Admit: 2023-05-05 | Discharge: 2023-05-06 | Disposition: A | Discharge status: Home Health | Attending: Family Medicine | Admitting: Family Medicine

## 2023-05-05 ENCOUNTER — Other Ambulatory Visit: Payer: Self-pay

## 2023-05-05 DIAGNOSIS — G8929 Other chronic pain: Secondary | ICD-10-CM | POA: Diagnosis present

## 2023-05-05 DIAGNOSIS — W108XXA Fall (on) (from) other stairs and steps, initial encounter: Secondary | ICD-10-CM | POA: Insufficient documentation

## 2023-05-05 DIAGNOSIS — Z23 Encounter for immunization: Secondary | ICD-10-CM | POA: Insufficient documentation

## 2023-05-05 DIAGNOSIS — S0590XA Unspecified injury of unspecified eye and orbit, initial encounter: Secondary | ICD-10-CM | POA: Diagnosis not present

## 2023-05-05 DIAGNOSIS — Z79899 Other long term (current) drug therapy: Secondary | ICD-10-CM | POA: Insufficient documentation

## 2023-05-05 DIAGNOSIS — K219 Gastro-esophageal reflux disease without esophagitis: Secondary | ICD-10-CM | POA: Diagnosis present

## 2023-05-05 DIAGNOSIS — Z96653 Presence of artificial knee joint, bilateral: Secondary | ICD-10-CM | POA: Insufficient documentation

## 2023-05-05 DIAGNOSIS — F32A Depression, unspecified: Secondary | ICD-10-CM | POA: Diagnosis present

## 2023-05-05 DIAGNOSIS — E039 Hypothyroidism, unspecified: Secondary | ICD-10-CM | POA: Diagnosis present

## 2023-05-05 DIAGNOSIS — S0181XA Laceration without foreign body of other part of head, initial encounter: Secondary | ICD-10-CM | POA: Diagnosis present

## 2023-05-05 DIAGNOSIS — Z85828 Personal history of other malignant neoplasm of skin: Secondary | ICD-10-CM | POA: Insufficient documentation

## 2023-05-05 DIAGNOSIS — W19XXXA Unspecified fall, initial encounter: Principal | ICD-10-CM | POA: Diagnosis present

## 2023-05-05 DIAGNOSIS — S92422A Displaced fracture of distal phalanx of left great toe, initial encounter for closed fracture: Secondary | ICD-10-CM | POA: Diagnosis not present

## 2023-05-05 DIAGNOSIS — E041 Nontoxic single thyroid nodule: Secondary | ICD-10-CM | POA: Insufficient documentation

## 2023-05-05 DIAGNOSIS — N179 Acute kidney failure, unspecified: Secondary | ICD-10-CM | POA: Insufficient documentation

## 2023-05-05 DIAGNOSIS — E785 Hyperlipidemia, unspecified: Secondary | ICD-10-CM | POA: Diagnosis present

## 2023-05-05 DIAGNOSIS — I10 Essential (primary) hypertension: Secondary | ICD-10-CM | POA: Diagnosis present

## 2023-05-05 LAB — CBC WITH DIFFERENTIAL/PLATELET
Abs Immature Granulocytes: 0.05 10*3/uL (ref 0.00–0.07)
Basophils Absolute: 0 10*3/uL (ref 0.0–0.1)
Basophils Relative: 0 %
Eosinophils Absolute: 0.1 10*3/uL (ref 0.0–0.5)
Eosinophils Relative: 1 %
HCT: 38.2 % (ref 36.0–46.0)
Hemoglobin: 12.7 g/dL (ref 12.0–15.0)
Immature Granulocytes: 1 %
Lymphocytes Relative: 23 %
Lymphs Abs: 2.4 10*3/uL (ref 0.7–4.0)
MCH: 30.9 pg (ref 26.0–34.0)
MCHC: 33.2 g/dL (ref 30.0–36.0)
MCV: 92.9 fL (ref 80.0–100.0)
Monocytes Absolute: 0.9 10*3/uL (ref 0.1–1.0)
Monocytes Relative: 8 %
Neutro Abs: 7.1 10*3/uL (ref 1.7–7.7)
Neutrophils Relative %: 67 %
Platelets: 304 10*3/uL (ref 150–400)
RBC: 4.11 MIL/uL (ref 3.87–5.11)
RDW: 13.4 % (ref 11.5–15.5)
WBC: 10.6 10*3/uL — ABNORMAL HIGH (ref 4.0–10.5)
nRBC: 0 % (ref 0.0–0.2)

## 2023-05-05 LAB — COMPREHENSIVE METABOLIC PANEL WITH GFR
ALT: 15 U/L (ref 0–44)
AST: 25 U/L (ref 15–41)
Albumin: 3.6 g/dL (ref 3.5–5.0)
Alkaline Phosphatase: 42 U/L (ref 38–126)
Anion gap: 11 (ref 5–15)
BUN: 22 mg/dL (ref 8–23)
CO2: 21 mmol/L — ABNORMAL LOW (ref 22–32)
Calcium: 9.2 mg/dL (ref 8.9–10.3)
Chloride: 104 mmol/L (ref 98–111)
Creatinine, Ser: 1.51 mg/dL — ABNORMAL HIGH (ref 0.44–1.00)
GFR, Estimated: 33 mL/min — ABNORMAL LOW (ref >60)
Glucose, Bld: 112 mg/dL — ABNORMAL HIGH (ref 70–99)
Potassium: 4.2 mmol/L (ref 3.5–5.1)
Sodium: 136 mmol/L (ref 135–145)
Total Bilirubin: 0.6 mg/dL (ref 0.0–1.2)
Total Protein: 7.1 g/dL (ref 6.5–8.1)

## 2023-05-05 MED ORDER — ONDANSETRON HCL 4 MG/2ML IJ SOLN
4.0000 mg | Freq: Once | INTRAMUSCULAR | Status: AC
  Administered 2023-05-05: 4 mg via INTRAVENOUS
  Filled 2023-05-05: qty 2

## 2023-05-05 MED ORDER — LIDOCAINE-EPINEPHRINE-TETRACAINE (LET) TOPICAL GEL
3.0000 mL | Freq: Once | TOPICAL | Status: AC
  Administered 2023-05-05: 3 mL via TOPICAL
  Filled 2023-05-05: qty 3

## 2023-05-05 MED ORDER — TETRACAINE HCL 0.5 % OP SOLN
1.0000 [drp] | Freq: Once | OPHTHALMIC | Status: AC
  Administered 2023-05-05: 1 [drp] via OPHTHALMIC
  Filled 2023-05-05: qty 4

## 2023-05-05 MED ORDER — FENTANYL CITRATE PF 50 MCG/ML IJ SOSY
50.0000 ug | PREFILLED_SYRINGE | Freq: Once | INTRAMUSCULAR | Status: AC
  Administered 2023-05-05: 50 ug via INTRAVENOUS
  Filled 2023-05-05: qty 1

## 2023-05-05 MED ORDER — IOHEXOL 350 MG/ML SOLN
65.0000 mL | Freq: Once | INTRAVENOUS | Status: AC | PRN
  Administered 2023-05-05: 65 mL via INTRAVENOUS

## 2023-05-05 MED ORDER — LACTATED RINGERS IV BOLUS
1000.0000 mL | Freq: Once | INTRAVENOUS | Status: AC
  Administered 2023-05-05: 1000 mL via INTRAVENOUS

## 2023-05-05 MED ORDER — ACETAMINOPHEN 500 MG PO TABS
1000.0000 mg | ORAL_TABLET | Freq: Once | ORAL | Status: AC
Start: 2023-05-05 — End: 2023-05-05
  Administered 2023-05-05: 1000 mg via ORAL
  Filled 2023-05-05: qty 2

## 2023-05-05 MED ORDER — TETANUS-DIPHTH-ACELL PERTUSSIS 5-2.5-18.5 LF-MCG/0.5 IM SUSY
0.5000 mL | PREFILLED_SYRINGE | Freq: Once | INTRAMUSCULAR | Status: AC
  Administered 2023-05-05: 0.5 mL via INTRAMUSCULAR
  Filled 2023-05-05: qty 0.5

## 2023-05-05 MED ORDER — BACITRACIN ZINC 500 UNIT/GM EX OINT
TOPICAL_OINTMENT | Freq: Two times a day (BID) | CUTANEOUS | Status: DC
  Administered 2023-05-05: 1 via TOPICAL
  Filled 2023-05-05: qty 0.9

## 2023-05-05 MED ORDER — FLUORESCEIN SODIUM 1 MG OP STRP
1.0000 | ORAL_STRIP | Freq: Once | OPHTHALMIC | Status: AC
  Administered 2023-05-05: 1 via OPHTHALMIC
  Filled 2023-05-05: qty 1

## 2023-05-05 NOTE — ED Notes (Signed)
 MD at bedside suturing lac on face

## 2023-05-05 NOTE — ED Notes (Signed)
 Ortho tech paged for splint.

## 2023-05-05 NOTE — ED Notes (Signed)
 Pt's right wrist wrapped. Xeroform placed before hand.

## 2023-05-05 NOTE — ED Triage Notes (Addendum)
 Pt arrived via [facility] from home post mechanical fall forward onto concrete. Brother on scene called. Aprox two inch laceration noted to left cheek with significant swelling of left eye. 2 inch laceration to left side of head. Skin tear to bridge of nose. Skin tear/lacerations noted to bilateral hands. Pt alert and oriented, reporting 5/10 pain.   MD notifed to see patient upon arrival to room. Upgraded to level trauma.

## 2023-05-05 NOTE — ED Notes (Signed)
 Ortho tech at bedside

## 2023-05-05 NOTE — ED Notes (Signed)
 Trauma Response Nurse Documentation   Kristen Manning is a 89 y.o. female arriving to [facility] ED via EMS  On No antithrombotic. Trauma was activated as a Level 2 by Dr. Florie Husband based on the following trauma criteria Discretion of Emergency Department Physician.  Patient cleared for CT by Dr. Florie Husband. Pt transported to CT with primary nurse present to monitor. RN remained with the patient throughout their absence from the department for clinical observation.   GCS 15.   History   Past Medical History:  Diagnosis Date   Arthritis    Bacterial infection    in lungs in Jan 2015   Bilateral lower extremity edema    Cancer (HCC)    skin cancer, back, legs melonama, sees Dr Gara July, derm   Chronic back pain    GERD (gastroesophageal reflux disease)    takes Omeprazole  daily   Headache(784.0)    History of migraine    last one about 15 yrs ago   Hyperlipidemia    takes Zetia  daily   Hypertension    takes HCTZ daily   Joint pain    Joint swelling    Myocardial infarction [facility])    pt states EKG always shows infarct but no change from previous ekg;never knew anything about it   Nocturia    Osteoarthritis    Pneumonia    hx of-many yrs ago   Pneumonia    PONV (postoperative nausea and vomiting)    Shortness of breath    with exertion   Urinary frequency    Urinary incontinence    Urinary urgency      Past Surgical History:  Procedure Laterality Date   ABDOMINAL HYSTERECTOMY     bladder tack  01/08/1996   BLEPHAROPLASTY Bilateral    JOINT REPLACEMENT Right 02/22/2013   Knee   OOPHORECTOMY  01/08/1996   only one   REPLACEMENT TOTAL KNEE Left 10/02/2009   TONSILLECTOMY  as a child ? date   TOTAL KNEE ARTHROPLASTY Right 02/22/2013   Procedure: TOTAL KNEE ARTHROPLASTY;  Surgeon: Christie Cox, MD;  Location: [facility] OR;  Service: Orthopedics;  Laterality: Right;       Initial Focused Assessment (If applicable, or please see trauma documentation): Airway -  clear Breathing - unlabored Circulation - bleeding noted on face from skin tear on left cheek, bridge of nose,  GCS 15  CT's Completed:   CT Head, CT Maxillofacial, and CT C-Spine   Interventions:  LAbs CT scans Port xrays Family presence   Event Summary:  Trauma activation after arrival per EDP discretion-  see EDP note   Asa Bjork  Trauma Response RN  Please call TRN at [phone] for further assistance.

## 2023-05-05 NOTE — ED Provider Notes (Signed)
 [place] EMERGENCY DEPARTMENT AT [facility] Provider Note  CSN: [id number] Arrival date & time: 05/05/23 1713  Chief Complaint(s) Fall and Laceration  HPI Kristen Manning is a 89 y.o. female here today for a fall.  Patient was walking up her steps, her shoe caught and she fell forward striking her face.  She is not on blood thinners.  She did not lose consciousness.  Her brother witnessed the fall and called EMS.   Past Medical History Past Medical History:  Diagnosis Date  . Arthritis   . Bacterial infection    in lungs in Jan 2015  . Bilateral lower extremity edema   . Cancer (HCC)    skin cancer, back, legs melonama, sees Dr Gara July, derm  . Chronic back pain   . GERD (gastroesophageal reflux disease)    takes Omeprazole  daily  . Headache(784.0)   . History of migraine    last one about 15 yrs ago  . Hyperlipidemia    takes Zetia  daily  . Hypertension    takes HCTZ daily  . Joint pain   . Joint swelling   . Myocardial infarction [facility])    pt states EKG always shows infarct but no change from previous ekg;never knew anything about it  . Nocturia   . Osteoarthritis   . Pneumonia    hx of-many yrs ago  . Pneumonia   . PONV (postoperative nausea and vomiting)   . Shortness of breath    with exertion  . Urinary frequency   . Urinary incontinence   . Urinary urgency    Patient Active Problem List   Diagnosis Date Noted  . Urge incontinence of urine 03/05/2023  . OAB (overactive bladder) 08/30/2021  . Chronic left shoulder pain 03/10/2019  . Obesity, morbid (HCC) 02/11/2018  . DDD (degenerative disc disease), lumbar 10/07/2016  . Primary osteoarthritis of both hands 10/07/2016  . Primary osteoarthritis of both feet 10/07/2016  . Tension-type headache, not intractable 09/16/2016  . GERD (gastroesophageal reflux disease) 09/11/2016  . ANA positive 09/02/2016  . History of total knee replacement, bilateral 03/20/2015  . Hypothyroidism 09/21/2014  .  Gout 06/20/2014  . Lower leg edema 06/13/2014  . Meningioma (HCC) 10/07/2013  . Chronic tension-type headache, intractable 10/07/2013  . Depression 06/23/2013  . Insomnia 05/26/2013  . Routine general medical examination at a health care facility 10/17/2011  . HTN (hypertension) 07/03/2011  . Hyperlipidemia 07/03/2011  . Venous insufficiency, peripheral 07/03/2011   Home Medication(s) Prior to Admission medications   Medication Sig Start Date End Date Taking? Authorizing Provider  acetaminophen  (TYLENOL ) 500 MG tablet Take 2 tablets (1,000 mg total) by mouth every 6 (six) hours as needed. 03/20/20   Coretta Dexter, PA  b complex vitamins tablet Take 1 tablet by mouth daily.    [provider]  Calcium  600-200 MG-UNIT tablet Take 1 tablet by mouth daily.    [provider]  Coenzyme Q10 100 MG capsule Take 100 mg by mouth daily.     [provider]  ezetimibe  (ZETIA ) 10 MG tablet Take 1 tablet (10 mg total) by mouth every morning. 03/26/23   Tabori, Katherine E, MD  fenofibrate  160 MG tablet Take 1 tablet (160 mg total) by mouth every morning. 03/26/23   Tabori, Katherine E, MD  levothyroxine  (SYNTHROID ) 88 MCG tablet Take 1 tablet (88 mcg total) by mouth daily before breakfast. 03/26/23   Tabori, Katherine E, MD  loratadine  (CLARITIN ) 10 MG tablet Take 1 tablet (  10 mg total) by mouth daily. Patient taking differently: Take 10 mg by mouth as needed for allergies, itching or rhinitis. 01/16/17   Tabori, Katherine E, MD  losartan  (COZAAR ) 25 MG tablet Take 1 tablet (25 mg total) by mouth daily. 03/26/23   Tabori, Katherine E, MD  metoprolol  succinate (TOPROL -XL) 25 MG 24 hr tablet Take 1 tablet (25 mg total) by mouth daily. 03/26/23   Tabori, Katherine E, MD  omeprazole  (PRILOSEC) 20 MG capsule Take 1 capsule (20 mg total) by mouth every morning. 03/26/23   Tabori, Katherine E, MD  ondansetron  (ZOFRAN ) 4 MG tablet Take 1 tablet (4 mg total) by mouth every 8 (eight) hours  as needed for nausea or vomiting. 05/02/21   Tabori, Katherine E, MD  Polyethylene Glycol 3350 (MIRALAX PO) Take 17 g by mouth daily as needed.    [provider]  triamcinolone  (NASACORT ) 55 MCG/ACT AERO nasal inhaler Place 2 sprays into the nose daily. Patient taking differently: Place 2 sprays into the nose as needed. 11/17/17   Orvel Blanco, MD                                                                                                                                    Past Surgical History Past Surgical History:  Procedure Laterality Date  . ABDOMINAL HYSTERECTOMY    . bladder tack  01/08/1996  . BLEPHAROPLASTY Bilateral   . JOINT REPLACEMENT Right 02/22/2013   Knee  . OOPHORECTOMY  01/08/1996   only one  . REPLACEMENT TOTAL KNEE Left 10/02/2009  . TONSILLECTOMY  as a child ? date  . TOTAL KNEE ARTHROPLASTY Right 02/22/2013   Procedure: TOTAL KNEE ARTHROPLASTY;  Surgeon: Christie Cox, MD;  Location: [facility] OR;  Service: Orthopedics;  Laterality: Right;   Family History Family History  Problem Relation Age of Onset  . Kidney disease Mother   . Heart disease Mother   . Hypertension Mother   . Varicose Veins Mother   . Kidney failure Mother   . Alcohol abuse Father   . Stroke Father        several   . Hypertension Father   . Varicose Veins Father   . Alcohol abuse Brother   . Hyperlipidemia Brother   . Hypertension Brother   . Early death Brother   . Varicose Veins Brother   . Bladder Cancer Brother   . Heart disease Brother   . Hypertension Brother   . Arthritis Brother        Gout  . Varicose Veins Son   . Deep vein thrombosis Son   . Colon cancer Son   . Breast cancer Maternal Aunt   . Pancreatic cancer Neg Hx   . Stomach cancer Neg Hx   . Esophageal cancer Neg Hx   . Liver disease Neg Hx     Social History Social History   Tobacco Use  . Smoking  status: Never  . Smokeless tobacco: Never  Vaping Use  . Vaping status: Never Used   Substance Use Topics  . Alcohol use: No  . Drug use: No   Allergies Aspirin, Codeine, Statins, and Tizanidine hcl  Review of Systems Review of Systems  Physical Exam Vital Signs  I have reviewed the triage vital signs BP 137/85   Pulse 89   Temp 98.2 F (36.8 C)   Resp 19   Ht 5\' 4"  (1.626 m)   Wt 90.7 kg   SpO2 99%   BMI 34.33 kg/m   Physical Exam Vitals reviewed.  HENT:     Head:     Comments: 6.5 cm flap laceration over the left maxilla.  Skin tear over the bridge of the nose.    Mouth/Throat:     Mouth: Mucous membranes are moist.  Eyes:     Comments: Left periorbital swelling obscuring globe.  Patient with reactive pupil in the left eye.  Grossly normal visual acuity.  Patient does have diminished lateral movement of the left eye.  Cardiovascular:     Rate and Rhythm: Normal rate.     Pulses: Normal pulses.  Abdominal:     General: Abdomen is flat. There is no distension.     Palpations: Abdomen is soft.     Tenderness: There is no abdominal tenderness. There is no guarding.  Musculoskeletal:     Comments: Skin tears on the thenar eminence of bilateral hands.  No tenderness to palpation in the bilateral shoulders, upper arms, elbows, forearms or wrists.  No tenderness to palpation in the chest.  Pelvis stable, nontender.  No tenderness, deformities noted on bilateral upper legs, knees, lower legs or ankles.  Patient able to lift both legs from the bed.  No tenderness in the back  Neurological:     General: No focal deficit present.     Mental Status: She is alert and oriented to person, place, and time.     Cranial Nerves: No cranial nerve deficit.     Motor: No weakness.    ED Results and Treatments Labs (all labs ordered are listed, but only abnormal results are displayed) Labs Reviewed  COMPREHENSIVE METABOLIC PANEL WITH GFR - Abnormal; Notable for the following components:      Result Value   CO2 21 (*)    Glucose, Bld 112 (*)    Creatinine, Ser  1.51 (*)    GFR, Estimated 33 (*)    All other components within normal limits  CBC WITH DIFFERENTIAL/PLATELET - Abnormal; Notable for the following components:   WBC 10.6 (*)    All other components within normal limits                                                                                                                          Radiology CT CHEST ABDOMEN PELVIS W CONTRAST Result Date: 05/05/2023 CLINICAL DATA:  Blunt trauma, fell EXAM: CT CHEST, ABDOMEN, AND PELVIS WITH  CONTRAST TECHNIQUE: Multidetector CT imaging of the chest, abdomen and pelvis was performed following the standard protocol during bolus administration of intravenous contrast. RADIATION DOSE REDUCTION: This exam was performed according to the departmental dose-optimization program which includes automated exposure control, adjustment of the mA and/or kV according to patient size and/or use of iterative reconstruction technique. CONTRAST:  65mL OMNIPAQUE  IOHEXOL  350 MG/ML SOLN COMPARISON:  08/10/2020, 10/07/2018 FINDINGS: CT CHEST FINDINGS Cardiovascular: The heart is unremarkable without pericardial effusion. No evidence of thoracic aortic aneurysm or dissection. Atherosclerosis of the aorta and coronary vasculature. Mediastinum/Nodes: Complex cystic and solid mass within the anterior mediastinum is again identified, measuring 6.1 x 5.3 cm. Since prior imaging studies, the soft tissue component has enlarged, measuring up to 5.1 x 2.4 cm. Coarse calcification has developed along the superior margin of the mass. This may reflect enlarging teratoma. No pathologic adenopathy. Stable 2 cm hypodensity right lobe thyroid . Trachea and esophagus are unremarkable. Lungs/Pleura: No acute airspace disease, effusion, or pneumothorax. Mild bilateral lower lobe bronchiectasis. Musculoskeletal: No acute or destructive bony abnormalities. Reconstructed images demonstrate no additional findings. CT ABDOMEN PELVIS FINDINGS Hepatobiliary: Multiple  calcified gallstones without evidence of acute cholecystitis. The liver is unremarkable. No biliary duct dilation. Pancreas: Unremarkable. No pancreatic ductal dilatation or surrounding inflammatory changes. Spleen: Normal in size without focal abnormality. Adrenals/Urinary Tract: Stable 1.9 cm left adrenal nodule, likely adenoma. Right adrenal is unremarkable. Kidneys enhance normally. No urinary tract calculi or obstruction. Bladder is moderately distended without filling defect. Stomach/Bowel: No bowel obstruction or ileus. The appendix, if still present, is not well visualized. Diverticulosis of the descending and sigmoid colon without evidence of diverticulitis. No bowel wall thickening or inflammatory change. Vascular/Lymphatic: Aortic atherosclerosis. No enlarged abdominal or pelvic lymph nodes. Reproductive: Status post hysterectomy. No adnexal masses. Other: No free fluid or free intraperitoneal gas. No abdominal wall hernia. Musculoskeletal: No acute or destructive bony abnormalities. Reconstructed images demonstrate no additional findings. IMPRESSION: 1. No acute intrathoracic, intra-abdominal, or intrapelvic trauma. 2. Enlarging complex cystic and solid mass within the anterior mediastinum, with increasing soft tissue component and new coarse calcifications. 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 3. Cholelithiasis without evidence of acute cholecystitis. 4. Distal colonic diverticulosis without diverticulitis. 5.  Aortic Atherosclerosis (ICD10-I70.0). Electronically Signed   By: Bobbye Burrow M.D.   On: 05/05/2023 21:57   CT Head Wo Contrast Result Date: 05/05/2023 CLINICAL DATA:  Blunt facial trauma. Cervical trauma and ataxia. Head trauma. EXAM: CT HEAD WITHOUT CONTRAST CT MAXILLOFACIAL WITHOUT CONTRAST CT CERVICAL SPINE WITHOUT CONTRAST TECHNIQUE: Multidetector CT imaging of the head, cervical spine, and maxillofacial structures were performed using the standard protocol  without intravenous contrast. Multiplanar CT image reconstructions of the cervical spine and maxillofacial structures were also generated. RADIATION DOSE REDUCTION: This exam was performed according to the departmental dose-optimization program which includes automated exposure control, adjustment of the mA and/or kV according to patient size and/or use of iterative reconstruction technique. COMPARISON:  CT head 08/22/2021 FINDINGS: CT HEAD FINDINGS Brain: No evidence of acute infarction, hemorrhage, hydrocephalus, extra-axial collection or mass lesion/mass effect. Vascular: No hyperdense vessel or unexpected calcification. Skull: No acute fracture. Left posterior scalp laceration/contusion. Other: None. CT MAXILLOFACIAL FINDINGS Osseous: No fracture or mandibular dislocation. No destructive process. Orbits: Globes are intact.  Left periorbital hematoma. Sinuses: Sequela of chronic right maxillary sinusitis. No acute abnormality. Mastoid air cells are well aerated. Soft tissues: Left periorbital hematoma. Left face contusion and laceration. CT CERVICAL SPINE FINDINGS Alignment: No evidence of  traumatic listhesis. Skull base and vertebrae: No acute fracture. Soft tissues and spinal canal: No prevertebral fluid or swelling. No visible canal hematoma. Disc levels: Multilevel spondylosis and disc space height loss. No severe spinal canal narrowing. Upper chest: No acute abnormality. Other: 2.1 cm right thyroid  nodule. IMPRESSION: 1. No acute intracranial abnormality. 2. Left posterior scalp laceration/contusion. No calvarial fracture. 3. No acute facial fracture. Left periorbital hematoma. 4. Left face contusion/laceration. 5. No acute fracture or traumatic listhesis in the cervical spine. 6. 2.1 cm right thyroid  nodule. Follow-up with nonemergent thyroid  ultrasound if clinically appropriate given age and comorbidities. (ref: J Am Coll Radiol. 2015 Feb;12(2): 143-50). Electronically Signed   By: Rozell Cornet M.D.    On: 05/05/2023 18:57   CT Maxillofacial Wo Contrast Result Date: 05/05/2023 CLINICAL DATA:  Blunt facial trauma. Cervical trauma and ataxia. Head trauma. EXAM: CT HEAD WITHOUT CONTRAST CT MAXILLOFACIAL WITHOUT CONTRAST CT CERVICAL SPINE WITHOUT CONTRAST TECHNIQUE: Multidetector CT imaging of the head, cervical spine, and maxillofacial structures were performed using the standard protocol without intravenous contrast. Multiplanar CT image reconstructions of the cervical spine and maxillofacial structures were also generated. RADIATION DOSE REDUCTION: This exam was performed according to the departmental dose-optimization program which includes automated exposure control, adjustment of the mA and/or kV according to patient size and/or use of iterative reconstruction technique. COMPARISON:  CT head 08/22/2021 FINDINGS: CT HEAD FINDINGS Brain: No evidence of acute infarction, hemorrhage, hydrocephalus, extra-axial collection or mass lesion/mass effect. Vascular: No hyperdense vessel or unexpected calcification. Skull: No acute fracture. Left posterior scalp laceration/contusion. Other: None. CT MAXILLOFACIAL FINDINGS Osseous: No fracture or mandibular dislocation. No destructive process. Orbits: Globes are intact.  Left periorbital hematoma. Sinuses: Sequela of chronic right maxillary sinusitis. No acute abnormality. Mastoid air cells are well aerated. Soft tissues: Left periorbital hematoma. Left face contusion and laceration. CT CERVICAL SPINE FINDINGS Alignment: No evidence of traumatic listhesis. Skull base and vertebrae: No acute fracture. Soft tissues and spinal canal: No prevertebral fluid or swelling. No visible canal hematoma. Disc levels: Multilevel spondylosis and disc space height loss. No severe spinal canal narrowing. Upper chest: No acute abnormality. Other: 2.1 cm right thyroid  nodule. IMPRESSION: 1. No acute intracranial abnormality. 2. Left posterior scalp laceration/contusion. No calvarial  fracture. 3. No acute facial fracture. Left periorbital hematoma. 4. Left face contusion/laceration. 5. No acute fracture or traumatic listhesis in the cervical spine. 6. 2.1 cm right thyroid  nodule. Follow-up with nonemergent thyroid  ultrasound if clinically appropriate given age and comorbidities. (ref: J Am Coll Radiol. 2015 Feb;12(2): 143-50). Electronically Signed   By: Rozell Cornet M.D.   On: 05/05/2023 18:57   CT Cervical Spine Wo Contrast Result Date: 05/05/2023 CLINICAL DATA:  Blunt facial trauma. Cervical trauma and ataxia. Head trauma. EXAM: CT HEAD WITHOUT CONTRAST CT MAXILLOFACIAL WITHOUT CONTRAST CT CERVICAL SPINE WITHOUT CONTRAST TECHNIQUE: Multidetector CT imaging of the head, cervical spine, and maxillofacial structures were performed using the standard protocol without intravenous contrast. Multiplanar CT image reconstructions of the cervical spine and maxillofacial structures were also generated. RADIATION DOSE REDUCTION: This exam was performed according to the departmental dose-optimization program which includes automated exposure control, adjustment of the mA and/or kV according to patient size and/or use of iterative reconstruction technique. COMPARISON:  CT head 08/22/2021 FINDINGS: CT HEAD FINDINGS Brain: No evidence of acute infarction, hemorrhage, hydrocephalus, extra-axial collection or mass lesion/mass effect. Vascular: No hyperdense vessel or unexpected calcification. Skull: No acute fracture. Left posterior scalp laceration/contusion. Other: None. CT MAXILLOFACIAL FINDINGS Osseous: No  fracture or mandibular dislocation. No destructive process. Orbits: Globes are intact.  Left periorbital hematoma. Sinuses: Sequela of chronic right maxillary sinusitis. No acute abnormality. Mastoid air cells are well aerated. Soft tissues: Left periorbital hematoma. Left face contusion and laceration. CT CERVICAL SPINE FINDINGS Alignment: No evidence of traumatic listhesis. Skull base and  vertebrae: No acute fracture. Soft tissues and spinal canal: No prevertebral fluid or swelling. No visible canal hematoma. Disc levels: Multilevel spondylosis and disc space height loss. No severe spinal canal narrowing. Upper chest: No acute abnormality. Other: 2.1 cm right thyroid  nodule. IMPRESSION: 1. No acute intracranial abnormality. 2. Left posterior scalp laceration/contusion. No calvarial fracture. 3. No acute facial fracture. Left periorbital hematoma. 4. Left face contusion/laceration. 5. No acute fracture or traumatic listhesis in the cervical spine. 6. 2.1 cm right thyroid  nodule. Follow-up with nonemergent thyroid  ultrasound if clinically appropriate given age and comorbidities. (ref: J Am Coll Radiol. 2015 Feb;12(2): 143-50). Electronically Signed   By: Rozell Cornet M.D.   On: 05/05/2023 18:57   DG Hand Complete Right Result Date: 05/05/2023 CLINICAL DATA:  Trauma, fell, pain EXAM: RIGHT HAND - COMPLETE 3+ VIEW COMPARISON:  09/04/2016 FINDINGS: Frontal, oblique, lateral views of the right hand are obtained, with evaluation of the fourth digit limited by pulse oximeter. There is a small intra-articular fracture at the ulnar aspect base of the first distal phalanx, with overlying soft tissue swelling of the first digit. No other acute bony abnormalities. Diffuse osteoarthritis, most severe at the first carpometacarpal joint. IMPRESSION: 1. Small intra-articular fracture at the base of the first distal phalanx, with overlying soft tissue swelling. 2. Osteoarthritis, greatest at the base of thumb. Electronically Signed   By: Bobbye Burrow M.D.   On: 05/05/2023 18:10   DG Hand Complete Left Result Date: 05/05/2023 CLINICAL DATA:  Marvell Slider, hand pain EXAM: LEFT HAND - COMPLETE 3+ VIEW COMPARISON:  09/04/2016 FINDINGS: Frontal, oblique, and lateral views of the left hand are obtained. There are no acute displaced fractures. Severe osteoarthritis of the base of the thumb. Mild diffuse interphalangeal  joint space narrowing. Dorsal soft tissue swelling of the wrist, hand, and along the fifth digit. IMPRESSION: 1. No acute displaced fracture. 2. Dorsal soft tissue swelling. 3. Osteoarthritis, greatest at the base of the thumb. Electronically Signed   By: Bobbye Burrow M.D.   On: 05/05/2023 18:08   DG Chest Portable 1 View Result Date: 05/05/2023 CLINICAL DATA:  Marvell Slider, anticoagulated EXAM: PORTABLE CHEST 1 VIEW COMPARISON:  04/30/2021, 10/07/2018 FINDINGS: Single frontal view of the chest was obtained, with the patient rotated toward the right. Enlarged cardiac silhouette is likely due to AP portable technique and positioning. Rounded density along the right anterior mediastinal margin compatible with known cystic anterior mediastinal mass. Lung volumes are diminished with crowding the pulmonary vasculature. No airspace disease, effusion, or pneumothorax. No displaced fracture. IMPRESSION: 1. Low lung volumes. 2. No acute intrathoracic process. 3. Rounded mass along the right anterior mediastinal margin consistent with known cystic mediastinal mass. Increased prominence may be due to positioning and technique. Electronically Signed   By: Bobbye Burrow M.D.   On: 05/05/2023 18:07    Pertinent labs & imaging results that were available during my care of the patient were reviewed by me and considered in my medical decision making (see MDM for details).  Medications Ordered in ED Medications  acetaminophen  (TYLENOL ) tablet 1,000 mg (has no administration in time range)  Tdap (BOOSTRIX) injection 0.5 mL (0.5 mLs Intramuscular Given 05/05/23 1744)  fentaNYL  (SUBLIMAZE ) injection 50 mcg (50 mcg Intravenous Given 05/05/23 1744)  ondansetron  (ZOFRAN ) injection 4 mg (4 mg Intravenous Given 05/05/23 1744)  lidocaine -EPINEPHrine -tetracaine (LET) topical gel (3 mLs Topical Given 05/05/23 1906)  lactated ringers  bolus 1,000 mL (0 mLs Intravenous Stopped 05/05/23 2100)  ondansetron  (ZOFRAN ) injection 4 mg (4 mg  Intravenous Given 05/05/23 1942)  lidocaine -EPINEPHrine -tetracaine (LET) topical gel (3 mLs Topical Given 05/05/23 1945)  lactated ringers  bolus 1,000 mL (1,000 mLs Intravenous New Bag/Given 05/05/23 2103)  fluorescein ophthalmic strip 1 strip (1 strip Left Eye Given by Other 05/05/23 2104)  tetracaine (PONTOCAINE) 0.5 % ophthalmic solution 1 drop (1 drop Left Eye Given by Other 05/05/23 2104)  ondansetron  (ZOFRAN ) injection 4 mg (4 mg Intravenous Given 05/05/23 2106)  iohexol  (OMNIPAQUE ) 350 MG/ML injection 65 mL (65 mLs Intravenous Contrast Given 05/05/23 2142)                                                                                                                                     Procedures .Critical Care  Performed by: Nathanael Baker, DO Authorized by: Nathanael Baker, DO   Critical care provider statement:    Critical care time (minutes):  88   Critical care was necessary to treat or prevent imminent or life-threatening deterioration of the following conditions:  Trauma   Critical care was time spent personally by me on the following activities:  Development of treatment plan with patient or surrogate, discussions with consultants, evaluation of patient's response to treatment, examination of patient, ordering and review of laboratory studies, ordering and review of radiographic studies, ordering and performing treatments and interventions, pulse oximetry, re-evaluation of patient's condition and review of old charts .Laceration Repair  Date/Time: 05/05/2023 9:56 PM  Performed by: Nathanael Baker, DO Authorized by: Nathanael Baker, DO   Laceration details:    Length (cm):  6.5 Repair type:    Repair type:  Intermediate Comments:     On the left maxilla there is a 6.5 cm flap laceration.  Area numbed using let gel.  Irrigated with 20 cc sterile saline.  Placed 7 simple interrupted sutures using 5-0 Prolene.  The medial aspect there is an abrasion with macerated tissue that  was unable to be closed.  Similarly, there was a superficial area of a skin tear to the lateral portion of this wound.  Those areas will heal by secondary intention.  Patient tolerated procedure well. .Laceration Repair  Date/Time: 05/05/2023 10:24 PM  Performed by: Nathanael Baker, DO Authorized by: Afton Horse T, DO   Laceration details:    Length (cm):  0.5 Comments:     On the bridge of the nose there was a 0.5 cm laceration.  Area numbed using let gel.  Irrigated with 20 cc of sterile saline.  Using 5-0 Prolene, placed a single simple interrupted suture.  Surrounding this laceration was area of macerated tissue which will  have to heal by secondary intention.  Patient tolerated procedure well.   (including critical care time)  Medical Decision Making / ED Course   This patient presents to the ED for concern of a fall, this involves an extensive number of treatment options, and is a complaint that carries with it a high risk of complications and morbidity.  The differential diagnosis includes globe injury, orbital fracture, intracranial hemorrhage, cervical spine injury, facial laceration, optic nerve entrapment.  MDM: Activated patient as a level 2 trauma upon my assessment.  Do of concern for globe injury in this patient.  Will obtain CT imaging of the max face to better characterize.  If negative, will test pressure.  Patient does have some diminished movement of the left eye, do of concern for entrapment.  Will reassess when patient returns from imaging.  Will plan to repair laceration of the patient's face.  Reassessment 955-regarding patient's eye, once CT imaging returned and showed negative for orbital fracture, and an intact globe, was able to open the patient's eye a bit more.  She has normal vision in that eye.  She has a negative Seidel sign.  I was able to get the patient to move her eye left, right, up and down in a full range.  Patient then endorsed some pain on the  left side of her chest wall and abdomen.  Sent patient back over for CTA of the chest abdomen and pelvis.  Patient with a small fracture in the distal phalanx of the right thumb.  Placed in a finger splint.  CT negative for acute intra-abdominal process.  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  This has been seen previously.  I am going to admit patient to medicine for her fall which I believe is likely in part due to dehydration.  Believe patient would benefit from IV fluids, pain management this evening and PT evaluation in the morning.   Additional history obtained: -Additional history obtained from family at bedside -External records from outside source obtained and reviewed including: Chart review including previous notes, labs, imaging, consultation notes   Lab Tests: -I ordered, reviewed, and interpreted labs.   The pertinent results include:   Labs Reviewed  COMPREHENSIVE METABOLIC PANEL WITH GFR - Abnormal; Notable for the following components:      Result Value   CO2 21 (*)    Glucose, Bld 112 (*)    Creatinine, Ser 1.51 (*)    GFR, Estimated 33 (*)    All other components within normal limits  CBC WITH DIFFERENTIAL/PLATELET - Abnormal; Notable for the following components:   WBC 10.6 (*)    All other components within normal limits      EKG my independent review of the patient's EKG shows no ST segment depressions or elevations, no T wave inversions, no evidence of acute ischemia.  EKG Interpretation Date/Time:  Monday May 05 2023 17:53:18 EDT Ventricular Rate:  82 PR Interval:  194 QRS Duration:  112 QT Interval:  380 QTC Calculation: 444 R Axis:   -12  Text Interpretation: Sinus rhythm Low voltage, precordial leads Probable left ventricular hypertrophy Confirmed by Afton Horse [phone]) on 05/05/2023 9:53:34 PM         Imaging Studies ordered: I ordered imaging studies including CT head, max face, cervical spine, chest  abdomen pelvis I independently visualized and interpreted imaging. I agree with the radiologist interpretation   Medicines ordered and prescription drug management: Meds ordered this encounter  Medications  . Tdap (BOOSTRIX) injection 0.5 mL  . fentaNYL  (SUBLIMAZE ) injection 50 mcg  . ondansetron  (ZOFRAN ) injection 4 mg  . lidocaine -EPINEPHrine -tetracaine (LET) topical gel  . lactated ringers  bolus 1,000 mL  . ondansetron  (ZOFRAN ) injection 4 mg  . lidocaine -EPINEPHrine -tetracaine (LET) topical gel  . lactated ringers  bolus 1,000 mL  . fluorescein ophthalmic strip 1 strip  . tetracaine (PONTOCAINE) 0.5 % ophthalmic solution 1 drop  . ondansetron  (ZOFRAN ) injection 4 mg  . iohexol  (OMNIPAQUE ) 350 MG/ML injection 65 mL  . acetaminophen  (TYLENOL ) tablet 1,000 mg    -I have reviewed the patients home medicines and have made adjustments as needed  Critical interventions Managed with trauma   Cardiac Monitoring: The patient was maintained on a cardiac monitor.  I personally viewed and interpreted the cardiac monitored which showed an underlying rhythm of: Normal sinus rhythm  Social Determinants of Health:  Factors impacting patients care include: Lack of access to primary care   Reevaluation: After the interventions noted abimprovedove, I reevaluated the patient and found that they have :  Co morbidities that complicate the patient evaluation . Past Medical History:  Diagnosis Date  . Arthritis   . Bacterial infection    in lungs in Jan 2015  . Bilateral lower extremity edema   . Cancer (HCC)    skin cancer, back, legs melonama, sees Dr Gara July, derm  . Chronic back pain   . GERD (gastroesophageal reflux disease)    takes Omeprazole  daily  . Headache(784.0)   . History of migraine    last one about 15 yrs ago  . Hyperlipidemia    takes Zetia  daily  . Hypertension    takes HCTZ daily  . Joint pain   . Joint swelling   . Myocardial infarction [facility])    pt states  EKG always shows infarct but no change from previous ekg;never knew anything about it  . Nocturia   . Osteoarthritis   . Pneumonia    hx of-many yrs ago  . Pneumonia   . PONV (postoperative nausea and vomiting)   . Shortness of breath    with exertion  . Urinary frequency   . Urinary incontinence   . Urinary urgency       Dispostion: Admit to hospitalist service.     Final Clinical Impression(s) / ED Diagnoses Final diagnoses:  Fall, initial encounter  Eye trauma  Laceration of multiple sites of face     @PCDICTATION @    Afton Horse T, DO 05/05/23 2225

## 2023-05-05 NOTE — ED Notes (Signed)
 Patient transported to CT with RN on the monitor

## 2023-05-05 NOTE — ED Notes (Signed)
Pt resting, no distress noted. Family at bedside.

## 2023-05-05 NOTE — ED Notes (Signed)
 Ortho tech to go upstairs and place the splint. Pt to be transported upstairs.  Pt resting, denies needs.

## 2023-05-06 ENCOUNTER — Other Ambulatory Visit (HOSPITAL_COMMUNITY): Payer: Self-pay

## 2023-05-06 DIAGNOSIS — S0181XA Laceration without foreign body of other part of head, initial encounter: Secondary | ICD-10-CM | POA: Diagnosis not present

## 2023-05-06 DIAGNOSIS — W19XXXA Unspecified fall, initial encounter: Secondary | ICD-10-CM | POA: Diagnosis not present

## 2023-05-06 DIAGNOSIS — I10 Essential (primary) hypertension: Secondary | ICD-10-CM | POA: Diagnosis not present

## 2023-05-06 LAB — CBC
HCT: 31.8 % — ABNORMAL LOW (ref 36.0–46.0)
Hemoglobin: 10.6 g/dL — ABNORMAL LOW (ref 12.0–15.0)
MCH: 30.5 pg (ref 26.0–34.0)
MCHC: 33.3 g/dL (ref 30.0–36.0)
MCV: 91.6 fL (ref 80.0–100.0)
Platelets: 258 10*3/uL (ref 150–400)
RBC: 3.47 MIL/uL — ABNORMAL LOW (ref 3.87–5.11)
RDW: 13.5 % (ref 11.5–15.5)
WBC: 9.9 10*3/uL (ref 4.0–10.5)
nRBC: 0 % (ref 0.0–0.2)

## 2023-05-06 LAB — BASIC METABOLIC PANEL WITH GFR
Anion gap: 6 (ref 5–15)
BUN: 19 mg/dL (ref 8–23)
CO2: 24 mmol/L (ref 22–32)
Calcium: 8.6 mg/dL — ABNORMAL LOW (ref 8.9–10.3)
Chloride: 105 mmol/L (ref 98–111)
Creatinine, Ser: 1.33 mg/dL — ABNORMAL HIGH (ref 0.44–1.00)
GFR, Estimated: 38 mL/min — ABNORMAL LOW (ref >60)
Glucose, Bld: 92 mg/dL (ref 70–99)
Potassium: 4.8 mmol/L (ref 3.5–5.1)
Sodium: 135 mmol/L (ref 135–145)

## 2023-05-06 MED ORDER — FENOFIBRATE 160 MG PO TABS
160.0000 mg | ORAL_TABLET | Freq: Every morning | ORAL | Status: DC
Start: 2023-05-06 — End: 2023-05-06
  Filled 2023-05-06: qty 1

## 2023-05-06 MED ORDER — ACETAMINOPHEN 650 MG RE SUPP: 650.0000 mg | Freq: Four times a day (QID) | RECTAL | Status: DC

## 2023-05-06 MED ORDER — OXYCODONE HCL 5 MG PO TABS
5.0000 mg | ORAL_TABLET | Freq: Four times a day (QID) | ORAL | Status: DC | PRN
  Administered 2023-05-06 (×2): 5 mg via ORAL
  Filled 2023-05-06 (×2): qty 1

## 2023-05-06 MED ORDER — METOPROLOL SUCCINATE ER 25 MG PO TB24
25.0000 mg | ORAL_TABLET | Freq: Every day | ORAL | Status: DC
  Administered 2023-05-06: 25 mg via ORAL
  Filled 2023-05-06: qty 1

## 2023-05-06 MED ORDER — PANTOPRAZOLE SODIUM 40 MG PO TBEC
40.0000 mg | DELAYED_RELEASE_TABLET | Freq: Every day | ORAL | Status: DC
  Administered 2023-05-06: 40 mg via ORAL
  Filled 2023-05-06: qty 1

## 2023-05-06 MED ORDER — CEPHALEXIN 500 MG PO CAPS
500.0000 mg | ORAL_CAPSULE | Freq: Three times a day (TID) | ORAL | Status: DC
  Administered 2023-05-06 (×2): 500 mg via ORAL
  Filled 2023-05-06 (×2): qty 1

## 2023-05-06 MED ORDER — CEPHALEXIN 500 MG PO CAPS
500.0000 mg | ORAL_CAPSULE | Freq: Three times a day (TID) | ORAL | 0 refills | Status: AC
  Filled 2023-05-06: qty 18, 6d supply, fill #0

## 2023-05-06 MED ORDER — ACETAMINOPHEN 325 MG PO TABS
650.0000 mg | ORAL_TABLET | Freq: Four times a day (QID) | ORAL | Status: DC
  Administered 2023-05-06 (×3): 650 mg via ORAL
  Filled 2023-05-06 (×3): qty 2

## 2023-05-06 MED ORDER — OXYCODONE HCL 5 MG PO TABS
5.0000 mg | ORAL_TABLET | Freq: Four times a day (QID) | ORAL | 0 refills | Status: DC | PRN
  Filled 2023-05-06: qty 30, 7d supply, fill #0

## 2023-05-06 MED ORDER — POLYETHYLENE GLYCOL 3350 17 G PO PACK
17.0000 g | PACK | Freq: Every day | ORAL | Status: DC
  Administered 2023-05-06: 17 g via ORAL
  Filled 2023-05-06: qty 1

## 2023-05-06 MED ORDER — HYDRALAZINE HCL 20 MG/ML IJ SOLN
5.0000 mg | INTRAMUSCULAR | Status: DC | PRN
  Filled 2023-05-06: qty 1

## 2023-05-06 MED ORDER — EZETIMIBE 10 MG PO TABS
10.0000 mg | ORAL_TABLET | Freq: Every morning | ORAL | Status: DC
Start: 2023-05-06 — End: 2023-05-06
  Administered 2023-05-06: 10 mg via ORAL
  Filled 2023-05-06: qty 1

## 2023-05-06 MED ORDER — ORAL CARE MOUTH RINSE: 15.0000 mL | OROMUCOSAL | Status: DC | PRN

## 2023-05-06 MED ORDER — LEVOTHYROXINE SODIUM 88 MCG PO TABS
88.0000 ug | ORAL_TABLET | Freq: Every day | ORAL | Status: DC
  Administered 2023-05-06: 88 ug via ORAL
  Filled 2023-05-06 (×2): qty 1

## 2023-05-06 MED ORDER — LACTATED RINGERS IV SOLN: INTRAVENOUS | Status: DC

## 2023-05-06 MED ORDER — LOSARTAN POTASSIUM 50 MG PO TABS: 25.0000 mg | ORAL_TABLET | Freq: Every day | ORAL | Status: DC

## 2023-05-06 MED ORDER — METHOCARBAMOL 1000 MG/10ML IJ SOLN
500.0000 mg | Freq: Four times a day (QID) | INTRAMUSCULAR | Status: DC | PRN
  Filled 2023-05-06: qty 10

## 2023-05-06 NOTE — Discharge Summary (Signed)
 Physician Discharge Summary  Kristen Manning EXB:[id number] DOB: 10/20/1932 DOA: 05/05/2023  PCP: Jess Morita, MD  Admit date: 05/05/2023 Discharge date: 05/06/2023    Admitted From: Home Disposition: Home  Recommendations for Outpatient Follow-up:  Follow up with PCP in 5 to 7 days. Please obtain BMP/CBC in one week Please follow up with your PCP on the following pending results: Unresulted Labs (From admission, onward)    None         Home Health: Yes Equipment/Devices: Walker  Discharge Condition: Stable CODE STATUS: Full code Diet recommendation: Cardiac  Due to brief hospitalization, I have copied admitting hospitalist HPI as below.  HPI:     Kristen Manning  is a 89 y.o. female, with past medical history for hypertension, hyperlipidemia, hypothyroidism, GERD, patient presents to ED secondary to fall, head laceration, patient reports she was working in the yard today, patient reports her shoes caught and she fell forward striking her face when she was climbing up stairs going into her home from the yard, she denies any loss of consciousness, dizziness, syncope, urine or stool incontinence, patient lives with her brother, who witnessed the fall and called EMS, she is not on any blood thinners or antiplatelet therapy.. - In ED labs were significant for creatinine of 1.5, white blood cell of 10.6, CT chest/abdomen/pelvis was obtained with no acute injury/trauma, significant for mediastinal teratoma, thyroid  nodule, CT maxillofacial/cervical spine/head with no evidence of acute fracture, but significant for periorbital hematoma, she had significant facial laceration, and she had first distal phalanx fracture, [facility] hospitalist consulted to admit.    Subjective: Seen and examined this morning.  Son and brother at the bedside.  Patient is feeling "much much better".  Some left flank pain but no other complaint.  She is fully alert and oriented.  She prefers to go  home.  Brief/Interim Summary: Patient was briefly hospitalized for the below.  Details below.  Mechanical fall Left periorbital hematoma Left facial contusion/laceration Left posterior scalp laceration/contusion First distal phalanx fracture - This is mechanical fall, as she tripped . - Left facial laceration sutured, suture need to be removed in 5-7  days, defer to PCP, she was started on Keflex  empirically.  Will discharge on 6 more days to complete 7 days.  She received Tdap in the ED.  Pain is very well-controlled.  No fracture at all except first distal phalanx fracture in the left hand for which she will need to follow-up with hand surgery and she is fully aware for that.  She was assessed by PT OT and they recommended home health PT as well as walker.  She is stable for discharge today.  Incidental finding of thyroid  nodule on imaging - Routine outpatient ultrasound thyroid  can be done as an outpatient-defer to PCP   Incidental finding of teratoma on imaging - She told me that she is aware of that mass in the mediastinum for many years and she has seen some cardiologist for that before and has been told that since the mass is small and is not pressing on any organs so they would just watch and do the surveillance.  Recommend routine outpatient referral with CT surgery can be done upon discharge   GERD - Continue with PPI   AKI ruled out-CKD stage IIIb ruled in - Creatinine elevated at 1.5 upon arrival however upon extensive chart review, it appears that patient's baseline creatinine runs anywhere between 1-1.6 so this is her baseline and it has actually improved today  to 1.33 after she received IV hydration.  AKI ruled out.   Hypertension  Continue with home medication   Hyperlipidemia -continue with home Zetia  and fenofibrate   Discharge plan was discussed with patient and/or family member and they verbalized understanding and agreed with it.  Discharge Diagnoses:  Principal  Problem:   Fall Active Problems:   HTN (hypertension)   Hyperlipidemia   Depression   Hypothyroidism   GERD (gastroesophageal reflux disease)   Chronic left shoulder pain    Discharge Instructions   Allergies as of 05/06/2023       Reactions   Aspirin Other (See Comments), Nausea Only   Noted caused 2 holes in retina, not sure   Codeine Hives, Nausea Only   Statins    Intolerance to statins   Tizanidine Hcl Other (See Comments)   Confusion        Medication List     TAKE these medications    acetaminophen  500 MG tablet Commonly known as: TYLENOL  Take 2 tablets (1,000 mg total) by mouth every 6 (six) hours as needed.   b complex vitamins tablet Take 1 tablet by mouth daily.   Calcium  600-200 MG-UNIT tablet Take 1 tablet by mouth daily.   cephALEXin  500 MG capsule Commonly known as: KEFLEX  Take 1 capsule (500 mg total) by mouth every 8 (eight) hours for 6 days.   Coenzyme Q10 100 MG capsule Take 100 mg by mouth daily.   ezetimibe  10 MG tablet Commonly known as: ZETIA  Take 1 tablet (10 mg total) by mouth every morning.   fenofibrate  160 MG tablet Take 1 tablet (160 mg total) by mouth every morning.   levothyroxine  88 MCG tablet Commonly known as: SYNTHROID  Take 1 tablet (88 mcg total) by mouth daily before breakfast.   loratadine  10 MG tablet Commonly known as: CLARITIN  Take 1 tablet (10 mg total) by mouth daily. What changed:  when to take this reasons to take this   losartan  25 MG tablet Commonly known as: COZAAR  Take 1 tablet (25 mg total) by mouth daily.   metoprolol  succinate 25 MG 24 hr tablet Commonly known as: TOPROL -XL Take 1 tablet (25 mg total) by mouth daily.   omeprazole  20 MG capsule Commonly known as: PRILOSEC Take 1 capsule (20 mg total) by mouth every morning.   ondansetron  4 MG tablet Commonly known as: Zofran  Take 1 tablet (4 mg total) by mouth every 8 (eight) hours as needed for nausea or vomiting.   oxyCODONE  5 MG  immediate release tablet Commonly known as: Oxy IR/ROXICODONE  Take 1 tablet (5 mg total) by mouth every 6 (six) hours as needed for moderate pain (pain score 4-6).   triamcinolone  55 MCG/ACT Aero nasal inhaler Commonly known as: NASACORT  Place 2 sprays into the nose daily. What changed:  when to take this reasons to take this               Durable Medical Equipment  (From admission, onward)           Start     Ordered   05/06/23 1111  For home use only DME Walker rolling  Once       Question Answer Comment  Walker: With 5 Inch Wheels   Patient needs a walker to treat with the following condition Balance problem      05/06/23 1110            Follow-up Information     Tabori, Katherine E, MD Follow up in 1 week(s).  Specialty: Family Medicine Contact information: [id number]  [place] [address] [phone]                Allergies  Allergen Reactions   Aspirin Other (See Comments) and Nausea Only    Noted caused 2 holes in retina, not sure    Codeine Hives and Nausea Only   Statins     Intolerance to statins   Tizanidine Hcl Other (See Comments)    Confusion    Consultations: None   Procedures/Studies: CT CHEST ABDOMEN PELVIS W CONTRAST Result Date: 05/05/2023 CLINICAL DATA:  Blunt trauma, fell EXAM: CT CHEST, ABDOMEN, AND PELVIS WITH CONTRAST TECHNIQUE: Multidetector CT imaging of the chest, abdomen and pelvis was performed following the standard protocol during bolus administration of intravenous contrast. RADIATION DOSE REDUCTION: This exam was performed according to the departmental dose-optimization program which includes automated exposure control, adjustment of the mA and/or kV according to patient size and/or use of iterative reconstruction technique. CONTRAST:  65mL OMNIPAQUE  IOHEXOL  350 MG/ML SOLN COMPARISON:  08/10/2020, 10/07/2018 FINDINGS: CT CHEST FINDINGS Cardiovascular: The heart is unremarkable without pericardial  effusion. No evidence of thoracic aortic aneurysm or dissection. Atherosclerosis of the aorta and coronary vasculature. Mediastinum/Nodes: Complex cystic and solid mass within the anterior mediastinum is again identified, measuring 6.1 x 5.3 cm. Since prior imaging studies, the soft tissue component has enlarged, measuring up to 5.1 x 2.4 cm. Coarse calcification has developed along the superior margin of the mass. This may reflect enlarging teratoma. No pathologic adenopathy. Stable 2 cm hypodensity right lobe thyroid . Trachea and esophagus are unremarkable. Lungs/Pleura: No acute airspace disease, effusion, or pneumothorax. Mild bilateral lower lobe bronchiectasis. Musculoskeletal: No acute or destructive bony abnormalities. Reconstructed images demonstrate no additional findings. CT ABDOMEN PELVIS FINDINGS Hepatobiliary: Multiple calcified gallstones without evidence of acute cholecystitis. The liver is unremarkable. No biliary duct dilation. Pancreas: Unremarkable. No pancreatic ductal dilatation or surrounding inflammatory changes. Spleen: Normal in size without focal abnormality. Adrenals/Urinary Tract: Stable 1.9 cm left adrenal nodule, likely adenoma. Right adrenal is unremarkable. Kidneys enhance normally. No urinary tract calculi or obstruction. Bladder is moderately distended without filling defect. Stomach/Bowel: No bowel obstruction or ileus. The appendix, if still present, is not well visualized. Diverticulosis of the descending and sigmoid colon without evidence of diverticulitis. No bowel wall thickening or inflammatory change. Vascular/Lymphatic: Aortic atherosclerosis. No enlarged abdominal or pelvic lymph nodes. Reproductive: Status post hysterectomy. No adnexal masses. Other: No free fluid or free intraperitoneal gas. No abdominal wall hernia. Musculoskeletal: No acute or destructive bony abnormalities. Reconstructed images demonstrate no additional findings. IMPRESSION: 1. No acute  intrathoracic, intra-abdominal, or intrapelvic trauma. 2. Enlarging complex cystic and solid mass within the anterior mediastinum, with increasing soft tissue component and new coarse calcifications. 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 3. Cholelithiasis without evidence of acute cholecystitis. 4. Distal colonic diverticulosis without diverticulitis. 5.  Aortic Atherosclerosis (ICD10-I70.0). Electronically Signed   By: Bobbye Burrow M.D.   On: 05/05/2023 21:57   CT Head Wo Contrast Result Date: 05/05/2023 CLINICAL DATA:  Blunt facial trauma. Cervical trauma and ataxia. Head trauma. EXAM: CT HEAD WITHOUT CONTRAST CT MAXILLOFACIAL WITHOUT CONTRAST CT CERVICAL SPINE WITHOUT CONTRAST TECHNIQUE: Multidetector CT imaging of the head, cervical spine, and maxillofacial structures were performed using the standard protocol without intravenous contrast. Multiplanar CT image reconstructions of the cervical spine and maxillofacial structures were also generated. RADIATION DOSE REDUCTION: This exam was performed according to the departmental dose-optimization program which includes  automated exposure control, adjustment of the mA and/or kV according to patient size and/or use of iterative reconstruction technique. COMPARISON:  CT head 08/22/2021 FINDINGS: CT HEAD FINDINGS Brain: No evidence of acute infarction, hemorrhage, hydrocephalus, extra-axial collection or mass lesion/mass effect. Vascular: No hyperdense vessel or unexpected calcification. Skull: No acute fracture. Left posterior scalp laceration/contusion. Other: None. CT MAXILLOFACIAL FINDINGS Osseous: No fracture or mandibular dislocation. No destructive process. Orbits: Globes are intact.  Left periorbital hematoma. Sinuses: Sequela of chronic right maxillary sinusitis. No acute abnormality. Mastoid air cells are well aerated. Soft tissues: Left periorbital hematoma. Left face contusion and laceration. CT CERVICAL SPINE FINDINGS  Alignment: No evidence of traumatic listhesis. Skull base and vertebrae: No acute fracture. Soft tissues and spinal canal: No prevertebral fluid or swelling. No visible canal hematoma. Disc levels: Multilevel spondylosis and disc space height loss. No severe spinal canal narrowing. Upper chest: No acute abnormality. Other: 2.1 cm right thyroid  nodule. IMPRESSION: 1. No acute intracranial abnormality. 2. Left posterior scalp laceration/contusion. No calvarial fracture. 3. No acute facial fracture. Left periorbital hematoma. 4. Left face contusion/laceration. 5. No acute fracture or traumatic listhesis in the cervical spine. 6. 2.1 cm right thyroid  nodule. Follow-up with nonemergent thyroid  ultrasound if clinically appropriate given age and comorbidities. (ref: J Am Coll Radiol. 2015 Feb;12(2): 143-50). Electronically Signed   By: Rozell Cornet M.D.   On: 05/05/2023 18:57   CT Maxillofacial Wo Contrast Result Date: 05/05/2023 CLINICAL DATA:  Blunt facial trauma. Cervical trauma and ataxia. Head trauma. EXAM: CT HEAD WITHOUT CONTRAST CT MAXILLOFACIAL WITHOUT CONTRAST CT CERVICAL SPINE WITHOUT CONTRAST TECHNIQUE: Multidetector CT imaging of the head, cervical spine, and maxillofacial structures were performed using the standard protocol without intravenous contrast. Multiplanar CT image reconstructions of the cervical spine and maxillofacial structures were also generated. RADIATION DOSE REDUCTION: This exam was performed according to the departmental dose-optimization program which includes automated exposure control, adjustment of the mA and/or kV according to patient size and/or use of iterative reconstruction technique. COMPARISON:  CT head 08/22/2021 FINDINGS: CT HEAD FINDINGS Brain: No evidence of acute infarction, hemorrhage, hydrocephalus, extra-axial collection or mass lesion/mass effect. Vascular: No hyperdense vessel or unexpected calcification. Skull: No acute fracture. Left posterior scalp  laceration/contusion. Other: None. CT MAXILLOFACIAL FINDINGS Osseous: No fracture or mandibular dislocation. No destructive process. Orbits: Globes are intact.  Left periorbital hematoma. Sinuses: Sequela of chronic right maxillary sinusitis. No acute abnormality. Mastoid air cells are well aerated. Soft tissues: Left periorbital hematoma. Left face contusion and laceration. CT CERVICAL SPINE FINDINGS Alignment: No evidence of traumatic listhesis. Skull base and vertebrae: No acute fracture. Soft tissues and spinal canal: No prevertebral fluid or swelling. No visible canal hematoma. Disc levels: Multilevel spondylosis and disc space height loss. No severe spinal canal narrowing. Upper chest: No acute abnormality. Other: 2.1 cm right thyroid  nodule. IMPRESSION: 1. No acute intracranial abnormality. 2. Left posterior scalp laceration/contusion. No calvarial fracture. 3. No acute facial fracture. Left periorbital hematoma. 4. Left face contusion/laceration. 5. No acute fracture or traumatic listhesis in the cervical spine. 6. 2.1 cm right thyroid  nodule. Follow-up with nonemergent thyroid  ultrasound if clinically appropriate given age and comorbidities. (ref: J Am Coll Radiol. 2015 Feb;12(2): 143-50). Electronically Signed   By: Rozell Cornet M.D.   On: 05/05/2023 18:57   CT Cervical Spine Wo Contrast Result Date: 05/05/2023 CLINICAL DATA:  Blunt facial trauma. Cervical trauma and ataxia. Head trauma. EXAM: CT HEAD WITHOUT CONTRAST CT MAXILLOFACIAL WITHOUT CONTRAST CT CERVICAL SPINE WITHOUT CONTRAST TECHNIQUE:  Multidetector CT imaging of the head, cervical spine, and maxillofacial structures were performed using the standard protocol without intravenous contrast. Multiplanar CT image reconstructions of the cervical spine and maxillofacial structures were also generated. RADIATION DOSE REDUCTION: This exam was performed according to the departmental dose-optimization program which includes automated exposure  control, adjustment of the mA and/or kV according to patient size and/or use of iterative reconstruction technique. COMPARISON:  CT head 08/22/2021 FINDINGS: CT HEAD FINDINGS Brain: No evidence of acute infarction, hemorrhage, hydrocephalus, extra-axial collection or mass lesion/mass effect. Vascular: No hyperdense vessel or unexpected calcification. Skull: No acute fracture. Left posterior scalp laceration/contusion. Other: None. CT MAXILLOFACIAL FINDINGS Osseous: No fracture or mandibular dislocation. No destructive process. Orbits: Globes are intact.  Left periorbital hematoma. Sinuses: Sequela of chronic right maxillary sinusitis. No acute abnormality. Mastoid air cells are well aerated. Soft tissues: Left periorbital hematoma. Left face contusion and laceration. CT CERVICAL SPINE FINDINGS Alignment: No evidence of traumatic listhesis. Skull base and vertebrae: No acute fracture. Soft tissues and spinal canal: No prevertebral fluid or swelling. No visible canal hematoma. Disc levels: Multilevel spondylosis and disc space height loss. No severe spinal canal narrowing. Upper chest: No acute abnormality. Other: 2.1 cm right thyroid  nodule. IMPRESSION: 1. No acute intracranial abnormality. 2. Left posterior scalp laceration/contusion. No calvarial fracture. 3. No acute facial fracture. Left periorbital hematoma. 4. Left face contusion/laceration. 5. No acute fracture or traumatic listhesis in the cervical spine. 6. 2.1 cm right thyroid  nodule. Follow-up with nonemergent thyroid  ultrasound if clinically appropriate given age and comorbidities. (ref: J Am Coll Radiol. 2015 Feb;12(2): 143-50). Electronically Signed   By: Rozell Cornet M.D.   On: 05/05/2023 18:57   DG Hand Complete Right Result Date: 05/05/2023 CLINICAL DATA:  Trauma, fell, pain EXAM: RIGHT HAND - COMPLETE 3+ VIEW COMPARISON:  09/04/2016 FINDINGS: Frontal, oblique, lateral views of the right hand are obtained, with evaluation of the fourth digit  limited by pulse oximeter. There is a small intra-articular fracture at the ulnar aspect base of the first distal phalanx, with overlying soft tissue swelling of the first digit. No other acute bony abnormalities. Diffuse osteoarthritis, most severe at the first carpometacarpal joint. IMPRESSION: 1. Small intra-articular fracture at the base of the first distal phalanx, with overlying soft tissue swelling. 2. Osteoarthritis, greatest at the base of thumb. Electronically Signed   By: Bobbye Burrow M.D.   On: 05/05/2023 18:10   DG Hand Complete Left Result Date: 05/05/2023 CLINICAL DATA:  Marvell Slider, hand pain EXAM: LEFT HAND - COMPLETE 3+ VIEW COMPARISON:  09/04/2016 FINDINGS: Frontal, oblique, and lateral views of the left hand are obtained. There are no acute displaced fractures. Severe osteoarthritis of the base of the thumb. Mild diffuse interphalangeal joint space narrowing. Dorsal soft tissue swelling of the wrist, hand, and along the fifth digit. IMPRESSION: 1. No acute displaced fracture. 2. Dorsal soft tissue swelling. 3. Osteoarthritis, greatest at the base of the thumb. Electronically Signed   By: Bobbye Burrow M.D.   On: 05/05/2023 18:08   DG Chest Portable 1 View Result Date: 05/05/2023 CLINICAL DATA:  Marvell Slider, anticoagulated EXAM: PORTABLE CHEST 1 VIEW COMPARISON:  04/30/2021, 10/07/2018 FINDINGS: Single frontal view of the chest was obtained, with the patient rotated toward the right. Enlarged cardiac silhouette is likely due to AP portable technique and positioning. Rounded density along the right anterior mediastinal margin compatible with known cystic anterior mediastinal mass. Lung volumes are diminished with crowding the pulmonary vasculature. No airspace disease, effusion, or pneumothorax.  No displaced fracture. IMPRESSION: 1. Low lung volumes. 2. No acute intrathoracic process. 3. Rounded mass along the right anterior mediastinal margin consistent with known cystic mediastinal mass. Increased  prominence may be due to positioning and technique. Electronically Signed   By: Bobbye Burrow M.D.   On: 05/05/2023 18:07     Discharge Exam: Vitals:   05/06/23 0433 05/06/23 0736  BP: (!) 129/51 (!) 151/68  Pulse: 65 67  Resp: 16 16  Temp: 97.8 F (36.6 C) 98.2 F (36.8 C)  SpO2: 95% 95%   Vitals:   05/06/23 0002 05/06/23 0232 05/06/23 0433 05/06/23 0736  BP:  (!) 148/60 (!) 129/51 (!) 151/68  Pulse:  71 65 67  Resp:   16 16  Temp:   97.8 F (36.6 C) 98.2 F (36.8 C)  TempSrc: Oral   Oral  SpO2:   95% 95%  Weight:      Height:        General: Pt is alert, awake, not in acute distress Cardiovascular: RRR, S1/S2 +, no rubs, no gallops Respiratory: CTA bilaterally, no wheezing, no rhonchi Abdominal: Soft, NT, ND, bowel sounds + Extremities: no edema, no cyanosis Face/skin: Laceration on the left face, hematoma around left eye.  Multiple bruises on the face.    The results of significant diagnostics from this hospitalization (including imaging, microbiology, ancillary and laboratory) are listed below for reference.     Microbiology: No results found for this or any previous visit (from the past 240 hours).   Labs: BNP (last 3 results) No results for input(s): "BNP" in the last 8760 hours. Basic Metabolic Panel: Recent Labs  Lab 05/05/23 1729 05/06/23 0639  NA 136 135  K 4.2 4.8  CL 104 105  CO2 21* 24  GLUCOSE 112* 92  BUN 22 19  CREATININE 1.51* 1.33*  CALCIUM  9.2 8.6*   Liver Function Tests: Recent Labs  Lab 05/05/23 1729  AST 25  ALT 15  ALKPHOS 42  BILITOT 0.6  PROT 7.1  ALBUMIN 3.6   No results for input(s): "LIPASE", "AMYLASE" in the last 168 hours. No results for input(s): "AMMONIA" in the last 168 hours. CBC: Recent Labs  Lab 05/05/23 1729 05/06/23 0639  WBC 10.6* 9.9  NEUTROABS 7.1  --   HGB 12.7 10.6*  HCT 38.2 31.8*  MCV 92.9 91.6  PLT 304 258   Cardiac Enzymes: No results for input(s): "CKTOTAL", "CKMB", "CKMBINDEX",  "TROPONINI" in the last 168 hours. BNP: Invalid input(s): "POCBNP" CBG: No results for input(s): "GLUCAP" in the last 168 hours. D-Dimer No results for input(s): "DDIMER" in the last 72 hours. Hgb A1c No results for input(s): "HGBA1C" in the last 72 hours. Lipid Profile No results for input(s): "CHOL", "HDL", "LDLCALC", "TRIG", "CHOLHDL", "LDLDIRECT" in the last 72 hours. Thyroid  function studies No results for input(s): "TSH", "T4TOTAL", "T3FREE", "THYROIDAB" in the last 72 hours.  Invalid input(s): "FREET3" Anemia work up No results for input(s): "VITAMINB12", "FOLATE", "FERRITIN", "TIBC", "IRON", "RETICCTPCT" in the last 72 hours. Urinalysis    Component Value Date/Time   COLORURINE YELLOW 08/10/2020 0853   APPEARANCEUR CLEAR 08/10/2020 0853   LABSPEC <1.005 (L) 08/10/2020 0853   PHURINE 7.5 08/10/2020 0853   GLUCOSEU NEGATIVE 08/10/2020 0853   HGBUR NEGATIVE 08/10/2020 0853   BILIRUBINUR NEGATIVE 08/10/2020 0853   KETONESUR NEGATIVE 08/10/2020 0853   PROTEINUR NEGATIVE 08/10/2020 0853   UROBILINOGEN 0.2 02/24/2013 0207   NITRITE NEGATIVE 08/10/2020 0853   LEUKOCYTESUR NEGATIVE 08/10/2020 0853   Sepsis  Labs Recent Labs  Lab 05/05/23 1729 05/06/23 0639  WBC 10.6* 9.9   Microbiology No results found for this or any previous visit (from the past 240 hours).  FURTHER DISCHARGE INSTRUCTIONS:   Get Medicines reviewed and adjusted: Please take all your medications with you for your next visit with your Primary MD   Laboratory/radiological data: Please request your Primary MD to go over all hospital tests and procedure/radiological results at the follow up, please ask your Primary MD to get all Hospital records sent to his/her office.   In some cases, they will be blood work, cultures and biopsy results pending at the time of your discharge. Please request that your primary care M.D. goes through all the records of your hospital data and follows up on these results.    Also Note the following: If you experience worsening of your admission symptoms, develop shortness of breath, life threatening emergency, suicidal or homicidal thoughts you must seek medical attention immediately by calling 911 or calling your MD immediately  if symptoms less severe.   You must read complete instructions/literature along with all the possible adverse reactions/side effects for all the Medicines you take and that have been prescribed to you. Take any new Medicines after you have completely understood and accpet all the possible adverse reactions/side effects.    Do not drive when taking Pain medications or sleeping medications (Benzodaizepines)   Do not take more than prescribed Pain, Sleep and Anxiety Medications. It is not advisable to combine anxiety,sleep and pain medications without talking with your primary care practitioner   Special Instructions: If you have smoked or chewed Tobacco  in the last 2 yrs please stop smoking, stop any regular Alcohol  and or any Recreational drug use.   Wear Seat belts while driving.   Please note: You were cared for by a hospitalist during your hospital stay. Once you are discharged, your primary care physician will handle any further medical issues. Please note that NO REFILLS for any discharge medications will be authorized once you are discharged, as it is imperative that you return to your primary care physician (or establish a relationship with a primary care physician if you do not have one) for your post hospital discharge needs so that they can reassess your need for medications and monitor your lab values  Time coordinating discharge: Over 30 minutes  SIGNED:   Modena Andes, MD  [facility] 05/06/2023, 11:12 AM *Please note that this is a verbal dictation therefore any spelling or grammatical errors are due to the "[employer] One" system interpretation. If 7PM-7AM, please contact night-coverage [email or web address]

## 2023-05-06 NOTE — Evaluation (Signed)
 Occupational Therapy Evaluation Patient Details Name: Kristen Manning MRN: [id number] DOB: Jan 17, 1932 Today's Date: 05/06/2023   History of Present Illness   89 yo female presents to [facility] on 4/28 with fall, no LOC.  CT maxillofacial/cervical spine/head with no evidence of acute fracture, but significant for periorbital hematoma, she had significant facial laceration, and she had first distal phalanx fracture. PMH includes HTN, HLD, hypothyroidism, GERD, melanoma, MI, BTKR.     Clinical Impressions Pt admitted based on above, and was seen based on problem list below. PTA pt was independent with ADLs and IADLs Today pt is requiring s for safety to CGA for ADLs. Bed mobility and functional transfers are  s for safety. Pt son and brother present for session. Educated on use of DME and functional transfers in house. Pt demo return understanding of all education. Recommendation of HHOT. OT will continue to follow acutely to maximize functional independence.        If plan is discharge home, recommend the following:   A little help with walking and/or transfers;A little help with bathing/dressing/bathroom;Assistance with cooking/housework;Help with stairs or ramp for entrance     Functional Status Assessment   Patient has had a recent decline in their functional status and demonstrates the ability to make significant improvements in function in a reasonable and predictable amount of time.     Equipment Recommendations   None recommended by OT (All DME needs met)     Recommendations for Other Services         Precautions/Restrictions   Precautions Precautions: Fall Recall of Precautions/Restrictions: Intact Restrictions Weight Bearing Restrictions Per Provider Order: No     Mobility Bed Mobility Overal bed mobility: Needs Assistance Bed Mobility: Supine to Sit     Supine to sit: Contact guard, HOB elevated, Used rails          Transfers Overall transfer level:  Needs assistance Equipment used: Rolling walker (2 wheels) Transfers: Sit to/from Stand, Bed to chair/wheelchair/BSC Sit to Stand: Supervision     Step pivot transfers: Supervision     General transfer comment: Use of RW      Balance Overall balance assessment: Needs assistance, History of Falls Sitting-balance support: No upper extremity supported, Feet supported Sitting balance-Leahy Scale: Fair     Standing balance support: Bilateral upper extremity supported, During functional activity Standing balance-Leahy Scale: Poor Standing balance comment: reliant on AD       ADL either performed or assessed with clinical judgement   ADL Overall ADL's : Needs assistance/impaired Eating/Feeding: Set up;Sitting   Grooming: Set up;Sitting           Upper Body Dressing : Set up;Sitting   Lower Body Dressing: Contact guard assist;Sit to/from stand Lower Body Dressing Details (indicate cue type and reason): To pull pants above waist Toilet Transfer: Ambulation;Supervision/safety;Regular Toilet;Rolling walker (2 wheels);Grab bars   Toileting- Clothing Manipulation and Hygiene: Contact guard assist;Sit to/from stand Toileting - Clothing Manipulation Details (indicate cue type and reason): CGA for balance during standing     Functional mobility during ADLs: Supervision/safety;Rolling walker (2 wheels) General ADL Comments: Educated on use of sock aid for LB dressing and completing shower transfer     Vision Baseline Vision/History: 0 No visual deficits Vision Assessment?: No apparent visual deficits            Pertinent Vitals/Pain Pain Assessment Pain Assessment: Faces Faces Pain Scale: Hurts a little bit Pain Location: head (baseline headaches), hips (bruised) Pain Descriptors / Indicators: Sore Pain Intervention(s):  Monitored during session     Extremity/Trunk Assessment Upper Extremity Assessment Upper Extremity Assessment: Generalized weakness;Right hand  dominant;RUE deficits/detail;LUE deficits/detail RUE Deficits / Details: Broken index finger. Decreased shoulder ROM WFL LUE Deficits / Details: Per pt pmh of rotator cuff injury, WFL   Lower Extremity Assessment Lower Extremity Assessment: Defer to PT evaluation   Cervical / Trunk Assessment Cervical / Trunk Assessment: Normal   Communication Communication Communication: No apparent difficulties Factors Affecting Communication: Hearing impaired   Cognition Arousal: Alert Behavior During Therapy: WFL for tasks assessed/performed Cognition: No apparent impairments       Following commands: Intact       Cueing  General Comments   Cueing Techniques: Verbal cues  Bruise and skin lacerations on face, splint on R index finger, and bandage on R hand, brother and son present for session           Home Living Family/patient expects to be discharged to:: Private residence Living Arrangements: Other relatives (brother) Available Help at Discharge: Family (son does not live with her but can help PRN, her brother has cancer but pt states "we help each other") Type of Home: House Home Access: Stairs to enter Secretary/administrator of Steps: 1 Entrance Stairs-Rails: Right Home Layout: One level     Bathroom Shower/Tub: Producer, television/film/video: Standard Bathroom Accessibility: Yes How Accessible: Accessible via walker Home Equipment: Grab bars - tub/shower;Grab bars - toilet;Shower seat;BSC/3in1   Additional Comments: does not have DME of her own, states she needs a RW      Prior Functioning/Environment Prior Level of Function : Independent/Modified Independent;History of Falls (last six months)             Mobility Comments: pt typically ambulatory without AD, but states she is very unsteady. history of x2 recent falls ADLs Comments: reports difficulty with socks    OT Problem List: Decreased strength;Decreased range of motion;Decreased activity  tolerance;Impaired balance (sitting and/or standing);Decreased safety awareness;Decreased knowledge of use of DME or AE   OT Treatment/Interventions: Self-care/ADL training;Therapeutic exercise;Energy conservation;DME and/or AE instruction;Therapeutic activities;Patient/family education;Balance training      OT Goals(Current goals can be found in the care plan section)   Acute Rehab OT Goals Patient Stated Goal: To go home OT Goal Formulation: With patient Time For Goal Achievement: 05/20/23 Potential to Achieve Goals: Good   OT Frequency:  Min 2X/week       AM-PAC OT "6 Clicks" Daily Activity     Outcome Measure Help from another person eating meals?: None Help from another person taking care of personal grooming?: A Little Help from another person toileting, which includes using toliet, bedpan, or urinal?: A Little Help from another person bathing (including washing, rinsing, drying)?: A Little Help from another person to put on and taking off regular upper body clothing?: A Little Help from another person to put on and taking off regular lower body clothing?: A Little 6 Click Score: 19   End of Session Equipment Utilized During Treatment: Rolling walker (2 wheels) Nurse Communication: Mobility status  Activity Tolerance: Patient tolerated treatment well Patient left: in bed;with call bell/phone within reach;with bed alarm set;with family/visitor present  OT Visit Diagnosis: Unsteadiness on feet (R26.81);Other abnormalities of gait and mobility (R26.89);Muscle weakness (generalized) (M62.81)                Time: 1610-9604 OT Time Calculation (min): 30 min Charges:  OT General Charges $OT Visit: 1 Visit OT Evaluation $OT Eval Moderate Complexity: 1  Mod OT Treatments $Self Care/Home Management : 8-22 mins  Delmer Ferraris, OT  Acute Rehabilitation Services Office [phone] Secure chat preferred   Mickael Alamo 05/06/2023, 2:40 PM

## 2023-05-06 NOTE — H&P (Signed)
 [facility] H&P   Patient Demographics:    Kristen Manning, is a 89 y.o. female  MRN: [id number]   DOB - 1932-07-24  Admit Date - 05/05/2023  Outpatient Primary MD for the patient is Jess Morita, MD  Referring MD/NP/PA: Dr. Florie Husband  Patient coming from: Home  Chief Complaint  Patient presents with   Fall   Laceration      HPI:    Kristen Manning  is a 89 y.o. female, with past medical history for hypertension, hyperlipidemia, hypothyroidism, GERD, patient presents to ED secondary to fall, head laceration, patient reports she was working in the yard today, patient reports her shoes caught and she fell forward striking her face when she was climbing up stairs going into her home from the yard, she denies any loss of consciousness, dizziness, syncope, urine or stool incontinence, patient lives with her brother, who witnessed the fall and called EMS, she is not on any blood thinners or antiplatelet therapy.. - In ED labs were significant for creatinine of 1.5, white blood cell of 10.6, CT chest/abdomen/pelvis was obtained with no acute injury/trauma, significant for mediastinal teratoma, thyroid  nodule, CT maxillofacial/cervical spine/head with no evidence of acute fracture, but significant for periorbital hematoma, she had significant facial laceration, and she had first distal phalanx fracture, [facility] hospitalist consulted to admit.    Review of systems:      A full 10 point Review of Systems was done, except as stated above, all other Review of Systems were negative.   With Past History of the following :    Past Medical History:  Diagnosis Date   Arthritis    Bacterial infection    in lungs in Jan 2015   Bilateral lower extremity edema    Cancer (HCC)    skin cancer, back, legs melonama, sees Dr Gara July, derm   Chronic back pain    GERD (gastroesophageal reflux disease)     takes Omeprazole  daily   Headache(784.0)    History of migraine    last one about 15 yrs ago   Hyperlipidemia    takes Zetia  daily   Hypertension    takes HCTZ daily   Joint pain    Joint swelling    Myocardial infarction [facility])    pt states EKG always shows infarct but no change from previous ekg;never knew anything about it   Nocturia    Osteoarthritis    Pneumonia    hx of-many yrs ago   Pneumonia    PONV (postoperative nausea and vomiting)    Shortness of breath    with exertion   Urinary frequency    Urinary incontinence    Urinary urgency       Past Surgical History:  Procedure Laterality Date   ABDOMINAL HYSTERECTOMY     bladder tack  01/08/1996   BLEPHAROPLASTY Bilateral    JOINT REPLACEMENT Right 02/22/2013  Knee   OOPHORECTOMY  01/08/1996   only one   REPLACEMENT TOTAL KNEE Left 10/02/2009   TONSILLECTOMY  as a child ? date   TOTAL KNEE ARTHROPLASTY Right 02/22/2013   Procedure: TOTAL KNEE ARTHROPLASTY;  Surgeon: Christie Cox, MD;  Location: [facility] OR;  Service: Orthopedics;  Laterality: Right;      Social History:     Social History   Tobacco Use   Smoking status: Never   Smokeless tobacco: Never  Substance Use Topics   Alcohol use: No       Family History :     Family History  Problem Relation Age of Onset   Kidney disease Mother    Heart disease Mother    Hypertension Mother    Varicose Veins Mother    Kidney failure Mother    Alcohol abuse Father    Stroke Father        several    Hypertension Father    Varicose Veins Father    Alcohol abuse Brother    Hyperlipidemia Brother    Hypertension Brother    Early death Brother    Varicose Veins Brother    Bladder Cancer Brother    Heart disease Brother    Hypertension Brother    Arthritis Brother        Gout   Varicose Veins Son    Deep vein thrombosis Son    Colon cancer Son    Breast cancer Maternal Aunt    Pancreatic cancer Neg Hx    Stomach cancer Neg Hx    Esophageal  cancer Neg Hx    Liver disease Neg Hx       Home Medications:   Prior to Admission medications   Medication Sig Start Date End Date Taking? Authorizing Provider  acetaminophen  (TYLENOL ) 500 MG tablet Take 2 tablets (1,000 mg total) by mouth every 6 (six) hours as needed. 03/20/20   Coretta Dexter, PA  b complex vitamins tablet Take 1 tablet by mouth daily.    [provider]  Calcium  600-200 MG-UNIT tablet Take 1 tablet by mouth daily.    [provider]  Coenzyme Q10 100 MG capsule Take 100 mg by mouth daily.     [provider]  ezetimibe  (ZETIA ) 10 MG tablet Take 1 tablet (10 mg total) by mouth every morning. 03/26/23   Tabori, Katherine E, MD  fenofibrate  160 MG tablet Take 1 tablet (160 mg total) by mouth every morning. 03/26/23   Tabori, Katherine E, MD  levothyroxine  (SYNTHROID ) 88 MCG tablet Take 1 tablet (88 mcg total) by mouth daily before breakfast. 03/26/23   Tabori, Katherine E, MD  loratadine  (CLARITIN ) 10 MG tablet Take 1 tablet (10 mg total) by mouth daily. Patient taking differently: Take 10 mg by mouth as needed for allergies, itching or rhinitis. 01/16/17   Tabori, Katherine E, MD  losartan  (COZAAR ) 25 MG tablet Take 1 tablet (25 mg total) by mouth daily. 03/26/23   Tabori, Katherine E, MD  metoprolol  succinate (TOPROL -XL) 25 MG 24 hr tablet Take 1 tablet (25 mg total) by mouth daily. 03/26/23   Tabori, Katherine E, MD  omeprazole  (PRILOSEC) 20 MG capsule Take 1 capsule (20 mg total) by mouth every morning. 03/26/23   Tabori, Katherine E, MD  ondansetron  (ZOFRAN ) 4 MG tablet Take 1 tablet (4 mg total) by mouth every 8 (eight) hours as needed for nausea or vomiting. 05/02/21   Tabori, Katherine E, MD  Polyethylene Glycol 3350 (MIRALAX PO) Take  17 g by mouth daily as needed.    [provider]  triamcinolone  (NASACORT ) 55 MCG/ACT AERO nasal inhaler Place 2 sprays into the nose daily. Patient taking differently: Place 2 sprays into the nose as  needed. 11/17/17   Orvel Blanco, MD     Allergies:     Allergies  Allergen Reactions   Aspirin Other (See Comments) and Nausea Only    Noted caused 2 holes in retina, not sure    Codeine Hives and Nausea Only   Statins     Intolerance to statins   Tizanidine Hcl Other (See Comments)    Confusion     Physical Exam:   Vitals  Blood pressure (!) 179/73, pulse 75, temperature (!) 97.4 F (36.3 C), resp. rate 19, height 5\' 4"  (1.626 m), weight 90.7 kg, SpO2 98%.   1. General frail elderly female, laying in bed, with multiple bruising  2. Normal affect and insight, Not Suicidal or Homicidal, Awake Alert, Oriented X 3.  3.  Left eyes swollen, with Elvin Hammer orbital bruising, and left cheek laceration, sutured, left scalp laceration as well, and is having bruise in left shoulder (at baseline she has frozen shoulder) and laceration in the left and right hands, please see pictures below   4. Ears and Eyes appear Normal, Conjunctivae clear, PERRLA. Moist Oral Mucosa.  5. Supple Neck, No JVD, No cervical lymphadenopathy appriciated, No Carotid Bruits.  6. Symmetrical Chest wall movement, Good air movement bilaterally, CTAB.  7. RRR, No Gallops, Rubs or Murmurs, No Parasternal Heave.  8. Positive Bowel Sounds, Abdomen Soft, No tenderness, No organomegaly appriciated,No rebound -guarding or rigidity.  9.  No Cyanosis, Normal Skin Turgor, No Skin Rash or Bruise.  Chronic lower extremity skin changes  10. Good muscle tone,  joints appear normal , no effusions, Normal ROM.                    Data Review:    CBC Recent Labs  Lab 05/05/23 1729  WBC 10.6*  HGB 12.7  HCT 38.2  PLT 304  MCV 92.9  MCH 30.9  MCHC 33.2  RDW 13.4  LYMPHSABS 2.4  MONOABS 0.9  EOSABS 0.1  BASOSABS 0.0   ------------------------------------------------------------------------------------------------------------------  Chemistries  Recent Labs  Lab 05/05/23 1729  NA 136   K 4.2  CL 104  CO2 21*  GLUCOSE 112*  BUN 22  CREATININE 1.51*  CALCIUM  9.2  AST 25  ALT 15  ALKPHOS 42  BILITOT 0.6   ------------------------------------------------------------------------------------------------------------------ estimated creatinine clearance is 28.1 mL/min (A) (by C-G formula based on SCr of 1.51 mg/dL (H)). ------------------------------------------------------------------------------------------------------------------ No results for input(s): "TSH", "T4TOTAL", "T3FREE", "THYROIDAB" in the last 72 hours.  Invalid input(s): "FREET3"  Coagulation profile No results for input(s): "INR", "PROTIME" in the last 168 hours. ------------------------------------------------------------------------------------------------------------------- No results for input(s): "DDIMER" in the last 72 hours. -------------------------------------------------------------------------------------------------------------------  Cardiac Enzymes No results for input(s): "CKMB", "TROPONINI", "MYOGLOBIN" in the last 168 hours.  Invalid input(s): "CK" ------------------------------------------------------------------------------------------------------------------ No results found for: "BNP"   ---------------------------------------------------------------------------------------------------------------  Urinalysis    Component Value Date/Time   COLORURINE YELLOW 08/10/2020 0853   APPEARANCEUR CLEAR 08/10/2020 0853   LABSPEC <1.005 (L) 08/10/2020 0853   PHURINE 7.5 08/10/2020 0853   GLUCOSEU NEGATIVE 08/10/2020 0853   HGBUR NEGATIVE 08/10/2020 0853   BILIRUBINUR NEGATIVE 08/10/2020 0853   KETONESUR NEGATIVE 08/10/2020 0853   PROTEINUR NEGATIVE 08/10/2020 0853   UROBILINOGEN 0.2 02/24/2013 0207   NITRITE NEGATIVE 08/10/2020 2956  LEUKOCYTESUR NEGATIVE 08/10/2020 0853     ----------------------------------------------------------------------------------------------------------------   Imaging Results:    CT CHEST ABDOMEN PELVIS W CONTRAST Result Date: 05/05/2023 CLINICAL DATA:  Blunt trauma, fell EXAM: CT CHEST, ABDOMEN, AND PELVIS WITH CONTRAST TECHNIQUE: Multidetector CT imaging of the chest, abdomen and pelvis was performed following the standard protocol during bolus administration of intravenous contrast. RADIATION DOSE REDUCTION: This exam was performed according to the departmental dose-optimization program which includes automated exposure control, adjustment of the mA and/or kV according to patient size and/or use of iterative reconstruction technique. CONTRAST:  65mL OMNIPAQUE  IOHEXOL  350 MG/ML SOLN COMPARISON:  08/10/2020, 10/07/2018 FINDINGS: CT CHEST FINDINGS Cardiovascular: The heart is unremarkable without pericardial effusion. No evidence of thoracic aortic aneurysm or dissection. Atherosclerosis of the aorta and coronary vasculature. Mediastinum/Nodes: Complex cystic and solid mass within the anterior mediastinum is again identified, measuring 6.1 x 5.3 cm. Since prior imaging studies, the soft tissue component has enlarged, measuring up to 5.1 x 2.4 cm. Coarse calcification has developed along the superior margin of the mass. This may reflect enlarging teratoma. No pathologic adenopathy. Stable 2 cm hypodensity right lobe thyroid . Trachea and esophagus are unremarkable. Lungs/Pleura: No acute airspace disease, effusion, or pneumothorax. Mild bilateral lower lobe bronchiectasis. Musculoskeletal: No acute or destructive bony abnormalities. Reconstructed images demonstrate no additional findings. CT ABDOMEN PELVIS FINDINGS Hepatobiliary: Multiple calcified gallstones without evidence of acute cholecystitis. The liver is unremarkable. No biliary duct dilation. Pancreas: Unremarkable. No pancreatic ductal dilatation or surrounding inflammatory changes. Spleen:  Normal in size without focal abnormality. Adrenals/Urinary Tract: Stable 1.9 cm left adrenal nodule, likely adenoma. Right adrenal is unremarkable. Kidneys enhance normally. No urinary tract calculi or obstruction. Bladder is moderately distended without filling defect. Stomach/Bowel: No bowel obstruction or ileus. The appendix, if still present, is not well visualized. Diverticulosis of the descending and sigmoid colon without evidence of diverticulitis. No bowel wall thickening or inflammatory change. Vascular/Lymphatic: Aortic atherosclerosis. No enlarged abdominal or pelvic lymph nodes. Reproductive: Status post hysterectomy. No adnexal masses. Other: No free fluid or free intraperitoneal gas. No abdominal wall hernia. Musculoskeletal: No acute or destructive bony abnormalities. Reconstructed images demonstrate no additional findings. IMPRESSION: 1. No acute intrathoracic, intra-abdominal, or intrapelvic trauma. 2. Enlarging complex cystic and solid mass within the anterior mediastinum, with increasing soft tissue component and new coarse calcifications. 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 3. Cholelithiasis without evidence of acute cholecystitis. 4. Distal colonic diverticulosis without diverticulitis. 5.  Aortic Atherosclerosis (ICD10-I70.0). Electronically Signed   By: Bobbye Burrow M.D.   On: 05/05/2023 21:57   CT Head Wo Contrast Result Date: 05/05/2023 CLINICAL DATA:  Blunt facial trauma. Cervical trauma and ataxia. Head trauma. EXAM: CT HEAD WITHOUT CONTRAST CT MAXILLOFACIAL WITHOUT CONTRAST CT CERVICAL SPINE WITHOUT CONTRAST TECHNIQUE: Multidetector CT imaging of the head, cervical spine, and maxillofacial structures were performed using the standard protocol without intravenous contrast. Multiplanar CT image reconstructions of the cervical spine and maxillofacial structures were also generated. RADIATION DOSE REDUCTION: This exam was performed according to the  departmental dose-optimization program which includes automated exposure control, adjustment of the mA and/or kV according to patient size and/or use of iterative reconstruction technique. COMPARISON:  CT head 08/22/2021 FINDINGS: CT HEAD FINDINGS Brain: No evidence of acute infarction, hemorrhage, hydrocephalus, extra-axial collection or mass lesion/mass effect. Vascular: No hyperdense vessel or unexpected calcification. Skull: No acute fracture. Left posterior scalp laceration/contusion. Other: None. CT MAXILLOFACIAL FINDINGS Osseous: No fracture or mandibular dislocation. No destructive process. Orbits: Globes are intact.  Left periorbital hematoma. Sinuses: Sequela of chronic right maxillary sinusitis. No acute abnormality. Mastoid air cells are well aerated. Soft tissues: Left periorbital hematoma. Left face contusion and laceration. CT CERVICAL SPINE FINDINGS Alignment: No evidence of traumatic listhesis. Skull base and vertebrae: No acute fracture. Soft tissues and spinal canal: No prevertebral fluid or swelling. No visible canal hematoma. Disc levels: Multilevel spondylosis and disc space height loss. No severe spinal canal narrowing. Upper chest: No acute abnormality. Other: 2.1 cm right thyroid  nodule. IMPRESSION: 1. No acute intracranial abnormality. 2. Left posterior scalp laceration/contusion. No calvarial fracture. 3. No acute facial fracture. Left periorbital hematoma. 4. Left face contusion/laceration. 5. No acute fracture or traumatic listhesis in the cervical spine. 6. 2.1 cm right thyroid  nodule. Follow-up with nonemergent thyroid  ultrasound if clinically appropriate given age and comorbidities. (ref: J Am Coll Radiol. 2015 Feb;12(2): 143-50). Electronically Signed   By: Rozell Cornet M.D.   On: 05/05/2023 18:57   CT Maxillofacial Wo Contrast Result Date: 05/05/2023 CLINICAL DATA:  Blunt facial trauma. Cervical trauma and ataxia. Head trauma. EXAM: CT HEAD WITHOUT CONTRAST CT MAXILLOFACIAL  WITHOUT CONTRAST CT CERVICAL SPINE WITHOUT CONTRAST TECHNIQUE: Multidetector CT imaging of the head, cervical spine, and maxillofacial structures were performed using the standard protocol without intravenous contrast. Multiplanar CT image reconstructions of the cervical spine and maxillofacial structures were also generated. RADIATION DOSE REDUCTION: This exam was performed according to the departmental dose-optimization program which includes automated exposure control, adjustment of the mA and/or kV according to patient size and/or use of iterative reconstruction technique. COMPARISON:  CT head 08/22/2021 FINDINGS: CT HEAD FINDINGS Brain: No evidence of acute infarction, hemorrhage, hydrocephalus, extra-axial collection or mass lesion/mass effect. Vascular: No hyperdense vessel or unexpected calcification. Skull: No acute fracture. Left posterior scalp laceration/contusion. Other: None. CT MAXILLOFACIAL FINDINGS Osseous: No fracture or mandibular dislocation. No destructive process. Orbits: Globes are intact.  Left periorbital hematoma. Sinuses: Sequela of chronic right maxillary sinusitis. No acute abnormality. Mastoid air cells are well aerated. Soft tissues: Left periorbital hematoma. Left face contusion and laceration. CT CERVICAL SPINE FINDINGS Alignment: No evidence of traumatic listhesis. Skull base and vertebrae: No acute fracture. Soft tissues and spinal canal: No prevertebral fluid or swelling. No visible canal hematoma. Disc levels: Multilevel spondylosis and disc space height loss. No severe spinal canal narrowing. Upper chest: No acute abnormality. Other: 2.1 cm right thyroid  nodule. IMPRESSION: 1. No acute intracranial abnormality. 2. Left posterior scalp laceration/contusion. No calvarial fracture. 3. No acute facial fracture. Left periorbital hematoma. 4. Left face contusion/laceration. 5. No acute fracture or traumatic listhesis in the cervical spine. 6. 2.1 cm right thyroid  nodule. Follow-up  with nonemergent thyroid  ultrasound if clinically appropriate given age and comorbidities. (ref: J Am Coll Radiol. 2015 Feb;12(2): 143-50). Electronically Signed   By: Rozell Cornet M.D.   On: 05/05/2023 18:57   CT Cervical Spine Wo Contrast Result Date: 05/05/2023 CLINICAL DATA:  Blunt facial trauma. Cervical trauma and ataxia. Head trauma. EXAM: CT HEAD WITHOUT CONTRAST CT MAXILLOFACIAL WITHOUT CONTRAST CT CERVICAL SPINE WITHOUT CONTRAST TECHNIQUE: Multidetector CT imaging of the head, cervical spine, and maxillofacial structures were performed using the standard protocol without intravenous contrast. Multiplanar CT image reconstructions of the cervical spine and maxillofacial structures were also generated. RADIATION DOSE REDUCTION: This exam was performed according to the departmental dose-optimization program which includes automated exposure control, adjustment of the mA and/or kV according to patient size and/or use of iterative reconstruction technique. COMPARISON:  CT head 08/22/2021 FINDINGS: CT HEAD  FINDINGS Brain: No evidence of acute infarction, hemorrhage, hydrocephalus, extra-axial collection or mass lesion/mass effect. Vascular: No hyperdense vessel or unexpected calcification. Skull: No acute fracture. Left posterior scalp laceration/contusion. Other: None. CT MAXILLOFACIAL FINDINGS Osseous: No fracture or mandibular dislocation. No destructive process. Orbits: Globes are intact.  Left periorbital hematoma. Sinuses: Sequela of chronic right maxillary sinusitis. No acute abnormality. Mastoid air cells are well aerated. Soft tissues: Left periorbital hematoma. Left face contusion and laceration. CT CERVICAL SPINE FINDINGS Alignment: No evidence of traumatic listhesis. Skull base and vertebrae: No acute fracture. Soft tissues and spinal canal: No prevertebral fluid or swelling. No visible canal hematoma. Disc levels: Multilevel spondylosis and disc space height loss. No severe spinal canal  narrowing. Upper chest: No acute abnormality. Other: 2.1 cm right thyroid  nodule. IMPRESSION: 1. No acute intracranial abnormality. 2. Left posterior scalp laceration/contusion. No calvarial fracture. 3. No acute facial fracture. Left periorbital hematoma. 4. Left face contusion/laceration. 5. No acute fracture or traumatic listhesis in the cervical spine. 6. 2.1 cm right thyroid  nodule. Follow-up with nonemergent thyroid  ultrasound if clinically appropriate given age and comorbidities. (ref: J Am Coll Radiol. 2015 Feb;12(2): 143-50). Electronically Signed   By: Rozell Cornet M.D.   On: 05/05/2023 18:57   DG Hand Complete Right Result Date: 05/05/2023 CLINICAL DATA:  Trauma, fell, pain EXAM: RIGHT HAND - COMPLETE 3+ VIEW COMPARISON:  09/04/2016 FINDINGS: Frontal, oblique, lateral views of the right hand are obtained, with evaluation of the fourth digit limited by pulse oximeter. There is a small intra-articular fracture at the ulnar aspect base of the first distal phalanx, with overlying soft tissue swelling of the first digit. No other acute bony abnormalities. Diffuse osteoarthritis, most severe at the first carpometacarpal joint. IMPRESSION: 1. Small intra-articular fracture at the base of the first distal phalanx, with overlying soft tissue swelling. 2. Osteoarthritis, greatest at the base of thumb. Electronically Signed   By: Bobbye Burrow M.D.   On: 05/05/2023 18:10   DG Hand Complete Left Result Date: 05/05/2023 CLINICAL DATA:  Marvell Slider, hand pain EXAM: LEFT HAND - COMPLETE 3+ VIEW COMPARISON:  09/04/2016 FINDINGS: Frontal, oblique, and lateral views of the left hand are obtained. There are no acute displaced fractures. Severe osteoarthritis of the base of the thumb. Mild diffuse interphalangeal joint space narrowing. Dorsal soft tissue swelling of the wrist, hand, and along the fifth digit. IMPRESSION: 1. No acute displaced fracture. 2. Dorsal soft tissue swelling. 3. Osteoarthritis, greatest at the  base of the thumb. Electronically Signed   By: Bobbye Burrow M.D.   On: 05/05/2023 18:08   DG Chest Portable 1 View Result Date: 05/05/2023 CLINICAL DATA:  Marvell Slider, anticoagulated EXAM: PORTABLE CHEST 1 VIEW COMPARISON:  04/30/2021, 10/07/2018 FINDINGS: Single frontal view of the chest was obtained, with the patient rotated toward the right. Enlarged cardiac silhouette is likely due to AP portable technique and positioning. Rounded density along the right anterior mediastinal margin compatible with known cystic anterior mediastinal mass. Lung volumes are diminished with crowding the pulmonary vasculature. No airspace disease, effusion, or pneumothorax. No displaced fracture. IMPRESSION: 1. Low lung volumes. 2. No acute intrathoracic process. 3. Rounded mass along the right anterior mediastinal margin consistent with known cystic mediastinal mass. Increased prominence may be due to positioning and technique. Electronically Signed   By: Bobbye Burrow M.D.   On: 05/05/2023 18:07   EKG: Vent. rate 82 BPM PR interval 194 ms QRS duration 112 ms QT/QTcB 380/444 ms P-R-T axes 34 -12 16 Sinus rhythm  Low voltage, precordial leads Probable left ventricular hypertrophy  Assessment & Plan:    Principal Problem:   Fall Active Problems:   HTN (hypertension)   Hyperlipidemia   Depression   Hypothyroidism   GERD (gastroesophageal reflux disease)   Chronic left shoulder pain   Mechanical fall Left periorbital hematoma Left facial contusion/laceration Left posterior scalp laceration/contusion First distal phalanx fracture - This is mechanical fall, as she tripped . - Left facial laceration sutured, suture need to be removed in 7-10  days, will keep empirically on Keflex  - She received Tdap - Will consult PT, OT - Will continue with scheduled Tylenol , and as needed oxycodone  for pain - Splint applied for her left distal phalanx fracture, she can have routine follow-up with hand surgery as an  outpatient  Incidental finding of thyroid  nodule on imaging - Routine outpatient ultrasound thyroid  can be done as an outpatient  Incidental finding of teratoma on imaging - Routine outpatient referral with CT surgery can be done upon discharge  GERD - Continue with PPI  AKI - Creatinine elevated at 1.5, will keep on IV fluids specially she received IV contrast - Will check total CK  Hypertension  Continue with home medication, will avoid losartan  for now given elevated creatinine, will add as needed hydralazine  Hyperlipidemia -continue with home Zetia  and fenofibrate    DVT Prophylaxis SCDs, will hold on pharmacologic DVT prophylaxis due to multiple lacerations and bruising  AM Labs Ordered, also please review Full Orders  Family Communication: Admission, patients condition and plan of care including tests being ordered have been discussed with the patient  who indicate understanding and agree with the plan and Code Status.  Code Status full code  Likely DC to home home  Consults called: None  Admission status: Observation  Time spent in minutes : 70 minutes   Seena Dadds M.D on 05/06/2023 at 1:47 AM   [facility] - Office  [phone]

## 2023-05-06 NOTE — Progress Notes (Signed)
 Orthopedic Tech Progress Note Patient Details:  GORDON KATHOL 18-Mar-1932 [id number] Applied finger splint per order.  Ortho Devices Type of Ortho Device: Finger splint Ortho Device/Splint Location: RUE Ortho Device/Splint Interventions: Ordered, Application, Adjustment   Post Interventions Patient Tolerated: Well Instructions Provided: Adjustment of device, Care of device  Rayna Calkin 05/06/2023, 12:14 AM

## 2023-05-06 NOTE — TOC CAGE-AID Note (Signed)
 Transition of Care [facility]) - CAGE-AID Screening   Patient Details  Name: Kristen Manning MRN: [id number] Date of Birth: 01/06/1933  Transition of Care [facility]) CM/SW Contact:    Jadamarie Butson E Yailen Zemaitis, LCSW Phone Number: 05/06/2023, 9:28 AM   Clinical Narrative: Patient denied substance use, states she has never used drugs or alcohol.   CAGE-AID Screening:    Have You Ever Felt You Ought to Cut Down on Your Drinking or Drug Use?: No Have People Annoyed You By Critizing Your Drinking Or Drug Use?: No Have You Felt Bad Or Guilty About Your Drinking Or Drug Use?: No Have You Ever Had a Drink or Used Drugs First Thing In The Morning to Steady Your Nerves or to Get Rid of a Hangover?: No CAGE-AID Score: 0  Substance Abuse Education Offered: No

## 2023-05-06 NOTE — Evaluation (Signed)
 Physical Therapy Evaluation Patient Details Name: Kristen Manning MRN: [id number] DOB: [birth year] Today's Date: 05/06/2023  History of Present Illness  89 yo female presents to [facility] on 4/28 with fall, no LOC.  CT maxillofacial/cervical spine/head with no evidence of acute fracture, but significant for periorbital hematoma, she had significant facial laceration, and she had first distal phalanx fracture. PMH includes HTN, HLD, hypothyroidism, GERD, melanoma, MI, BTKR.  Clinical Impression   Pt presents with LE weakness, impaired balance with history of falls, impaired activity tolerance vs baseline. Pt to benefit from acute PT to address deficits. Pt ambulated short hallway distance x2, requiring seated rest break to recover fatigue. Pt requiring supervision when up and moving, but is a high fall risk and benefits from use of RW. Pt lives with her brother who is sick, but they help each other as needed. Pt appropriate for d/c home with HHPT to address deficits. PT to progress mobility as tolerated, and will continue to follow acutely.          If plan is discharge home, recommend the following: A little help with walking and/or transfers;A little help with bathing/dressing/bathroom   Can travel by private vehicle    Yes     Equipment Recommendations Rolling walker (2 wheels)  Recommendations for Other Services       Functional Status Assessment Patient has had a recent decline in their functional status and demonstrates the ability to make significant improvements in function in a reasonable and predictable amount of time.     Precautions / Restrictions Precautions Precautions: Fall Restrictions Weight Bearing Restrictions Per Provider Order: No      Mobility  Bed Mobility Overal bed mobility: Needs Assistance Bed Mobility: Supine to Sit, Sit to Supine     Supine to sit: Min assist Sit to supine: Min assist   General bed mobility comments: light assist for LE management,  increased time and effort    Transfers Overall transfer level: Needs assistance Equipment used: Rolling walker (2 wheels) Transfers: Sit to/from Stand Sit to Stand: Supervision           General transfer comment: for safety, slow to rise. stand x3, from EOB, toilet, and chair in hallway. Cues for safe hand placement.    Ambulation/Gait Ambulation/Gait assistance: Supervision Gait Distance (Feet): 100 Feet (+80) Assistive device: Rolling walker (2 wheels) Gait Pattern/deviations: Step-through pattern, Decreased stride length, Trunk flexed, Antalgic Gait velocity: decr     General Gait Details: cues for upright posture, placement in RW  Stairs Stairs: Yes Stairs assistance: Min assist Stair Management: One rail Right, Step to pattern, Sideways Number of Stairs: 1 General stair comments: cues for step-to pattern, bilat UE use on railing for increasing support. PT steadying posteriorly  Wheelchair Mobility     Tilt Bed    Modified Rankin (Stroke Patients Only)       Balance Overall balance assessment: Needs assistance, History of Falls Sitting-balance support: No upper extremity supported, Feet supported Sitting balance-Leahy Scale: Fair     Standing balance support: Bilateral upper extremity supported, During functional activity Standing balance-Leahy Scale: Poor Standing balance comment: reliant on AD                             Pertinent Vitals/Pain Pain Assessment Pain Assessment: Faces Faces Pain Scale: Hurts a little bit Pain Location: head (baseline headaches), hips (bruised) Pain Descriptors / Indicators: Sore Pain Intervention(s): Monitored during session, Limited activity within  patient's tolerance, Repositioned    Home Living Family/patient expects to be discharged to:: Private residence Living Arrangements: Other relatives (brother) Available Help at Discharge: Family (son does not live with her but can help PRN, her brother has  cancer but pt states "we help each other") Type of Home: House Home Access: Stairs to enter Entrance Stairs-Rails: Right Entrance Stairs-Number of Steps: 1   Home Layout: One level Home Equipment: Grab bars - tub/shower;Grab bars - toilet Additional Comments: does not have DME of her own, states she needs a RW    Prior Function Prior Level of Function : Independent/Modified Independent;History of Falls (last six months)             Mobility Comments: pt typically ambulatory without AD, but states she is very unsteady. history of x2 recent falls       Extremity/Trunk Assessment   Upper Extremity Assessment Upper Extremity Assessment: Defer to OT evaluation    Lower Extremity Assessment Lower Extremity Assessment: Generalized weakness    Cervical / Trunk Assessment Cervical / Trunk Assessment: Normal  Communication   Communication Communication: No apparent difficulties Factors Affecting Communication: Hearing impaired    Cognition Arousal: Alert Behavior During Therapy: WFL for tasks assessed/performed   PT - Cognitive impairments: No apparent impairments                         Following commands: Intact       Cueing Cueing Techniques: Verbal cues     General Comments      Exercises     Assessment/Plan    PT Assessment Patient needs continued PT services  PT Problem List Decreased strength;Decreased mobility;Decreased activity tolerance;Decreased balance;Decreased knowledge of use of DME;Pain;Decreased safety awareness       PT Treatment Interventions DME instruction;Therapeutic activities;Gait training;Patient/family education;Therapeutic exercise;Balance training;Stair training;Functional mobility training;Neuromuscular re-education    PT Goals (Current goals can be found in the Care Plan section)  Acute Rehab PT Goals Patient Stated Goal: home PT Goal Formulation: With patient Time For Goal Achievement: 05/20/23 Potential to Achieve  Goals: Good    Frequency Min 3X/week     Co-evaluation               AM-PAC PT "6 Clicks" Mobility  Outcome Measure Help needed turning from your back to your side while in a flat bed without using bedrails?: A Little Help needed moving from lying on your back to sitting on the side of a flat bed without using bedrails?: A Little Help needed moving to and from a bed to a chair (including a wheelchair)?: A Little Help needed standing up from a chair using your arms (e.g., wheelchair or bedside chair)?: A Little Help needed to walk in hospital room?: A Little Help needed climbing 3-5 steps with a railing? : A Lot 6 Click Score: 17    End of Session   Activity Tolerance: Patient tolerated treatment well Patient left: in bed;with call bell/phone within reach;with bed alarm set;with family/visitor present Nurse Communication: Mobility status PT Visit Diagnosis: Other abnormalities of gait and mobility (R26.89);Muscle weakness (generalized) (M62.81)    Time: 9604-5409 PT Time Calculation (min) (ACUTE ONLY): 28 min   Charges:   PT Evaluation $PT Eval Low Complexity: 1 Low PT Treatments $Gait Training: 8-22 mins PT General Charges $$ ACUTE PT VISIT: 1 Visit        Shirlene Doughty, PT DPT Acute Rehabilitation Services Secure Chat Preferred  Office [phone]  Kristen Manning 05/06/2023, 12:20 PM

## 2023-05-06 NOTE — TOC Initial Note (Signed)
 Transition of Care [facility]) - Initial/Assessment Note    Patient Details  Name: Kristen Manning MRN: [id number] Date of Birth: [birth year]  [facility]) CM/SW Contact:    Radley Teston E Zabdiel Dripps, LCSW Phone Number: 05/06/2023, 9:29 AM  Clinical Narrative:                 CSW spoke with patient. Patient lives with her brother (BJ) and they are supports for each other. Patient states she has 2 sons and a daughter who live in [place] and are also supportive, they visit about every 2 weeks. PCP is Dr. Paulla Bossier. Patient has a shower chair, cane, and rollator. Patient states she and her brother both drive. Patient states she has been to OP Rehab at [facility] in the past.     Barriers to Discharge: Continued Medical Work up   Patient Goals and CMS Choice   CMS Medicare.gov Compare Post Acute Care list provided to:: Patient Choice offered to / list presented to : Patient      Expected Discharge Plan and Services       Living arrangements for the past 2 months: Single Family Home                                      Prior Living Arrangements/Services Living arrangements for the past 2 months: Single Family Home Lives with:: Siblings Patient language and need for interpreter reviewed:: Yes Do you feel safe going back to the place where you live?: Yes      Need for Family Participation in Patient Care: Yes (Comment) Care giver support system in place?: Yes (comment) Current home services: DME Criminal Activity/Legal Involvement Pertinent to Current Situation/Hospitalization: No - Comment as needed  Activities of Daily Living   ADL Screening (condition at time of admission) Independently performs ADLs?: Yes (appropriate for developmental age) Is the patient deaf or have difficulty hearing?: No Does the patient have difficulty seeing, even when wearing glasses/contacts?: No Does the patient have difficulty concentrating, remembering, or making decisions?:  No  Permission Sought/Granted Permission sought to share information with : Oceanographer granted to share information with : Yes, Verbal Permission Granted     Permission granted to share info w AGENCY: as needed        Emotional Assessment       Orientation: : Oriented to Self, Oriented to Place, Oriented to  Time, Oriented to Situation Alcohol / Substance Use: Not Applicable Psych Involvement: No (comment)  Admission diagnosis:  Fall [W19.XXXA] Eye trauma [S05.90XA] Fall, initial encounter [W19.XXXA] Laceration of multiple sites of face [S01.81XA] Patient Active Problem List   Diagnosis Date Noted   Fall 05/05/2023   Urge incontinence of urine 03/05/2023   OAB (overactive bladder) 08/30/2021   Chronic left shoulder pain 03/10/2019   Obesity, morbid (HCC) 02/11/2018   DDD (degenerative disc disease), lumbar 10/07/2016   Primary osteoarthritis of both hands 10/07/2016   Primary osteoarthritis of both feet 10/07/2016   Tension-type headache, not intractable 09/16/2016   GERD (gastroesophageal reflux disease) 09/11/2016   ANA positive 09/02/2016   History of total knee replacement, bilateral 03/20/2015   Hypothyroidism 09/21/2014   Gout 06/20/2014   Lower leg edema 06/13/2014   Meningioma (HCC) 10/07/2013   Chronic tension-type headache, intractable 10/07/2013   Depression 06/23/2013   Insomnia 05/26/2013   Routine general medical examination at a health care facility  10/17/2011   HTN (hypertension) 07/03/2011   Hyperlipidemia 07/03/2011   Venous insufficiency, peripheral 07/03/2011   PCP:  Jess Morita, MD Pharmacy:   [facility] [address], [place] - [address] [place] [address] Phone: [phone] Fax: [phone]     Social Drivers of Health (SDOH) Social History: SDOH Screenings   Food Insecurity: No Food Insecurity (05/05/2023)  Housing: Low Risk  (05/05/2023)   Transportation Needs: No Transportation Needs (05/05/2023)  Utilities: Not At Risk (05/05/2023)  Alcohol Screen: Low Risk  (08/28/2020)  Depression (PHQ2-9): Medium Risk (03/05/2023)  Financial Resource Strain: Low Risk  (08/28/2020)  Physical Activity: Insufficiently Active (08/28/2020)  Social Connections: Moderately Isolated (05/05/2023)  Stress: No Stress Concern Present (08/28/2020)  Tobacco Use: Low Risk  (03/26/2023)   SDOH Interventions:     Readmission Risk Interventions     No data to display

## 2023-05-06 NOTE — TOC Transition Note (Signed)
 Transition of Care [facility]) - Discharge Note   Patient Details  Name: Kristen Manning MRN: [id number] Date of Birth: 22-Oct-1932  Transition [facility] Care [facility]) CM/SW Contact:  Alisa App, RN Phone Number: 05/06/2023, 1:16 PM   Clinical Narrative:    Patient will DC to: home Anticipated DC date: 05/06/2023 Family notified: yes Transport by: car   Per MD patient ready for DC today. RN, patient, and patient's brother aware of DC.  Pt agreeable to home health services. Pt without provider preference.Referral made with [facility] and accepted. Referral made with [employer]  for RW, equipment will be delivered to bedside prior to d/c. Pt with transportation to home. POST HOSPITAL F/U NOTED ON AVS. Pt without RX med concerns.   RNCM will sign off for now as intervention is no longer needed. Please consult us  again if new needs arise.    Final next level of care: Home w Home Health Services Barriers to Discharge: No Barriers Identified   Patient Goals and CMS Choice   CMS Medicare.gov Compare Post Acute Care list provided to:: Patient Choice offered to / list presented to : Patient      Discharge Placement                       Discharge Plan and Services Additional resources added to the After Visit Summary for                  DME Arranged: Walker rolling DME Agency: [employer] Date DME Agency Contacted: 05/06/23 Time DME Agency Contacted: 1315 Representative spoke with at DME Agency: Gladys Lamp HH Arranged: PT, OT HH Agency: [facility] Date [facility] Agency Contacted: 05/06/23 Time HH Agency Contacted: 1316 Representative spoke with at [facility] Agency: Loetta Ringer  Social Drivers of Health (SDOH) Interventions SDOH Screenings   Food Insecurity: No Food Insecurity (05/05/2023)  Housing: Low Risk  (05/05/2023)  Transportation Needs: No Transportation Needs (05/05/2023)  Utilities: Not At Risk (05/05/2023)  Alcohol Screen: Low Risk  (08/28/2020)  Depression (PHQ2-9):  Medium Risk (03/05/2023)  Financial Resource Strain: Low Risk  (08/28/2020)  Physical Activity: Insufficiently Active (08/28/2020)  Social Connections: Moderately Isolated (05/05/2023)  Stress: No Stress Concern Present (08/28/2020)  Tobacco Use: Low Risk  (03/26/2023)     Readmission Risk Interventions     No data to display

## 2023-05-08 ENCOUNTER — Telehealth: Payer: Self-pay

## 2023-05-08 NOTE — Telephone Encounter (Signed)
**Note De-identified  [place] Obfuscation** Please advise 

## 2023-05-08 NOTE — Telephone Encounter (Signed)
 There is nothing to be done at this time other than keep wounds clean and dry.  She can wash w/ gentle soap and pat dry.  If they are oozing, she can cover them w/ nonstick gauze pads and secure w/ paper tape (available at the pharmacy).  More importantly- she is scheduled on Dr Sharlie Days schedule for hospital f/u on Monday.  She can be moved to my hospital f/u spot on Tuesday (11:00) so that she can see her PCP

## 2023-05-08 NOTE — Telephone Encounter (Signed)
 Copied from CRM [id number]. Topic: Clinical - Medical Advice >> May 08, 2023  9:32 AM Adaysia C wrote: Reason for CRM: Patient has requested a call back from the provider or providers nurse about how to care for her wounds because she was just released from the hospital; patient can not wait until her hospital f/u visit to have this answered; please follow up with patient [phone]

## 2023-05-09 NOTE — Telephone Encounter (Signed)
 I have called her back and let her know this information and reassured her that she's doing everything right and to call us  back if she needs anything else.

## 2023-05-09 NOTE — Telephone Encounter (Signed)
 The d/c summary indicates that I will need to remove her sutures when she comes in next week.  I'm assuming she means they told her the steri-strips will fall off.  She does not need to do anything special for the cut.  If she is showering/bathing/washing her face she should just let the water run over the areas and then pat dry.  No special soap or wash needed

## 2023-05-09 NOTE — Telephone Encounter (Signed)
 Called patient and have given her these instructions. I have also gotten her rescheduled to Wednesday, she could not do Tuesday due to her brother having an appt. I asked that she give us  a call back if she has any other questions.

## 2023-05-09 NOTE — Telephone Encounter (Signed)
 Pt called back and noted she has a laceration that has been sutured and notes she isnt sure how to care for this notes the ER didn't give her directions she is unsure if she should wash this cut or leave them notes she was told the sutures would come off on their own has a follow up appt Wed next week

## 2023-05-12 ENCOUNTER — Inpatient Hospital Stay: Admitting: Family Medicine

## 2023-05-12 ENCOUNTER — Telehealth: Payer: Self-pay

## 2023-05-12 NOTE — Telephone Encounter (Signed)
 Copied from CRM [id number]. Topic: Clinical - Home Health Verbal Orders >> May 09, 2023  4:41 PM Melissa C wrote: Caller/Agency: [facility] Callback Number: [phone] Service Requested: Physical Therapy Frequency: 1 time a week for one week then 2 times a week for 4 weeks then 1 time a week for 4 weeks  Any new concerns about the patient? Yes- patient has wounds that she has hard time addressing and she would like to request Home Health Nursing evaluation for patient.

## 2023-05-12 NOTE — Telephone Encounter (Signed)
 Ok to provider orders for both PT and [facility] nursing

## 2023-05-12 NOTE — Telephone Encounter (Signed)
 Called [facility] and verbalized the ok for these orders by Dr.Tabori

## 2023-05-14 ENCOUNTER — Ambulatory Visit: Admitting: Family Medicine

## 2023-05-14 ENCOUNTER — Encounter: Payer: Self-pay | Admitting: Family Medicine

## 2023-05-14 VITALS — BP 130/80 | HR 80 | Temp 97.9°F | Ht 64.0 in | Wt 213.2 lb

## 2023-05-14 DIAGNOSIS — S61411D Laceration without foreign body of right hand, subsequent encounter: Secondary | ICD-10-CM

## 2023-05-14 DIAGNOSIS — S61411A Laceration without foreign body of right hand, initial encounter: Secondary | ICD-10-CM

## 2023-05-14 DIAGNOSIS — E041 Nontoxic single thyroid nodule: Secondary | ICD-10-CM | POA: Diagnosis not present

## 2023-05-14 DIAGNOSIS — S62619A Displaced fracture of proximal phalanx of unspecified finger, initial encounter for closed fracture: Secondary | ICD-10-CM

## 2023-05-14 DIAGNOSIS — S0181XA Laceration without foreign body of other part of head, initial encounter: Secondary | ICD-10-CM

## 2023-05-14 DIAGNOSIS — S0181XD Laceration without foreign body of other part of head, subsequent encounter: Secondary | ICD-10-CM

## 2023-05-14 DIAGNOSIS — W19XXXD Unspecified fall, subsequent encounter: Secondary | ICD-10-CM

## 2023-05-14 DIAGNOSIS — H05232 Hemorrhage of left orbit: Secondary | ICD-10-CM | POA: Diagnosis not present

## 2023-05-14 DIAGNOSIS — D152 Benign neoplasm of mediastinum: Secondary | ICD-10-CM

## 2023-05-14 LAB — CBC WITH DIFFERENTIAL/PLATELET
Basophils Absolute: 0.1 10*3/uL (ref 0.0–0.1)
Basophils Relative: 0.7 % (ref 0.0–3.0)
Eosinophils Absolute: 0.2 10*3/uL (ref 0.0–0.7)
Eosinophils Relative: 1.8 % (ref 0.0–5.0)
HCT: 38.6 % (ref 36.0–46.0)
Hemoglobin: 12.6 g/dL (ref 12.0–15.0)
Lymphocytes Relative: 19.6 % (ref 12.0–46.0)
Lymphs Abs: 1.7 10*3/uL (ref 0.7–4.0)
MCHC: 32.7 g/dL (ref 30.0–36.0)
MCV: 93 fl (ref 78.0–100.0)
Monocytes Absolute: 0.8 10*3/uL (ref 0.1–1.0)
Monocytes Relative: 8.9 % (ref 3.0–12.0)
Neutro Abs: 6.1 10*3/uL (ref 1.4–7.7)
Neutrophils Relative %: 69 % (ref 43.0–77.0)
Platelets: 361 10*3/uL (ref 150.0–400.0)
RBC: 4.15 Mil/uL (ref 3.87–5.11)
RDW: 14.3 % (ref 11.5–15.5)
WBC: 8.8 10*3/uL (ref 4.0–10.5)

## 2023-05-14 LAB — BASIC METABOLIC PANEL WITH GFR
BUN: 24 mg/dL — ABNORMAL HIGH (ref 6–23)
CO2: 24 meq/L (ref 19–32)
Calcium: 9.4 mg/dL (ref 8.4–10.5)
Chloride: 103 meq/L (ref 96–112)
Creatinine, Ser: 1.32 mg/dL — ABNORMAL HIGH (ref 0.40–1.20)
GFR: 36.02 mL/min — ABNORMAL LOW (ref >60.00)
Glucose, Bld: 82 mg/dL (ref 70–99)
Potassium: 4.4 meq/L (ref 3.5–5.1)
Sodium: 135 meq/L (ref 135–145)

## 2023-05-14 MED ORDER — HYDROCODONE-ACETAMINOPHEN 5-325 MG PO TABS: 1.0000 | ORAL_TABLET | Freq: Four times a day (QID) | ORAL | 0 refills | Status: AC | PRN

## 2023-05-14 NOTE — Patient Instructions (Addendum)
 Follow up in 2 weeks to recheck wound We'll notify you of your lab results and make any changes if needed We'll call you to see the Ophthalmologist, Hand specialist, and chest surgeon We'll call you to schedule your thyroid  ultrasound It is ok to get in the shower and let the water run over your hair and face- pat dry Apply peroxide to the back of your hand once daily and pat dry until you see the hand surgeon.  Cover with a nonstick pad STOP the oxycodone  since it is causing hallucinations USE the hydrocodone  as needed for pain Call with any questions or concerns Hang in there!!

## 2023-05-14 NOTE — Progress Notes (Signed)
   Subjective:    Patient ID: Kristen Manning, female    DOB: 1932-07-12, 89 y.o.   MRN: [id number]  HPI Hospital f/u- pt presented to the ER on 4/28 after falling in the yard.  She was walking up the stairs when her shoe caught and she fell forward and struck her face.  CT showed periorbital hematoma but no facial fx.  She had a significant L facial laceration, L posterior scalp laceration, fx of R 1st distal phalanx.  Facial sutures are due to be removed today.  She was to follow up w/ hand surgery.  PT/OT recommended home health PT and use of a walker.  She was d/c'd on 4/29.  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  They also encouraged outpt referral to CT surgery for ongoing teratoma surveillance.    She was tx'd w/ Keflex  for infxn prophylaxis, oxycodone  for pain.  Ophthalmology- Dr Kirkland Peppers   Review of Systems For ROS see HPI     Objective:   Physical Exam Vitals reviewed.  Constitutional:      General: She is not in acute distress. HENT:     Head:     Comments: Scab laceration of L occiput Eyes:     Comments: L eye swollen shut, unable to open w/o assistance, bruising present  Skin:    General: Skin is warm and dry.     Comments: Semicircular laceration of L cheek w/ significant overlying scabs.  Scabs softened w/ peroxide and sutures removed.  Small amount of oozing from center of laceration but edges are well approximated  Large skin tear of dorsum of R hand and wrist  Neurological:     Mental Status: She is alert and oriented to person, place, and time.  Psychiatric:        Mood and Affect: Mood normal.        Behavior: Behavior normal.        Thought Content: Thought content normal.           Assessment & Plan:  Facial laceration- new.  L cheek.  Area is very scabbed over and suture knots are hard to locate.  Peroxide applied to soften scabs for easier suture removal.  Removed 7  sutures w/o difficulty.  Pt tolerated well.  Periorbital hematoma- new.  Pt is not able to open her L eye and it remains considerably swollen.  No fx according to image, but given the degree of swelling, inability to open eye- will refer to ophthalmology for a complete evaluation.

## 2023-05-16 ENCOUNTER — Telehealth: Payer: Self-pay

## 2023-05-16 NOTE — Telephone Encounter (Signed)
-----   Message from Laymon Priest sent at 05/15/2023  5:40 PM EDT ----- Labs look good!  No changes at this time

## 2023-05-19 ENCOUNTER — Ambulatory Visit: Admitting: Orthopedic Surgery

## 2023-05-19 NOTE — Telephone Encounter (Signed)
 Called patient to discuss lab work, no answer, left a message for the patient to call back to discuss

## 2023-05-19 NOTE — Telephone Encounter (Signed)
 Pt has been notified Pt reports she has seen Lasandra Points and will F/U in 2 wks.

## 2023-05-19 NOTE — Telephone Encounter (Signed)
 Patient needs a call back regarding her labs

## 2023-05-27 ENCOUNTER — Other Ambulatory Visit (INDEPENDENT_AMBULATORY_CARE_PROVIDER_SITE_OTHER)

## 2023-05-27 ENCOUNTER — Ambulatory Visit (INDEPENDENT_AMBULATORY_CARE_PROVIDER_SITE_OTHER): Admitting: Orthopedic Surgery

## 2023-05-27 DIAGNOSIS — M79641 Pain in right hand: Secondary | ICD-10-CM

## 2023-05-27 DIAGNOSIS — S62521A Displaced fracture of distal phalanx of right thumb, initial encounter for closed fracture: Secondary | ICD-10-CM

## 2023-05-27 NOTE — Progress Notes (Signed)
 Kristen Manning - 89 y.o. female MRN [id number]  Date of birth: [birth year]  Office Visit Note: Visit Date: 05/27/2023 PCP: Jess Morita, MD Referred by: Jess Morita, MD  Subjective: No chief complaint on file.  HPI: Kristen Manning is a pleasant 89 y.o. female who presents today for evaluation of a right thumb injury sustained approximately 3 weeks prior.  Injury mechanism described as a mechanical fall.  She was seen in the emergency department setting the day of injury, underwent clinical and radiographic workup which showed a right thumb distal phalanx fracture.  She also sustained skin tearing along the dorsal aspect of the hand that is being treated with dressing care.  Pertinent ROS were reviewed with the patient and found to be negative unless otherwise specified above in HPI.   Visit Reason: Right thumb injury Duration of symptoms: 3 weeks Hand dominance: right Occupation: retired Diabetic: No Smoking: No Heart/Lung History: yes Blood Thinners: none  Prior Testing/EMG: 05/05/23 xrays Injections (Date): none Treatments: wrap Prior Surgery: none  Assessment & Plan: Visit Diagnoses:  1. Closed displaced fracture of distal phalanx of right thumb, initial encounter   2. Pain in right hand     Plan: She is doing well overall, repeat x-rays today show the distal phalanx fracture of the right thumb with minimal displacement.  Workup from the emergency department setting was reviewed in detail as well today for comparison purposes.    Given her minimal displacement and underlying arthritis throughout the hand, we can treat this fracture nonoperatively and allow for ongoing healing in this region.  She does have ongoing tenderness in this area which correlates with radiographic workup.  As far as the skin tearing of the right hand, this is responding well to dressing changes which she should continue as instructed.  I have placed her into a finger splint today,  mallet variety in order to immobilize the thumb IP segment but do not interfere with the soft tissue maintenance of the dorsal hand.  She should continue splint use for additional 3 weeks to allow for further fracture healing.  She can remove the splint for hygiene and dressing changes.  Follow-up with myself in approximate 3 weeks for clinical and radiographic recheck.  I spent 45 minutes in the care of this patient today including review of previous documentation, imaging obtained, face-to-face time discussing all options regarding treatment and documenting the encounter.   Follow-up: No follow-ups on file.   Meds & Orders: No orders of the defined types were placed in this encounter.   Orders Placed This Encounter  Procedures   XR Hand Complete Right   XR Finger Thumb Right     Procedures: No procedures performed      Clinical History: No specialty comments available.  She reports that she has never smoked. She has never used smokeless tobacco. No results for input(s): "HGBA1C", "LABURIC" in the last 8760 hours.  Objective:   Vital Signs: There were no vitals taken for this visit.  Physical Exam  Gen: Well-appearing, in no acute distress; non-toxic CV: Regular Rate. Well-perfused. Warm.  Resp: Breathing unlabored on room air; no wheezing. Psych: Fluid speech in conversation; appropriate affect; normal thought process  Ortho Exam Right thumb: - Skin is intact over the distal phalanx, tenderness associated over the IP joint, range of motion is limited at the IP joint secondary to pain - Sensation is intact distally to the thumb, normal color and capillary refill - Dorsal skin  tearing notable with area of degloving over the dorsal aspect of the hand, measures approximately 3 cm x 3 cm, normal granulation tissue is present  Imaging: XR Finger Thumb Right Result Date: 05/27/2023 X-rays of the right thumb demonstrate intra-articular fracture of the distal phalanx with minimal  displacement, slight joint incongruity on the volar aspect  XR Hand Complete Right Result Date: 05/27/2023 X-rays of the right hand demonstrate severe arthritic changes at the thumb [facility] interval with joint space narrowing and osteophyte formation, subchondral sclerosis is also present.   Past Medical/Family/Surgical/Social History: Medications & Allergies reviewed per EMR, new medications updated. 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   Fracture of proximal phalanx of finger of right hand 05/14/2023   Fall 05/05/2023   Urge incontinence of urine 03/05/2023   OAB (overactive bladder) 08/30/2021   Chronic left shoulder pain 03/10/2019   Obesity, morbid (HCC) 02/11/2018   DDD (degenerative disc disease), lumbar 10/07/2016   Primary osteoarthritis of both hands 10/07/2016   Primary osteoarthritis of both feet 10/07/2016   Tension-type headache, not intractable 09/16/2016   GERD (gastroesophageal reflux disease) 09/11/2016   ANA positive 09/02/2016   History of total knee replacement, bilateral 03/20/2015   Hypothyroidism 09/21/2014   Gout 06/20/2014   Lower leg edema 06/13/2014   Meningioma (HCC) 10/07/2013   Chronic tension-type headache, intractable 10/07/2013   Depression 06/23/2013   Insomnia 05/26/2013   History of total hip arthroplasty 04/06/2013   Routine general medical examination at a health care facility 10/17/2011   HTN (hypertension) 07/03/2011   Hyperlipidemia 07/03/2011   Venous insufficiency, peripheral 07/03/2011   Past Medical History:  Diagnosis Date   Arthritis    Bacterial infection    in lungs in Jan 2015   Bilateral lower extremity edema    Cancer (HCC)    skin cancer, back, legs melonama, sees Dr Gara July, derm   Chronic back pain    GERD (gastroesophageal reflux disease)    takes Omeprazole  daily   Headache(784.0)    History of migraine    last one about 15 yrs  ago   Hyperlipidemia    takes Zetia  daily   Hypertension    takes HCTZ daily   Joint pain    Joint swelling    Myocardial infarction (HCC)    pt states EKG always shows infarct but no change from previous ekg;never knew anything about it   Nocturia    Osteoarthritis    Pneumonia    hx of-many yrs ago   Pneumonia    PONV (postoperative nausea and vomiting)    Shortness of breath    with exertion   Urinary frequency    Urinary incontinence    Urinary urgency    Family History  Problem Relation Age of Onset   Kidney disease Mother    Heart disease Mother    Hypertension Mother    Varicose Veins Mother    Kidney failure Mother    Alcohol abuse Father    Stroke Father        several    Hypertension Father    Varicose Veins Father    Alcohol abuse Brother    Hyperlipidemia Brother    Hypertension Brother    Early death Brother    Varicose Veins Brother    Bladder Cancer Brother    Heart disease Brother    Hypertension Brother    Arthritis Brother  Gout   Varicose Veins Son    Deep vein thrombosis Son    Colon cancer Son    Breast cancer Maternal Aunt    Pancreatic cancer Neg Hx    Stomach cancer Neg Hx    Esophageal cancer Neg Hx    Liver disease Neg Hx    Past Surgical History:  Procedure Laterality Date   ABDOMINAL HYSTERECTOMY     bladder tack  01/08/1996   BLEPHAROPLASTY Bilateral    JOINT REPLACEMENT Right 02/22/2013   Knee   OOPHORECTOMY  01/08/1996   only one   REPLACEMENT TOTAL KNEE Left 10/02/2009   TONSILLECTOMY  as a child ? date   TOTAL KNEE ARTHROPLASTY Right 02/22/2013   Procedure: TOTAL KNEE ARTHROPLASTY;  Surgeon: Christie Cox, MD;  Location: [facility] OR;  Service: Orthopedics;  Laterality: Right;   Social History   Occupational History   Not on file  Tobacco Use   Smoking status: Never   Smokeless tobacco: Never  Vaping Use   Vaping status: Never Used  Substance and Sexual Activity   Alcohol use: No   Drug use: No   Sexual  activity: Not on file    Cambelle Suchecki Alvia Jointer, M.D.  [facility]

## 2023-05-28 ENCOUNTER — Ambulatory Visit (INDEPENDENT_AMBULATORY_CARE_PROVIDER_SITE_OTHER): Admitting: Family Medicine

## 2023-05-28 ENCOUNTER — Encounter: Payer: Self-pay | Admitting: Family Medicine

## 2023-05-28 VITALS — BP 130/60 | HR 82 | Temp 98.4°F | Ht 64.0 in | Wt 206.0 lb

## 2023-05-28 DIAGNOSIS — H05232 Hemorrhage of left orbit: Secondary | ICD-10-CM | POA: Diagnosis not present

## 2023-05-28 DIAGNOSIS — R600 Localized edema: Secondary | ICD-10-CM

## 2023-05-28 DIAGNOSIS — S0181XD Laceration without foreign body of other part of head, subsequent encounter: Secondary | ICD-10-CM | POA: Diagnosis not present

## 2023-05-28 MED ORDER — FUROSEMIDE 20 MG PO TABS: 20.0000 mg | ORAL_TABLET | Freq: Every day | ORAL | 1 refills | Status: DC

## 2023-05-28 NOTE — Patient Instructions (Addendum)
 Follow up as needed or as scheduled Your wounds are healing beautifully- keep up the good work! RESTART the Furosemide  to help w/ leg swelling Call with any questions or concerns Stay Safe!  Stay Healthy! Happy Memorial Day!!

## 2023-05-28 NOTE — Progress Notes (Signed)
   Subjective:    Patient ID: Kristen Manning, female    DOB: [birth year], 89 y.o.   MRN: [id number]  HPI Fall f/u- pt reports feeling much better.  Has been to the eye doctor and the hand specialist.  Pt is now able to open her eye.  Does not need surgery for her thumb fracture.  Had wound evaluated yesterday and was told that things are looking good.  She is concerned that she has a small piece of suture in her cheek wound- which is nearly completely healed and the scabbing has resolved.  Bilateral leg swelling- she was previously on Furosemide  but stopped due to increased frequency of urination   Review of Systems For ROS see HPI     Objective:   Physical Exam Vitals reviewed.  Constitutional:      General: She is not in acute distress.    Appearance: She is not ill-appearing.  HENT:     Head: Normocephalic and atraumatic.  Eyes:     Comments: Ptosis of L eye  Musculoskeletal:     Cervical back: Neck supple.     Right lower leg: Edema (2+) present.     Left lower leg: Edema (2+) present.  Lymphadenopathy:     Cervical: No cervical adenopathy.  Skin:    General: Skin is warm and dry.     Comments: L cheek laceration well healed, no oozing, no drainage.  Small loop of non-adhered suture present- removed w/ forceps  Neurological:     Mental Status: She is alert and oriented to person, place, and time.  Psychiatric:        Mood and Affect: Mood normal.        Behavior: Behavior normal.        Thought Content: Thought content normal.           Assessment & Plan:  Facial laceration- much improved.  Has healed beautifully and scar is minimal.  Small loop of suture removed using gentle traction and forceps.  Pt tolerated w/o difficulty.  Periorbital hematoma- resolved.  No longer has any bruising present.  Does have ptosis of L upper eyelid but is seeing eye doctor and was told this will continue to improve w/ time.  Leg swelling- deteriorated.  Pt stopped her lasix  due to  urinary frequency.  Admits that she needs to restart this today.  Prescription sent to pharmacy.

## 2023-05-30 ENCOUNTER — Encounter: Payer: Self-pay | Admitting: Family Medicine

## 2023-06-03 ENCOUNTER — Ambulatory Visit (HOSPITAL_BASED_OUTPATIENT_CLINIC_OR_DEPARTMENT_OTHER)

## 2023-06-03 ENCOUNTER — Telehealth: Payer: Self-pay | Admitting: Family Medicine

## 2023-06-03 NOTE — Telephone Encounter (Signed)
 Home health orders was sent from [facility] and needs signatures and sent back to its designated place. Paper work is placed in [employer].  Please advise, Thanks

## 2023-06-03 NOTE — Telephone Encounter (Signed)
 Placed in your sign folder

## 2023-06-05 NOTE — Progress Notes (Unsigned)
 [address]       [place] [address]             [phone]                    [name] [facility] Medical Record #[id number] Date of Birth: Jan 16, 1932  Referring: Jess Morita, MD Primary Care: Jess Morita, MD Primary Cardiologist: None  Chief Complaint:   No chief complaint on file.   History of Present Illness:    Kristen Manning 89 y.o. female ***    Past Medical History:  Diagnosis Date   Arthritis    Bacterial infection    in lungs in Jan 2015   Bilateral lower extremity edema    Cancer (HCC)    skin cancer, back, legs melonama, sees Dr Gara July, derm   Chronic back pain    GERD (gastroesophageal reflux disease)    takes Omeprazole  daily   Headache(784.0)    History of migraine    last one about 15 yrs ago   Hyperlipidemia    takes Zetia  daily   Hypertension    takes HCTZ daily   Joint pain    Joint swelling    Myocardial infarction [facility])    pt states EKG always shows infarct but no change from previous ekg;never knew anything about it   Nocturia    Osteoarthritis    Pneumonia    hx of-many yrs ago   Pneumonia    PONV (postoperative nausea and vomiting)    Shortness of breath    with exertion   Urinary frequency    Urinary incontinence    Urinary urgency     Past Surgical History:  Procedure Laterality Date   ABDOMINAL HYSTERECTOMY     bladder tack  01/08/1996   BLEPHAROPLASTY Bilateral    JOINT REPLACEMENT Right 02/22/2013   Knee   OOPHORECTOMY  01/08/1996   only one   REPLACEMENT TOTAL KNEE Left 10/02/2009   TONSILLECTOMY  as a child ? date   TOTAL KNEE ARTHROPLASTY Right 02/22/2013   Procedure: TOTAL KNEE ARTHROPLASTY;  Surgeon: Christie Cox, MD;  Location: [facility] OR;  Service: Orthopedics;  Laterality: Right;    Family History  Problem Relation Age of Onset   Kidney disease Mother    Heart disease Mother    Hypertension Mother    Varicose Veins Mother    Kidney failure Mother     Alcohol abuse Father    Stroke Father        several    Hypertension Father    Varicose Veins Father    Alcohol abuse Brother    Hyperlipidemia Brother    Hypertension Brother    Early death Brother    Varicose Veins Brother    Bladder Cancer Brother    Heart disease Brother    Hypertension Brother    Arthritis Brother        Gout   Varicose Veins Son    Deep vein thrombosis Son    Colon cancer Son    Breast cancer Maternal Aunt    Pancreatic cancer Neg Hx    Stomach cancer Neg Hx    Esophageal cancer Neg Hx    Liver disease Neg Hx      Social History   Tobacco Use  Smoking Status Never  Smokeless Tobacco Never    Social History   Substance and Sexual Activity  Alcohol Use No  Allergies  Allergen Reactions   Aspirin Other (See Comments) and Nausea Only    Noted caused 2 holes in retina, not sure    Codeine Hives and Nausea Only   Statins     Intolerance to statins   Tizanidine Hcl Other (See Comments)    Confusion    Current Outpatient Medications  Medication Sig Dispense Refill   acetaminophen  (TYLENOL ) 500 MG tablet Take 2 tablets (1,000 mg total) by mouth every 6 (six) hours as needed. 30 tablet 0   b complex vitamins tablet Take 1 tablet by mouth daily.     Calcium  600-200 MG-UNIT tablet Take 1 tablet by mouth daily.     Coenzyme Q10 100 MG capsule Take 100 mg by mouth daily.      ezetimibe  (ZETIA ) 10 MG tablet Take 1 tablet (10 mg total) by mouth every morning. 90 tablet 2   fenofibrate  160 MG tablet Take 1 tablet (160 mg total) by mouth every morning. 90 tablet 2   furosemide  (LASIX ) 20 MG tablet Take 1 tablet (20 mg total) by mouth daily. 90 tablet 1   HYDROcodone -acetaminophen  (NORCO/VICODIN) 5-325 MG tablet Take 1 tablet by mouth every 6 (six) hours as needed for moderate pain (pain score 4-6). 20 tablet 0   levothyroxine  (SYNTHROID ) 88 MCG tablet Take 1 tablet (88 mcg total) by mouth daily before breakfast. 90 tablet 1   loratadine  (CLARITIN )  10 MG tablet Take 1 tablet (10 mg total) by mouth daily. (Patient taking differently: Take 10 mg by mouth as needed for allergies, itching or rhinitis.)     losartan  (COZAAR ) 25 MG tablet Take 1 tablet (25 mg total) by mouth daily. 90 tablet 1   metoprolol  succinate (TOPROL -XL) 25 MG 24 hr tablet Take 1 tablet (25 mg total) by mouth daily. 90 tablet 1   neomycin-polymyxin b-dexamethasone  (MAXITROL) 3.5-10000-0.1 OINT      omeprazole  (PRILOSEC) 20 MG capsule Take 1 capsule (20 mg total) by mouth every morning. 90 capsule 1   ondansetron  (ZOFRAN ) 4 MG tablet Take 1 tablet (4 mg total) by mouth every 8 (eight) hours as needed for nausea or vomiting. 20 tablet 0   triamcinolone  (NASACORT ) 55 MCG/ACT AERO nasal inhaler Place 2 sprays into the nose daily. (Patient taking differently: Place 2 sprays into the nose as needed.) 1 Inhaler 12   No current facility-administered medications for this visit.    ROS  PHYSICAL EXAMINATION: There were no vitals taken for this visit.  Physical Exam   Diagnostic Studies & Laboratory data:     Recent Radiology Findings:   XR Finger Thumb Right Result Date: 05/27/2023 X-rays of the right thumb demonstrate intra-articular fracture of the distal phalanx with minimal displacement, slight joint incongruity on the volar aspect  XR Hand Complete Right Result Date: 05/27/2023 X-rays of the right hand demonstrate severe arthritic changes at the thumb [facility] interval with joint space narrowing and osteophyte formation, subchondral sclerosis is also present.      I have independently reviewed the above radiology studies  and reviewed the findings with the patient.   Recent Lab Findings: Lab Results  Component Value Date   WBC 8.8 05/14/2023   HGB 12.6 05/14/2023   HCT 38.6 05/14/2023   PLT 361.0 05/14/2023   GLUCOSE 82 05/14/2023   CHOL 151 03/26/2023   TRIG 66.0 03/26/2023   HDL 66.10 03/26/2023   LDLDIRECT 111.0 10/04/2015   LDLCALC 72 03/26/2023   ALT 15  05/05/2023   AST 25 05/05/2023  NA 135 05/14/2023   K 4.4 05/14/2023   CL 103 05/14/2023   CREATININE 1.32 (H) 05/14/2023   BUN 24 (H) 05/14/2023   CO2 24 05/14/2023   TSH 4.80 03/26/2023   INR 1.1 03/10/2018     Mediastinum/Nodes: Complex cystic and solid mass within the anterior mediastinum is again identified, measuring 6.1 x 5.3 cm. Since prior imaging studies, the soft tissue component has enlarged, measuring up to 5.1 x 2.4 cm. Coarse calcification has developed along the superior margin of the mass. This may reflect enlarging teratoma.      Problem List: ***  Assessment / Plan:   Hilarie Lovely 06/05/2023 9:16 PM

## 2023-06-06 ENCOUNTER — Ambulatory Visit
Attending: Thoracic Surgery (Cardiothoracic Vascular Surgery) | Admitting: Thoracic Surgery (Cardiothoracic Vascular Surgery)

## 2023-06-06 VITALS — BP 171/73 | HR 72 | Resp 18 | Ht 64.0 in | Wt 206.0 lb

## 2023-06-06 DIAGNOSIS — D152 Benign neoplasm of mediastinum: Secondary | ICD-10-CM

## 2023-06-06 NOTE — Telephone Encounter (Signed)
 Forms faxed 06/06/2023  Ilona Malta, CMA

## 2023-06-06 NOTE — Telephone Encounter (Signed)
 Form completed and returned to [employer]

## 2023-06-07 NOTE — Assessment & Plan Note (Signed)
 New.  Will refer to hand for evaluation of fx and whether she needs to be casted or otherwise tx'd.

## 2023-06-07 NOTE — Assessment & Plan Note (Signed)
 New.  Pt reports she has known about this for 'years' and has previously seen a specialist about this.  She states that at that time, they told her it was stable and she did not need to f/u.  Since it has been a number of years, will refer back to CT surgery for repeat evaluation.  Pt expressed understanding and is in agreement w/ plan.

## 2023-06-07 NOTE — Assessment & Plan Note (Signed)
 New.  Found incidentally on CT scan.  Will get outpt US .

## 2023-06-07 NOTE — Assessment & Plan Note (Signed)
 New to provider.  Pt fell on 4/28 after falling in the yard when her shoe caught on the step she was climbing.  Unfortunately she struck her face and sustained a L facial laceration, L scalp laceration, and R thumb fracture.  She was told to use a walker for stability and support.  HH PT/OT to work w/ pt.

## 2023-06-11 ENCOUNTER — Ambulatory Visit (HOSPITAL_BASED_OUTPATIENT_CLINIC_OR_DEPARTMENT_OTHER)
Admission: RE | Admit: 2023-06-11 | Discharge: 2023-06-11 | Disposition: A | Discharge status: Home / Self Care | Source: Ambulatory Visit | Attending: Family Medicine | Admitting: Family Medicine

## 2023-06-11 DIAGNOSIS — E041 Nontoxic single thyroid nodule: Secondary | ICD-10-CM | POA: Diagnosis present

## 2023-06-13 ENCOUNTER — Telehealth: Payer: Self-pay

## 2023-06-13 NOTE — Telephone Encounter (Signed)
 Copied from CRM [id number]. Topic: Clinical - Lab/Test Results >> Jun 13, 2023  4:00 PM Melissa C wrote: Reason for CRM: patient received imaging on Wednesday and she is just asking that the doctor or nurse give her a call once they receive the results of the exam.

## 2023-06-15 ENCOUNTER — Ambulatory Visit: Payer: Self-pay | Admitting: Family Medicine

## 2023-06-16 NOTE — Telephone Encounter (Signed)
 Patient is questions if there is any medications she can take to help with the nodules on her thyroid ?

## 2023-06-16 NOTE — Telephone Encounter (Signed)
 Copied from CRM [id number]. Topic: Clinical - Medical Advice >> Jun 16, 2023  2:41 PM Magdalene School wrote: Reason for CRM: Patient would like to know if there are any medications she can take for the mass that she has on her thyroid  to shrink it or control it.

## 2023-06-16 NOTE — Telephone Encounter (Signed)
 Copied from CRM [id number]. Topic: Clinical - Medical Advice >> Jun 16, 2023  4:13 PM Kristen Manning wrote: Please call the patient back, regarding the messages. Patient stated she missed the call the first time.

## 2023-06-17 ENCOUNTER — Ambulatory Visit: Admitting: Orthopedic Surgery

## 2023-06-23 ENCOUNTER — Other Ambulatory Visit: Payer: Self-pay

## 2023-06-23 ENCOUNTER — Ambulatory Visit (INDEPENDENT_AMBULATORY_CARE_PROVIDER_SITE_OTHER): Admitting: Orthopedic Surgery

## 2023-06-23 DIAGNOSIS — M25531 Pain in right wrist: Secondary | ICD-10-CM | POA: Diagnosis not present

## 2023-06-23 DIAGNOSIS — S62521A Displaced fracture of distal phalanx of right thumb, initial encounter for closed fracture: Secondary | ICD-10-CM | POA: Diagnosis not present

## 2023-06-23 MED ORDER — LIDOCAINE HCL 1 % IJ SOLN
1.0000 mL | INTRAMUSCULAR | Status: AC | PRN
  Administered 2023-06-23: 1 mL

## 2023-06-23 MED ORDER — BETAMETHASONE SOD PHOS & ACET 6 (3-3) MG/ML IJ SUSP
6.0000 mg | INTRAMUSCULAR | Status: AC | PRN
  Administered 2023-06-23: 6 mg via INTRA_ARTICULAR

## 2023-06-23 NOTE — Progress Notes (Addendum)
 Kristen Manning - 89 y.o. female MRN [id number]  Date of birth: 1932/07/31  Office Visit Note: Visit Date: 06/23/2023 PCP: Jess Morita, MD Referred by: Jess Morita, MD  Subjective: No chief complaint on file.  HPI: Kristen Manning is a pleasant 89 y.o. female who presents today for evaluation of a right thumb injury sustained approximately 7 weeks prior.  Injury mechanism described as a mechanical fall.  She was seen in the emergency department setting the day of injury, underwent clinical and radiographic workup which showed a right thumb distal phalanx fracture.  She also sustained skin tearing along the dorsal aspect of the hand that is being treated with dressing care.  Today, she is also describing ulnar-sided wrist pain of the right wrist over the past few days.  Denies any significant injury to this region.  Thinks it may be related to some overuse, protecting the thumb.  Pertinent ROS were reviewed with the patient and found to be negative unless otherwise specified above in HPI.   Visit Reason: Right thumb injury Duration of symptoms: 3 weeks Hand dominance: right Occupation: retired Diabetic: No Smoking: No Heart/Lung History: yes Blood Thinners: none  Prior Testing/EMG: 05/05/23 xrays Injections (Date): none Treatments: wrap Prior Surgery: none  Assessment & Plan: Visit Diagnoses:  1. Closed displaced fracture of distal phalanx of right thumb, initial encounter   2. Pain in right wrist     Plan: She is doing well overall, repeat x-rays today show the distal phalanx fracture of the right thumb with minimal displacement and appropriate interval healing.  Given her minimal displacement and underlying arthritis throughout the hand, we can treat this fracture nonoperatively and allow for ongoing healing in this region.  She does have ongoing tenderness in this area which correlates with radiographic workup.  As far as the skin tearing of the right  hand, this is responding well to dressing changes which she should continue as instructed.   As for the ulnar-sided wrist pain, x-rays of the right wrist were obtained today which do show some degenerative changes at the ulnar carpal interval with some ulnar positivity.  Her examination does show some concern for TFCC tenderness at the foveal region and some impaction with ulnar deviation.  I discussed with her the potential for underlying TFCC injury, potentially degenerative in nature.  We discussed potential treatment options ranging from conservative to surgical.  From a conservative standpoint, we discussed ongoing immobilization, activity modification, therapeutic exercise as well as anti-inflammatory medication both topical and oral, finally we also discussed possibility of cortisone injection.  Given her ongoing pain in this region, she is interested in cortisone injection today.  This was performed under ultrasound guidance to the right ulnar wrist region.  Injection was tolerated well, she will return to me in approximate 6 to 8 weeks for recheck.  I spent 30 minutes in the care of this patient today including review of previous documentation, imaging obtained, face-to-face time discussing all options regarding treatment and documenting the encounter.    Follow-up: No follow-ups on file.   Meds & Orders: No orders of the defined types were placed in this encounter.   Orders Placed This Encounter  Procedures   XR Hand Complete Right   XR Wrist Complete Right     Procedures: Medium Joint Inj: R radiocarpal (Ulnar aspect of the wrist, TFCC region) on 06/23/2023 2:02 PM Indications: pain Details: 25 G needle, ultrasound-guided dorsal approach Medications: 1 mL lidocaine  1 %;  6 mg betamethasone  acetate-betamethasone  sodium phosphate  6 (3-3) MG/ML Outcome: tolerated well, no immediate complications  Images were saved in the [facility] ultrasound database Procedure, treatment alternatives,  risks and benefits explained, specific risks discussed.          Clinical History: No specialty comments available.  She reports that she has never smoked. She has never used smokeless tobacco. No results for input(s): HGBA1C, LABURIC in the last 8760 hours.  Objective:   Vital Signs: There were no vitals taken for this visit.  Physical Exam  Gen: Well-appearing, in no acute distress; non-toxic CV: Regular Rate. Well-perfused. Warm.  Resp: Breathing unlabored on room air; no wheezing. Psych: Fluid speech in conversation; appropriate affect; normal thought process  Ortho Exam Right thumb: - Skin is intact over the distal phalanx, tenderness associated over the IP joint, range of motion is limited at the IP joint secondary to pain - Sensation is intact distally to the thumb, normal color and capillary refill - Dorsal skin tearing notable with area of degloving over the dorsal aspect of the hand, measures approximately 3 cm x 3 cm, normal granulation tissue is present  Imaging: No results found.   Past Medical/Family/Surgical/Social History: Medications & Allergies reviewed per EMR, new medications updated. 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   Fracture of proximal phalanx of finger of right hand 05/14/2023   Fall 05/05/2023   Urge incontinence of urine 03/05/2023   OAB (overactive bladder) 08/30/2021   Chronic left shoulder pain 03/10/2019   Obesity, morbid (HCC) 02/11/2018   DDD (degenerative disc disease), lumbar 10/07/2016   Primary osteoarthritis of both hands 10/07/2016   Primary osteoarthritis of both feet 10/07/2016   Tension-type headache, not intractable 09/16/2016   GERD (gastroesophageal reflux disease) 09/11/2016   ANA positive 09/02/2016   History of total knee replacement, bilateral 03/20/2015   Hypothyroidism 09/21/2014   Gout 06/20/2014   Lower leg edema 06/13/2014    Meningioma (HCC) 10/07/2013   Chronic tension-type headache, intractable 10/07/2013   Depression 06/23/2013   Insomnia 05/26/2013   History of total hip arthroplasty 04/06/2013   Routine general medical examination at a health care facility 10/17/2011   HTN (hypertension) 07/03/2011   Hyperlipidemia 07/03/2011   Venous insufficiency, peripheral 07/03/2011   Past Medical History:  Diagnosis Date   Arthritis    Bacterial infection    in lungs in Jan 2015   Bilateral lower extremity edema    Cancer (HCC)    skin cancer, back, legs melonama, sees Dr Gara July, derm   Chronic back pain    GERD (gastroesophageal reflux disease)    takes Omeprazole  daily   Headache(784.0)    History of migraine    last one about 15 yrs ago   Hyperlipidemia    takes Zetia  daily   Hypertension    takes HCTZ daily   Joint pain    Joint swelling    Myocardial infarction (HCC)    pt states EKG always shows infarct but no change from previous ekg;never knew anything about it   Nocturia    Osteoarthritis    Pneumonia    hx of-many yrs ago   Pneumonia    PONV (postoperative nausea and vomiting)    Shortness of breath    with exertion   Urinary frequency    Urinary incontinence    Urinary urgency    Family History  Problem Relation Age of Onset  Kidney disease Mother    Heart disease Mother    Hypertension Mother    Varicose Veins Mother    Kidney failure Mother    Alcohol abuse Father    Stroke Father        several    Hypertension Father    Varicose Veins Father    Alcohol abuse Brother    Hyperlipidemia Brother    Hypertension Brother    Early death Brother    Varicose Veins Brother    Bladder Cancer Brother    Heart disease Brother    Hypertension Brother    Arthritis Brother        Gout   Varicose Veins Son    Deep vein thrombosis Son    Colon cancer Son    Breast cancer Maternal Aunt    Pancreatic cancer Neg Hx    Stomach cancer Neg Hx    Esophageal cancer Neg Hx     Liver disease Neg Hx    Past Surgical History:  Procedure Laterality Date   ABDOMINAL HYSTERECTOMY     bladder tack  01/08/1996   BLEPHAROPLASTY Bilateral    JOINT REPLACEMENT Right 02/22/2013   Knee   OOPHORECTOMY  01/08/1996   only one   REPLACEMENT TOTAL KNEE Left 10/02/2009   TONSILLECTOMY  as a child ? date   TOTAL KNEE ARTHROPLASTY Right 02/22/2013   Procedure: TOTAL KNEE ARTHROPLASTY;  Surgeon: Christie Cox, MD;  Location: [facility] OR;  Service: Orthopedics;  Laterality: Right;   Social History   Occupational History   Not on file  Tobacco Use   Smoking status: Never   Smokeless tobacco: Never  Vaping Use   Vaping status: Never Used  Substance and Sexual Activity   Alcohol use: No   Drug use: No   Sexual activity: Not on file    Arhaan Chesnut Alvia Jointer, M.D. [facility], Hand Surgery

## 2023-06-25 ENCOUNTER — Ambulatory Visit: Payer: Self-pay

## 2023-06-25 NOTE — Telephone Encounter (Signed)
 FYI Only or Action Required?: FYI only for provider  Patient was last seen in primary care on 05/28/2023 by Jess Morita, MD. Called Nurse Triage reporting Rash. Symptoms began today. Interventions attempted: OTC medications: healing Vaseline and Other: elevation. Symptoms are: stable.  Triage Disposition: Home Care  Patient/caregiver understands and will follow disposition?: Yes         Reason for Triage: Bleeding rash  on leg  Reason for Disposition  Mild localized rash  Answer Assessment - Initial Assessment Questions 1. APPEARANCE of RASH: Describe the rash.      Red and scaly 2. LOCATION: Where is the rash located?      Bilateral legs  4. SIZE: How big are the spots? (Inches, centimeters or compare to size of a coin)      Small spot 5. ONSET: When did the rash start?      Ongoing but had one bleeding today 6. ITCHING: Does the rash itch? If Yes, ask: How bad is the itch?  (Scale 0-10; or none, mild, moderate, severe)     no 7. PAIN: Does the rash hurt? If Yes, ask: How bad is the pain?  (Scale 0-10; or none, mild, moderate, severe)    - NONE (0): no pain    - MILD (1-3): doesn't interfere with normal activities     - MODERATE (4-7): interferes with normal activities or awakens from sleep     - SEVERE (8-10): excruciating pain, unable to do any normal activities     no 8. OTHER SYMPTOMS: Do you have any other symptoms? (e.g., fever)     bleeding    Was seeing derm for Stasis dermatitis but not seeing them anymore and had a spot that bleed today.  Protocols used: Rash or Redness - Localized-A-AH

## 2023-06-26 NOTE — Telephone Encounter (Signed)
 Called pt to schedule her appt. She states she is feeling much better and her leg is better.  Pt denied an appt to be seen.

## 2023-08-18 ENCOUNTER — Ambulatory Visit: Admitting: Orthopedic Surgery

## 2023-09-10 ENCOUNTER — Other Ambulatory Visit (INDEPENDENT_AMBULATORY_CARE_PROVIDER_SITE_OTHER)

## 2023-09-10 ENCOUNTER — Ambulatory Visit (INDEPENDENT_AMBULATORY_CARE_PROVIDER_SITE_OTHER): Admitting: Orthopedic Surgery

## 2023-09-10 DIAGNOSIS — M25511 Pain in right shoulder: Secondary | ICD-10-CM | POA: Diagnosis not present

## 2023-09-10 DIAGNOSIS — S62521A Displaced fracture of distal phalanx of right thumb, initial encounter for closed fracture: Secondary | ICD-10-CM

## 2023-09-10 NOTE — Progress Notes (Unsigned)
 ALYRIA KRACK - 89 y.o. female MRN [id number]  Date of birth: 04/30/1932  Office Visit Note: Visit Date: 09/10/2023 PCP: Mahlon Comer BRAVO, MD Referred by: Mahlon Comer BRAVO, MD  Subjective: No chief complaint on file.  HPI: BELINA MANDILE is a pleasant 89 y.o. female who returns today for follow-up of a right thumb injury sustained approximately 4 months prior.  More recently, she underwent radiocarpal injection of the right wrist for ongoing arthritic pain.  States that the hand and wrist are doing quite well.  Is experiencing some symptoms of stiffness of the left shoulder.  Pertinent ROS were reviewed with the patient and found to be negative unless otherwise specified above in HPI.    Assessment & Plan: Visit Diagnoses:  1. Closed displaced fracture of distal phalanx of right thumb, initial encounter   2. Right shoulder pain, unspecified chronicity     Plan: She is doing well overall with the right wrist and hand.  Repeat x-rays today show the distal phalanx fracture of the right thumb withappropriate healing.   As for the wrist pain, she has previously known degenerative changes at the ulnar carpal interval with some ulnar positivity.  She responded quite well to the cortisone injection in the TFC region at her prior visit.    From a hand standpoint, we will continue to take an observation approach for the right thumb and wrist.  As for the left shoulder, she is demonstrating some signs and symptoms consistent with adhesive capsulitis.  I will have her be seen by one of our shoulder colleagues in the near future for evaluation and possible cortisone injection and therapy.  I spent 30 minutes in the care of this patient today including review of previous documentation, imaging obtained, face-to-face time discussing all options regarding treatment and documenting the encounter.    Follow-up: No follow-ups on file.   Meds & Orders: No orders of the defined types were  placed in this encounter.   Orders Placed This Encounter  Procedures   XR Finger Thumb Right   Ambulatory referral to Orthopedic Surgery     Procedures: No procedures performed      Clinical History: No specialty comments available.  She reports that she has never smoked. She has never used smokeless tobacco. No results for input(s): HGBA1C, LABURIC in the last 8760 hours.  Objective:   Vital Signs: There were no vitals taken for this visit.  Physical Exam  Gen: Well-appearing, in no acute distress; non-toxic CV: Regular Rate. Well-perfused. Warm.  Resp: Breathing unlabored on room air; no wheezing. Psych: Fluid speech in conversation; appropriate affect; normal thought process  Ortho Exam Right thumb with minimal pain, range of motion is appropriate Right wrist with mild tenderness over the TFCC region, slight pain with ulnar deviation, able to perform wrist flexion and extension, pronation and supination without significant restriction or pain  Imaging: No results found. X-rays of the right thumb obtained today  Past Medical/Family/Surgical/Social History: Medications & Allergies reviewed per EMR, new medications updated. 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   Fracture of proximal phalanx of finger of right hand 05/14/2023   Fall 05/05/2023   Urge incontinence of urine 03/05/2023   OAB (overactive bladder) 08/30/2021   Chronic left shoulder pain 03/10/2019   Obesity, morbid (HCC) 02/11/2018   DDD (degenerative disc disease), lumbar 10/07/2016   Primary osteoarthritis of both hands 10/07/2016  Primary osteoarthritis of both feet 10/07/2016   Tension-type headache, not intractable 09/16/2016   GERD (gastroesophageal reflux disease) 09/11/2016   ANA positive 09/02/2016   History of total knee replacement, bilateral 03/20/2015   Hypothyroidism 09/21/2014   Gout 06/20/2014    Lower leg edema 06/13/2014   Meningioma (HCC) 10/07/2013   Chronic tension-type headache, intractable 10/07/2013   Depression 06/23/2013   Insomnia 05/26/2013   History of total hip arthroplasty 04/06/2013   Routine general medical examination at a health care facility 10/17/2011   HTN (hypertension) 07/03/2011   Hyperlipidemia 07/03/2011   Venous insufficiency, peripheral 07/03/2011   Past Medical History:  Diagnosis Date   Arthritis    Bacterial infection    in lungs in Jan 2015   Bilateral lower extremity edema    Cancer (HCC)    skin cancer, back, legs melonama, sees Dr Cheyenne, derm   Chronic back pain    GERD (gastroesophageal reflux disease)    takes Omeprazole  daily   Headache(784.0)    History of migraine    last one about 15 yrs ago   Hyperlipidemia    takes Zetia  daily   Hypertension    takes HCTZ daily   Joint pain    Joint swelling    Myocardial infarction (HCC)    pt states EKG always shows infarct but no change from previous ekg;never knew anything about it   Nocturia    Osteoarthritis    Pneumonia    hx of-many yrs ago   Pneumonia    PONV (postoperative nausea and vomiting)    Shortness of breath    with exertion   Urinary frequency    Urinary incontinence    Urinary urgency    Family History  Problem Relation Age of Onset   Kidney disease Mother    Heart disease Mother    Hypertension Mother    Varicose Veins Mother    Kidney failure Mother    Alcohol abuse Father    Stroke Father        several    Hypertension Father    Varicose Veins Father    Alcohol abuse Brother    Hyperlipidemia Brother    Hypertension Brother    Early death Brother    Varicose Veins Brother    Bladder Cancer Brother    Heart disease Brother    Hypertension Brother    Arthritis Brother        Gout   Varicose Veins Son    Deep vein thrombosis Son    Colon cancer Son    Breast cancer Maternal Aunt    Pancreatic cancer Neg Hx    Stomach cancer Neg Hx     Esophageal cancer Neg Hx    Liver disease Neg Hx    Past Surgical History:  Procedure Laterality Date   ABDOMINAL HYSTERECTOMY     bladder tack  01/08/1996   BLEPHAROPLASTY Bilateral    JOINT REPLACEMENT Right 02/22/2013   Knee   OOPHORECTOMY  01/08/1996   only one   REPLACEMENT TOTAL KNEE Left 10/02/2009   TONSILLECTOMY  as a child ? date   TOTAL KNEE ARTHROPLASTY Right 02/22/2013   Procedure: TOTAL KNEE ARTHROPLASTY;  Surgeon: Marcey Raman, MD;  Location: [facility] OR;  Service: Orthopedics;  Laterality: Right;   Social History   Occupational History   Not on file  Tobacco Use   Smoking status: Never   Smokeless tobacco: Never  Vaping Use   Vaping status: Never Used  Substance  and Sexual Activity   Alcohol use: No   Drug use: No   Sexual activity: Not on file    Marolyn Urschel Estela) Arlinda, M.D. [facility], Hand Surgery

## 2023-09-23 ENCOUNTER — Other Ambulatory Visit: Payer: Self-pay | Admitting: Family Medicine

## 2023-09-23 ENCOUNTER — Telehealth: Payer: Self-pay

## 2023-09-23 DIAGNOSIS — Z1231 Encounter for screening mammogram for malignant neoplasm of breast: Secondary | ICD-10-CM

## 2023-09-23 NOTE — Telephone Encounter (Unsigned)
 Copied from CRM [id number]. Topic: General - Other >> Sep 23, 2023  9:37 AM Rea ORN wrote: Reason for CRM: Pt would like to know what shots she is due for. She would like to have them done tomorrow at CPE appt. When asked what shots she is referring to, the pt stated the nurse will know. Pt stated she had shots at [facility] before and wanted nurse to check with them. Please call back.

## 2023-09-23 NOTE — Telephone Encounter (Signed)
 Spoke with pt and she was inquiring about Covid,Flu and RSV vaccine. Pt is going to get HD Flu in office but will go to pharmacy for the others.

## 2023-09-24 ENCOUNTER — Encounter: Payer: Self-pay | Admitting: Family Medicine

## 2023-09-24 ENCOUNTER — Ambulatory Visit: Admitting: Family Medicine

## 2023-09-24 VITALS — BP 118/70 | HR 69 | Temp 98.0°F | Ht 64.0 in | Wt 207.0 lb

## 2023-09-24 DIAGNOSIS — R2689 Other abnormalities of gait and mobility: Secondary | ICD-10-CM

## 2023-09-24 DIAGNOSIS — Z0000 Encounter for general adult medical examination without abnormal findings: Secondary | ICD-10-CM | POA: Diagnosis not present

## 2023-09-24 DIAGNOSIS — J329 Chronic sinusitis, unspecified: Secondary | ICD-10-CM | POA: Diagnosis not present

## 2023-09-24 DIAGNOSIS — B9689 Other specified bacterial agents as the cause of diseases classified elsewhere: Secondary | ICD-10-CM

## 2023-09-24 DIAGNOSIS — Z23 Encounter for immunization: Secondary | ICD-10-CM

## 2023-09-24 LAB — BASIC METABOLIC PANEL WITH GFR
BUN: 23 mg/dL (ref 6–23)
CO2: 27 meq/L (ref 19–32)
Calcium: 9.8 mg/dL (ref 8.4–10.5)
Chloride: 105 meq/L (ref 96–112)
Creatinine, Ser: 1.24 mg/dL — ABNORMAL HIGH (ref 0.40–1.20)
GFR: 38.73 mL/min — ABNORMAL LOW
Glucose, Bld: 91 mg/dL (ref 70–99)
Potassium: 5.2 meq/L — ABNORMAL HIGH (ref 3.5–5.1)
Sodium: 138 meq/L (ref 135–145)

## 2023-09-24 LAB — CBC WITH DIFFERENTIAL/PLATELET
Basophils Absolute: 0 K/uL (ref 0.0–0.1)
Basophils Relative: 0.3 % (ref 0.0–3.0)
Eosinophils Absolute: 0.1 K/uL (ref 0.0–0.7)
Eosinophils Relative: 1.2 % (ref 0.0–5.0)
HCT: 38.2 % (ref 36.0–46.0)
Hemoglobin: 12.5 g/dL (ref 12.0–15.0)
Lymphocytes Relative: 19.2 % (ref 12.0–46.0)
Lymphs Abs: 1.6 K/uL (ref 0.7–4.0)
MCHC: 32.9 g/dL (ref 30.0–36.0)
MCV: 91.6 fl (ref 78.0–100.0)
Monocytes Absolute: 0.6 K/uL (ref 0.1–1.0)
Monocytes Relative: 7.4 % (ref 3.0–12.0)
Neutro Abs: 6 K/uL (ref 1.4–7.7)
Neutrophils Relative %: 71.9 % (ref 43.0–77.0)
Platelets: 308 K/uL (ref 150.0–400.0)
RBC: 4.17 Mil/uL (ref 3.87–5.11)
RDW: 14.1 % (ref 11.5–15.5)
WBC: 8.3 K/uL (ref 4.0–10.5)

## 2023-09-24 LAB — HEPATIC FUNCTION PANEL
ALT: 19 U/L (ref 0–35)
AST: 25 U/L (ref 0–37)
Albumin: 4 g/dL (ref 3.5–5.2)
Alkaline Phosphatase: 40 U/L (ref 39–117)
Bilirubin, Direct: 0.1 mg/dL (ref 0.0–0.3)
Total Bilirubin: 0.5 mg/dL (ref 0.2–1.2)
Total Protein: 7.3 g/dL (ref 6.0–8.3)

## 2023-09-24 LAB — TSH: TSH: 1.74 u[IU]/mL (ref 0.35–5.50)

## 2023-09-24 LAB — LIPID PANEL
Cholesterol: 147 mg/dL (ref 0–200)
HDL: 63.2 mg/dL (ref >39.00)
LDL Cholesterol: 70 mg/dL (ref 0–99)
NonHDL: 83.47
Total CHOL/HDL Ratio: 2
Triglycerides: 67 mg/dL (ref 0.0–149.0)
VLDL: 13.4 mg/dL (ref 0.0–40.0)

## 2023-09-24 LAB — VITAMIN D 25 HYDROXY (VIT D DEFICIENCY, FRACTURES): VITD: 29.63 ng/mL — ABNORMAL LOW (ref 30.00–100.00)

## 2023-09-24 MED ORDER — AMOXICILLIN 875 MG PO TABS: 875.0000 mg | ORAL_TABLET | Freq: Two times a day (BID) | ORAL | 0 refills | Status: AC

## 2023-09-24 MED ORDER — FENOFIBRATE 160 MG PO TABS: 160.0000 mg | ORAL_TABLET | Freq: Every morning | ORAL | 2 refills | Status: AC

## 2023-09-24 MED ORDER — CETIRIZINE HCL 10 MG PO TABS: 10.0000 mg | ORAL_TABLET | Freq: Every day | ORAL | 11 refills | Status: AC

## 2023-09-24 MED ORDER — TRIAMCINOLONE ACETONIDE 0.1 % EX OINT: 1.0000 | TOPICAL_OINTMENT | Freq: Two times a day (BID) | CUTANEOUS | 1 refills | Status: AC

## 2023-09-24 MED ORDER — METOPROLOL SUCCINATE ER 25 MG PO TB24: 25.0000 mg | ORAL_TABLET | Freq: Every day | ORAL | 1 refills | Status: AC

## 2023-09-24 MED ORDER — TRIAMCINOLONE ACETONIDE 55 MCG/ACT NA AERO: 2.0000 | INHALATION_SPRAY | Freq: Every day | NASAL | 12 refills | Status: AC

## 2023-09-24 NOTE — Patient Instructions (Addendum)
 Follow up in 6 months to recheck blood pressure and cholesterol We'll notify you of your lab results and make any changes if needed START the Amoxicillin  twice daily for the sinus infection Call the Urologist and schedule a follow up appt for your bladder We'll call you to schedule your neurology appt for your balance Call with any questions or concerns Stay Safe!  Stay Healthy! Happy Fall!!!

## 2023-09-24 NOTE — Telephone Encounter (Signed)
 Koren has already spoken to patient this afternoon in regards to vaccines

## 2023-09-24 NOTE — Telephone Encounter (Signed)
 Copied from CRM #[id number]. Topic: General - Call Back - No Documentation >> Sep 24, 2023 11:55 AM Rea ORN wrote: Reason for CRM: Pt would like Tonya to call her regarding a prescription.   [phone] (H)

## 2023-09-24 NOTE — Progress Notes (Signed)
 Subjective:    Patient ID: Kristen Manning, female    DOB: [birth year], 89 y.o.   MRN: [id number]  HPI CPE- wants flu shot today.  Mammo scheduled.  UTD on Tdap, PNA  Patient Care Team    Relationship Specialty Notifications Start End  Mahlon Comer BRAVO, MD PCP - General Family Medicine  03/05/23   Rubie Kemps, MD Consulting Physician Orthopedic Surgery  08/16/13   Lynnell Nottingham, MD Consulting Physician Dermatology  03/21/14   Alwin Selinda BROCKS, DPM (Inactive) Consulting Physician Podiatry  03/21/15   Georjean Darice HERO, MD Consulting Physician Neurology  04/03/15   Court Dorn PARAS, MD Consulting Physician Cardiology  09/11/16   Pandora Cadet, [facility] Pharmacist Pharmacist  04/02/19    Comment: phone number [phone]  Pandora Cadet, [facility] Pharmacist Pharmacist  04/08/19    Comment: phone number [phone]    Health Maintenance  Topic Date Due   Zoster Vaccines- Shingrix (2 of 2) 09/26/2017   COVID-19 Vaccine (2 - Janssen risk series) 05/14/2019   Mammogram  09/17/2019   Medicare Annual Wellness (AWV)  08/28/2021   Influenza Vaccine  08/08/2023   DTaP/Tdap/Td (3 - Td or Tdap) 05/04/2033   Pneumococcal Vaccine: 50+ Years  Completed   DEXA SCAN  Completed   HPV VACCINES  Aged Out   Meningococcal B Vaccine  Aged Out      Review of Systems Patient reports no vision/hearing changes, adenopathy,fever, weight change,  persistant/recurrent hoarseness , swallowing issues, chest pain, palpitations, edema, persistant/recurrent cough, hemoptysis, dyspnea (rest/exertional/paroxysmal nocturnal), gastrointestinal bleeding (melena, rectal bleeding), abdominal pain, significant heartburn, bowel changes, Gyn symptoms (abnormal  bleeding, pain),  syncope, focal weakness, memory loss, numbness & tingling, skin/hair/nail changes, abnormal bruising or bleeding, anxiety, or depression.   Sinus pain- woke this morning 'feeling terrible'.  Now w/ HA, facial pain, L ear pain.  No cough.  + nasal congestion and  PND  + urinary frequency + poor balance    Objective:   Physical Exam General Appearance:    Alert, cooperative, no distress, appears stated age  Head:    Normocephalic, without obvious abnormality, atraumatic  Eyes:    PERRL, conjunctiva/corneas clear, EOM's intact, fundi    benign, both eyes  Ears:    Normal TM's and external ear canals, both ears  Nose:   Nares normal, septum midline, bilateral nasal congestion w/ TTP over frontal and maxillary sinuses  Throat:   Lips, mucosa, and tongue normal; teeth and gums normal  Neck:   Supple, symmetrical, trachea midline, no adenopathy;    Thyroid : no enlargement/tenderness/nodules  Back:     Symmetric, no curvature, ROM normal, no CVA tenderness  Lungs:     Clear to auscultation bilaterally, respirations unlabored  Chest Wall:    No tenderness or deformity   Heart:    Regular rate and rhythm, S1 and S2 normal, no murmur, rub   or gallop  Breast Exam:    Deferred to mammo  Abdomen:     Soft, non-tender, bowel sounds active all four quadrants,    no masses, no organomegaly  Genitalia:    Deferred  Rectal:    Extremities:   Extremities normal, atraumatic, no cyanosis or edema  Pulses:   2+ and symmetric all extremities  Skin:   Chronic venous stasis changes of legs bilaterally  Lymph nodes:   Cervical, supraclavicular, and axillary nodes normal  Neurologic:   CNII-XII intact          Assessment & Plan:  Bacterial  sinusitis- new.  Pt's sxs and PE consistent w/ infxn.  Start abx.  Reviewed supportive care and red flags that should prompt return.  Pt expressed understanding and is in agreement w/ plan.   Balance problem- deteriorated.  Pt reports she has a lot of trouble w/ balance- even using her Rollator- and has sustained multiple falls.  Refer to Neuro for complete evaluation.  Will follow closely.

## 2023-09-25 ENCOUNTER — Ambulatory Visit: Payer: Self-pay | Admitting: Family Medicine

## 2023-09-25 NOTE — Progress Notes (Signed)
 Lab results have been discussed.   Verbalized understanding? Yes  Are there any questions? No   Will start taking vitamin d  supplement or multivitamin

## 2023-09-28 NOTE — Assessment & Plan Note (Signed)
 Pt's PE unchanged from previous w/ exception of sinus TTP.  Mammogram is scheduled.  UTD on Tdap, PNA.  Flu shot given today.  Check labs.  Anticipatory guidance provided.

## 2023-09-29 ENCOUNTER — Encounter: Payer: Self-pay | Admitting: Neurology

## 2023-10-10 ENCOUNTER — Other Ambulatory Visit: Payer: Self-pay

## 2023-10-10 ENCOUNTER — Other Ambulatory Visit (INDEPENDENT_AMBULATORY_CARE_PROVIDER_SITE_OTHER): Payer: Self-pay

## 2023-10-10 ENCOUNTER — Encounter: Payer: Self-pay | Admitting: Orthopedic Surgery

## 2023-10-10 ENCOUNTER — Ambulatory Visit: Admitting: Orthopedic Surgery

## 2023-10-10 DIAGNOSIS — M19012 Primary osteoarthritis, left shoulder: Secondary | ICD-10-CM | POA: Diagnosis not present

## 2023-10-10 DIAGNOSIS — M25512 Pain in left shoulder: Secondary | ICD-10-CM

## 2023-10-10 DIAGNOSIS — M7502 Adhesive capsulitis of left shoulder: Secondary | ICD-10-CM | POA: Diagnosis not present

## 2023-10-10 NOTE — Progress Notes (Signed)
 Office Visit Note   Patient: Kristen Manning           Date of Birth: 07-Feb-1932           MRN: [id number] Visit Date: 10/10/2023 Requested by: Mahlon Comer BRAVO, MD [phone]  [place] [address] PCP: Mahlon Comer BRAVO, MD  Subjective: No chief complaint on file.   HPI: Kristen Manning is a 89 y.o. female who presents to the office reporting left shoulder pain.  She describes having pain for years.  She describes having a frozen shoulder and torn rotator cuff.  She is right-hand dominant.  Reports decreased range of motion as well as difficulty with ADLs.  Worse since a fall 2 months ago.  She describes having a shingles injection which she believes hit her nerve.  Reported fire like pain down to the fingers at the time of the injection.  She has had a wrist injection which has helped her hand problems.  She lives with her mother.  Radiographs done at the emergency department do demonstrate rotator cuff arthropathy..                ROS: All systems reviewed are negative as they relate to the chief complaint within the history of present illness.  Patient denies fevers or chills.  Assessment & Plan: Visit Diagnoses: No diagnosis found.  Plan: Impression is left shoulder rotator cuff arthropathy.  She does have some diminished active and passive range of motion as well as rotator cuff weakness.  Ultrasound-guided injection into the left shoulder performed today for pain relief.  Will see how that works for her.  I do not think this is really rising to the level of surgical intervention.  We could do injections every 4 to 6 months if they help.  Follow-Up Instructions: No follow-ups on file.   Orders:  No orders of the defined types were placed in this encounter.  No orders of the defined types were placed in this encounter.     Procedures: Large Joint Inj: L glenohumeral on 10/10/2023 8:25 PM Indications: diagnostic evaluation and pain Details: 22 G 3.5 in  needle, ultrasound-guided posterior approach  Arthrogram: No  Medications: 9 mL bupivacaine  0.5 %; 5 mL lidocaine  1 %; 40 mg triamcinolone  acetonide 40 MG/ML Outcome: tolerated well, no immediate complications Procedure, treatment alternatives, risks and benefits explained, specific risks discussed. Consent was given by the patient. Immediately prior to procedure a time out was called to verify the correct patient, procedure, equipment, support staff and site/side marked as required. Patient was prepped and draped in the usual sterile fashion.       Clinical Data: No additional findings.  Objective: Vital Signs: There were no vitals taken for this visit.  Physical Exam:  Constitutional: Patient appears well-developed HEENT:  Head: Normocephalic Eyes:EOM are normal Neck: Normal range of motion Cardiovascular: Normal rate Pulmonary/chest: Effort normal Neurologic: Patient is alert Skin: Skin is warm Psychiatric: Patient has normal mood and affect  Ortho Exam: Ortho exam demonstrates range of motion on left shoulder of 20/90/100.  External rotation strength 4 out of 5 on the left and subscap strength 5 out of 5 on the left.  Does have some crepitus with passive range of motion.  Deltoid is functional.  No masses lymphadenopathy or skin changes noted in that left shoulder region.  Specialty Comments:  No specialty comments available.  Imaging: No results found.   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   Fracture of proximal phalanx of finger of right hand 05/14/2023   Fall 05/05/2023   Urge incontinence of urine 03/05/2023   OAB (overactive bladder) 08/30/2021   Chronic left shoulder pain 03/10/2019   Obesity, morbid (HCC) 02/11/2018   DDD (degenerative disc disease), lumbar 10/07/2016   Primary osteoarthritis of both hands 10/07/2016   Primary osteoarthritis of both feet  10/07/2016   Tension-type headache, not intractable 09/16/2016   GERD (gastroesophageal reflux disease) 09/11/2016   ANA positive 09/02/2016   History of total knee replacement, bilateral 03/20/2015   Hypothyroidism 09/21/2014   Gout 06/20/2014   Lower leg edema 06/13/2014   Meningioma (HCC) 10/07/2013   Chronic tension-type headache, intractable 10/07/2013   Depression 06/23/2013   Insomnia 05/26/2013   History of total hip arthroplasty 04/06/2013   Routine general medical examination at a health care facility 10/17/2011   HTN (hypertension) 07/03/2011   Hyperlipidemia 07/03/2011   Venous insufficiency, peripheral 07/03/2011   Past Medical History:  Diagnosis Date   Arthritis    Bacterial infection    in lungs in Jan 2015   Bilateral lower extremity edema    Cancer (HCC)    skin cancer, back, legs melonama, sees Dr Cheyenne, derm   Chronic back pain    GERD (gastroesophageal reflux disease)    takes Omeprazole  daily   Headache(784.0)    History of migraine    last one about 15 yrs ago   Hyperlipidemia    takes Zetia  daily   Hypertension    takes HCTZ daily   Joint pain    Joint swelling    Myocardial infarction (HCC)    pt states EKG always shows infarct but no change from previous ekg;never knew anything about it   Nocturia    Osteoarthritis    Pneumonia    hx of-many yrs ago   Pneumonia    PONV (postoperative nausea and vomiting)    Shortness of breath    with exertion   Urinary frequency    Urinary incontinence    Urinary urgency     Family History  Problem Relation Age of Onset   Kidney disease Mother    Heart disease Mother    Hypertension Mother    Varicose Veins Mother    Kidney failure Mother    Alcohol abuse Father    Stroke Father        several    Hypertension Father    Varicose Veins Father    Alcohol abuse Brother    Hyperlipidemia Brother    Hypertension Brother    Early death Brother    Varicose Veins Brother    Bladder Cancer  Brother    Heart disease Brother    Hypertension Brother    Arthritis Brother        Gout   Varicose Veins Son    Deep vein thrombosis Son    Colon cancer Son    Breast cancer Maternal Aunt    Pancreatic cancer Neg Hx    Stomach cancer Neg Hx    Esophageal cancer Neg Hx    Liver disease Neg Hx     Past Surgical History:  Procedure Laterality Date   ABDOMINAL HYSTERECTOMY     bladder tack  01/08/1996   BLEPHAROPLASTY Bilateral    JOINT REPLACEMENT Right 02/22/2013   Knee   OOPHORECTOMY  01/08/1996   only one   REPLACEMENT TOTAL KNEE  Left 10/02/2009   TONSILLECTOMY  as a child ? date   TOTAL KNEE ARTHROPLASTY Right 02/22/2013   Procedure: TOTAL KNEE ARTHROPLASTY;  Surgeon: Marcey Raman, MD;  Location: [facility] OR;  Service: Orthopedics;  Laterality: Right;   Social History   Occupational History   Not on file  Tobacco Use   Smoking status: Never   Smokeless tobacco: Never  Vaping Use   Vaping status: Never Used  Substance and Sexual Activity   Alcohol use: No   Drug use: No   Sexual activity: Not on file

## 2023-10-12 MED ORDER — BUPIVACAINE HCL 0.5 % IJ SOLN
9.0000 mL | INTRAMUSCULAR | Status: AC | PRN
  Administered 2023-10-10: 9 mL via INTRA_ARTICULAR

## 2023-10-12 MED ORDER — TRIAMCINOLONE ACETONIDE 40 MG/ML IJ SUSP
40.0000 mg | INTRAMUSCULAR | Status: AC | PRN
  Administered 2023-10-10: 40 mg via INTRA_ARTICULAR

## 2023-10-12 MED ORDER — LIDOCAINE HCL 1 % IJ SOLN
5.0000 mL | INTRAMUSCULAR | Status: AC | PRN
  Administered 2023-10-10: 5 mL

## 2023-10-16 ENCOUNTER — Ambulatory Visit
Admission: RE | Admit: 2023-10-16 | Discharge: 2023-10-16 | Disposition: A | Source: Ambulatory Visit | Attending: Family Medicine | Admitting: Family Medicine

## 2023-10-16 DIAGNOSIS — Z1231 Encounter for screening mammogram for malignant neoplasm of breast: Secondary | ICD-10-CM

## 2023-10-29 ENCOUNTER — Ambulatory Visit: Admitting: Cardiovascular Disease

## 2023-11-14 ENCOUNTER — Ambulatory Visit: Admitting: Neurology

## 2023-11-14 ENCOUNTER — Encounter: Payer: Self-pay | Admitting: Neurology

## 2023-11-18 ENCOUNTER — Ambulatory Visit: Admitting: Cardiovascular Disease

## 2023-11-18 ENCOUNTER — Other Ambulatory Visit: Payer: Self-pay | Admitting: Family Medicine

## 2023-11-25 ENCOUNTER — Ambulatory Visit: Admitting: Cardiovascular Disease

## 2023-11-30 ENCOUNTER — Ambulatory Visit
Admission: RE | Admit: 2023-11-30 | Discharge: 2023-11-30 | Disposition: A | Discharge status: Home / Self Care | Source: Ambulatory Visit | Attending: Emergency Medicine | Admitting: Emergency Medicine

## 2023-11-30 ENCOUNTER — Ambulatory Visit: Admitting: Radiology

## 2023-11-30 VITALS — BP 185/78 | HR 92 | Temp 98.0°F | Resp 19

## 2023-11-30 DIAGNOSIS — J329 Chronic sinusitis, unspecified: Secondary | ICD-10-CM | POA: Diagnosis not present

## 2023-11-30 DIAGNOSIS — R051 Acute cough: Secondary | ICD-10-CM

## 2023-11-30 LAB — POC COVID19/FLU A&B COMBO
Covid Antigen, POC: NEGATIVE
Influenza A Antigen, POC: NEGATIVE
Influenza B Antigen, POC: NEGATIVE

## 2023-11-30 MED ORDER — AMOXICILLIN-POT CLAVULANATE 875-125 MG PO TABS: 1.0000 | ORAL_TABLET | Freq: Two times a day (BID) | ORAL | 0 refills | Status: AC

## 2023-11-30 MED ORDER — DEXAMETHASONE SOD PHOSPHATE PF 10 MG/ML IJ SOLN
10.0000 mg | Freq: Once | INTRAMUSCULAR | Status: AC
  Administered 2023-11-30: 10 mg via INTRAMUSCULAR

## 2023-11-30 MED ORDER — BENZONATATE 100 MG PO CAPS
100.0000 mg | ORAL_CAPSULE | Freq: Three times a day (TID) | ORAL | 0 refills | Status: AC
Start: 2023-11-30 — End: ?

## 2023-11-30 NOTE — Discharge Instructions (Signed)
 We have given her a steroid injection to help with your shortness of breath.  Please take things easy in the next few days.  Take the antibiotics twice daily with food to help treat any infection.  This will cover for sinus infection and pneumonia.  You can use the cough medicine every 8 hours as needed to suppress cough, this may not be covered with your insurance.  Ideally your shortness of breath will improve over the next day or so with the steroid.  If you do not have any improvement please follow-up with your primary care provider tomorrow.  If you develop any worsening symptoms seek immediate care at the nearest emergency department.

## 2023-11-30 NOTE — ED Provider Notes (Signed)
 [facility]    CSN: [id number] Arrival date & time: 11/30/23  1126      History   Chief Complaint Chief Complaint  Patient presents with   Sore Throat   Headache    HPI Kristen Manning is a 89 y.o. female.   Patient presents to clinic over concern of sinus headache, nasal congestion, rhinorrhea, sore throat and generalized bodyaches.  Symptoms started approximately 6 days ago.  Yesterday patient started with a dry hacking cough that kept her up all night.  Cough continues into today.  Feels like she could feel something rattling within her chest.  When symptoms started patient thought she had a sinus infection, reports she gets these frequently.  Feels short of breath with ambulation.  Typically does not ambulate very far.  Does use a rolling walker.  Has had multiple falls within the past few months, high fall risk.  2 months ago GFR was 38. Has taken all home medications, reports BP is high because she is nervous.   The history is provided by the patient and medical records.  Sore Throat  Headache   Past Medical History:  Diagnosis Date   Arthritis    Bacterial infection    in lungs in Jan 2015   Bilateral lower extremity edema    Cancer (HCC)    skin cancer, back, legs melonama, sees Dr Cheyenne, derm   Chronic back pain    GERD (gastroesophageal reflux disease)    takes Omeprazole  daily   Headache(784.0)    History of migraine    last one about 15 yrs ago   Hyperlipidemia    takes Zetia  daily   Hypertension    takes HCTZ daily   Joint pain    Joint swelling    Myocardial infarction [facility])    pt states EKG always shows infarct but no change from previous ekg;never knew anything about it   Nocturia    Osteoarthritis    Pneumonia    hx of-many yrs ago   Pneumonia    PONV (postoperative nausea and vomiting)    Shortness of breath    with exertion   Urinary frequency    Urinary incontinence    Urinary urgency     Patient Active Problem  List   Diagnosis Date Noted   Thyroid  nodule 05/14/2023   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   Fracture of proximal phalanx of finger of right hand 05/14/2023   Fall 05/05/2023   Urge incontinence of urine 03/05/2023   OAB (overactive bladder) 08/30/2021   Chronic left shoulder pain 03/10/2019   Obesity, morbid (HCC) 02/11/2018   DDD (degenerative disc disease), lumbar 10/07/2016   Primary osteoarthritis of both hands 10/07/2016   Primary osteoarthritis of both feet 10/07/2016   Tension-type headache, not intractable 09/16/2016   GERD (gastroesophageal reflux disease) 09/11/2016   ANA positive 09/02/2016   History of total knee replacement, bilateral 03/20/2015   Hypothyroidism 09/21/2014   Gout 06/20/2014   Lower leg edema 06/13/2014   Meningioma (HCC) 10/07/2013   Chronic tension-type headache, intractable 10/07/2013   Depression 06/23/2013   Insomnia 05/26/2013   History of total hip arthroplasty 04/06/2013   Routine general medical examination at a health care facility 10/17/2011   HTN (hypertension) 07/03/2011   Hyperlipidemia 07/03/2011   Venous insufficiency, peripheral 07/03/2011    Past Surgical History:  Procedure Laterality Date   ABDOMINAL HYSTERECTOMY     bladder tack  01/08/1996   BLEPHAROPLASTY Bilateral    JOINT  REPLACEMENT Right 02/22/2013   Knee   OOPHORECTOMY  01/08/1996   only one   REPLACEMENT TOTAL KNEE Left 10/02/2009   TONSILLECTOMY  as a child ? date   TOTAL KNEE ARTHROPLASTY Right 02/22/2013   Procedure: TOTAL KNEE ARTHROPLASTY;  Surgeon: Marcey Raman, MD;  Location: [facility] OR;  Service: Orthopedics;  Laterality: Right;    OB History   No obstetric history on file.      Home Medications    Prior to Admission medications   Medication Sig Start Date End Date Taking? Authorizing Provider  amoxicillin -clavulanate (AUGMENTIN ) 875-125 MG tablet Take 1 tablet by mouth every 12 (twelve) hours. 11/30/23  Yes Trever Streater  N, FNP   benzonatate  (TESSALON ) 100 MG capsule Take 1 capsule (100 mg total) by mouth every 8 (eight) hours. 11/30/23  Yes Darry Kelnhofer  N, FNP  acetaminophen  (TYLENOL ) 500 MG tablet Take 2 tablets (1,000 mg total) by mouth every 6 (six) hours as needed. 03/20/20   Neldon Hamp RAMAN, PA  b complex vitamins tablet Take 1 tablet by mouth daily.    [provider]  Calcium  600-200 MG-UNIT tablet Take 1 tablet by mouth daily.    [provider]  cetirizine  (ZYRTEC ) 10 MG tablet Take 1 tablet (10 mg total) by mouth daily. 09/24/23   Tabori, Katherine E, MD  Coenzyme Q10 100 MG capsule Take 100 mg by mouth daily.     [provider]  ezetimibe  (ZETIA ) 10 MG tablet Take 1 tablet (10 mg total) by mouth every morning. 03/26/23   Tabori, Katherine E, MD  fenofibrate  160 MG tablet Take 1 tablet (160 mg total) by mouth every morning. 09/24/23   Tabori, Katherine E, MD  furosemide  (LASIX ) 20 MG tablet Take 1 tablet by mouth once daily 11/18/23   Tabori, Katherine E, MD  HYDROcodone -acetaminophen  (NORCO/VICODIN) 5-325 MG tablet Take 1 tablet by mouth every 6 (six) hours as needed for moderate pain (pain score 4-6). 05/14/23   Tabori, Katherine E, MD  levothyroxine  (SYNTHROID ) 88 MCG tablet TAKE 1 TABLET BY MOUTH ONCE DAILY BEFORE BREAKFAST 11/18/23   Tabori, Katherine E, MD  losartan  (COZAAR ) 25 MG tablet Take 1 tablet (25 mg total) by mouth daily. 03/26/23   Tabori, Katherine E, MD  metoprolol  succinate (TOPROL -XL) 25 MG 24 hr tablet Take 1 tablet (25 mg total) by mouth daily. 09/24/23   Tabori, Katherine E, MD  neomycin-polymyxin b-dexamethasone  (MAXITROL) 3.5-10000-0.1 OINT  05/19/23   [provider]  omeprazole  (PRILOSEC) 20 MG capsule Take 1 capsule (20 mg total) by mouth every morning. 03/26/23   Tabori, Katherine E, MD  ondansetron  (ZOFRAN ) 4 MG tablet Take 1 tablet (4 mg total) by mouth every 8 (eight) hours as needed for nausea or vomiting. 05/02/21   Tabori, Katherine E, MD   triamcinolone  (NASACORT ) 55 MCG/ACT AERO nasal inhaler Place 2 sprays into the nose daily. 09/24/23   Tabori, Katherine E, MD  triamcinolone  ointment (KENALOG ) 0.1 % Apply 1 Application topically 2 (two) times daily. 09/24/23 09/23/24  Mahlon Comer BRAVO, MD    Family History Family History  Problem Relation Age of Onset   Kidney disease Mother    Heart disease Mother    Hypertension Mother    Varicose Veins Mother    Kidney failure Mother    Alcohol abuse Father    Stroke Father        several    Hypertension Father    Varicose Veins Father    Alcohol abuse Brother  Hyperlipidemia Brother    Hypertension Brother    Early death Brother    Varicose Veins Brother    Bladder Cancer Brother    Heart disease Brother    Hypertension Brother    Arthritis Brother        Gout   Varicose Veins Son    Deep vein thrombosis Son    Colon cancer Son    Breast cancer Maternal Aunt    Pancreatic cancer Neg Hx    Stomach cancer Neg Hx    Esophageal cancer Neg Hx    Liver disease Neg Hx     Social History Social History   Tobacco Use   Smoking status: Never   Smokeless tobacco: Never  Vaping Use   Vaping status: Never Used  Substance Use Topics   Alcohol use: No   Drug use: No     Allergies   Aspirin, Codeine, Statins, and Tizanidine hcl   Review of Systems Review of Systems  Per HPI  Physical Exam Triage Vital Signs ED Triage Vitals [11/30/23 1136]  Encounter Vitals Group     BP (!) 185/78     Girls Systolic BP Percentile      Girls Diastolic BP Percentile      Boys Systolic BP Percentile      Boys Diastolic BP Percentile      Pulse Rate 92     Resp 19     Temp 98 F (36.7 C)     Temp Source Oral     SpO2 90 %     Weight      Height      Head Circumference      Peak Flow      Pain Score      Pain Loc      Pain Education      Exclude from Growth Chart    No data found.  Updated Vital Signs BP (!) 185/78 (BP Location: Right Arm)   Pulse 92   Temp  98 F (36.7 C) (Oral)   Resp 19   SpO2 95%   Visual Acuity Right Eye Distance:   Left Eye Distance:   Bilateral Distance:    Right Eye Near:   Left Eye Near:    Bilateral Near:     Physical Exam Vitals and nursing note reviewed.  Constitutional:      Appearance: Normal appearance.  HENT:     Head: Normocephalic and atraumatic.     Right Ear: External ear normal.     Left Ear: External ear normal.     Nose: Congestion and rhinorrhea present.     Mouth/Throat:     Mouth: Mucous membranes are moist.  Eyes:     Conjunctiva/sclera: Conjunctivae normal.  Cardiovascular:     Rate and Rhythm: Normal rate and regular rhythm.     Heart sounds: Normal heart sounds. No murmur heard. Pulmonary:     Effort: Pulmonary effort is normal. No respiratory distress.     Breath sounds: Normal breath sounds. No wheezing.  Skin:    General: Skin is warm and dry.  Neurological:     General: No focal deficit present.     Mental Status: She is alert and oriented to person, place, and time.  Psychiatric:        Mood and Affect: Mood normal.        Behavior: Behavior normal.      UC Treatments / Results  Labs (all labs ordered are listed, but  only abnormal results are displayed) Labs Reviewed  POC COVID19/FLU A&B COMBO - Normal    EKG   Radiology DG Chest 2 View Result Date: 11/30/2023 EXAM: 2 VIEW(S) XRAY OF THE CHEST 11/30/2023 12:06:45 PM COMPARISON: CT chest, abdomen, and pelvis 05/05/2023 and earlier. CLINICAL HISTORY: 89 year old female. Cough. FINDINGS: LUNGS AND PLEURA: Stable lung volumes and ventilation. No focal pulmonary opacity. No pleural effusion. No pneumothorax. HEART AND MEDIASTINUM: Chronic anterior mediastinal mass does not appear significantly changed radiographically since 2023. See details on CT. No acute abnormality of the cardiac silhouette. BONES AND SOFT TISSUES: No acute osseous abnormality. ABDOMEN (INCIDENTAL FINDINGS): Negative visible bowel gas pattern.  IMPRESSION: 1. No acute cardiopulmonary abnormality. 2. Chronic anterior mediastinal mass, see details on April CT. Electronically signed by: Helayne Hurst MD 11/30/2023 12:13 PM EST RP Workstation: [id number]    Procedures Procedures (including critical care time)  Medications Ordered in UC Medications  dexamethasone  (DECADRON ) injection 10 mg (has no administration in time range)    Initial Impression / Assessment and Plan / UC Course  I have reviewed the triage vital signs and the nursing notes.  Pertinent labs & imaging results that were available during my care of the patient were reviewed by me and considered in my medical decision making (see chart for details).  Vitals and triage reviewed, patient is hemodynamically stable. Lungs vesicular, heart w/ RRR. Congestion present. POC covid and flu testing negative.   CXR obtained, no obvious infiltrate by my interpretation, confirmed with radiology overread.  Does show stable mass.  Symptoms have been present for the past week or so and worsening.  Will cover with Augmentin  for sinusitis and CAP.  Initially oxygenation 90% after ambulating.  Improved to 95% at rest.  Will give IM Decadron  in clinic to help with shortness of breath and lung inflammation.  Encourage primary care follow-up tomorrow if no improvement.  Strict emergency precautions If symptoms evolve or worsen.  Patient verbalized understanding, no questions at this time.     Final Clinical Impressions(s) / UC Diagnoses   Final diagnoses:  Acute cough  Sinusitis, unspecified chronicity, unspecified location     Discharge Instructions      We have given her a steroid injection to help with your shortness of breath.  Please take things easy in the next few days.  Take the antibiotics twice daily with food to help treat any infection.  This will cover for sinus infection and pneumonia.  You can use the cough medicine every 8 hours as needed to suppress cough, this may  not be covered with your insurance.  Ideally your shortness of breath will improve over the next day or so with the steroid.  If you do not have any improvement please follow-up with your primary care provider tomorrow.  If you develop any worsening symptoms seek immediate care at the nearest emergency department.      ED Prescriptions     Medication Sig Dispense Auth. Provider   amoxicillin -clavulanate (AUGMENTIN ) 875-125 MG tablet Take 1 tablet by mouth every 12 (twelve) hours. 14 tablet Dreama, Stuart Guillen  N, FNP   benzonatate  (TESSALON ) 100 MG capsule Take 1 capsule (100 mg total) by mouth every 8 (eight) hours. 21 capsule Dreama, Annabelle Rexroad  N, FNP      PDMP not reviewed this encounter.   Dreama Arlette SAILOR, FNP 11/30/23 1223

## 2023-11-30 NOTE — ED Triage Notes (Signed)
 Pt c/o sinus headache, coughing, sore throat, and body aches for 3-4days.

## 2023-12-01 ENCOUNTER — Ambulatory Visit (INDEPENDENT_AMBULATORY_CARE_PROVIDER_SITE_OTHER): Admitting: Student in an Organized Health Care Education/Training Program

## 2023-12-01 ENCOUNTER — Encounter: Payer: Self-pay | Admitting: Student in an Organized Health Care Education/Training Program

## 2023-12-01 ENCOUNTER — Ambulatory Visit: Payer: Self-pay

## 2023-12-01 VITALS — BP 207/88 | HR 85 | Wt 212.0 lb

## 2023-12-01 DIAGNOSIS — J019 Acute sinusitis, unspecified: Secondary | ICD-10-CM

## 2023-12-01 DIAGNOSIS — F32A Depression, unspecified: Secondary | ICD-10-CM | POA: Diagnosis not present

## 2023-12-01 NOTE — Telephone Encounter (Signed)
 FYI Only or Action Required?: FYI only for provider: appointment scheduled on 12/01/23.  Patient was last seen in primary care on 09/24/2023 by Mahlon Comer BRAVO, MD.  Called Nurse Triage reporting Medication Reaction.  Symptoms began yesterday.  Interventions attempted: Prescription medications: Augmentin  and Tessalon .  Symptoms are: stable.  Triage Disposition: See Physician Within 24 Hours (overriding Call PCP When Office is Open)  Patient/caregiver understands and will follow disposition?: Yes                               1. NAME of MEDICINE: What medicine(s) are you calling about?     Tessalon , prescribed at [facility] yesterday 2. QUESTION: What is your question? (e.g., double dose of medicine, side effect)     Side effects 3. PRESCRIBER: Who prescribed the medicine? Reason: if prescribed by specialist, call should be referred to that group.     UC provider 4. SYMPTOMS: Do you have any symptoms? If Yes, ask: What symptoms are you having?  How bad are the symptoms (e.g., mild, moderate, severe)     Dizziness and headache Denies changes in vision and speech, denies one-sided weakness Denies falls/fainting, denies difficulty breathing, denies chest pain, denies difficulty swallowing, denies swelling of the airway 5. PREGNANCY:  Is there any chance that you are pregnant? When was your last menstrual period?     N/A    Patient went to UC yesterday for sinus symptoms and cough and was prescribed Augmentin  and Tessalon . Patient stated she was advised by UC to follow-up with PCP office. Patient stated she took first dose of medications at 4:30 PM yesterday. Patient stated side effects started at 9 PM yesterday. Patient stated she has not taken either medication since 4:30 PM yesterday. This RN scheduled patient with alternate provider in office for this afternoon to review symptoms. This RN advised patient to hold today's dose of Tessalon , until she  could speak with the provider about it today. Patient has been advised to call back for new or worsening symptoms.   Copied from CRM #[id number]. Topic: Clinical - Red Word Triage >> Dec 01, 2023  8:15 AM Franky GRADE wrote: Red Word that prompted transfer to Nurse Triage: Patient was seen at the urgent care yesterday due to a lingering cough, she was prescribed benzonatate  (TESSALON ) 100 MG capsule [[id number]] and feels like she may be experiencing side effects, dizziness and loss of balance. Reason for Disposition  Caller wants to use a complementary or alternative medicine  Protocols used: Medication Question Call-A-AH
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
## 2023-12-01 NOTE — Assessment & Plan Note (Signed)
 Acute upper respiratory tract infection complicated by acute sinusitis.  Started on Augmentin  by urgent care, only taken 2 doses of it so far and tolerating it well.  I hear crackles on exam at the left lower base today.  She is very frail and struggling at home.  Has some support from her brother.  I recommended against other cough suppressants given her frailty and high risk for falls.  I think coding would pose more risk than benefit.  I recommended continue treatment with Augmentin , complete that course, and supportive care at home.  I recommend she ask her son for more help while she recovers.  She is at risk for development of pneumonia from this respiratory tract infection, and at risk for hospitalization.

## 2023-12-10 ENCOUNTER — Ambulatory Visit: Admitting: Cardiovascular Disease

## 2023-12-17 ENCOUNTER — Ambulatory Visit: Admitting: Cardiovascular Disease

## 2024-01-02 ENCOUNTER — Ambulatory Visit: Admitting: Neurology

## 2024-01-09 ENCOUNTER — Other Ambulatory Visit: Payer: Self-pay | Admitting: Family Medicine

## 2024-01-20 ENCOUNTER — Ambulatory Visit: Payer: Self-pay

## 2024-01-20 NOTE — Telephone Encounter (Signed)
 Patient having leg pain was advised to take Tylenol  and to follow up if necessary

## 2024-01-20 NOTE — Telephone Encounter (Signed)
 FYI Only or Action Required?: FYI only for provider: Home care advised.  Patient was last seen in primary care on 12/01/2023 by Jerrell Cleatus Ned, MD.  Called Nurse Triage reporting Leg Pain.  Symptoms began today.  Interventions attempted: OTC medications: Tylenol .  Symptoms are: gradually worsening.  Triage Disposition: Home Care  Patient/caregiver understands and will follow disposition?: Yes   New onset sore pain this morning in left leg from knee/shin to ankle. Improves with walking, radiates down to ankle. No recent strain or injury. Denies hx of blood clot. No CP or SOB or fever. No redness or swelling or numbness. Has had rash for 5 years on left leg. No changes. Home care advised. Advised to call back for worsening or symptoms lasting longer than 3 days. Advised UC or ED for worsening symptoms.    Copied from [id number]. Topic: Clinical - Red Word Triage >> Jan 20, 2024  3:00 PM Mesmerise C wrote: [facility] that prompted transfer to Nurse Triage: Patient got up this morning stated her left leg from her knee to her ankle feels sore also states she has a rash as well but hurts to walk Reason for Disposition  Leg pain  Answer Assessment - Initial Assessment Questions 1. ONSET: When did the pain start?      This morning  2. LOCATION: Where is the pain located?      Left leg from knee to ankle  3. PAIN: How bad is the pain?    (Scale 1-10; or mild, moderate, severe)     6/10 at worst. Improved after walking, 3/10. Comes and goes  4. WORK OR EXERCISE: Has there been any recent work or exercise that involved this part of the body?      Denies  5. CAUSE: What do you think is causing the leg pain?     *No Answer*  6. OTHER SYMPTOMS: Do you have any other symptoms? (e.g., chest pain, back pain, breathing difficulty, swelling, rash, fever, numbness, weakness)     Chronic rash on left leg, no changes  Protocols used: Leg Pain-A-AH

## 2024-01-26 ENCOUNTER — Ambulatory Visit: Admitting: Cardiovascular Disease

## 2024-03-04 ENCOUNTER — Ambulatory Visit: Admitting: Neurology

## 2024-03-24 ENCOUNTER — Ambulatory Visit: Admitting: Family Medicine
